# Patient Record
Sex: Female | Born: 1965 | Hispanic: No | Marital: Married | Smoking: Former smoker
Health system: Southern US, Community
[De-identification: ages and names within clinical notes are randomized; demographics above are authoritative.]

## PROBLEM LIST (undated history)

## (undated) DIAGNOSIS — Z21 Asymptomatic human immunodeficiency virus [HIV] infection status: Secondary | ICD-10-CM

## (undated) DIAGNOSIS — R519 Headache, unspecified: Secondary | ICD-10-CM

## (undated) DIAGNOSIS — M2550 Pain in unspecified joint: Secondary | ICD-10-CM

## (undated) DIAGNOSIS — M549 Dorsalgia, unspecified: Secondary | ICD-10-CM

## (undated) DIAGNOSIS — F32A Depression, unspecified: Secondary | ICD-10-CM

## (undated) DIAGNOSIS — T7840XA Allergy, unspecified, initial encounter: Secondary | ICD-10-CM

## (undated) DIAGNOSIS — M1990 Unspecified osteoarthritis, unspecified site: Secondary | ICD-10-CM

## (undated) DIAGNOSIS — R634 Abnormal weight loss: Secondary | ICD-10-CM

## (undated) DIAGNOSIS — B20 Human immunodeficiency virus [HIV] disease: Secondary | ICD-10-CM

## (undated) DIAGNOSIS — I2699 Other pulmonary embolism without acute cor pulmonale: Secondary | ICD-10-CM

## (undated) DIAGNOSIS — R51 Headache: Secondary | ICD-10-CM

## (undated) DIAGNOSIS — R7303 Prediabetes: Secondary | ICD-10-CM

## (undated) DIAGNOSIS — J302 Other seasonal allergic rhinitis: Secondary | ICD-10-CM

## (undated) DIAGNOSIS — F419 Anxiety disorder, unspecified: Secondary | ICD-10-CM

## (undated) DIAGNOSIS — F259 Schizoaffective disorder, unspecified: Secondary | ICD-10-CM

## (undated) DIAGNOSIS — N189 Chronic kidney disease, unspecified: Secondary | ICD-10-CM

## (undated) DIAGNOSIS — R2243 Localized swelling, mass and lump, lower limb, bilateral: Secondary | ICD-10-CM

## (undated) DIAGNOSIS — F329 Major depressive disorder, single episode, unspecified: Secondary | ICD-10-CM

## (undated) DIAGNOSIS — G8929 Other chronic pain: Secondary | ICD-10-CM

## (undated) DIAGNOSIS — I639 Cerebral infarction, unspecified: Secondary | ICD-10-CM

## (undated) HISTORY — DX: Asymptomatic human immunodeficiency virus (hiv) infection status: Z21

## (undated) HISTORY — PX: BREAST SURGERY: SHX581

## (undated) HISTORY — DX: Cerebral infarction, unspecified: I63.9

## (undated) HISTORY — DX: Depression, unspecified: F32.A

## (undated) HISTORY — DX: Localized swelling, mass and lump, lower limb, bilateral: R22.43

## (undated) HISTORY — DX: Pain in unspecified joint: M25.50

## (undated) HISTORY — PX: ABDOMINAL HYSTERECTOMY: SHX81

## (undated) HISTORY — PX: BREAST EXCISIONAL BIOPSY: SUR124

## (undated) HISTORY — DX: Human immunodeficiency virus (HIV) disease: B20

## (undated) HISTORY — DX: Allergy, unspecified, initial encounter: T78.40XA

## (undated) HISTORY — DX: Abnormal weight loss: R63.4

## (undated) HISTORY — DX: Major depressive disorder, single episode, unspecified: F32.9

## (undated) HISTORY — DX: Chronic kidney disease, unspecified: N18.9

## (undated) HISTORY — DX: Anxiety disorder, unspecified: F41.9

---

## 2015-09-19 ENCOUNTER — Other Ambulatory Visit (HOSPITAL_COMMUNITY)
Admission: RE | Admit: 2015-09-19 | Discharge: 2015-09-19 | Disposition: A | Discharge status: Home / Self Care | Payer: Medicaid Other | Source: Ambulatory Visit | Attending: Internal Medicine | Admitting: Internal Medicine

## 2015-09-19 ENCOUNTER — Other Ambulatory Visit: Payer: Medicaid Other

## 2015-09-19 DIAGNOSIS — Z113 Encounter for screening for infections with a predominantly sexual mode of transmission: Secondary | ICD-10-CM | POA: Diagnosis not present

## 2015-09-19 DIAGNOSIS — Z79899 Other long term (current) drug therapy: Secondary | ICD-10-CM

## 2015-09-19 DIAGNOSIS — B20 Human immunodeficiency virus [HIV] disease: Secondary | ICD-10-CM

## 2015-09-19 LAB — CBC WITH DIFFERENTIAL/PLATELET
BASOS ABS: 0 10*3/uL (ref 0.0–0.1)
Basophils Relative: 0 % (ref 0–1)
EOS ABS: 0 10*3/uL (ref 0.0–0.7)
EOS PCT: 1 % (ref 0–5)
HEMATOCRIT: 40.6 % (ref 36.0–46.0)
Hemoglobin: 13.7 g/dL (ref 12.0–15.0)
LYMPHS ABS: 1.7 10*3/uL (ref 0.7–4.0)
LYMPHS PCT: 36 % (ref 12–46)
MCH: 31.3 pg (ref 26.0–34.0)
MCHC: 33.7 g/dL (ref 30.0–36.0)
MCV: 92.7 fL (ref 78.0–100.0)
MONO ABS: 0.4 10*3/uL (ref 0.1–1.0)
MONOS PCT: 8 % (ref 3–12)
MPV: 9.7 fL (ref 8.6–12.4)
Neutro Abs: 2.6 10*3/uL (ref 1.7–7.7)
Neutrophils Relative %: 55 % (ref 43–77)
PLATELETS: 250 10*3/uL (ref 150–400)
RBC: 4.38 MIL/uL (ref 3.87–5.11)
RDW: 13.4 % (ref 11.5–15.5)
WBC: 4.8 10*3/uL (ref 4.0–10.5)

## 2015-09-19 LAB — COMPLETE METABOLIC PANEL WITH GFR
ALT: 12 U/L (ref 6–29)
AST: 13 U/L (ref 10–35)
Albumin: 4.3 g/dL (ref 3.6–5.1)
Alkaline Phosphatase: 49 U/L (ref 33–115)
BUN: 10 mg/dL (ref 7–25)
CALCIUM: 9.4 mg/dL (ref 8.6–10.2)
CHLORIDE: 104 mmol/L (ref 98–110)
CO2: 24 mmol/L (ref 20–31)
CREATININE: 0.99 mg/dL (ref 0.50–1.10)
GFR, EST AFRICAN AMERICAN: 78 mL/min (ref >=60)
GFR, EST NON AFRICAN AMERICAN: 68 mL/min (ref >=60)
Glucose, Bld: 78 mg/dL (ref 65–99)
Potassium: 3.9 mmol/L (ref 3.5–5.3)
Sodium: 141 mmol/L (ref 135–146)
Total Bilirubin: 0.6 mg/dL (ref 0.2–1.2)
Total Protein: 6.9 g/dL (ref 6.1–8.1)

## 2015-09-19 LAB — LIPID PANEL
CHOLESTEROL: 190 mg/dL (ref 125–200)
HDL: 85 mg/dL (ref >=46)
LDL Cholesterol: 88 mg/dL (ref <130)
TRIGLYCERIDES: 84 mg/dL (ref <150)
Total CHOL/HDL Ratio: 2.2 Ratio (ref <=5.0)
VLDL: 17 mg/dL (ref <30)

## 2015-09-20 LAB — URINE CYTOLOGY ANCILLARY ONLY
Chlamydia: NEGATIVE
Neisseria Gonorrhea: NEGATIVE

## 2015-09-20 LAB — T-HELPER CELL (CD4) - (RCID CLINIC ONLY)
CD4 % Helper T Cell: 54 % (ref 33–55)
CD4 T Cell Abs: 980 /uL (ref 400–2700)

## 2015-09-20 LAB — RPR

## 2015-09-21 LAB — QUANTIFERON TB GOLD ASSAY (BLOOD)
Interferon Gamma Release Assay: NEGATIVE
Mitogen-Nil: >10 IU/mL
Quantiferon Nil Value: 0.05 IU/mL
Quantiferon Tb Ag Minus Nil Value: 0 IU/mL

## 2015-09-22 LAB — HIV-1 RNA ULTRAQUANT REFLEX TO GENTYP+
HIV 1 RNA Quant: <20 copies/mL (ref <20)
HIV-1 RNA Quant, Log: <1.3 Log copies/mL (ref <1.30)

## 2015-09-26 LAB — HLA B*5701: HLA-B*5701 w/rflx HLA-B High: NEGATIVE

## 2015-10-03 ENCOUNTER — Encounter: Payer: Self-pay | Admitting: Internal Medicine

## 2015-10-03 ENCOUNTER — Ambulatory Visit (INDEPENDENT_AMBULATORY_CARE_PROVIDER_SITE_OTHER): Payer: Medicaid Other | Admitting: Internal Medicine

## 2015-10-03 VITALS — BP 110/78 | HR 81 | Temp 98.0°F | Ht 67.5 in | Wt 193.0 lb

## 2015-10-03 DIAGNOSIS — B20 Human immunodeficiency virus [HIV] disease: Secondary | ICD-10-CM | POA: Diagnosis not present

## 2015-10-03 DIAGNOSIS — F251 Schizoaffective disorder, depressive type: Secondary | ICD-10-CM | POA: Diagnosis not present

## 2015-10-03 DIAGNOSIS — G894 Chronic pain syndrome: Secondary | ICD-10-CM | POA: Insufficient documentation

## 2015-10-03 DIAGNOSIS — F259 Schizoaffective disorder, unspecified: Secondary | ICD-10-CM | POA: Insufficient documentation

## 2015-10-03 DIAGNOSIS — Z720 Tobacco use: Secondary | ICD-10-CM

## 2015-10-03 DIAGNOSIS — F17210 Nicotine dependence, cigarettes, uncomplicated: Secondary | ICD-10-CM | POA: Insufficient documentation

## 2015-10-03 DIAGNOSIS — F17200 Nicotine dependence, unspecified, uncomplicated: Secondary | ICD-10-CM | POA: Insufficient documentation

## 2015-10-03 MED ORDER — TOPIRAMATE 100 MG PO TABS: 100.0000 mg | ORAL_TABLET | Freq: Every day | ORAL | Status: DC

## 2015-10-03 MED ORDER — DARUNAVIR-COBICISTAT 800-150 MG PO TABS: 1.0000 | ORAL_TABLET | Freq: Every day | ORAL | Status: DC

## 2015-10-03 MED ORDER — EMTRICITABINE-TENOFOVIR AF 200-25 MG PO TABS: 1.0000 | ORAL_TABLET | Freq: Every day | ORAL | Status: DC

## 2015-10-03 MED ORDER — BUPROPION HCL ER (XL) 300 MG PO TB24: 300.0000 mg | ORAL_TABLET | Freq: Every day | ORAL | Status: DC

## 2015-10-03 MED ORDER — QUETIAPINE FUMARATE ER 400 MG PO TB24: 400.0000 mg | ORAL_TABLET | Freq: Every day | ORAL | Status: DC

## 2015-10-03 MED FILL — DESCOVY 200-25 MG TABS: 200-25 | 30 days supply | Qty: 30 | Fill #0

## 2015-10-03 NOTE — Progress Notes (Signed)
Patient ID: Tracy [name], female   DOB: December 16, 1965, 50 y.o.   MRN: [id number]  HPI: Tracy [name] is a 50 y.o. female who is here for her HIV visit after [facility]  Allergies: Allergies  Allergen Reactions  . Tetracyclines & Related Itching  . Tramadol Itching  . Flexeril [Cyclobenzaprine] Rash  . Moxifloxacin Rash    Vitals: Temp: 98 F (36.7 C) (03/23 1345) Temp Source: Oral (03/23 1345) BP: 110/78 mmHg (03/23 1345) Pulse Rate: 81 (03/23 1345)  Past Medical History: Past Medical History  Diagnosis Date  . HIV infection (HCC)   . Depression     Social History: Social History   Social History  . Marital Status: Married    Spouse Name: N/A  . Number of Children: N/A  . Years of Education: N/A   Social History Main Topics  . Smoking status: Current Every Day Smoker -- 0.30 packs/day    Types: Cigarettes  . Smokeless tobacco: Never Used  . Alcohol Use: No     Comment: quit 6.2007  . Drug Use: No     Comment: Previous drug use  . Sexual Activity: Not Asked     Comment: declined condoms    Other Topics Concern  . None   Social History Narrative  . None    Previous Regimen: EPZ, KLT, Trizivir, TDF  Current Regimen: TRV/Prez  Labs: HIV 1 RNA QUANT (copies/mL)  Date Value  09/19/2015 <20   CD4 T CELL ABS (/uL)  Date Value  09/19/2015 980    CrCl: Estimated Creatinine Clearance: 79.8 mL/min (by C-G formula based on Cr of 0.99).  Lipids:    Component Value Date/Time   CHOL 190 09/19/2015 1207   TRIG 84 09/19/2015 1207   HDL 85 09/19/2015 1207   CHOLHDL 2.2 09/19/2015 1207   VLDL 17 09/19/2015 1207   LDLCALC 88 09/19/2015 1207    Assessment: Tracy [name] is here for her initial visit after transferring [facility] care [facility]. She did admit to a hx of non-compliance. 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 She is doing pretty well on the current regimen. After  sitting down with her, we noticed that she is on Seroquel 800mg . This can be boosted severely with the cobicistat. We are going to reduce the dose to 400mg  and keep it there. Secondly, she also has a hx of migraines but she thought that the Topamax was only PRN. Straighten her out about taking it everyday.   Recommendations:  Continue Prezcobix/Descovy Meds profile is clean up Reduce the Seroquel to 400mg  PO qhs Medication calendar  Tracy [name], Tracy [name], PharmD Clinical Infectious Disease Pharmacist [facility] 10/03/2015, 3:11 PM

## 2015-10-04 NOTE — Assessment & Plan Note (Signed)
She is feeling overwhelmed and depressed. She was very interested when I told her that mental health counseling is available in our clinic. She will be sent out to meet with one of our behavioral health counselors.

## 2015-10-04 NOTE — Assessment & Plan Note (Signed)
She expresses that she is currently not ready to quit smoking cigarettes.
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
## 2015-10-04 NOTE — Assessment & Plan Note (Addendum)
Her HIV infection remains under excellent control but she is having difficulty with adherence related to feeling overwhelmed and depressed. She also is having some degree of pill fatigue. I will change Truvada to Descovy and continue Prezcobix. We will change her pharmacy to the [place] outpatient pharmacy mail order service. This will make it easier for her to obtain her medications and also allow us to track her adherence. I had her meet with our ID pharmacist, Ulyses SouthwardMinh Pham for a complete review of her medications with emphasis on any drug drug interactions and unnecessary medications that could be stopped. She will followup with me in 6 weeks. At that time her next blood work she will need to have hepatitis C antibody checked. She is nonimmune to hepatitis B and will need to start a repeat vaccination series. She her husband have not been using condoms. I talked to her about the concern that she could be superinfected with his HIV infection and that could potentially complicate her therapy. She did also become infected with hepatitis C.

## 2015-10-28 MED FILL — DESCOVY 200-25 MG TABS: 200-25 | 30 days supply | Qty: 30 | Fill #1

## 2015-10-28 MED FILL — PREZCOBIX 800 MG-150 MG TAB: 800-150 | 30 days supply | Qty: 30 | Fill #0 | Status: TO

## 2015-11-21 ENCOUNTER — Ambulatory Visit: Payer: Medicaid Other | Admitting: Internal Medicine

## 2015-11-26 MED FILL — DESCOVY 200-25 MG TABS: 200-25 | 30 days supply | Qty: 30 | Fill #2 | Status: TO

## 2015-12-26 ENCOUNTER — Ambulatory Visit: Payer: Medicaid Other | Admitting: *Deleted

## 2015-12-26 ENCOUNTER — Encounter: Payer: Self-pay | Admitting: Internal Medicine

## 2015-12-26 ENCOUNTER — Ambulatory Visit (INDEPENDENT_AMBULATORY_CARE_PROVIDER_SITE_OTHER): Payer: Medicaid Other | Admitting: Internal Medicine

## 2015-12-26 VITALS — BP 118/85 | HR 91 | Temp 99.3°F | Ht 67.5 in | Wt 194.0 lb

## 2015-12-26 DIAGNOSIS — B20 Human immunodeficiency virus [HIV] disease: Secondary | ICD-10-CM | POA: Diagnosis not present

## 2015-12-26 DIAGNOSIS — F251 Schizoaffective disorder, depressive type: Secondary | ICD-10-CM

## 2015-12-26 DIAGNOSIS — L299 Pruritus, unspecified: Secondary | ICD-10-CM

## 2015-12-26 DIAGNOSIS — L298 Other pruritus: Secondary | ICD-10-CM | POA: Insufficient documentation

## 2015-12-26 DIAGNOSIS — T50905A Adverse effect of unspecified drugs, medicaments and biological substances, initial encounter: Principal | ICD-10-CM

## 2015-12-26 MED ORDER — EMTRICITAB-RILPIVIR-TENOFOV DF 200-25-300 MG PO TABS: 1.0000 | ORAL_TABLET | Freq: Every day | ORAL | Status: DC

## 2015-12-26 MED ORDER — DOLUTEGRAVIR SODIUM 50 MG PO TABS: 50.0000 mg | ORAL_TABLET | Freq: Every day | ORAL | Status: DC

## 2015-12-26 MED FILL — TIVICAY 50 MG TABLET: 50 | 30 days supply | Qty: 30 | Fill #0

## 2015-12-26 MED FILL — COMPLERA TABLET: 200-25-300 | 30 days supply | Qty: 30 | Fill #0

## 2015-12-26 NOTE — BH Specialist Note (Signed)
Zariya requested speaking to counselor today.  Counselor met with patient in the exam room for a warm handoff.  Patient was oriented times four with good affect and dress. Patient shared that she is doing a little better but really needed to be speaking to a counselor on a regular basis.  Patient stated that she goes through bout's of depression and she doesn't want to leave her home. Patient indicated that she feels that taking the medications and being HIV positive has affected her emotions/attitude in a negative way. Counselor provided support and encouragement for patient.  Counselor recommended that patient make an appointment when she checks out today.    /[facility] 

## 2015-12-27 LAB — T-HELPER CELL (CD4) - (RCID CLINIC ONLY)
CD4 T CELL HELPER: 54 % (ref 33–55)
CD4 T Cell Abs: 810 /uL (ref 400–2700)

## 2015-12-27 LAB — HIV-1 RNA QUANT-NO REFLEX-BLD
HIV 1 RNA QUANT: 37 {copies}/mL — AB (ref <20)
HIV-1 RNA QUANT, LOG: 1.57 {Log_copies}/mL — AB (ref <1.30)

## 2015-12-27 NOTE — Assessment & Plan Note (Signed)
Her adherence has improved. I will repeat lab work today. It appears that she has developed an allergic reaction with itching after switching to Descovy. This suggests that she is allergic to the TAF component of Descovy. I reviewed the situation with our infectious disease pharmacist. We decided to switch her to Complera and Tivicay. She will follow-up in 6 weeks.

## 2015-12-27 NOTE — Progress Notes (Signed)
Patient Active Problem List   Diagnosis Date Noted  . HIV disease (HCC) 10/03/2015    Priority: High  . Itching due to drug 12/26/2015  . Schizoaffective disorder (HCC) 10/03/2015  . Chronic pain syndrome 10/03/2015  . Cigarette smoker 10/03/2015    Patient's Medications  New Prescriptions   DOLUTEGRAVIR (TIVICAY) 50 MG TABLET    Take 1 tablet (50 mg total) by mouth daily.   DOLUTEGRAVIR (TIVICAY) 50 MG TABLET    Take 1 tablet (50 mg total) by mouth daily.   EMTRICITABINE-RILPIVIR-TENOFOVIR DF (COMPLERA) 200-25-300 MG TABLET    Take 1 tablet by mouth daily.   EMTRICITABINE-RILPIVIR-TENOFOVIR DF (COMPLERA) 200-25-300 MG TABLET    Take 1 tablet by mouth daily.  Previous Medications   BUPROPION (WELLBUTRIN XL) 300 MG 24 HR TABLET    Take 1 tablet (300 mg total) by mouth daily.   LORATADINE (CLARITIN) 10 MG TABLET    Take 10 mg by mouth daily.   MULTIPLE VITAMIN (MULTIVITAMIN) TABLET    Take 1 tablet by mouth daily.   OXYCODONE HCL 10 MG TABS    Take 10 mg by mouth 3 (three) times daily as needed.   QUETIAPINE (SEROQUEL XR) 400 MG 24 HR TABLET    Take 1 tablet (400 mg total) by mouth at bedtime.   SUMATRIPTAN (IMITREX) 100 MG TABLET    Take 100 mg by mouth every 2 (two) hours as needed for migraine. May repeat in 2 hours if headache persists or recurs.   TOPIRAMATE (TOPAMAX) 100 MG TABLET    Take 1 tablet (100 mg total) by mouth daily.  Modified Medications   No medications on file  Discontinued Medications   DARUNAVIR-COBICISTAT (PREZCOBIX) 800-150 MG TABLET    Take 1 tablet by mouth daily. Swallow whole. Do NOT crush, break or chew tablets. Take with food.   EMTRICITABINE-TENOFOVIR AF (DESCOVY) 200-25 MG TABLET    Take 1 tablet by mouth daily.   IPRATROPIUM (ATROVENT HFA) 17 MCG/ACT INHALER    Inhale 2 puffs into the lungs as needed for wheezing.   METHOCARBAMOL (ROBAXIN) 750 MG TABLET    Take 750 mg by mouth daily.    Subjective: Tracy [name] is in with her husband,  Windy Fast, for her routine HIV follow-up visit. She states that she has done much better taking her HIV medications since her last visit 3 months ago. However, after she switched from Truvada 2 Descovy she began to develop diffuse itching. It is most noticeable at night. She is continuing to take her Prezcobix. She states that she is no longer feeling as depressed. She is down to only 2 cigarettes daily but has not thought about quitting completely.  Review of Systems: Review of Systems  Constitutional: Negative for fever, chills, weight loss, malaise/fatigue and diaphoresis.  HENT: Negative for sore throat.   Respiratory: Negative for cough, sputum production and shortness of breath.   Cardiovascular: Negative for chest pain.  Gastrointestinal: Negative for nausea, vomiting, abdominal pain and diarrhea.  Genitourinary: Negative for dysuria.  Musculoskeletal: Negative for myalgias and joint pain.  Skin: Positive for itching. Negative for rash.  Neurological: Negative for dizziness and headaches.  Psychiatric/Behavioral: Negative for depression and substance abuse. The patient is not nervous/anxious.     Past Medical History  Diagnosis Date  . HIV infection (HCC)   . Depression     Social History  Substance Use Topics  . Smoking status: Current Every Day Smoker -- 0.30  packs/day    Types: Cigarettes  . Smokeless tobacco: Never Used     Comment: cutting back  . Alcohol Use: No     Comment: quit 6.2007    Family History  Problem Relation Age of Onset  . Diabetes Mother   . Hypertension Mother     Allergies  Allergen Reactions  . Tetracyclines & Related Itching  . Tramadol Itching  . Flexeril [Cyclobenzaprine] Rash  . Moxifloxacin Rash    Objective:  Filed Vitals:   12/26/15 1101  BP: 118/85  Pulse: 91  Temp: 99.3 F (37.4 C)  TempSrc: Oral  Height: 5' 7.5" (1.715 m)  Weight: 194 lb (87.998 kg)   Body mass index is 29.92 kg/(m^2).  Physical Exam  Constitutional:  She is oriented to person, place, and time.  She is alert and in only mild distress due to itching.  HENT:  Mouth/Throat: No oropharyngeal exudate.  Eyes: Conjunctivae are normal.  Cardiovascular: Normal rate and regular rhythm.   No murmur heard. Pulmonary/Chest: Breath sounds normal.  Abdominal: Soft. There is no tenderness.  Musculoskeletal: Normal range of motion.  Neurological: She is alert and oriented to person, place, and time.  Skin: No rash noted.  Few, minor excoriations.  Psychiatric: Mood and affect normal.    Lab Results Lab Results  Component Value Date   WBC 4.8 09/19/2015   HGB 13.7 09/19/2015   HCT 40.6 09/19/2015   MCV 92.7 09/19/2015   PLT 250 09/19/2015    Lab Results  Component Value Date   CREATININE 0.99 09/19/2015   BUN 10 09/19/2015   NA 141 09/19/2015   K 3.9 09/19/2015   CL 104 09/19/2015   CO2 24 09/19/2015    Lab Results  Component Value Date   ALT 12 09/19/2015   AST 13 09/19/2015   ALKPHOS 49 09/19/2015   BILITOT 0.6 09/19/2015    Lab Results  Component Value Date   CHOL 190 09/19/2015   HDL 85 09/19/2015   LDLCALC 88 09/19/2015   TRIG 84 09/19/2015   CHOLHDL 2.2 09/19/2015    Lab Results HIV 1 RNA QUANT (copies/mL)  Date Value  09/19/2015 <20   CD4 T CELL ABS (/uL)  Date Value  09/19/2015 980      Problem List Items Addressed This Visit      High   HIV disease (HCC)    Her adherence has improved. I will repeat lab work today. It appears that she has developed an allergic reaction with itching after switching to Descovy. This suggests that she is allergic to the TAF component of Descovy. I reviewed the situation with our infectious disease pharmacist. We decided to switch her to Complera and Tivicay. She will follow-up in 6 weeks.      Relevant Medications   dolutegravir (TIVICAY) 50 MG tablet   emtricitabine-rilpivir-tenofovir DF (COMPLERA) 200-25-300 MG tablet   emtricitabine-rilpivir-tenofovir DF (COMPLERA)  200-25-300 MG tablet (Start on 01/20/2016)   dolutegravir (TIVICAY) 50 MG tablet (Start on 01/20/2016)   Other Relevant Orders   T-helper cell (CD4)- ([facility] clinic only)   HIV 1 RNA quant-no reflex-bld     Unprioritized   Itching due to drug - Primary    See above.      Schizoaffective disorder (HCC)    Her depression is improving and this has led to improved adherence with her medications.           Cliffton AstersJohn Yancarlos Berthold, MD [facility]  [phone] pager   [phone] cell 12/27/2015, 10:40 AM

## 2015-12-27 NOTE — Assessment & Plan Note (Signed)
Her depression is improving and this has led to improved adherence with her medications.

## 2015-12-27 NOTE — Assessment & Plan Note (Signed)
See above

## 2016-02-10 ENCOUNTER — Ambulatory Visit (INDEPENDENT_AMBULATORY_CARE_PROVIDER_SITE_OTHER): Payer: Medicaid Other | Admitting: Internal Medicine

## 2016-02-10 DIAGNOSIS — B20 Human immunodeficiency virus [HIV] disease: Secondary | ICD-10-CM | POA: Diagnosis present

## 2016-02-10 DIAGNOSIS — F251 Schizoaffective disorder, depressive type: Secondary | ICD-10-CM

## 2016-02-10 DIAGNOSIS — L853 Xerosis cutis: Secondary | ICD-10-CM | POA: Insufficient documentation

## 2016-02-10 DIAGNOSIS — L850 Acquired ichthyosis: Secondary | ICD-10-CM | POA: Diagnosis not present

## 2016-02-10 NOTE — Assessment & Plan Note (Signed)
She remains depressed but does not want to meet with our behavioral health counselor today. I gave her Lennox Laity Herring's card and encouraged her to consider calling to set up a follow-up appointment.

## 2016-02-10 NOTE — Assessment & Plan Note (Signed)
I asked her to try over-the-counter, low potency steroid cream.

## 2016-02-10 NOTE — Assessment & Plan Note (Signed)
Her infection is under good control with her current regimen. She will follow-up after lab work in 3 months.

## 2016-02-10 NOTE — Progress Notes (Signed)
Patient Active Problem List   Diagnosis Date Noted  . HIV disease (HCC) 10/03/2015    Priority: High  . Dry skin dermatitis 02/10/2016  . Itching due to drug 12/26/2015  . Schizoaffective disorder (HCC) 10/03/2015  . Chronic pain syndrome 10/03/2015  . Cigarette smoker 10/03/2015    Patient's Medications  New Prescriptions   No medications on file  Previous Medications   BUPROPION (WELLBUTRIN XL) 300 MG 24 HR TABLET    Take 1 tablet (300 mg total) by mouth daily.   DOLUTEGRAVIR (TIVICAY) 50 MG TABLET    Take 1 tablet (50 mg total) by mouth daily.   EMTRICITABINE-RILPIVIR-TENOFOVIR DF (COMPLERA) 200-25-300 MG TABLET    Take 1 tablet by mouth daily.   LORATADINE (CLARITIN) 10 MG TABLET    Take 10 mg by mouth daily.   MULTIPLE VITAMIN (MULTIVITAMIN) TABLET    Take 1 tablet by mouth daily.   OXYCODONE HCL 10 MG TABS    Take 10 mg by mouth 3 (three) times daily as needed.   QUETIAPINE (SEROQUEL XR) 400 MG 24 HR TABLET    Take 1 tablet (400 mg total) by mouth at bedtime.   SUMATRIPTAN (IMITREX) 100 MG TABLET    Take 100 mg by mouth every 2 (two) hours as needed for migraine. May repeat in 2 hours if headache persists or recurs.   TOPIRAMATE (TOPAMAX) 100 MG TABLET    Take 1 tablet (100 mg total) by mouth daily.  Modified Medications   No medications on file  Discontinued Medications   DOLUTEGRAVIR (TIVICAY) 50 MG TABLET    Take 1 tablet (50 mg total) by mouth daily.   EMTRICITABINE-RILPIVIR-TENOFOVIR DF (COMPLERA) 200-25-300 MG TABLET    Take 1 tablet by mouth daily.    Subjective: Tracy [name] is in for her routine HIV follow-up visit. Her itching stop promptly after she changed it to her new regimen of Complera and Tivicay. I suspect that her itching and rash were due to TAF. She has not been missing doses of her medications. She has a little bit of dryness, cracking and itching between the webs of her finger and on her hips that is different. She is tired of driving back and  forth to [facility] to see her PCP and would like to get a PCP here in [place]. She tells me that if she is not busy cleaning her house she gets depressed.  Review of Systems: Review of Systems  Constitutional: Negative for chills, diaphoresis, fever, malaise/fatigue and weight loss.  HENT: Negative for sore throat.   Respiratory: Negative for cough, sputum production and shortness of breath.   Cardiovascular: Negative for chest pain.  Gastrointestinal: Negative for diarrhea, nausea and vomiting.  Genitourinary: Negative for dysuria and frequency.  Musculoskeletal: Negative for joint pain and myalgias.  Skin: Positive for itching and rash.  Neurological: Negative for dizziness and headaches.  Psychiatric/Behavioral: Positive for depression. Negative for substance abuse. The patient is not nervous/anxious.     Past Medical History:  Diagnosis Date  . Depression   . HIV infection [facility])     Social History  Substance Use Topics  . Smoking status: Current Every Day Smoker    Packs/day: 0.30    Types: Cigarettes  . Smokeless tobacco: Never Used     Comment: cutting back  . Alcohol use No     Comment: quit 6.2007    Family History  Problem Relation Age of Onset  . Diabetes  Mother   . Hypertension Mother     Allergies  Allergen Reactions  . Tetracyclines & Related Itching  . Tramadol Itching  . Flexeril [Cyclobenzaprine] Rash  . Moxifloxacin Rash    Objective:  Vitals:   02/10/16 1450  BP: 111/79  Pulse: 85  Temp: 98.3 F (36.8 C)  TempSrc: Oral  Weight: 196 lb (88.9 kg)  Height: 5' 7.5" (1.715 m)   Body mass index is 30.24 kg/m.  Physical Exam  Constitutional: She is oriented to person, place, and time.  She is in good spirits.  HENT:  Mouth/Throat: No oropharyngeal exudate.  Eyes: Conjunctivae are normal.  Cardiovascular: Normal rate and regular rhythm.   No murmur heard. Pulmonary/Chest: Breath sounds normal.  Abdominal: Soft. She exhibits no mass.  There is no tenderness.  Musculoskeletal: Normal range of motion.  Neurological: She is alert and oriented to person, place, and time.  Skin: Rash noted.  She has some dry patches in the webspace between several fingers on both hands.  Psychiatric: Mood and affect normal.    Lab Results Lab Results  Component Value Date   WBC 4.8 09/19/2015   HGB 13.7 09/19/2015   HCT 40.6 09/19/2015   MCV 92.7 09/19/2015   PLT 250 09/19/2015    Lab Results  Component Value Date   CREATININE 0.99 09/19/2015   BUN 10 09/19/2015   NA 141 09/19/2015   K 3.9 09/19/2015   CL 104 09/19/2015   CO2 24 09/19/2015    Lab Results  Component Value Date   ALT 12 09/19/2015   AST 13 09/19/2015   ALKPHOS 49 09/19/2015   BILITOT 0.6 09/19/2015    Lab Results  Component Value Date   CHOL 190 09/19/2015   HDL 85 09/19/2015   LDLCALC 88 09/19/2015   TRIG 84 09/19/2015   CHOLHDL 2.2 09/19/2015   HIV 1 RNA Quant (copies/mL)  Date Value  12/26/2015 37 (H)  09/19/2015 <20   CD4 T Cell Abs (/uL)  Date Value  12/26/2015 810  09/19/2015 980     Problem List Items Addressed This Visit      High   HIV disease (HCC)    Her infection is under good control with her current regimen. She will follow-up after lab work in 3 months.      Relevant Orders   T-helper cell (CD4)- ([facility] clinic only)   HIV 1 RNA quant-no reflex-bld     Unprioritized   Dry skin dermatitis    I asked her to try over-the-counter, low potency steroid cream.      Schizoaffective disorder (HCC)    She remains depressed but does not want to meet with our behavioral health counselor today. I gave her Lennox Laity Herring's card and encouraged her to consider calling to set up a follow-up appointment.       Other Visit Diagnoses   None.       Cliffton Asters, MD [facility] [phone] pager   [phone] cell 02/10/2016, 5:23 PM

## 2016-05-03 ENCOUNTER — Emergency Department (HOSPITAL_COMMUNITY)
Admission: EM | Admit: 2016-05-03 | Discharge: 2016-05-03 | Disposition: A | Discharge status: Home / Self Care | Payer: Medicaid Other | Attending: Emergency Medicine | Admitting: Emergency Medicine

## 2016-05-03 ENCOUNTER — Encounter (HOSPITAL_COMMUNITY): Payer: Self-pay

## 2016-05-03 DIAGNOSIS — F17210 Nicotine dependence, cigarettes, uncomplicated: Secondary | ICD-10-CM | POA: Diagnosis not present

## 2016-05-03 DIAGNOSIS — L02211 Cutaneous abscess of abdominal wall: Secondary | ICD-10-CM | POA: Insufficient documentation

## 2016-05-03 MED ORDER — SULFAMETHOXAZOLE-TRIMETHOPRIM 800-160 MG PO TABS
1.0000 | ORAL_TABLET | Freq: Once | ORAL | Status: AC
  Administered 2016-05-03: 1 via ORAL
  Filled 2016-05-03: qty 1

## 2016-05-03 MED ORDER — ACETAMINOPHEN 500 MG PO TABS
1000.0000 mg | ORAL_TABLET | Freq: Once | ORAL | Status: AC
  Administered 2016-05-03: 1000 mg via ORAL
  Filled 2016-05-03: qty 2

## 2016-05-03 MED ORDER — LIDOCAINE-EPINEPHRINE 1 %-1:100000 IJ SOLN
10.0000 mL | Freq: Once | INTRAMUSCULAR | Status: AC
  Administered 2016-05-03: 10 mL via INTRADERMAL
  Filled 2016-05-03: qty 1

## 2016-05-03 MED ORDER — SULFAMETHOXAZOLE-TRIMETHOPRIM 800-160 MG PO TABS: 1.0000 | ORAL_TABLET | Freq: Two times a day (BID) | ORAL | 0 refills | Status: AC

## 2016-05-03 NOTE — ED Provider Notes (Signed)
[facility]-EMERGENCY DEPT Provider Note   CSN: [id number] Arrival date & time: 05/03/16  1218     History   Chief Complaint Chief Complaint  Patient presents with  . abscess to abdomen    HPI Tracy [name] is a 50 y.o. female.  The history is provided by the patient.  Abscess  Location:  Torso Torso abscess location:  Abd LLQ Size:  4 cm Abscess quality: fluctuance, induration, painful, redness and warmth   Abscess quality: not draining   Red streaking: yes   Duration:  1 week Progression:  Worsening Pain details:    Quality:  Throbbing   Severity:  Severe   Timing:  Constant   Progression:  Worsening Chronicity:  Recurrent Context: immunosuppression   Relieved by:  Nothing Exacerbated by: touch. Associated symptoms: no fever, no nausea and no vomiting   Risk factors: hx of MRSA and prior abscess    H/o HIV on HAART; last CD4 810 in June. H/o MRSA.  Past Medical History:  Diagnosis Date  . Depression   . HIV infection [facility])     Patient Active Problem List   Diagnosis Date Noted  . Dry skin dermatitis 02/10/2016  . Itching due to drug 12/26/2015  . HIV disease (HCC) 10/03/2015  . Schizoaffective disorder (HCC) 10/03/2015  . Chronic pain syndrome 10/03/2015  . Cigarette smoker 10/03/2015    Past Surgical History:  Procedure Laterality Date  . ABDOMINAL HYSTERECTOMY    . BREAST SURGERY      OB History    No data available       Home Medications    Prior to Admission medications   Medication Sig Start Date End Date Taking? Authorizing Provider  buPROPion (WELLBUTRIN XL) 300 MG 24 hr tablet Take 1 tablet (300 mg total) by mouth daily. 10/03/15  Yes Cliffton Asters, MD  dolutegravir (TIVICAY) 50 MG tablet Take 1 tablet (50 mg total) by mouth daily. 01/20/16  Yes Cliffton Asters, MD  emtricitabine-rilpivir-tenofovir DF (COMPLERA) 200-25-300 MG tablet Take 1 tablet by mouth daily. 12/26/15  Yes Cliffton Asters, MD  loratadine (CLARITIN) 10 MG tablet Take 10 mg  by mouth daily.   Yes Historical Provider, MD  Multiple Vitamin (MULTIVITAMIN) tablet Take 1 tablet by mouth daily.   Yes Historical Provider, MD  Oxycodone HCl 10 MG TABS Take 10 mg by mouth 3 (three) times daily as needed.   Yes Historical Provider, MD  QUEtiapine (SEROQUEL XR) 400 MG 24 hr tablet Take 1 tablet (400 mg total) by mouth at bedtime. 10/03/15  Yes Cliffton Asters, MD  topiramate (TOPAMAX) 100 MG tablet Take 1 tablet (100 mg total) by mouth daily. 10/03/15  Yes Cliffton Asters, MD  sulfamethoxazole-trimethoprim (BACTRIM DS,SEPTRA DS) 800-160 MG tablet Take 1 tablet by mouth 2 (two) times daily. 05/03/16 05/08/16  Nira Conn, MD  SUMAtriptan (IMITREX) 100 MG tablet Take 100 mg by mouth every 2 (two) hours as needed for migraine. May repeat in 2 hours if headache persists or recurs.    Historical Provider, MD    Family History Family History  Problem Relation Age of Onset  . Diabetes Mother   . Hypertension Mother     Social History Social History  Substance Use Topics  . Smoking status: Current Every Day Smoker    Packs/day: 0.30    Types: Cigarettes  . Smokeless tobacco: Never Used     Comment: cutting back  . Alcohol use No     Comment: quit 6.2007  Allergies   Tetracyclines & related; Tramadol; Flexeril [cyclobenzaprine]; and Moxifloxacin   Review of Systems Review of Systems  Constitutional: Negative for chills and fever.  HENT: Negative for ear pain and sore throat.   Eyes: Negative for pain and visual disturbance.  Respiratory: Negative for cough and shortness of breath.   Cardiovascular: Negative for chest pain and palpitations.  Gastrointestinal: Negative for abdominal pain, nausea and vomiting.  Genitourinary: Negative for dysuria and hematuria.  Musculoskeletal: Negative for arthralgias and back pain.  Skin: Negative for color change and rash.  Neurological: Negative for seizures and syncope.  All other systems reviewed and are  negative.    Physical Exam Updated Vital Signs BP 119/75   Pulse 87   Temp 98.3 F (36.8 C) (Oral)   Resp 22   SpO2 98%   Physical Exam  Constitutional: She is oriented to person, place, and time. She appears well-developed and well-nourished. No distress.  HENT:  Head: Normocephalic and atraumatic.  Right Ear: External ear normal.  Left Ear: External ear normal.  Nose: Nose normal.  Eyes: Conjunctivae and EOM are normal. No scleral icterus.  Neck: Normal range of motion and phonation normal.  Cardiovascular: Normal rate and regular rhythm.   Pulmonary/Chest: Effort normal. No stridor. No respiratory distress.  Abdominal: She exhibits no distension.    Musculoskeletal: Normal range of motion. She exhibits no edema.  Neurological: She is alert and oriented to person, place, and time.  Skin: She is not diaphoretic.  Psychiatric: She has a normal mood and affect. Her behavior is normal.  Vitals reviewed.    ED Treatments / Results  Labs (all labs ordered are listed, but only abnormal results are displayed) Labs Reviewed - No data to display  EKG  EKG Interpretation None       Radiology No results found.  Procedures .Marland KitchenIncision and Drainage Date/Time: 05/03/2016 3:26 PM Performed by: Nira Conn Authorized by: Nira Conn   Consent:    Consent obtained:  Verbal   Risks discussed:  Bleeding, incomplete drainage, pain and infection   Alternatives discussed:  No treatment Location:    Type:  Abscess   Size:  2 cm   Location:  Trunk   Trunk location:  Abdomen Pre-procedure details:    Skin preparation:  Betadine Anesthesia (see MAR for exact dosages):    Anesthesia method:  Local infiltration   Local anesthetic:  Lidocaine 1% WITH epi Procedure type:    Complexity:  Complex Procedure details:    Needle aspiration: no     Incision type: cruciate.   Incision depth:  Subcutaneous   Scalpel blade:  11   Wound management:  Probed  and deloculated   Drainage:  Purulent   Drainage amount:  Moderate   Wound treatment:  Wound left open   Packing materials:  1/4 in gauze and 1/4 in iodoform gauze Post-procedure details:    Patient tolerance of procedure:  Tolerated well, no immediate complications   (including critical care time) Emergency Focused Ultrasound Exam Limited Ultrasound of Soft Tissue   Performed and interpreted by Dr. Eudelia Bunch Indication: evaluation for infection or foreign body Transverse and Sagittal views of left Abd wall are obtained in real time for the purposes of evaluation of skin and underlying soft tissues.  Findings: + heterogeneous fluid collection, + hyperemia/edema of surrounding tissue Interpretation: abscess with overlying cellulitis  Images archived electronically.  CPT Codes:  Abdominal wall [address]-60   Medications Ordered in ED Medications  lidocaine-EPINEPHrine (XYLOCAINE  W/EPI) 1 %-1:100000 (with pres) injection 10 mL (not administered)  acetaminophen (TYLENOL) tablet 1,000 mg (1,000 mg Oral Given 05/03/16 1257)  sulfamethoxazole-trimethoprim (BACTRIM DS,SEPTRA DS) 800-160 MG per tablet 1 tablet (1 tablet Oral Given 05/03/16 1332)     Initial Impression / Assessment and Plan / ED Course  I have reviewed the triage vital signs and the nursing notes.  Pertinent labs & imaging results that were available during my care of the patient were reviewed by me and considered in my medical decision making (see chart for details).  Clinical Course    Abd wall abscess with overlying cellulitis with h/o MRSA and well controlled HIV. Incision and drainage as above. Packing placed. Patient given Bactrim with a history of MRSA. Safe for discharge with strict return precautions. We'll prescribe Bactrim.  Final Clinical Impressions(s) / ED Diagnoses   Final diagnoses:  Abdominal wall abscess   Disposition: Discharge  Condition: Good  I have discussed the results, Dx and Tx plan with  the patient who expressed understanding and agree(s) with the plan. Discharge instructions discussed at great length. The patient was given strict return precautions who verbalized understanding of the instructions. No further questions at time of discharge.    New Prescriptions   SULFAMETHOXAZOLE-TRIMETHOPRIM (BACTRIM DS,SEPTRA DS) 800-160 MG TABLET    Take 1 tablet by mouth 2 (two) times daily.    [facility] [address] [place] [id number] [phone] Call  For help establishing care with a care provider  [facility] [address] [id number] [place] [phone] Go in 2 days For wound re-check      Nira ConnPedro Eduardo Savannaha Stonerock, MD 05/03/16 1530

## 2016-05-03 NOTE — ED Triage Notes (Signed)
Patient here with to LLQ of abdomen x 1 week. Pain and redness without drainage, hx of same. Denies fever.

## 2016-05-25 ENCOUNTER — Encounter: Payer: Self-pay | Admitting: Internal Medicine

## 2016-05-25 ENCOUNTER — Ambulatory Visit (INDEPENDENT_AMBULATORY_CARE_PROVIDER_SITE_OTHER): Payer: Medicaid Other | Admitting: Internal Medicine

## 2016-05-25 VITALS — BP 116/78 | HR 88 | Temp 98.4°F | Ht 67.5 in | Wt 194.5 lb

## 2016-05-25 DIAGNOSIS — N644 Mastodynia: Secondary | ICD-10-CM | POA: Diagnosis not present

## 2016-05-25 DIAGNOSIS — Z23 Encounter for immunization: Secondary | ICD-10-CM

## 2016-05-25 DIAGNOSIS — B20 Human immunodeficiency virus [HIV] disease: Secondary | ICD-10-CM

## 2016-05-25 LAB — CBC
HCT: 37.5 % (ref 35.0–45.0)
Hemoglobin: 12.6 g/dL (ref 11.7–15.5)
MCH: 31.7 pg (ref 27.0–33.0)
MCHC: 33.6 g/dL (ref 32.0–36.0)
MCV: 94.2 fL (ref 80.0–100.0)
MPV: 9.6 fL (ref 7.5–12.5)
PLATELETS: 263 10*3/uL (ref 140–400)
RBC: 3.98 MIL/uL (ref 3.80–5.10)
RDW: 14.3 % (ref 11.0–15.0)
WBC: 5.4 10*3/uL (ref 3.8–10.8)

## 2016-05-25 LAB — COMPREHENSIVE METABOLIC PANEL
ALT: 12 U/L (ref 6–29)
AST: 14 U/L (ref 10–35)
Albumin: 4.3 g/dL (ref 3.6–5.1)
Alkaline Phosphatase: 58 U/L (ref 33–115)
BUN: 12 mg/dL (ref 7–25)
CALCIUM: 9.3 mg/dL (ref 8.6–10.2)
CHLORIDE: 115 mmol/L — AB (ref 98–110)
CO2: 22 mmol/L (ref 20–31)
Creat: 1.15 mg/dL — ABNORMAL HIGH (ref 0.50–1.10)
GLUCOSE: 81 mg/dL (ref 65–99)
POTASSIUM: 4.1 mmol/L (ref 3.5–5.3)
Sodium: 142 mmol/L (ref 135–146)
Total Bilirubin: 0.6 mg/dL (ref 0.2–1.2)
Total Protein: 6.8 g/dL (ref 6.1–8.1)

## 2016-05-25 NOTE — Assessment & Plan Note (Signed)
Her depression is currently in remission. 

## 2016-05-25 NOTE — Progress Notes (Signed)
Patient Active Problem List   Diagnosis Date Noted  . HIV disease (HCC) 10/03/2015    Priority: High  . Breast pain, left 05/25/2016  . Dry skin dermatitis 02/10/2016  . Itching due to drug 12/26/2015  . Schizoaffective disorder (HCC) 10/03/2015  . Chronic pain syndrome 10/03/2015  . Cigarette smoker 10/03/2015    Patient's Medications  New Prescriptions   No medications on file  Previous Medications   BUPROPION (WELLBUTRIN XL) 300 MG 24 HR TABLET    Take 1 tablet (300 mg total) by mouth daily.   DOLUTEGRAVIR (TIVICAY) 50 MG TABLET    Take 1 tablet (50 mg total) by mouth daily.   EMTRICITABINE-RILPIVIR-TENOFOVIR DF (COMPLERA) 200-25-300 MG TABLET    Take 1 tablet by mouth daily.   FERROUS SULFATE 325 (65 FE) MG TABLET    Take 325 mg by mouth daily with breakfast.   LORATADINE (CLARITIN) 10 MG TABLET    Take 10 mg by mouth daily.   MULTIPLE VITAMIN (MULTIVITAMIN) TABLET    Take 1 tablet by mouth daily.   OXYCODONE HCL 10 MG TABS    Take 10 mg by mouth 3 (three) times daily as needed.   QUETIAPINE (SEROQUEL XR) 400 MG 24 HR TABLET    Take 1 tablet (400 mg total) by mouth at bedtime.   SUMATRIPTAN (IMITREX) 100 MG TABLET    Take 100 mg by mouth every 2 (two) hours as needed for migraine. May repeat in 2 hours if headache persists or recurs.   TOPIRAMATE (TOPAMAX) 100 MG TABLET    Take 1 tablet (100 mg total) by mouth daily.  Modified Medications   No medications on file  Discontinued Medications   No medications on file    Subjective: Tracy [name] is in for her routine HIV follow-up visit. She has had no problems obtaining, taking or tolerating her Complera and Tivicay. Her rash resolved after she started this regimen. She stopped eating meat several months ago and has more energy and is feeling better. She has had a little bit of tenderness in her left breast recently. She has not felt any lumps. She has had previous surgery on her right breast with 2 biopsies revealing  benign lesions. She has not had a mammogram in about 6 years. She recently had a boil lanced and was treated with trimethoprim sulfamethoxazole for several days. That has resolved. She is not feeling depressed currently.   Review of Systems: Review of Systems  Constitutional: Negative for chills, diaphoresis, fever, malaise/fatigue and weight loss.  HENT: Negative for sore throat.   Respiratory: Negative for cough, sputum production and shortness of breath.   Cardiovascular: Negative for chest pain.  Gastrointestinal: Negative for diarrhea, nausea and vomiting.  Genitourinary: Negative for dysuria and frequency.  Musculoskeletal: Negative for joint pain and myalgias.  Skin: Negative for rash.  Neurological: Negative for dizziness and headaches.  Psychiatric/Behavioral: Negative for depression and substance abuse. The patient is not nervous/anxious.     Past Medical History:  Diagnosis Date  . Depression   . HIV infection [facility](HCC)     Social History  Substance Use Topics  . Smoking status: Former Smoker    Packs/day: 0.30    Types: Cigarettes    Quit date: 04/26/2016  . Smokeless tobacco: Never Used     Comment: cutting back  . Alcohol use No     Comment: quit 6.2007    Family History  Problem Relation Age of  Onset  . Diabetes Mother   . Hypertension Mother     Allergies  Allergen Reactions  . Tetracyclines & Related Itching  . Tramadol Itching  . Flexeril [Cyclobenzaprine] Rash  . Moxifloxacin Rash    Objective:  Vitals:   05/25/16 1521  BP: 116/78  Pulse: 88  Temp: 98.4 F (36.9 C)  TempSrc: Oral  Weight: 194 lb 8 oz (88.2 kg)  Height: 5' 7.5" (1.715 m)   Body mass index is 30.01 kg/m.  Physical Exam  Constitutional: She is oriented to person, place, and time.  She is in good spirits.  HENT:  Mouth/Throat: No oropharyngeal exudate.  Eyes: Conjunctivae are normal.  Cardiovascular: Normal rate and regular rhythm.   No murmur heard. Pulmonary/Chest:  Effort normal and breath sounds normal.    Abdominal: Soft. She exhibits no mass. There is no tenderness.  Musculoskeletal: Normal range of motion.  Neurological: She is alert and oriented to person, place, and time.  Skin: No rash noted.  Psychiatric: Mood and affect normal.    Lab Results Lab Results  Component Value Date   WBC 4.8 09/19/2015   HGB 13.7 09/19/2015   HCT 40.6 09/19/2015   MCV 92.7 09/19/2015   PLT 250 09/19/2015    Lab Results  Component Value Date   CREATININE 0.99 09/19/2015   BUN 10 09/19/2015   NA 141 09/19/2015   K 3.9 09/19/2015   CL 104 09/19/2015   CO2 24 09/19/2015    Lab Results  Component Value Date   ALT 12 09/19/2015   AST 13 09/19/2015   ALKPHOS 49 09/19/2015   BILITOT 0.6 09/19/2015    Lab Results  Component Value Date   CHOL 190 09/19/2015   HDL 85 09/19/2015   LDLCALC 88 09/19/2015   TRIG 84 09/19/2015   CHOLHDL 2.2 09/19/2015   HIV 1 RNA Quant (copies/mL)  Date Value  12/26/2015 37 (H)  09/19/2015 <20   CD4 T Cell Abs (/uL)  Date Value  12/26/2015 810  09/19/2015 980     Problem List Items Addressed This Visit      High   HIV disease (HCC)    Her adherence is excellent and her infection has been under good control. She is tolerating her current regimen and I will continue it. She will get repeat lab work today and pneumococcal vaccine.      Relevant Orders   T-helper cell (CD4)- ([facility] clinic only)   HIV 1 RNA quant-no reflex-bld   CBC   Comprehensive metabolic panel     Unprioritized   Breast pain, left    I do not see any cause for her recent breast discomfort. We will schedule her for a mammogram.       Other Visit Diagnoses    Need for prophylactic vaccination against Streptococcus pneumoniae (pneumococcus)    -  Primary   Relevant Orders   Pneumococcal polysaccharide vaccine 23-valent greater than or equal to 2yo subcutaneous/IM   Human immunodeficiency virus I infection (HCC)       Relevant Orders     Pneumococcal polysaccharide vaccine 23-valent greater than or equal to 2yo subcutaneous/IM        Cliffton AstersJohn Donell Sliwinski, MD [facility] [phone] pager   [phone] cell 05/25/2016, 3:51 PM

## 2016-05-25 NOTE — Assessment & Plan Note (Signed)
I do not see any cause for her recent breast discomfort. We will schedule her for a mammogram.

## 2016-05-25 NOTE — Assessment & Plan Note (Signed)
Her adherence is excellent and her infection has been under good control. She is tolerating her current regimen and I will continue it. She will get repeat lab work today and pneumococcal vaccine.

## 2016-05-25 NOTE — Addendum Note (Signed)
Addended by: Jennet MaduroESTRIDGE, DENISE D on: 05/25/2016 05:42 PM   Modules accepted: Orders

## 2016-05-26 NOTE — Addendum Note (Signed)
Addended by: Jennet MaduroESTRIDGE, Issiah Huffaker D on: 05/26/2016 10:26 AM   Modules accepted: Orders

## 2016-05-27 LAB — T-HELPER CELL (CD4) - (RCID CLINIC ONLY)
CD4 T CELL ABS: 1160 /uL (ref 400–2700)
CD4 T CELL HELPER: 51 % (ref 33–55)

## 2016-05-28 LAB — HIV-1 RNA QUANT-NO REFLEX-BLD: HIV-1 RNA Quant, Log: <1.3 Log copies/mL (ref <1.30)

## 2016-05-31 ENCOUNTER — Emergency Department (HOSPITAL_COMMUNITY)
Admission: EM | Admit: 2016-05-31 | Discharge: 2016-05-31 | Disposition: A | Discharge status: Home / Self Care | Payer: Medicaid Other | Attending: Physician Assistant | Admitting: Physician Assistant

## 2016-05-31 ENCOUNTER — Encounter (HOSPITAL_COMMUNITY): Payer: Self-pay

## 2016-05-31 DIAGNOSIS — Z79899 Other long term (current) drug therapy: Secondary | ICD-10-CM | POA: Diagnosis not present

## 2016-05-31 DIAGNOSIS — F17210 Nicotine dependence, cigarettes, uncomplicated: Secondary | ICD-10-CM | POA: Diagnosis not present

## 2016-05-31 DIAGNOSIS — L02211 Cutaneous abscess of abdominal wall: Secondary | ICD-10-CM | POA: Diagnosis not present

## 2016-05-31 DIAGNOSIS — L0889 Other specified local infections of the skin and subcutaneous tissue: Secondary | ICD-10-CM | POA: Diagnosis present

## 2016-05-31 MED ORDER — OXYCODONE HCL 5 MG PO TABS
10.0000 mg | ORAL_TABLET | Freq: Once | ORAL | Status: AC
  Administered 2016-05-31: 10 mg via ORAL
  Filled 2016-05-31: qty 2

## 2016-05-31 MED ORDER — SULFAMETHOXAZOLE-TRIMETHOPRIM 800-160 MG PO TABS: 1.0000 | ORAL_TABLET | Freq: Two times a day (BID) | ORAL | 0 refills | Status: AC

## 2016-05-31 MED ORDER — CEPHALEXIN 500 MG PO CAPS: 500.0000 mg | ORAL_CAPSULE | Freq: Four times a day (QID) | ORAL | 0 refills | Status: DC

## 2016-05-31 MED ORDER — LIDOCAINE-EPINEPHRINE (PF) 2 %-1:200000 IJ SOLN
10.0000 mL | Freq: Once | INTRAMUSCULAR | Status: AC
  Administered 2016-05-31: 10 mL
  Filled 2016-05-31: qty 20

## 2016-05-31 NOTE — ED Provider Notes (Signed)
[facility]-EMERGENCY DEPT Provider Note   CSN: [id number] Arrival date & time: 05/31/16  1818     History   Chief Complaint Chief Complaint  Patient presents with  . Wound Infection    HPI Tracy [name] is a 50 y.o. female with a hx of HIV on HAART, chronic pain syndrome, schizoaffective disorder presents to the Emergency Department complaining of gradual, persistent, progressively worsening LLQ abd pain with drainage from the site onset 2 days ago. Patient reports associated pain and swelling at the site along with erythema. Purulent drainage since this morning. Patient reports this is the same site that required an incision and drainage one month ago. She reports complete healing in between with close follow-up with her infectious disease doctor.  Patient denies fever, chills, nausea, vomiting, abdominal pain.  She does endorse for bouts of watery diarrhea today. No abdominal pain or fevers. No hematochezia or melena. No history of C. difficile.   The history is provided by the patient and medical records. No language interpreter was used.    Past Medical History:  Diagnosis Date  . Depression   . HIV infection [facility](HCC)     Patient Active Problem List   Diagnosis Date Noted  . Breast pain, left 05/25/2016  . Dry skin dermatitis 02/10/2016  . Itching due to drug 12/26/2015  . HIV disease (HCC) 10/03/2015  . Schizoaffective disorder (HCC) 10/03/2015  . Chronic pain syndrome 10/03/2015  . Cigarette smoker 10/03/2015    Past Surgical History:  Procedure Laterality Date  . ABDOMINAL HYSTERECTOMY    . BREAST SURGERY      OB History    No data available       Home Medications    Prior to Admission medications   Medication Sig Start Date End Date Taking? Authorizing Provider  buPROPion (WELLBUTRIN XL) 300 MG 24 hr tablet Take 1 tablet (300 mg total) by mouth daily. 10/03/15   Cliffton AstersJohn Campbell, MD  cephALEXin (KEFLEX) 500 MG capsule Take 1 capsule (500 mg total) by mouth 4  (four) times daily. 05/31/16   Nanami Whitelaw, PA-C  dolutegravir (TIVICAY) 50 MG tablet Take 1 tablet (50 mg total) by mouth daily. 01/20/16   Cliffton AstersJohn Campbell, MD  emtricitabine-rilpivir-tenofovir DF (COMPLERA) 200-25-300 MG tablet Take 1 tablet by mouth daily. 12/26/15   Cliffton AstersJohn Campbell, MD  ferrous sulfate 325 (65 FE) MG tablet Take 325 mg by mouth daily with breakfast.    Historical Provider, MD  loratadine (CLARITIN) 10 MG tablet Take 10 mg by mouth daily.    Historical Provider, MD  Multiple Vitamin (MULTIVITAMIN) tablet Take 1 tablet by mouth daily.    Historical Provider, MD  Oxycodone HCl 10 MG TABS Take 10 mg by mouth 3 (three) times daily as needed.    Historical Provider, MD  QUEtiapine (SEROQUEL XR) 400 MG 24 hr tablet Take 1 tablet (400 mg total) by mouth at bedtime. 10/03/15   Cliffton AstersJohn Campbell, MD  sulfamethoxazole-trimethoprim (BACTRIM DS,SEPTRA DS) 800-160 MG tablet Take 1 tablet by mouth 2 (two) times daily. 05/31/16 06/07/16  Jaymen Fetch, PA-C  SUMAtriptan (IMITREX) 100 MG tablet Take 100 mg by mouth every 2 (two) hours as needed for migraine. May repeat in 2 hours if headache persists or recurs.    Historical Provider, MD  topiramate (TOPAMAX) 100 MG tablet Take 1 tablet (100 mg total) by mouth daily. 10/03/15   Cliffton AstersJohn Campbell, MD    Family History Family History  Problem Relation Age of Onset  . Diabetes Mother   .  Hypertension Mother     Social History Social History  Substance Use Topics  . Smoking status: Current Some Day Smoker    Packs/day: 0.30    Types: Cigarettes    Last attempt to quit: 04/26/2016  . Smokeless tobacco: Never Used     Comment: cutting back  . Alcohol use No     Comment: quit 6.2007     Allergies   Tetracyclines & related; Tramadol; Flexeril [cyclobenzaprine]; and Moxifloxacin   Review of Systems Review of Systems  Gastrointestinal: Positive for diarrhea.  Skin: Positive for wound.  All other systems reviewed and are  negative.    Physical Exam Updated Vital Signs BP 134/89   Pulse 88   Temp 98.2 F (36.8 C) (Oral)   Resp 16   Ht 5' 7.5" (1.715 m)   Wt 89.5 kg   SpO2 99%   BMI 30.46 kg/m   Physical Exam  Constitutional: She is oriented to person, place, and time. She appears well-developed and well-nourished. No distress.  HENT:  Head: Normocephalic and atraumatic.  Eyes: Conjunctivae are normal. No scleral icterus.  Neck: Normal range of motion.  Cardiovascular: Normal rate, regular rhythm and intact distal pulses.   Pulmonary/Chest: Effort normal and breath sounds normal.  Abdominal: Soft. She exhibits no distension. There is no tenderness.  5x5cm area of erythema and induration to the LLQ of the abd with purulent drainage.    Lymphadenopathy:    She has no cervical adenopathy.  Neurological: She is alert and oriented to person, place, and time.  Skin: Skin is warm and dry. She is not diaphoretic. There is erythema.  Psychiatric: She has a normal mood and affect.  Nursing note and vitals reviewed.    ED Treatments / Results  Labs (all labs ordered are listed, but only abnormal results are displayed) Labs Reviewed - No data to display  EKG  EKG Interpretation None       Radiology No results found.  Procedures .Marland Kitchen.Incision and Drainage Date/Time: 05/31/2016 11:19 PM Performed by: Dierdre ForthMUTHERSBAUGH, Olsen Mccutchan Authorized by: Dierdre ForthMUTHERSBAUGH, Aarya Robinson   Consent:    Consent obtained:  Verbal   Consent given by:  Patient   Risks discussed:  Bleeding, incomplete drainage and pain   Alternatives discussed:  No treatment (or abx alone) Location:    Type:  Abscess   Location:  Trunk   Trunk location:  Abdomen Pre-procedure details:    Skin preparation:  Betadine Anesthesia (see MAR for exact dosages):    Anesthesia method:  Local infiltration   Local anesthetic:  Lidocaine 2% WITH epi (5mL) Procedure type:    Complexity:  Simple Procedure details:    Needle aspiration: no      Incision types:  Single straight   Incision depth:  Dermal   Scalpel blade:  11   Wound management:  Probed and deloculated and irrigated with saline   Drainage:  Bloody and purulent   Drainage amount:  Moderate   Wound treatment:  Wound left open   Packing materials:  None Post-procedure details:    Patient tolerance of procedure:  Tolerated well, no immediate complications   (including critical care time)  EMERGENCY DEPARTMENT US SOFT TISSUE INTERPRETATION "Study: Limited Ultrasound of the noted body part in comments below"  INDICATIONS: Pain and Soft tissue infection Multiple views of the body part are obtained with a multi-frequency linear probe  PERFORMED BY:  Myself  IMAGES ARCHIVED?: Yes  SIDE:Left  BODY PART:Abdominal wall  FINDINGS: Abcess present and Cellulitis  present  LIMITATIONS:  Body Habitus  INTERPRETATION:  Abcess present and Cellulitis present  COMMENT:  Small abscess noted with cobblestoning of surrounding tissue.    Medications Ordered in ED Medications  lidocaine-EPINEPHrine (XYLOCAINE W/EPI) 2 %-1:200000 (PF) injection 10 mL (10 mLs Infiltration Given 05/31/16 2219)  oxyCODONE (Oxy IR/ROXICODONE) immediate release tablet 10 mg (10 mg Oral Given 05/31/16 2218)     Initial Impression / Assessment and Plan / ED Course  I have reviewed the triage vital signs and the nursing notes.  Pertinent labs & imaging results that were available during my care of the patient were reviewed by me and considered in my medical decision making (see chart for details).  Clinical Course as of Jun 01 2331  Wynelle Link May 31, 2016  2110 H/o HIV on HAART; last CD4 1,160 in November.  HIV Quant < 20 at that time. H/o MRSA.  [HM]    Clinical Course User Index [HM] Dierdre Forth, PA-C   Patient with skin abscess amenable to incision and drainage.  Abscess was not large enough to warrant packing or drain,  wound recheck in 2 days. Encouraged home warm soaks and  flushing.  Mild signs of cellulitis is surrounding skin.  Will d/c to home.  Patient with complete resolution on Bactrim. Will discharge with Bactrim and Keflex. She is to have 48 hour follow-up with PCP for wound check. Discussed reasons to return to the emergency department including fevers chills or abdominal pain.  Discussed with patient the importance of close follow-up for her diarrhea as well as. She is alert and oriented. Well hydrated. Doubt C. difficile at this time. Asked patient to discuss with her primary care/infectious disease physician if symptoms persist for the next several days. Her abdomen is soft and nontender aside from the site where the abscess is located.  Final Clinical Impressions(s) / ED Diagnoses   Final diagnoses:  Abdominal wall abscess    New Prescriptions Discharge Medication List as of 05/31/2016 11:02 PM    START taking these medications   Details  cephALEXin (KEFLEX) 500 MG capsule Take 1 capsule (500 mg total) by mouth 4 (four) times daily., Starting Sun 05/31/2016, Print    sulfamethoxazole-trimethoprim (BACTRIM DS,SEPTRA DS) 800-160 MG tablet Take 1 tablet by mouth 2 (two) times daily., Starting Sun 05/31/2016, Until Sun 06/07/2016, Print         Clarice Bonaventure, PA-C 05/31/16 2332    Courteney Randall An, MD 05/31/16 6045

## 2016-05-31 NOTE — ED Notes (Signed)
EDP at bedside  

## 2016-05-31 NOTE — ED Notes (Signed)
Pt verbalized understanding discharge instructions and denies any further needs or questions at this time. VS stable, ambulatory and steady gait.   

## 2016-05-31 NOTE — ED Notes (Addendum)
Pt had an abrasion on the LLQ of her abdomen. Pt states she had it drained last month here in this ED.  Pt states the MD gave her antibiotics which she finished and was told to keep the wound covered and let it heal. Pt states she has been fine up until two days ago. Pt c/o 10/10 pain is requesting her "percocet". She gets the narcotic for chronic abdominal pain.

## 2016-05-31 NOTE — Discharge Instructions (Signed)
1. Medications: bactrim, keflex, usual home medications 2. Treatment: rest, drink plenty of fluids, use warm compresses, flush abscess with warm water several times per day 3. Follow Up: Please followup with your primary doctor in 2 days for discussion of your diagnoses and further evaluation after today's visit; if you do not have a primary care doctor use the resource guide provided to find one; Please return to the ER for fevers, chills, nausea, vomiting or other signs of infection

## 2016-05-31 NOTE — ED Triage Notes (Addendum)
Pt had abdominal wall abscess in Oct 2017.  Onset 2 days ago wound has reddened with bloody drainage.  No fever.  Diarrhea x 2 days, watery, x 4 today.   Sore throat x 2 days.

## 2016-06-15 ENCOUNTER — Other Ambulatory Visit: Payer: Self-pay | Admitting: Internal Medicine

## 2016-06-16 ENCOUNTER — Telehealth: Payer: Self-pay | Admitting: *Deleted

## 2016-06-16 NOTE — Telephone Encounter (Signed)
I have not been prescribing these medications. [facility] Please ask her who has been prescribing these. [facility] see if [facility] can be [facility]. I would be willing to give her a 3 month supply of those medicines if there is a gap in her care.

## 2016-06-16 NOTE — Telephone Encounter (Signed)
Refill requests of seroquel 400mg  and welbutrin 300mg   Please advise if you are managing these medications. Andree CossHowell, Apollonia Amini M, RN

## 2016-06-17 NOTE — Telephone Encounter (Signed)
Called PCP's office to forward the refill request.

## 2016-06-22 ENCOUNTER — Other Ambulatory Visit: Payer: Medicaid Other

## 2016-07-09 ENCOUNTER — Telehealth: Payer: Self-pay | Admitting: Lab

## 2016-07-09 NOTE — Telephone Encounter (Signed)
Patient was scheduled to have both done on 06/22/16-Both appointments were cancelled

## 2016-07-14 NOTE — Telephone Encounter (Signed)
Do you know who canceled the procedures? Could just see if they can be rescheduled? Thanks.

## 2016-08-20 NOTE — Telephone Encounter (Signed)
Yes and She was a NO SHOW.

## 2016-10-01 NOTE — Addendum Note (Signed)
Addended by: Jennet MaduroESTRIDGE, DENISE D on: 10/01/2016 04:58 PM   Modules accepted: Orders

## 2016-10-02 ENCOUNTER — Other Ambulatory Visit: Payer: Medicaid Other

## 2016-10-05 ENCOUNTER — Ambulatory Visit
Admission: RE | Admit: 2016-10-05 | Discharge: 2016-10-05 | Disposition: A | Discharge status: Home / Self Care | Payer: Medicaid Other | Source: Ambulatory Visit | Attending: Internal Medicine | Admitting: Internal Medicine

## 2016-10-05 DIAGNOSIS — N644 Mastodynia: Secondary | ICD-10-CM

## 2016-12-11 ENCOUNTER — Other Ambulatory Visit: Payer: Self-pay | Admitting: Internal Medicine

## 2016-12-17 ENCOUNTER — Ambulatory Visit: Payer: Medicaid Other | Admitting: Internal Medicine

## 2016-12-29 ENCOUNTER — Emergency Department (HOSPITAL_COMMUNITY): Payer: Medicaid Other

## 2016-12-29 ENCOUNTER — Inpatient Hospital Stay (HOSPITAL_COMMUNITY)
Admission: EM | Admit: 2016-12-29 | Discharge: 2016-12-31 | DRG: 880 | Disposition: A | Discharge status: Home / Self Care | Payer: Medicaid Other | Attending: Internal Medicine | Admitting: Internal Medicine

## 2016-12-29 ENCOUNTER — Encounter (HOSPITAL_COMMUNITY): Payer: Self-pay

## 2016-12-29 ENCOUNTER — Inpatient Hospital Stay (HOSPITAL_COMMUNITY): Payer: Medicaid Other

## 2016-12-29 DIAGNOSIS — F17210 Nicotine dependence, cigarettes, uncomplicated: Secondary | ICD-10-CM | POA: Diagnosis present

## 2016-12-29 DIAGNOSIS — R51 Headache: Secondary | ICD-10-CM

## 2016-12-29 DIAGNOSIS — E669 Obesity, unspecified: Secondary | ICD-10-CM | POA: Diagnosis present

## 2016-12-29 DIAGNOSIS — I663 Occlusion and stenosis of cerebellar arteries: Secondary | ICD-10-CM | POA: Diagnosis present

## 2016-12-29 DIAGNOSIS — M5412 Radiculopathy, cervical region: Secondary | ICD-10-CM

## 2016-12-29 DIAGNOSIS — Z792 Long term (current) use of antibiotics: Secondary | ICD-10-CM

## 2016-12-29 DIAGNOSIS — F449 Dissociative and conversion disorder, unspecified: Secondary | ICD-10-CM

## 2016-12-29 DIAGNOSIS — G894 Chronic pain syndrome: Secondary | ICD-10-CM | POA: Diagnosis present

## 2016-12-29 DIAGNOSIS — F259 Schizoaffective disorder, unspecified: Secondary | ICD-10-CM | POA: Diagnosis present

## 2016-12-29 DIAGNOSIS — I639 Cerebral infarction, unspecified: Secondary | ICD-10-CM | POA: Diagnosis present

## 2016-12-29 DIAGNOSIS — Z79899 Other long term (current) drug therapy: Secondary | ICD-10-CM

## 2016-12-29 DIAGNOSIS — B20 Human immunodeficiency virus [HIV] disease: Secondary | ICD-10-CM | POA: Diagnosis present

## 2016-12-29 DIAGNOSIS — R519 Headache, unspecified: Secondary | ICD-10-CM

## 2016-12-29 DIAGNOSIS — Z6830 Body mass index (BMI) 30.0-30.9, adult: Secondary | ICD-10-CM | POA: Diagnosis not present

## 2016-12-29 DIAGNOSIS — R531 Weakness: Secondary | ICD-10-CM | POA: Diagnosis present

## 2016-12-29 DIAGNOSIS — R4701 Aphasia: Secondary | ICD-10-CM | POA: Diagnosis present

## 2016-12-29 DIAGNOSIS — F17200 Nicotine dependence, unspecified, uncomplicated: Secondary | ICD-10-CM | POA: Diagnosis present

## 2016-12-29 DIAGNOSIS — I360 Nonrheumatic tricuspid (valve) stenosis: Secondary | ICD-10-CM | POA: Diagnosis not present

## 2016-12-29 DIAGNOSIS — G44021 Chronic cluster headache, intractable: Secondary | ICD-10-CM | POA: Diagnosis not present

## 2016-12-29 HISTORY — DX: Dorsalgia, unspecified: M54.9

## 2016-12-29 HISTORY — DX: Headache: R51

## 2016-12-29 HISTORY — DX: Headache, unspecified: R51.9

## 2016-12-29 HISTORY — DX: Other chronic pain: G89.29

## 2016-12-29 HISTORY — DX: Schizoaffective disorder, unspecified: F25.9

## 2016-12-29 LAB — COMPREHENSIVE METABOLIC PANEL
ALT: 17 U/L (ref 14–54)
ANION GAP: 9 (ref 5–15)
AST: 22 U/L (ref 15–41)
Albumin: 3.8 g/dL (ref 3.5–5.0)
Alkaline Phosphatase: 63 U/L (ref 38–126)
BUN: 7 mg/dL (ref 6–20)
CHLORIDE: 108 mmol/L (ref 101–111)
CO2: 22 mmol/L (ref 22–32)
Calcium: 9.1 mg/dL (ref 8.9–10.3)
Creatinine, Ser: 1.08 mg/dL — ABNORMAL HIGH (ref 0.44–1.00)
GFR, EST NON AFRICAN AMERICAN: 59 mL/min — AB (ref >60)
Glucose, Bld: 109 mg/dL — ABNORMAL HIGH (ref 65–99)
POTASSIUM: 3.8 mmol/L (ref 3.5–5.1)
SODIUM: 139 mmol/L (ref 135–145)
Total Bilirubin: 0.6 mg/dL (ref 0.3–1.2)
Total Protein: 6.3 g/dL — ABNORMAL LOW (ref 6.5–8.1)

## 2016-12-29 LAB — DIFFERENTIAL
BASOS PCT: 0 %
Basophils Absolute: 0 10*3/uL (ref 0.0–0.1)
EOS ABS: 0.1 10*3/uL (ref 0.0–0.7)
Eosinophils Relative: 1 %
Lymphocytes Relative: 31 %
Lymphs Abs: 1.9 10*3/uL (ref 0.7–4.0)
MONO ABS: 0.5 10*3/uL (ref 0.1–1.0)
MONOS PCT: 9 %
NEUTROS ABS: 3.6 10*3/uL (ref 1.7–7.7)
Neutrophils Relative %: 59 %

## 2016-12-29 LAB — I-STAT CHEM 8, ED
BUN: 7 mg/dL (ref 6–20)
CALCIUM ION: 1.09 mmol/L — AB (ref 1.15–1.40)
CHLORIDE: 106 mmol/L (ref 101–111)
Creatinine, Ser: 1 mg/dL (ref 0.44–1.00)
Glucose, Bld: 105 mg/dL — ABNORMAL HIGH (ref 65–99)
HCT: 39 % (ref 36.0–46.0)
HEMOGLOBIN: 13.3 g/dL (ref 12.0–15.0)
POTASSIUM: 3.8 mmol/L (ref 3.5–5.1)
SODIUM: 141 mmol/L (ref 135–145)
TCO2: 22 mmol/L (ref 0–100)

## 2016-12-29 LAB — URINALYSIS, ROUTINE W REFLEX MICROSCOPIC
BILIRUBIN URINE: NEGATIVE
Glucose, UA: NEGATIVE mg/dL
HGB URINE DIPSTICK: NEGATIVE
Ketones, ur: NEGATIVE mg/dL
Leukocytes, UA: NEGATIVE
Nitrite: NEGATIVE
PH: 7 (ref 5.0–8.0)
Protein, ur: NEGATIVE mg/dL
SPECIFIC GRAVITY, URINE: 1.024 (ref 1.005–1.030)

## 2016-12-29 LAB — CBG MONITORING, ED: Glucose-Capillary: 98 mg/dL (ref 65–99)

## 2016-12-29 LAB — PROTIME-INR
INR: 0.96
Prothrombin Time: 12.8 seconds (ref 11.4–15.2)

## 2016-12-29 LAB — CBC
HEMATOCRIT: 40.2 % (ref 36.0–46.0)
Hemoglobin: 13.3 g/dL (ref 12.0–15.0)
MCH: 31.4 pg (ref 26.0–34.0)
MCHC: 33.1 g/dL (ref 30.0–36.0)
MCV: 95 fL (ref 78.0–100.0)
PLATELETS: 228 10*3/uL (ref 150–400)
RBC: 4.23 MIL/uL (ref 3.87–5.11)
RDW: 13.1 % (ref 11.5–15.5)
WBC: 6.1 10*3/uL (ref 4.0–10.5)

## 2016-12-29 LAB — RAPID URINE DRUG SCREEN, HOSP PERFORMED
AMPHETAMINES: NOT DETECTED
BENZODIAZEPINES: NOT DETECTED
Barbiturates: NOT DETECTED
COCAINE: NOT DETECTED
OPIATES: NOT DETECTED
Tetrahydrocannabinol: NOT DETECTED

## 2016-12-29 LAB — I-STAT TROPONIN, ED: TROPONIN I, POC: 0 ng/mL (ref 0.00–0.08)

## 2016-12-29 LAB — ETHANOL

## 2016-12-29 LAB — APTT: APTT: 30 s (ref 24–36)

## 2016-12-29 MED ORDER — OXYCODONE HCL 10 MG PO TABS
10.0000 mg | ORAL_TABLET | Freq: Three times a day (TID) | ORAL | Status: DC | PRN
Start: 2016-12-29 — End: 2016-12-29

## 2016-12-29 MED ORDER — METOCLOPRAMIDE HCL 5 MG/ML IJ SOLN
10.0000 mg | Freq: Once | INTRAMUSCULAR | Status: AC
  Administered 2016-12-29: 10 mg via INTRAVENOUS
  Filled 2016-12-29: qty 2

## 2016-12-29 MED ORDER — DOLUTEGRAVIR SODIUM 50 MG PO TABS
50.0000 mg | ORAL_TABLET | Freq: Every day | ORAL | Status: DC
  Administered 2016-12-29 – 2016-12-31 (×3): 50 mg via ORAL
  Filled 2016-12-29 (×4): qty 1

## 2016-12-29 MED ORDER — FERROUS SULFATE 325 (65 FE) MG PO TABS
325.0000 mg | ORAL_TABLET | Freq: Every day | ORAL | Status: DC
  Administered 2016-12-30 – 2016-12-31 (×2): 325 mg via ORAL
  Filled 2016-12-29 (×2): qty 1

## 2016-12-29 MED ORDER — HEPARIN SODIUM (PORCINE) 5000 UNIT/ML IJ SOLN
5000.0000 [IU] | Freq: Three times a day (TID) | INTRAMUSCULAR | Status: DC
  Administered 2016-12-29 – 2016-12-31 (×5): 5000 [IU] via SUBCUTANEOUS
  Filled 2016-12-29 (×5): qty 1

## 2016-12-29 MED ORDER — BUPROPION HCL ER (XL) 150 MG PO TB24
300.0000 mg | ORAL_TABLET | Freq: Every day | ORAL | Status: DC
  Administered 2016-12-30 – 2016-12-31 (×2): 300 mg via ORAL
  Filled 2016-12-29 (×3): qty 2

## 2016-12-29 MED ORDER — LORATADINE 10 MG PO TABS
10.0000 mg | ORAL_TABLET | Freq: Every day | ORAL | Status: DC
  Administered 2016-12-29 – 2016-12-31 (×3): 10 mg via ORAL
  Filled 2016-12-29 (×3): qty 1

## 2016-12-29 MED ORDER — ASPIRIN 325 MG PO TABS
325.0000 mg | ORAL_TABLET | Freq: Every day | ORAL | Status: DC
  Administered 2016-12-29 – 2016-12-30 (×2): 325 mg via ORAL
  Filled 2016-12-29 (×2): qty 1

## 2016-12-29 MED ORDER — NICOTINE 7 MG/24HR TD PT24
7.0000 mg | MEDICATED_PATCH | Freq: Every day | TRANSDERMAL | Status: DC | PRN
  Administered 2016-12-29 – 2016-12-31 (×3): 7 mg via TRANSDERMAL
  Filled 2016-12-29 (×3): qty 1

## 2016-12-29 MED ORDER — ACETAMINOPHEN 650 MG RE SUPP
650.0000 mg | Freq: Four times a day (QID) | RECTAL | Status: DC | PRN
Start: 2016-12-29 — End: 2016-12-31

## 2016-12-29 MED ORDER — IOPAMIDOL (ISOVUE-370) INJECTION 76%
INTRAVENOUS | Status: AC
  Administered 2016-12-29: 90 mL via INTRAVENOUS
  Filled 2016-12-29: qty 100

## 2016-12-29 MED ORDER — LORAZEPAM 2 MG/ML IJ SOLN: 0.5000 mg | Freq: Four times a day (QID) | INTRAMUSCULAR | Status: DC | PRN

## 2016-12-29 MED ORDER — ASPIRIN 300 MG RE SUPP: 300.0000 mg | Freq: Every day | RECTAL | Status: DC

## 2016-12-29 MED ORDER — STROKE: EARLY STAGES OF RECOVERY BOOK
Freq: Once | Status: AC
  Administered 2016-12-29: 22:00:00
  Filled 2016-12-29 (×2): qty 1

## 2016-12-29 MED ORDER — ACETAMINOPHEN 500 MG PO TABS
1000.0000 mg | ORAL_TABLET | Freq: Four times a day (QID) | ORAL | Status: DC | PRN
  Administered 2016-12-30: 1000 mg via ORAL
  Filled 2016-12-29: qty 2

## 2016-12-29 MED ORDER — EMTRICITAB-RILPIVIR-TENOFOV AF 200-25-25 MG PO TABS
1.0000 | ORAL_TABLET | Freq: Every day | ORAL | Status: DC
  Administered 2016-12-29 – 2016-12-31 (×3): 1 via ORAL
  Filled 2016-12-29 (×3): qty 1

## 2016-12-29 MED ORDER — SODIUM CHLORIDE 0.45 % IV SOLN
INTRAVENOUS | Status: DC
  Administered 2016-12-29: 17:00:00 via INTRAVENOUS

## 2016-12-29 MED ORDER — QUETIAPINE FUMARATE ER 400 MG PO TB24
400.0000 mg | ORAL_TABLET | Freq: Every day | ORAL | Status: DC
  Administered 2016-12-29 – 2016-12-30 (×2): 400 mg via ORAL
  Filled 2016-12-29 (×2): qty 1

## 2016-12-29 MED ORDER — TOPIRAMATE 25 MG PO TABS
100.0000 mg | ORAL_TABLET | Freq: Every day | ORAL | Status: DC
  Administered 2016-12-29 – 2016-12-31 (×3): 100 mg via ORAL
  Filled 2016-12-29 (×3): qty 4

## 2016-12-29 MED ORDER — OXYCODONE HCL 5 MG PO TABS
10.0000 mg | ORAL_TABLET | ORAL | Status: DC | PRN
  Administered 2016-12-29 – 2016-12-31 (×7): 10 mg via ORAL
  Filled 2016-12-29 (×7): qty 2

## 2016-12-29 NOTE — ED Triage Notes (Signed)
Pt brought in for left sided weakness, facial droop, and aphagia. LSN 5 p.m. Yesterday. Symptoms were discovered at 7 a.m. This morning. Pt a&ox4.

## 2016-12-29 NOTE — H&P (Signed)
History and Physical    Tracy KaufmannYavohnda [name] ZOX:[id number] DOB: [birth year] DOA: 12/29/2016  PCP: System, Pcp Not In Patient coming from: home  Chief Complaint: L sided weakness  HPI: Tracy KaufmannYavohnda [name] is a 51 y.o. female with medical history significant of Depression, HIV. Patient is followed at a [facility] primary care office but gets her HIV treatment by Dr. Orvan Falconerampbell. Patient states that she developed sudden onset left upper extremity left lower extremity weakness and numbness. Occurred at approximately 08:30. EMS was called to evaluate the patient and [facility]. While in route patient developed left facial weakness, droop and slurring of speech. Currently patient feels that her symptoms are unchanged from onset and are constant. Patient states an associated headache which is the same as her typical headache which she has chronically. Denies any chest pain, palpitations, nausea, vomiting, abdominal pain, dysuria, frequency, fevers, neck stiffness. Patient endorses continuation of smoking approximately 2-3 cigarettes per day. Denies alcohol or illicit drug use. Patient has never had symptoms like this before. Patient endorses compliance with her HIV medications. Of note pt does describe intermittent LUE pain over the past week. Worse w/ certain movements.   ED Course: nothing given. Objective findings below  Review of Systems: As per HPI otherwise all other systems reviewed and are negative  Ambulatory Status:no restrictions prior to onset of sx.   Past Medical History:  Diagnosis Date  . Chronic back pain   . Chronic headaches   . Depression   . HIV infection (HCC)   . Schizoaffective disorder [facility](HCC)     Past Surgical History:  Procedure Laterality Date  . ABDOMINAL HYSTERECTOMY    . BREAST EXCISIONAL BIOPSY Right 1990's  . BREAST SURGERY      Social History   Social History  . Marital status: Married    Spouse name: N/A  . Number of children: N/A  . Years of  education: N/A   Occupational History  . Not on file.   Social History Main Topics  . Smoking status: Current Some Day Smoker    Packs/day: 0.30    Types: Cigarettes    Last attempt to quit: 04/26/2016  . Smokeless tobacco: Never Used     Comment: cutting back  . Alcohol use No     Comment: quit 6.2007  . Drug use: No     Comment: Previous drug use  . Sexual activity: Yes    Partners: Male     Comment: declined condoms    Other Topics Concern  . Not on file   Social History Narrative  . No narrative on file    Allergies  Allergen Reactions  . Tetracyclines & Related Itching  . Tramadol Itching  . Flexeril [Cyclobenzaprine] Rash  . Moxifloxacin Rash    Family History  Problem Relation Age of Onset  . Diabetes Mother   . Hypertension Mother   . Breast cancer Maternal Aunt       Prior to Admission medications   Medication Sig Start Date End Date Taking? Authorizing Provider  buPROPion (WELLBUTRIN XL) 300 MG 24 hr tablet Take 1 tablet (300 mg total) by mouth daily. 10/03/15  Yes Cliffton Astersampbell, John, MD  dolutegravir (TIVICAY) 50 MG tablet Take 1 tablet (50 mg total) by mouth daily. 01/20/16  Yes Cliffton Astersampbell, John, MD  emtricitabine-rilpivir-tenofovir DF (COMPLERA) 200-25-300 MG tablet Take 1 tablet by mouth daily. 12/26/15  Yes Cliffton Astersampbell, John, MD  ferrous sulfate 325 (65 FE) MG tablet Take 325 mg by mouth daily  with breakfast.   Yes [provider]  loratadine (CLARITIN) 10 MG tablet Take 10 mg by mouth daily.   Yes [provider]  Multiple Vitamin (MULTIVITAMIN) tablet Take 1 tablet by mouth daily.   Yes [provider]  Oxycodone HCl 10 MG TABS Take 10 mg by mouth 3 (three) times daily as needed.   Yes [provider]  QUEtiapine (SEROQUEL XR) 400 MG 24 hr tablet Take 1 tablet (400 mg total) by mouth at bedtime. 10/03/15  Yes Cliffton Asters, MD  SUMAtriptan (IMITREX) 100 MG tablet Take 100 mg by mouth every 2 (two) hours as needed for  migraine. May repeat in 2 hours if headache persists or recurs.   Yes [provider]  topiramate (TOPAMAX) 100 MG tablet Take 1 tablet (100 mg total) by mouth daily. 10/03/15  Yes Cliffton Asters, MD  cephALEXin (KEFLEX) 500 MG capsule Take 1 capsule (500 mg total) by mouth 4 (four) times daily. Patient not taking: Reported on 12/29/2016 05/31/16   Muthersbaugh, Dahlia Client, PA-C    Physical Exam: Vitals:   12/29/16 1233 12/29/16 1245 12/29/16 1300 12/29/16 1315  BP: (!) 138/98 139/90  (!) 142/91  Pulse: 80 71 72 79  Resp: 12 14 16 19   Temp:      TempSrc:      SpO2: 93%  93%   Weight:      Height:         General:  Appears calm and comfortable Eyes:  PERRL, EOMI, normal lids, iris ENT:  grossly normal hearing, lips & tongue, mmm Neck:  no LAD, masses or thyromegaly Cardiovascular:  RRR, no m/r/g. No LE edema. No carotid bruits Respiratory:  CTA bilaterally, no w/r/r. Normal respiratory effort. Abdomen:  soft, ntnd, NABS Skin:  no rash or induration seen on limited exam Musculoskeletal: LUE and LLE weakness w/ limited ROM. RUE and RLE FROM. No bony abnormality Psychiatric:  grossly normal mood and affect, speech fluent and appropriate, AOx3 Neurologic: No appreciable facial droop at time of my examination. Left-sided numbness in a V2 V3 distribution. Left upper extremity with 3 out of 5 grip strength but 4 out of 5 upper extremity flexion. Right upper extremity 5 out of 5 grip strength and 5 out of 5 flexion. Left lower extremity 2/5 hip flexion. Right lower extremity 5 out of 5 hip flexion  Labs on Admission: I have personally reviewed following labs and imaging studies  CBC:  Recent Labs Lab 12/29/16 0954 12/29/16 1003  WBC 6.1  --   NEUTROABS 3.6  --   HGB 13.3 13.3  HCT 40.2 39.0  MCV 95.0  --   PLT 228  --    Basic Metabolic Panel:  Recent Labs Lab 12/29/16 0954 12/29/16 1003  NA 139 141  K 3.8 3.8  CL 108 106  CO2 22  --   GLUCOSE 109* 105*  BUN 7 7    CREATININE 1.08* 1.00  CALCIUM 9.1  --    GFR: Estimated Creatinine Clearance: 77.1 mL/min (by C-G formula based on SCr of 1 mg/dL). Liver Function Tests:  Recent Labs Lab 12/29/16 0954  AST 22  ALT 17  ALKPHOS 63  BILITOT 0.6  PROT 6.3*  ALBUMIN 3.8   No results for input(s): LIPASE, AMYLASE in the last 168 hours. No results for input(s): AMMONIA in the last 168 hours. Coagulation Profile:  Recent Labs Lab 12/29/16 0954  INR 0.96   Cardiac Enzymes: No results for input(s): CKTOTAL, CKMB, CKMBINDEX, TROPONINI  in the last 168 hours. BNP (last 3 results) No results for input(s): PROBNP in the last 8760 hours. HbA1C: No results for input(s): HGBA1C in the last 72 hours. CBG:  Recent Labs Lab 12/29/16 1040  GLUCAP 98   Lipid Profile: No results for input(s): CHOL, HDL, LDLCALC, TRIG, CHOLHDL, LDLDIRECT in the last 72 hours. Thyroid Function Tests: No results for input(s): TSH, T4TOTAL, FREET4, T3FREE, THYROIDAB in the last 72 hours. Anemia Panel: No results for input(s): VITAMINB12, FOLATE, FERRITIN, TIBC, IRON, RETICCTPCT in the last 72 hours. Urine analysis:    Component Value Date/Time   COLORURINE STRAW (A) 12/29/2016 1133   APPEARANCEUR CLEAR 12/29/2016 1133   LABSPEC 1.024 12/29/2016 1133   PHURINE 7.0 12/29/2016 1133   GLUCOSEU NEGATIVE 12/29/2016 1133   HGBUR NEGATIVE 12/29/2016 1133   BILIRUBINUR NEGATIVE 12/29/2016 1133   KETONESUR NEGATIVE 12/29/2016 1133   PROTEINUR NEGATIVE 12/29/2016 1133   NITRITE NEGATIVE 12/29/2016 1133   LEUKOCYTESUR NEGATIVE 12/29/2016 1133    Creatinine Clearance: Estimated Creatinine Clearance: 77.1 mL/min (by C-G formula based on SCr of 1 mg/dL).  Sepsis Labs: @LABRCNTIP (procalcitonin:4,lacticidven:4) )No results found for this or any previous visit (from the past 240 hour(s)).   Radiological Exams on Admission: Ct Angio Head W Or Wo Contrast  Result Date: 12/29/2016 CLINICAL DATA:  51 year old female code  stroke presentation. Left side neurologic deficits. Last seen normal approximately 1700 hours yesterday. EXAM: CT ANGIOGRAPHY HEAD AND NECK CT PERFUSION BRAIN TECHNIQUE: Multidetector CT imaging of the head and neck was performed using the standard protocol during bolus administration of intravenous contrast. Multiplanar CT image reconstructions and MIPs were obtained to evaluate the vascular anatomy. Carotid stenosis measurements (when applicable) are obtained utilizing NASCET criteria, using the distal internal carotid diameter as the denominator. Multiphase CT imaging of the brain was performed following IV bolus contrast injection. Subsequent parametric perfusion maps were calculated using RAPID software. CONTRAST:  90 mL Isovue 370 COMPARISON:  Noncontrast head CT 1016 hours today. FINDINGS: CT Brain Perfusion Findings: Mild motion artifact which was subtracted automatically during the venous phase of the exam. CBF (<30%) Volume:  0 mL Perfusion (Tmax>6.0s) volume: 16 mL, located along the anterior right MCA division including inferior frontal lobe and some of the right temporal lobe. Mismatch Volume:  Not applicable Infarction Location: No core infarct suggested by CT perfusion. CTA NECK Skeleton: No acute osseous abnormality identified. Upper chest: Dependent opacity in the visible lungs compatible with atelectasis. No superior mediastinal lymphadenopathy. Other neck: Within normal limits, no cervical lymphadenopathy. Aortic arch: 3 vessel arch configuration. No arch or great vessel origin atherosclerosis. Right carotid system: Negative right CCA aside from mild tortuosity. Normal right carotid bifurcation. Negative cervical right ICA. Left carotid system: Mild tortuosity of the proximal left CCA. Normal left carotid bifurcation. Negative cervical left ICA. Vertebral arteries: No proximal subclavian artery plaque or stenosis. Mildly tortuous proximal left subclavian. Dominant left vertebral artery. Normal  vertebral artery origins. Tortuous left V1 segment. No vertebral artery atherosclerosis or stenosis identified in the neck. CTA HEAD Posterior circulation: Dominant distal left vertebral artery functionally supplies the basilar. Normal PICA origins. No basilar stenosis. Normal SCA and PCA origins. Diminutive posterior communicating arteries. Normal PCA branches. Anterior circulation: Both ICA siphons are normal. Patent carotid termini. Normal ophthalmic and posterior communicating artery origins. Normal MCA and ACA origins. Anterior communicating artery and bilateral ACA branches are within normal limits. Left MCA M1 segment, bifurcation, and left MCA branches appear within normal limits. The right MCA M1  segment is patent. A relatively early right MCA bifurcation occurs. Along the anterior superior aspect of this bifurcation there appears to be a truncated right MCA artery best seen on series 12, image 73 and series 10, image 123. It is unclear whether this is an M2 or proximal M3 branch. Otherwise, the right MCA branches appear normal. Venous sinuses: Patent. Anatomic variants: Dominant left vertebral artery. Delayed phase: Stable gray-white matter differentiation throughout the brain. No cortically based acute infarct identified. No abnormal enhancement identified. Review of the MIP images confirms the above findings IMPRESSION: 1. Suspected acute arterial occlusion along the anterior division of the right MCA, but it is unclear whether this is a smaller M2 or a proximal M3 branch. 2. CT perfusion indicates a small volume of anterior division right MCA territory penumbra (16 mL) concordant with #1, but is negative for evidence of core infarct at this time. 3. Otherwise negative CTA head and neck arterial findings; no atherosclerosis or other arterial abnormality identified. 4. Stable CT appearance of the brain since 1016 hours today. 5. This study was reviewed in person with Dr. Ritta Slot between 1035  and 1045 hours. Electronically Signed   By: Odessa Fleming M.D.   On: 12/29/2016 11:07   Ct Angio Neck W And/or Wo Contrast  Result Date: 12/29/2016 CLINICAL DATA:  51 year old female code stroke presentation. Left side neurologic deficits. Last seen normal approximately 1700 hours yesterday. EXAM: CT ANGIOGRAPHY HEAD AND NECK CT PERFUSION BRAIN TECHNIQUE: Multidetector CT imaging of the head and neck was performed using the standard protocol during bolus administration of intravenous contrast. Multiplanar CT image reconstructions and MIPs were obtained to evaluate the vascular anatomy. Carotid stenosis measurements (when applicable) are obtained utilizing NASCET criteria, using the distal internal carotid diameter as the denominator. Multiphase CT imaging of the brain was performed following IV bolus contrast injection. Subsequent parametric perfusion maps were calculated using RAPID software. CONTRAST:  90 mL Isovue 370 COMPARISON:  Noncontrast head CT 1016 hours today. FINDINGS: CT Brain Perfusion Findings: Mild motion artifact which was subtracted automatically during the venous phase of the exam. CBF (<30%) Volume:  0 mL Perfusion (Tmax>6.0s) volume: 16 mL, located along the anterior right MCA division including inferior frontal lobe and some of the right temporal lobe. Mismatch Volume:  Not applicable Infarction Location: No core infarct suggested by CT perfusion. CTA NECK Skeleton: No acute osseous abnormality identified. Upper chest: Dependent opacity in the visible lungs compatible with atelectasis. No superior mediastinal lymphadenopathy. Other neck: Within normal limits, no cervical lymphadenopathy. Aortic arch: 3 vessel arch configuration. No arch or great vessel origin atherosclerosis. Right carotid system: Negative right CCA aside from mild tortuosity. Normal right carotid bifurcation. Negative cervical right ICA. Left carotid system: Mild tortuosity of the proximal left CCA. Normal left carotid  bifurcation. Negative cervical left ICA. Vertebral arteries: No proximal subclavian artery plaque or stenosis. Mildly tortuous proximal left subclavian. Dominant left vertebral artery. Normal vertebral artery origins. Tortuous left V1 segment. No vertebral artery atherosclerosis or stenosis identified in the neck. CTA HEAD Posterior circulation: Dominant distal left vertebral artery functionally supplies the basilar. Normal PICA origins. No basilar stenosis. Normal SCA and PCA origins. Diminutive posterior communicating arteries. Normal PCA branches. Anterior circulation: Both ICA siphons are normal. Patent carotid termini. Normal ophthalmic and posterior communicating artery origins. Normal MCA and ACA origins. Anterior communicating artery and bilateral ACA branches are within normal limits. Left MCA M1 segment, bifurcation, and left MCA branches appear within normal limits. The right  MCA M1 segment is patent. A relatively early right MCA bifurcation occurs. Along the anterior superior aspect of this bifurcation there appears to be a truncated right MCA artery best seen on series 12, image 73 and series 10, image 123. It is unclear whether this is an M2 or proximal M3 branch. Otherwise, the right MCA branches appear normal. Venous sinuses: Patent. Anatomic variants: Dominant left vertebral artery. Delayed phase: Stable gray-white matter differentiation throughout the brain. No cortically based acute infarct identified. No abnormal enhancement identified. Review of the MIP images confirms the above findings IMPRESSION: 1. Suspected acute arterial occlusion along the anterior division of the right MCA, but it is unclear whether this is a smaller M2 or a proximal M3 branch. 2. CT perfusion indicates a small volume of anterior division right MCA territory penumbra (16 mL) concordant with #1, but is negative for evidence of core infarct at this time. 3. Otherwise negative CTA head and neck arterial findings; no  atherosclerosis or other arterial abnormality identified. 4. Stable CT appearance of the brain since 1016 hours today. 5. This study was reviewed in person with Dr. Ritta Slot between 1035 and 1045 hours. Electronically Signed   By: Odessa Fleming M.D.   On: 12/29/2016 11:07   Ct Cerebral Perfusion W Contrast  Result Date: 12/29/2016 CLINICAL DATA:  51 year old female code stroke presentation. Left side neurologic deficits. Last seen normal approximately 1700 hours yesterday. EXAM: CT ANGIOGRAPHY HEAD AND NECK CT PERFUSION BRAIN TECHNIQUE: Multidetector CT imaging of the head and neck was performed using the standard protocol during bolus administration of intravenous contrast. Multiplanar CT image reconstructions and MIPs were obtained to evaluate the vascular anatomy. Carotid stenosis measurements (when applicable) are obtained utilizing NASCET criteria, using the distal internal carotid diameter as the denominator. Multiphase CT imaging of the brain was performed following IV bolus contrast injection. Subsequent parametric perfusion maps were calculated using RAPID software. CONTRAST:  90 mL Isovue 370 COMPARISON:  Noncontrast head CT 1016 hours today. FINDINGS: CT Brain Perfusion Findings: Mild motion artifact which was subtracted automatically during the venous phase of the exam. CBF (<30%) Volume:  0 mL Perfusion (Tmax>6.0s) volume: 16 mL, located along the anterior right MCA division including inferior frontal lobe and some of the right temporal lobe. Mismatch Volume:  Not applicable Infarction Location: No core infarct suggested by CT perfusion. CTA NECK Skeleton: No acute osseous abnormality identified. Upper chest: Dependent opacity in the visible lungs compatible with atelectasis. No superior mediastinal lymphadenopathy. Other neck: Within normal limits, no cervical lymphadenopathy. Aortic arch: 3 vessel arch configuration. No arch or great vessel origin atherosclerosis. Right carotid system:  Negative right CCA aside from mild tortuosity. Normal right carotid bifurcation. Negative cervical right ICA. Left carotid system: Mild tortuosity of the proximal left CCA. Normal left carotid bifurcation. Negative cervical left ICA. Vertebral arteries: No proximal subclavian artery plaque or stenosis. Mildly tortuous proximal left subclavian. Dominant left vertebral artery. Normal vertebral artery origins. Tortuous left V1 segment. No vertebral artery atherosclerosis or stenosis identified in the neck. CTA HEAD Posterior circulation: Dominant distal left vertebral artery functionally supplies the basilar. Normal PICA origins. No basilar stenosis. Normal SCA and PCA origins. Diminutive posterior communicating arteries. Normal PCA branches. Anterior circulation: Both ICA siphons are normal. Patent carotid termini. Normal ophthalmic and posterior communicating artery origins. Normal MCA and ACA origins. Anterior communicating artery and bilateral ACA branches are within normal limits. Left MCA M1 segment, bifurcation, and left MCA branches appear within normal limits. The right  MCA M1 segment is patent. A relatively early right MCA bifurcation occurs. Along the anterior superior aspect of this bifurcation there appears to be a truncated right MCA artery best seen on series 12, image 73 and series 10, image 123. It is unclear whether this is an M2 or proximal M3 branch. Otherwise, the right MCA branches appear normal. Venous sinuses: Patent. Anatomic variants: Dominant left vertebral artery. Delayed phase: Stable gray-white matter differentiation throughout the brain. No cortically based acute infarct identified. No abnormal enhancement identified. Review of the MIP images confirms the above findings IMPRESSION: 1. Suspected acute arterial occlusion along the anterior division of the right MCA, but it is unclear whether this is a smaller M2 or a proximal M3 branch. 2. CT perfusion indicates a small volume of anterior  division right MCA territory penumbra (16 mL) concordant with #1, but is negative for evidence of core infarct at this time. 3. Otherwise negative CTA head and neck arterial findings; no atherosclerosis or other arterial abnormality identified. 4. Stable CT appearance of the brain since 1016 hours today. 5. This study was reviewed in person with Dr. Ritta Slot between 1035 and 1045 hours. Electronically Signed   By: Odessa Fleming M.D.   On: 12/29/2016 11:07   Ct Head Code Stroke W/o Cm  Result Date: 12/29/2016 CLINICAL DATA:  Code stroke. LEFT-sided weakness and slurred speech. History of HIV infection. EXAM: CT HEAD WITHOUT CONTRAST TECHNIQUE: Contiguous axial images were obtained from the base of the skull through the vertex without intravenous contrast. COMPARISON:  None.  CTA head neck and CT perfusion are pending. FINDINGS: Brain: No evidence of acute infarction, hemorrhage, hydrocephalus, extra-axial collection or mass lesion/mass effect. Normal for age cerebral volume. No significant white matter disease. Vascular: No hyperdense vessel or unexpected calcification. Skull: Normal. Negative for fracture or focal lesion. Sinuses/Orbits: No acute finding. Other: None. ASPECTS [facility] Stroke Program Early CT Score) - Ganglionic level infarction (caudate, lentiform nuclei, internal capsule, insula, M1-M3 cortex): 7 - Supraganglionic infarction (M4-M6 cortex): 3 Total score (0-10 with 10 being normal): 10 IMPRESSION: 1. Negative exam. 2. ASPECTS is 10. These results were texted via AMION at the time of interpretation on 12/29/2016 at 10:22 am to Dr. Amada Jupiter. Electronically Signed   By: Elsie Stain M.D.   On: 12/29/2016 10:23    EKG: Independently reviewed. Sinus. No ACS  Assessment/Plan Active Problems:   HIV disease (HCC)   Schizoaffective disorder (HCC)   Chronic pain syndrome   Cigarette smoker   Stroke (cerebrum) (HCC)   Ischemic stroke (HCC)   Chronic headaches   Stroke: ischemic.  Possible CTA showing acute occlusion of R anterior MCA but negative study for infarct. Pt stating deficits remain unchanged from onset. Neuro following, and pt outside window for acute therapy, additionally may not be of benefit due to possible artifact on the scan and distal location of occlusion.   - MRI - Echo - PT/OT/SLP - permissive HTN - A1c, Lipids  HIV: last CD4 in 05/2016 showing 1,160 and HIV quant of <20.  - continue tivicay, Complera  Depression/schizoaffective disorder: - continue wellbutrin, seroquel  HA: currently w/ HA which pt states is her typical HA.  - continue topamax - Hold imitrex until acute stage of sx resolves as can cause vasoconstriction og intracranial vessels.  - Tylenol  Chronic pain: - continue oxycodone  Tobacco: trying to quit. Down to 2-3 cigarettes per day.  - nictone patch PRN     DVT prophylaxis: hep  Code  Status: full  Family Communication: husband  Disposition Plan: pending workup for stroke and possible CIR transfer  Consults called: none  Admission status: inpt    Deidra Spease J MD [facility]  If 7PM-7AM, please contact night-coverage [email or web address] Password [email or web address]  12/29/2016, 1:49 PM

## 2016-12-29 NOTE — Consult Note (Signed)
Neurology Consultation Reason for Consult: Code Stroke Referring Physician: Madilyn Hook, E  CC: Left sided pain  History is obtained from:patient  HPI: Tracy [name] is a 51 y.o. female with a week of left arm pain, who noticed worsening weakness starting yesterday around 5pm. This morning, she was still able to walk and talk, but began having slurred speech and then appears to develop aphasia since arriving in the emergency department.  Given the presence of weakness and aphasia, a code stroke was activated per the extended time window guidelines.   LKW: 5pm yesterday.  tpa given?: no, outside of window   ROS: A 14 point ROS was performed and is negative except as noted in the HPI.   Past Medical History:  Diagnosis Date  . Depression   . HIV infection (HCC)      Family History  Problem Relation Age of Onset  . Diabetes Mother   . Hypertension Mother   . Breast cancer Maternal Aunt      Social History:  reports that she has been smoking Cigarettes.  She has been smoking about 0.30 packs per day. She has never used smokeless tobacco. She reports that she does not drink alcohol or use drugs.   Exam: Current vital signs: There were no vitals taken for this visit. Vital signs in last 24 hours:     Physical Exam  Constitutional: Appears well-developed and well-nourished.  Psych: Affect appropriate to situation Eyes: No scleral injection HENT: No OP obstrucion Head: Normocephalic.  Cardiovascular: Normal rate and regular rhythm.  Respiratory: Effort normal and breath sounds normal to anterior ascultation GI: Soft.  No distension. There is no tenderness.  Skin: WDI  Neuro: Mental Status: Patient is awake, alert, She only answers with one or 2 words, but does answer without dysarthria. Cranial Nerves: II: Visual Fields are full. Pupils are equal, round, and reactive to light.   III,IV, VI: EOMI without ptosis or diploplia.  V: Facial sensation is diminished on the  left, including splitting midline to vibration.  VII: Facial movement is inconsistent with suggestion of weakness on the left, VIII: hearing is intact to voice X: Uvula elevates symmetrically XI: Shoulder shrug is symmetric. XII: tongue is midline without atrophy or fasciculations.  Motor: Tone is normal. Bulk is normal. 5/5 strength was present on the right side, the left side is limited severely by pain at the hip as well as pain in the shoulder, and she does not give good effort on the left side at all. She has at least 4/5 strength in the left arm and leg.  Sensory: Sensation is diminished throughout the left side.  Cerebellar: FNF intact on the right, consistent with weakness on the left.   I have reviewed labs in epic and the results pertinent to this consultation are: Normal creatinine  I have reviewed the images obtained: CT head-no acute hemorrhage  Impression: 51 year old female with a history of HIV who presents with left-sided weakness which acutely worsened this morning. There are some inconsistent findings on her exam, but given her history, however, she was taken for a stat CT angiogram.   Recommendations: 1. HgbA1c, fasting lipid panel 2. MRI, MRA  of the brain without contrast 3. Frequent neuro checks 4. Echocardiogram 5. Carotid dopplers 6. Prophylactic therapy-Antiplatelet med: Aspirin - dose 325mg  PO or 300mg  PR 7. Risk factor modification 8. Telemetry monitoring 9. PT consult, OT consult, Speech consult 10. please page stroke NP  Or  PA  Or MD  from 8am -  4 pm as this patient will be followed by the stroke team at this point.   You can look them up on [email or web address]   11. Reglan for headache.    Ritta SlotMcNeill Kirkpatrick, MD [facility] [phone]  If 7pm- 7am, please page neurology on call as listed in AMION.

## 2016-12-29 NOTE — Code Documentation (Signed)
51[facility] [phone].  Patient from home where she went to bed at 1700 yesterday and woke up at 0700 this morning with left sided weakness.  On arrival to the ED she developed left facial droop and slurred speech.  LKW 12/28/2016 1700.  Code stroke activated.  Stroke team to the bedside.  Patient to CT 3 with team.  CT completed.  NIHSS 3, see documentation for details and code stroke times.  Patient with dysarthria and left arm and leg drift on exam.  Of note, patient with pain limiting movement of LUE.  Dr. Amada JupiterKirkpatrick to the bedside.  CTA and CTP ordered.  Patient transferred to CT 2.  CTA and CTP completed.  Patient back to E40 with team.  No acute stroke treatment at this time.  Bedside handoff with ED RN Whitney.

## 2016-12-29 NOTE — ED Provider Notes (Signed)
[facility]-EMER[facility]ENCY DEPT Provider Note   CSN: [id number] Arrival date & time: 12/29/16  [id number]     History   Chief Complaint No chief complaint on file.   HPI Tracy [name] is a 51 y.o. female.  The history is provided by the patient. No language interpreter was used.   Tracy [name] is a 51 y.o. female who presents to the Emergency Department complaining of left sided weakness.  She presents for evaluation of left-sided weakness that started this morning. She has experienced 1 week of left shoulder pain that is worse with movement. This morning when she woke up she had weakness in her left face, arm, leg as well as speech difficulties. She was last seen normal at 5 PM last night. No prior similar symptoms. Past Medical History:  Diagnosis Date  . Depression   . HIV infection [facility](HCC)     Patient Active Problem List   Diagnosis Date Noted  . Breast pain, left 05/25/2016  . Dry skin dermatitis 02/10/2016  . Itching due to drug 12/26/2015  . HIV disease (HCC) 10/03/2015  . Schizoaffective disorder (HCC) 10/03/2015  . Chronic pain syndrome 10/03/2015  . Cigarette smoker 10/03/2015    Past Surgical History:  Procedure Laterality Date  . ABDOMINAL HYSTERECTOMY    . BREAST EXCISIONAL BIOPSY Right 1990's  . BREAST SURGERY      OB History    No data available       Home Medications    Prior to Admission medications   Medication Sig Start Date End Date Taking? Authorizing Provider  buPROPion (WELLBUTRIN XL) 300 MG 24 hr tablet Take 1 tablet (300 mg total) by mouth daily. 10/03/15   Tracy Astersampbell, John, MD  cephALEXin (KEFLEX) 500 MG capsule Take 1 capsule (500 mg total) by mouth 4 (four) times daily. 05/31/16   Muthersbaugh, Dahlia ClientHannah, PA-C  dolutegravir (TIVICAY) 50 MG tablet Take 1 tablet (50 mg total) by mouth daily. 01/20/16   Tracy Astersampbell, John, MD  emtricitabine-rilpivir-tenofovir DF (COMPLERA) 200-25-300 MG tablet Take 1 tablet by mouth daily. 12/26/15   Tracy Astersampbell, John, MD  ferrous  sulfate 325 (65 FE) MG tablet Take 325 mg by mouth daily with breakfast.    [provider]  loratadine (CLARITIN) 10 MG tablet Take 10 mg by mouth daily.    [provider]  Multiple Vitamin (MULTIVITAMIN) tablet Take 1 tablet by mouth daily.    [provider]  Oxycodone HCl 10 MG TABS Take 10 mg by mouth 3 (three) times daily as needed.    [provider]  QUEtiapine (SEROQUEL XR) 400 MG 24 hr tablet Take 1 tablet (400 mg total) by mouth at bedtime. 10/03/15   Tracy Astersampbell, John, MD  SUMAtriptan (IMITREX) 100 MG tablet Take 100 mg by mouth every 2 (two) hours as needed for migraine. May repeat in 2 hours if headache persists or recurs.    [provider]  topiramate (TOPAMAX) 100 MG tablet Take 1 tablet (100 mg total) by mouth daily. 10/03/15   Tracy Astersampbell, John, MD    Family History Family History  Problem Relation Age of Onset  . Diabetes Mother   . Hypertension Mother   . Breast cancer Maternal Aunt     Social History Social History  Substance Use Topics  . Smoking status: Current Some Day Smoker    Packs/day: 0.30    Types: Cigarettes    Last attempt to quit: 04/26/2016  . Smokeless tobacco: Never Used     Comment: cutting back  .  Alcohol use No     Comment: quit 6.2007     Allergies   Tetracyclines & related; Tramadol; Flexeril [cyclobenzaprine]; and Moxifloxacin   Review of Systems Review of Systems  All other systems reviewed and are negative.    Physical Exam Updated Vital Signs There were no vitals taken for this visit.  Physical Exam  Constitutional: She is oriented to person, place, and time. She appears well-developed and well-nourished.  HENT:  Head: Normocephalic and atraumatic.  Cardiovascular: Normal rate and regular rhythm.   No murmur heard. Pulmonary/Chest: Effort normal and breath sounds normal. No respiratory distress.  Abdominal: Soft. There is no tenderness. There is no rebound and no guarding.    Musculoskeletal: She exhibits no edema or tenderness.  Neurological: She is alert and oriented to person, place, and time.  Slightly dysarthric speech with weakness of the left face, left arm, left leg. Sensation to light touch intact in all 4 extremities.  Skin: Skin is warm and dry.  Psychiatric: She has a normal mood and affect. Her behavior is normal.  Nursing note and vitals reviewed.    ED Treatments / Results  Labs (all labs ordered are listed, but only abnormal results are displayed) Labs Reviewed  I-STAT CHEM 8, ED - Abnormal; Notable for the following:       Result Value   Glucose, Bld 105 (*)    Calcium, Ion 1.09 (*)    All other components within normal limits  CBC  DIFFERENTIAL  ETHANOL  PROTIME-INR  APTT  COMPREHENSIVE METABOLIC PANEL  RAPID URINE DRUG SCREEN, HOSP PERFORMED  URINALYSIS, ROUTINE W REFLEX MICROSCOPIC  I-STAT TROPOININ, ED    EKG  EKG Interpretation  Date/Time:  Tuesday December 29 2016 10:03:39 EDT Ventricular Rate:  96 PR Interval:    QRS Duration: 83 QT Interval:  339 QTC Calculation: 429 R Axis:   25 Text Interpretation:  Sinus rhythm Low voltage, precordial leads Confirmed by Tracy [name] [phone]) on 12/29/2016 10:07:53 AM       Radiology No results found.  Procedures Procedures (including critical care time)  Medications Ordered in ED Medications  iopamidol (ISOVUE-370) 76 % injection (not administered)     Initial Impression / Assessment and Plan / ED Course  I have reviewed the triage vital signs and the nursing notes.  Pertinent labs & imaging results that were available during my care of the patient were reviewed by me and considered in my medical decision making (see chart for details).    Pt with hx/o HIV here for evaluation of left sided weakness starting upon waking  Concern for possible large vessel CVA on ED arrival and code stroke activated.  Pt not a candidate for systemic TPA given duration of symptoms.     Imaging does demonstrate small distal occlusion not treatable by endovascular means.  Plan to admit for further work up/treatment for acute CVA.    Discussed case with Neuro Hospitalist and Hospitalist.    Final Clinical Impressions(s) / ED Diagnoses   Final diagnoses:  Left-sided weakness    New Prescriptions New Prescriptions   No medications on file     Tilden Fossa, MD 12/30/16 1028

## 2016-12-29 NOTE — Progress Notes (Signed)
PT Cancellation Note  Patient Details Name: Tracy KaufmannYavohnda [name] MRN: [id number] DOB: 08/31/1965   Cancelled Treatment:    Reason Eval/Treat Not Completed: Patient not medically ready, just arrived to unit, spoke with nsg will hold evaluation at this time   Fabio AsaDevon J Nakima Fluegge 12/29/2016, 2:04 PM

## 2016-12-30 ENCOUNTER — Inpatient Hospital Stay (HOSPITAL_COMMUNITY): Payer: Medicaid Other

## 2016-12-30 DIAGNOSIS — F449 Dissociative and conversion disorder, unspecified: Secondary | ICD-10-CM

## 2016-12-30 DIAGNOSIS — G44021 Chronic cluster headache, intractable: Secondary | ICD-10-CM

## 2016-12-30 DIAGNOSIS — I360 Nonrheumatic tricuspid (valve) stenosis: Secondary | ICD-10-CM

## 2016-12-30 DIAGNOSIS — M5412 Radiculopathy, cervical region: Secondary | ICD-10-CM

## 2016-12-30 LAB — LIPID PANEL
Cholesterol: 160 mg/dL (ref 0–200)
HDL: 50 mg/dL (ref >40)
LDL CALC: 74 mg/dL (ref 0–99)
Total CHOL/HDL Ratio: 3.2 RATIO
Triglycerides: 182 mg/dL — ABNORMAL HIGH (ref <150)
VLDL: 36 mg/dL (ref 0–40)

## 2016-12-30 LAB — ECHOCARDIOGRAM COMPLETE
HEIGHTINCHES: 67 in
WEIGHTICAEL: 3072 [oz_av]

## 2016-12-30 MED ORDER — GABAPENTIN 300 MG PO CAPS
300.0000 mg | ORAL_CAPSULE | Freq: Three times a day (TID) | ORAL | Status: DC
  Administered 2016-12-30 – 2016-12-31 (×3): 300 mg via ORAL
  Filled 2016-12-30 (×3): qty 1

## 2016-12-30 NOTE — Progress Notes (Signed)
Physical Therapy Treatment Patient Details Name: Tracy [name] MRN: [id number] DOB: 28-Nov-1965 Today's Date: 12/30/2016    History of Present Illness Pt is a 51 yo female admitted to the ED for sudden onset of left upper and lower extremity weakness and numbness. Past medical history signicant for depression and HIV. 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     PT Comments    Patient seen in conjunction with neuro MD due to poor activity and ability to ambulate. Assisted patient with ambulation, max cues, min assist for limited distance. Patient with inconsistent gait conditions but responded well to cues. Will continue to assess further and progress as tolerated.   Follow Up Recommendations  CIR;Supervision for mobility/OOB (vs HHPT pending progression tomorrow session)     Equipment Recommendations  Other (comment) (Determine at next level of care)    Recommendations for Other Services Rehab consult     Precautions / Restrictions Precautions Precautions: Fall Restrictions Weight Bearing Restrictions: No    Mobility  Bed Mobility               General bed mobility comments: received up in standing position with neuro MD  Transfers Overall transfer level: Needs assistance Equipment used: Rolling walker (2 wheeled) Transfers: Sit to/from [employer] Sit to Stand: Min assist         General transfer comment: vc for hand placement using RW. Min A to steady to power up. During stand pivot trnsfer, pt lifting LLE off floor.  Ambulation/Gait Ambulation/Gait assistance: Min assist Ambulation Distance (Feet): 18 Feet Assistive device: Rolling walker (2 wheeled) Gait Pattern/deviations: Step-through pattern;Decreased stride length;Trunk flexed;Narrow base of support     General Gait Details: patient cued to flex knee during mobility as patient clearly locking into extension despite no evidence of tone on assessment  LLE. Patient cued for relaxation, responded well with sequencing and cues. Min assist for stability and maximal encouragement   Stairs            Wheelchair Mobility    Modified Rankin (Stroke Patients Only)       Balance Overall balance assessment: Needs assistance Sitting-balance support: Bilateral upper extremity supported;Feet supported Sitting balance-Leahy Scale: Good Sitting balance - Comments: sat EOB without BUE support   Standing balance support: Bilateral upper extremity supported;During functional activity Standing balance-Leahy Scale: Poor Standing balance comment: reliant on RW                            Cognition Arousal/Alertness: Awake/alert Behavior During Therapy: WFL for tasks assessed/performed Overall Cognitive Status: Within Functional Limits for tasks assessed                                 General Comments: per pt speech changes      Exercises      General Comments        Pertinent Vitals/Pain Pain Assessment: Faces Faces Pain Scale: Hurts little more Pain Location: left shoulder and LLE Pain Descriptors / Indicators: Sore Pain Intervention(s): Heat applied;Repositioned;Limited activity within patient's tolerance;Monitored during session    Home Living                      Prior Function            PT Goals (current goals can now be found in the care plan section)  Acute Rehab PT Goals Patient Stated Goal: to get better PT Goal Formulation: With patient Time For Goal Achievement: 01/13/17 Potential to Achieve Goals: Good Progress towards PT goals: Progressing toward goals    Frequency    Min 3X/week      PT Plan      Co-evaluation              AM-PAC PT "6 Clicks" Daily Activity  Outcome Measure  Difficulty turning over in bed (including adjusting bedclothes, sheets and blankets)?: Total Difficulty moving from lying on back to sitting on the side of the bed? :  Total Difficulty sitting down on and standing up from a chair with arms (e.g., wheelchair, bedside commode, etc,.)?: Total Help needed moving to and from a bed to chair (including a wheelchair)?: A Lot Help needed walking in hospital room?: A Lot Help needed climbing 3-5 steps with a railing? : Total 6 Click Score: 8    End of Session Equipment Utilized During Treatment: Gait belt Activity Tolerance: Patient tolerated treatment well Patient left: in chair;with call bell/phone within reach;with family/visitor present Nurse Communication: Mobility status PT Visit Diagnosis: Other abnormalities of gait and mobility (R26.89);Muscle weakness (generalized) (M62.81);Hemiplegia and hemiparesis Hemiplegia - Right/Left: Left Hemiplegia - dominant/non-dominant: Non-dominant Hemiplegia - caused by: Cerebral infarction     Time: [id number] PT Time Calculation (min) (ACUTE ONLY): 21 min  Charges:  $Gait Training: 8-22 mins                    G Codes:       Tracy [name], PT DPT  [phone]    Tracy [name] 12/30/2016, 5:00 PM

## 2016-12-30 NOTE — Progress Notes (Signed)
[facility] PROGRESS NOTE  Tracy [name] ZOX:[id number] DOB: 1966-07-12 DOA: 12/29/2016   PCP: System, Pcp Not In     Assessment/Plan: Active Problems:   HIV disease (HCC)   Schizoaffective disorder (HCC)   Chronic pain syndrome   Cigarette smoker   Stroke (cerebrum) (HCC)   Ischemic stroke (HCC)   Chronic headaches    51 y.o. female with medical history significant of Depression, HIV. Patient is followed at a [facility] primary care office but gets her HIV treatment by Dr. Orvan Falconer. Patient states that she developed sudden onset left upper extremity left lower extremity weakness and numbness. Occurred at approximately 08:30. EMS was called to evaluate the patient and brought the patient to [facility]. While in route patient developed left facial weakness, droop and slurring of speech. Patient was evaluated by neurology Dr. Petra Kuba, after code stroke was activated. He recommended admission for evaluation of CVA  Assessment and plan Stroke: ischemic. Possible CTA showing acute occlusion of R anterior MCA but negative study for infarct. Pt stating deficits remain unchanged from onset. Neuro following, and pt outside window for acute therapy, additionally may not be of benefit due to possible artifact on the scan and distal location of occlusion.   - MRI does not show acute process  - Echo pending  - PT/OT/-CIR , SLP pending - permissive HTN - A1c, Lipids-LDL 74, triglycerides 182 Continue ASA 325 mg /day   HIV: last CD4 in 05/2016 showing 1,160 and HIV quant of <20.  - continue tivicay, Complera  Depression/schizoaffective disorder: - continue wellbutrin, seroquel  HA: currently w/ HA which pt states is her typical HA.  - continue topamax - Hold imitrex until acute stage of sx resolves as can cause vasoconstriction og intracranial vessels.  - Tylenol  Chronic pain: - continue oxycodone  Tobacco: trying to quit. Down to 2-3 cigarettes per day.  - nictone  patch PRN    DVT prophylaxsis heparin  Code Status:  Full code    Family Communication: Discussed in detail with the patient, all imaging results, lab results explained to the patient   Disposition Plan:  1-2 days      Consultants:  Neurology  Procedures:  None  Antibiotics: Anti-infectives    Start     Dose/Rate Route Frequency Ordered Stop   12/30/16 0800  emtricitabine-rilpivir-tenofovir AF (ODEFSEY) 200-25-25 MG per tablet 1 tablet     1 tablet Oral Daily with breakfast 12/29/16 1347     12/29/16 1430  dolutegravir (TIVICAY) tablet 50 mg     50 mg Oral Daily 12/29/16 1347           HPI/Subjective: Complaining of left sided weakness and slurred speech  Objective: Vitals:   12/29/16 2200 12/30/16 0000 12/30/16 0200 12/30/16 0528  BP: 116/74 109/67 114/77 (!) 104/57  Pulse: 70 87 76 78  Resp: 18 18 18 18   Temp:    97.8 F (36.6 C)  TempSrc:    Oral  SpO2: 100% 100% 100% 97%  Weight:      Height:        Intake/Output Summary (Last 24 hours) at 12/30/16 0925 Last data filed at 12/30/16 0326  Gross per 24 hour  Intake           978.34 ml  Output              150 ml  Net           828.34 ml  Exam:  Examination:  General exam: Appears calm and comfortable  Respiratory system: Clear to auscultation. Respiratory effort normal. Cardiovascular system: S1 & S2 heard, RRR. No JVD, murmurs, rubs, gallops or clicks. No pedal edema. Gastrointestinal system: Abdomen is nondistended, soft and nontender. No organomegaly or masses felt. Normal bowel sounds heard. Central nervous system: Alert and oriented. No focal neurological deficits. Extremities:decreased motor strength left upper and lower extremity Skin: No rashes, lesions or ulcers Psychiatry: Judgement and insight appear normal. Mood & affect appropriate.     Data Reviewed: I have personally reviewed following labs and imaging studies  Micro Results No results found for this or any previous  visit (from the past 240 hour(s)).  Radiology Reports Ct Angio Head W Or Wo Contrast  Result Date: 12/29/2016 CLINICAL DATA:  51 year old female code stroke presentation. Left side neurologic deficits. Last seen normal approximately 1700 hours yesterday. EXAM: CT ANGIOGRAPHY HEAD AND NECK CT PERFUSION BRAIN TECHNIQUE: Multidetector CT imaging of the head and neck was performed using the standard protocol during bolus administration of intravenous contrast. Multiplanar CT image reconstructions and MIPs were obtained to evaluate the vascular anatomy. Carotid stenosis measurements (when applicable) are obtained utilizing NASCET criteria, using the distal internal carotid diameter as the denominator. Multiphase CT imaging of the brain was performed following IV bolus contrast injection. Subsequent parametric perfusion maps were calculated using RAPID software. CONTRAST:  90 mL Isovue 370 COMPARISON:  Noncontrast head CT 1016 hours today. FINDINGS: CT Brain Perfusion Findings: Mild motion artifact which was subtracted automatically during the venous phase of the exam. CBF (<30%) Volume:  0 mL Perfusion (Tmax>6.0s) volume: 16 mL, located along the anterior right MCA division including inferior frontal lobe and some of the right temporal lobe. Mismatch Volume:  Not applicable Infarction Location: No core infarct suggested by CT perfusion. CTA NECK Skeleton: No acute osseous abnormality identified. Upper chest: Dependent opacity in the visible lungs compatible with atelectasis. No superior mediastinal lymphadenopathy. Other neck: Within normal limits, no cervical lymphadenopathy. Aortic arch: 3 vessel arch configuration. No arch or great vessel origin atherosclerosis. Right carotid system: Negative right CCA aside from mild tortuosity. Normal right carotid bifurcation. Negative cervical right ICA. Left carotid system: Mild tortuosity of the proximal left CCA. Normal left carotid bifurcation. Negative cervical left ICA.  Vertebral arteries: No proximal subclavian artery plaque or stenosis. Mildly tortuous proximal left subclavian. Dominant left vertebral artery. Normal vertebral artery origins. Tortuous left V1 segment. No vertebral artery atherosclerosis or stenosis identified in the neck. CTA HEAD Posterior circulation: Dominant distal left vertebral artery functionally supplies the basilar. Normal PICA origins. No basilar stenosis. Normal SCA and PCA origins. Diminutive posterior communicating arteries. Normal PCA branches. Anterior circulation: Both ICA siphons are normal. Patent carotid termini. Normal ophthalmic and posterior communicating artery origins. Normal MCA and ACA origins. Anterior communicating artery and bilateral ACA branches are within normal limits. Left MCA M1 segment, bifurcation, and left MCA branches appear within normal limits. The right MCA M1 segment is patent. A relatively early right MCA bifurcation occurs. Along the anterior superior aspect of this bifurcation there appears to be a truncated right MCA artery best seen on series 12, image 73 and series 10, image 123. It is unclear whether this is an M2 or proximal M3 branch. Otherwise, the right MCA branches appear normal. Venous sinuses: Patent. Anatomic variants: Dominant left vertebral artery. Delayed phase: Stable gray-white matter differentiation throughout the brain. No cortically based acute infarct identified. No abnormal enhancement identified. Review of the  MIP images confirms the above findings IMPRESSION: 1. Suspected acute arterial occlusion along the anterior division of the right MCA, but it is unclear whether this is a smaller M2 or a proximal M3 branch. 2. CT perfusion indicates a small volume of anterior division right MCA territory penumbra (16 mL) concordant with #1, but is negative for evidence of core infarct at this time. 3. Otherwise negative CTA head and neck arterial findings; no atherosclerosis or other arterial abnormality  identified. 4. Stable CT appearance of the brain since 1016 hours today. 5. This study was reviewed in person with Dr. Ritta Slot between 1035 and 1045 hours. Electronically Signed   By: Odessa Fleming M.D.   On: 12/29/2016 11:07   Ct Angio Neck W And/or Wo Contrast  Result Date: 12/29/2016 CLINICAL DATA:  51 year old female code stroke presentation. Left side neurologic deficits. Last seen normal approximately 1700 hours yesterday. EXAM: CT ANGIOGRAPHY HEAD AND NECK CT PERFUSION BRAIN TECHNIQUE: Multidetector CT imaging of the head and neck was performed using the standard protocol during bolus administration of intravenous contrast. Multiplanar CT image reconstructions and MIPs were obtained to evaluate the vascular anatomy. Carotid stenosis measurements (when applicable) are obtained utilizing NASCET criteria, using the distal internal carotid diameter as the denominator. Multiphase CT imaging of the brain was performed following IV bolus contrast injection. Subsequent parametric perfusion maps were calculated using RAPID software. CONTRAST:  90 mL Isovue 370 COMPARISON:  Noncontrast head CT 1016 hours today. FINDINGS: CT Brain Perfusion Findings: Mild motion artifact which was subtracted automatically during the venous phase of the exam. CBF (<30%) Volume:  0 mL Perfusion (Tmax>6.0s) volume: 16 mL, located along the anterior right MCA division including inferior frontal lobe and some of the right temporal lobe. Mismatch Volume:  Not applicable Infarction Location: No core infarct suggested by CT perfusion. CTA NECK Skeleton: No acute osseous abnormality identified. Upper chest: Dependent opacity in the visible lungs compatible with atelectasis. No superior mediastinal lymphadenopathy. Other neck: Within normal limits, no cervical lymphadenopathy. Aortic arch: 3 vessel arch configuration. No arch or great vessel origin atherosclerosis. Right carotid system: Negative right CCA aside from mild tortuosity.  Normal right carotid bifurcation. Negative cervical right ICA. Left carotid system: Mild tortuosity of the proximal left CCA. Normal left carotid bifurcation. Negative cervical left ICA. Vertebral arteries: No proximal subclavian artery plaque or stenosis. Mildly tortuous proximal left subclavian. Dominant left vertebral artery. Normal vertebral artery origins. Tortuous left V1 segment. No vertebral artery atherosclerosis or stenosis identified in the neck. CTA HEAD Posterior circulation: Dominant distal left vertebral artery functionally supplies the basilar. Normal PICA origins. No basilar stenosis. Normal SCA and PCA origins. Diminutive posterior communicating arteries. Normal PCA branches. Anterior circulation: Both ICA siphons are normal. Patent carotid termini. Normal ophthalmic and posterior communicating artery origins. Normal MCA and ACA origins. Anterior communicating artery and bilateral ACA branches are within normal limits. Left MCA M1 segment, bifurcation, and left MCA branches appear within normal limits. The right MCA M1 segment is patent. A relatively early right MCA bifurcation occurs. Along the anterior superior aspect of this bifurcation there appears to be a truncated right MCA artery best seen on series 12, image 73 and series 10, image 123. It is unclear whether this is an M2 or proximal M3 branch. Otherwise, the right MCA branches appear normal. Venous sinuses: Patent. Anatomic variants: Dominant left vertebral artery. Delayed phase: Stable gray-white matter differentiation throughout the brain. No cortically based acute infarct identified. No abnormal enhancement identified. Review  of the MIP images confirms the above findings IMPRESSION: 1. Suspected acute arterial occlusion along the anterior division of the right MCA, but it is unclear whether this is a smaller M2 or a proximal M3 branch. 2. CT perfusion indicates a small volume of anterior division right MCA territory penumbra (16 mL)  concordant with #1, but is negative for evidence of core infarct at this time. 3. Otherwise negative CTA head and neck arterial findings; no atherosclerosis or other arterial abnormality identified. 4. Stable CT appearance of the brain since 1016 hours today. 5. This study was reviewed in person with Dr. Ritta Slot between 1035 and 1045 hours. Electronically Signed   By: Odessa Fleming M.D.   On: 12/29/2016 11:07   Mr Brain Wo Contrast  Result Date: 12/29/2016 CLINICAL DATA:  Acute onset LEFT extremity weakness and numbness beginning at 0830 hours. Slurred speech. Assess RIGHT MCA stroke. History of HIV and chronic headaches. EXAM: MRI HEAD WITHOUT CONTRAST TECHNIQUE: Multiplanar, multiecho pulse sequences of the brain and surrounding structures were obtained without intravenous contrast. Axial T1, axial MPGR from at axial T1 and coronal T2 sequences not obtained. Patient was unable to complete examination due to back pain. COMPARISON:  CT perfusion December 29, 2016 at 1027 hours FINDINGS: BRAIN: No reduced diffusion to suggest acute ischemia. No susceptibility artifact to suggest hemorrhage. The ventricles and sulci are normal for patient's age. A few scattered subcentimeter supratentorial nonspecific white matter T2 hyperintensities, normal for age. No suspicious parenchymal signal, masses or mass effect. No abnormal extra-axial fluid collections. VASCULAR: Normal major intracranial vascular flow voids present at skull base. SKULL AND UPPER CERVICAL SPINE: No abnormal sellar expansion. No suspicious calvarial bone marrow signal. Craniocervical junction maintained. SINUSES/ORBITS: The mastoid air-cells and included paranasal sinuses are well-aerated. The included ocular globes and orbital contents are non-suspicious. Old LEFT medial orbital blowout fracture. OTHER: None. IMPRESSION: No acute intracranial process ; negative limited noncontrast MRI of the head (truncated examination due to patient pain).  Electronically Signed   By: Awilda Metro M.D.   On: 12/29/2016 21:26   Ct Cerebral Perfusion W Contrast  Result Date: 12/29/2016 CLINICAL DATA:  51 year old female code stroke presentation. Left side neurologic deficits. Last seen normal approximately 1700 hours yesterday. EXAM: CT ANGIOGRAPHY HEAD AND NECK CT PERFUSION BRAIN TECHNIQUE: Multidetector CT imaging of the head and neck was performed using the standard protocol during bolus administration of intravenous contrast. Multiplanar CT image reconstructions and MIPs were obtained to evaluate the vascular anatomy. Carotid stenosis measurements (when applicable) are obtained utilizing NASCET criteria, using the distal internal carotid diameter as the denominator. Multiphase CT imaging of the brain was performed following IV bolus contrast injection. Subsequent parametric perfusion maps were calculated using RAPID software. CONTRAST:  90 mL Isovue 370 COMPARISON:  Noncontrast head CT 1016 hours today. FINDINGS: CT Brain Perfusion Findings: Mild motion artifact which was subtracted automatically during the venous phase of the exam. CBF (<30%) Volume:  0 mL Perfusion (Tmax>6.0s) volume: 16 mL, located along the anterior right MCA division including inferior frontal lobe and some of the right temporal lobe. Mismatch Volume:  Not applicable Infarction Location: No core infarct suggested by CT perfusion. CTA NECK Skeleton: No acute osseous abnormality identified. Upper chest: Dependent opacity in the visible lungs compatible with atelectasis. No superior mediastinal lymphadenopathy. Other neck: Within normal limits, no cervical lymphadenopathy. Aortic arch: 3 vessel arch configuration. No arch or great vessel origin atherosclerosis. Right carotid system: Negative right CCA aside from mild tortuosity.  Normal right carotid bifurcation. Negative cervical right ICA. Left carotid system: Mild tortuosity of the proximal left CCA. Normal left carotid bifurcation.  Negative cervical left ICA. Vertebral arteries: No proximal subclavian artery plaque or stenosis. Mildly tortuous proximal left subclavian. Dominant left vertebral artery. Normal vertebral artery origins. Tortuous left V1 segment. No vertebral artery atherosclerosis or stenosis identified in the neck. CTA HEAD Posterior circulation: Dominant distal left vertebral artery functionally supplies the basilar. Normal PICA origins. No basilar stenosis. Normal SCA and PCA origins. Diminutive posterior communicating arteries. Normal PCA branches. Anterior circulation: Both ICA siphons are normal. Patent carotid termini. Normal ophthalmic and posterior communicating artery origins. Normal MCA and ACA origins. Anterior communicating artery and bilateral ACA branches are within normal limits. Left MCA M1 segment, bifurcation, and left MCA branches appear within normal limits. The right MCA M1 segment is patent. A relatively early right MCA bifurcation occurs. Along the anterior superior aspect of this bifurcation there appears to be a truncated right MCA artery best seen on series 12, image 73 and series 10, image 123. It is unclear whether this is an M2 or proximal M3 branch. Otherwise, the right MCA branches appear normal. Venous sinuses: Patent. Anatomic variants: Dominant left vertebral artery. Delayed phase: Stable gray-white matter differentiation throughout the brain. No cortically based acute infarct identified. No abnormal enhancement identified. Review of the MIP images confirms the above findings IMPRESSION: 1. Suspected acute arterial occlusion along the anterior division of the right MCA, but it is unclear whether this is a smaller M2 or a proximal M3 branch. 2. CT perfusion indicates a small volume of anterior division right MCA territory penumbra (16 mL) concordant with #1, but is negative for evidence of core infarct at this time. 3. Otherwise negative CTA head and neck arterial findings; no atherosclerosis or  other arterial abnormality identified. 4. Stable CT appearance of the brain since 1016 hours today. 5. This study was reviewed in person with Dr. Ritta SlotMcNeill Kirkpatrick between 1035 and 1045 hours. Electronically Signed   By: Odessa FlemingH  Hall M.D.   On: 12/29/2016 11:07   Ct Head Code Stroke W/o Cm  Result Date: 12/29/2016 CLINICAL DATA:  Code stroke. LEFT-sided weakness and slurred speech. History of HIV infection. EXAM: CT HEAD WITHOUT CONTRAST TECHNIQUE: Contiguous axial images were obtained from the base of the skull through the vertex without intravenous contrast. COMPARISON:  None.  CTA head neck and CT perfusion are pending. FINDINGS: Brain: No evidence of acute infarction, hemorrhage, hydrocephalus, extra-axial collection or mass lesion/mass effect. Normal for age cerebral volume. No significant white matter disease. Vascular: No hyperdense vessel or unexpected calcification. Skull: Normal. Negative for fracture or focal lesion. Sinuses/Orbits: No acute finding. Other: None. ASPECTS [facility]([place] Stroke Program Early CT Score) - Ganglionic level infarction (caudate, lentiform nuclei, internal capsule, insula, M1-M3 cortex): 7 - Supraganglionic infarction (M4-M6 cortex): 3 Total score (0-10 with 10 being normal): 10 IMPRESSION: 1. Negative exam. 2. ASPECTS is 10. These results were texted via AMION at the time of interpretation on 12/29/2016 at 10:22 am to Dr. Amada JupiterKirkpatrick. Electronically Signed   By: Elsie StainJohn T Curnes M.D.   On: 12/29/2016 10:23     CBC  Recent Labs Lab 12/29/16 0954 12/29/16 1003  WBC 6.1  --   HGB 13.3 13.3  HCT 40.2 39.0  PLT 228  --   MCV 95.0  --   MCH 31.4  --   MCHC 33.1  --   RDW 13.1  --   LYMPHSABS 1.9  --  MONOABS 0.5  --   EOSABS 0.1  --   BASOSABS 0.0  --     Chemistries   Recent Labs Lab 12/29/16 0954 12/29/16 1003  NA 139 141  K 3.8 3.8  CL 108 106  CO2 22  --   GLUCOSE 109* 105*  BUN 7 7  CREATININE 1.08* 1.00  CALCIUM 9.1  --   AST 22  --   ALT 17  --    ALKPHOS 63  --   BILITOT 0.6  --    ------------------------------------------------------------------------------------------------------------------ estimated creatinine clearance is 77.1 mL/min (by C-G formula based on SCr of 1 mg/dL). ------------------------------------------------------------------------------------------------------------------ No results for input(s): HGBA1C in the last 72 hours. ------------------------------------------------------------------------------------------------------------------  Recent Labs  12/30/16 0506  CHOL 160  HDL 50  LDLCALC 74  TRIG 182*  CHOLHDL 3.2   ------------------------------------------------------------------------------------------------------------------ No results for input(s): TSH, T4TOTAL, T3FREE, THYROIDAB in the last 72 hours.  Invalid input(s): FREET3 ------------------------------------------------------------------------------------------------------------------ No results for input(s): VITAMINB12, FOLATE, FERRITIN, TIBC, IRON, RETICCTPCT in the last 72 hours.  Coagulation profile  Recent Labs Lab 12/29/16 0954  INR 0.96    No results for input(s): DDIMER in the last 72 hours.  Cardiac Enzymes No results for input(s): CKMB, TROPONINI, MYOGLOBIN in the last 168 hours.  Invalid input(s): CK ------------------------------------------------------------------------------------------------------------------ Invalid input(s): POCBNP   CBG:  Recent Labs Lab 12/29/16 1040  GLUCAP 98       Studies: Ct Angio Head W Or Wo Contrast  Result Date: 12/29/2016 CLINICAL DATA:  51 year old female code stroke presentation. Left side neurologic deficits. Last seen normal approximately 1700 hours yesterday. EXAM: CT ANGIOGRAPHY HEAD AND NECK CT PERFUSION BRAIN TECHNIQUE: Multidetector CT imaging of the head and neck was performed using the standard protocol during bolus administration of intravenous contrast.  Multiplanar CT image reconstructions and MIPs were obtained to evaluate the vascular anatomy. Carotid stenosis measurements (when applicable) are obtained utilizing NASCET criteria, using the distal internal carotid diameter as the denominator. Multiphase CT imaging of the brain was performed following IV bolus contrast injection. Subsequent parametric perfusion maps were calculated using RAPID software. CONTRAST:  90 mL Isovue 370 COMPARISON:  Noncontrast head CT 1016 hours today. FINDINGS: CT Brain Perfusion Findings: Mild motion artifact which was subtracted automatically during the venous phase of the exam. CBF (<30%) Volume:  0 mL Perfusion (Tmax>6.0s) volume: 16 mL, located along the anterior right MCA division including inferior frontal lobe and some of the right temporal lobe. Mismatch Volume:  Not applicable Infarction Location: No core infarct suggested by CT perfusion. CTA NECK Skeleton: No acute osseous abnormality identified. Upper chest: Dependent opacity in the visible lungs compatible with atelectasis. No superior mediastinal lymphadenopathy. Other neck: Within normal limits, no cervical lymphadenopathy. Aortic arch: 3 vessel arch configuration. No arch or great vessel origin atherosclerosis. Right carotid system: Negative right CCA aside from mild tortuosity. Normal right carotid bifurcation. Negative cervical right ICA. Left carotid system: Mild tortuosity of the proximal left CCA. Normal left carotid bifurcation. Negative cervical left ICA. Vertebral arteries: No proximal subclavian artery plaque or stenosis. Mildly tortuous proximal left subclavian. Dominant left vertebral artery. Normal vertebral artery origins. Tortuous left V1 segment. No vertebral artery atherosclerosis or stenosis identified in the neck. CTA HEAD Posterior circulation: Dominant distal left vertebral artery functionally supplies the basilar. Normal PICA origins. No basilar stenosis. Normal SCA and PCA origins. Diminutive  posterior communicating arteries. Normal PCA branches. Anterior circulation: Both ICA siphons are normal. Patent carotid termini. Normal ophthalmic and posterior communicating artery  origins. Normal MCA and ACA origins. Anterior communicating artery and bilateral ACA branches are within normal limits. Left MCA M1 segment, bifurcation, and left MCA branches appear within normal limits. The right MCA M1 segment is patent. A relatively early right MCA bifurcation occurs. Along the anterior superior aspect of this bifurcation there appears to be a truncated right MCA artery best seen on series 12, image 73 and series 10, image 123. It is unclear whether this is an M2 or proximal M3 branch. Otherwise, the right MCA branches appear normal. Venous sinuses: Patent. Anatomic variants: Dominant left vertebral artery. Delayed phase: Stable gray-white matter differentiation throughout the brain. No cortically based acute infarct identified. No abnormal enhancement identified. Review of the MIP images confirms the above findings IMPRESSION: 1. Suspected acute arterial occlusion along the anterior division of the right MCA, but it is unclear whether this is a smaller M2 or a proximal M3 branch. 2. CT perfusion indicates a small volume of anterior division right MCA territory penumbra (16 mL) concordant with #1, but is negative for evidence of core infarct at this time. 3. Otherwise negative CTA head and neck arterial findings; no atherosclerosis or other arterial abnormality identified. 4. Stable CT appearance of the brain since 1016 hours today. 5. This study was reviewed in person with Dr. Ritta Slot between 1035 and 1045 hours. Electronically Signed   By: Odessa Fleming M.D.   On: 12/29/2016 11:07   Ct Angio Neck W And/or Wo Contrast  Result Date: 12/29/2016 CLINICAL DATA:  51 year old female code stroke presentation. Left side neurologic deficits. Last seen normal approximately 1700 hours yesterday. EXAM: CT ANGIOGRAPHY  HEAD AND NECK CT PERFUSION BRAIN TECHNIQUE: Multidetector CT imaging of the head and neck was performed using the standard protocol during bolus administration of intravenous contrast. Multiplanar CT image reconstructions and MIPs were obtained to evaluate the vascular anatomy. Carotid stenosis measurements (when applicable) are obtained utilizing NASCET criteria, using the distal internal carotid diameter as the denominator. Multiphase CT imaging of the brain was performed following IV bolus contrast injection. Subsequent parametric perfusion maps were calculated using RAPID software. CONTRAST:  90 mL Isovue 370 COMPARISON:  Noncontrast head CT 1016 hours today. FINDINGS: CT Brain Perfusion Findings: Mild motion artifact which was subtracted automatically during the venous phase of the exam. CBF (<30%) Volume:  0 mL Perfusion (Tmax>6.0s) volume: 16 mL, located along the anterior right MCA division including inferior frontal lobe and some of the right temporal lobe. Mismatch Volume:  Not applicable Infarction Location: No core infarct suggested by CT perfusion. CTA NECK Skeleton: No acute osseous abnormality identified. Upper chest: Dependent opacity in the visible lungs compatible with atelectasis. No superior mediastinal lymphadenopathy. Other neck: Within normal limits, no cervical lymphadenopathy. Aortic arch: 3 vessel arch configuration. No arch or great vessel origin atherosclerosis. Right carotid system: Negative right CCA aside from mild tortuosity. Normal right carotid bifurcation. Negative cervical right ICA. Left carotid system: Mild tortuosity of the proximal left CCA. Normal left carotid bifurcation. Negative cervical left ICA. Vertebral arteries: No proximal subclavian artery plaque or stenosis. Mildly tortuous proximal left subclavian. Dominant left vertebral artery. Normal vertebral artery origins. Tortuous left V1 segment. No vertebral artery atherosclerosis or stenosis identified in the neck. CTA  HEAD Posterior circulation: Dominant distal left vertebral artery functionally supplies the basilar. Normal PICA origins. No basilar stenosis. Normal SCA and PCA origins. Diminutive posterior communicating arteries. Normal PCA branches. Anterior circulation: Both ICA siphons are normal. Patent carotid termini. Normal ophthalmic and posterior  communicating artery origins. Normal MCA and ACA origins. Anterior communicating artery and bilateral ACA branches are within normal limits. Left MCA M1 segment, bifurcation, and left MCA branches appear within normal limits. The right MCA M1 segment is patent. A relatively early right MCA bifurcation occurs. Along the anterior superior aspect of this bifurcation there appears to be a truncated right MCA artery best seen on series 12, image 73 and series 10, image 123. It is unclear whether this is an M2 or proximal M3 branch. Otherwise, the right MCA branches appear normal. Venous sinuses: Patent. Anatomic variants: Dominant left vertebral artery. Delayed phase: Stable gray-white matter differentiation throughout the brain. No cortically based acute infarct identified. No abnormal enhancement identified. Review of the MIP images confirms the above findings IMPRESSION: 1. Suspected acute arterial occlusion along the anterior division of the right MCA, but it is unclear whether this is a smaller M2 or a proximal M3 branch. 2. CT perfusion indicates a small volume of anterior division right MCA territory penumbra (16 mL) concordant with #1, but is negative for evidence of core infarct at this time. 3. Otherwise negative CTA head and neck arterial findings; no atherosclerosis or other arterial abnormality identified. 4. Stable CT appearance of the brain since 1016 hours today. 5. This study was reviewed in person with Dr. Ritta Slot between 1035 and 1045 hours. Electronically Signed   By: Odessa Fleming M.D.   On: 12/29/2016 11:07   Mr Brain Wo Contrast  Result Date:  12/29/2016 CLINICAL DATA:  Acute onset LEFT extremity weakness and numbness beginning at 0830 hours. Slurred speech. Assess RIGHT MCA stroke. History of HIV and chronic headaches. EXAM: MRI HEAD WITHOUT CONTRAST TECHNIQUE: Multiplanar, multiecho pulse sequences of the brain and surrounding structures were obtained without intravenous contrast. Axial T1, axial MPGR from at axial T1 and coronal T2 sequences not obtained. Patient was unable to complete examination due to back pain. COMPARISON:  CT perfusion December 29, 2016 at 1027 hours FINDINGS: BRAIN: No reduced diffusion to suggest acute ischemia. No susceptibility artifact to suggest hemorrhage. The ventricles and sulci are normal for patient's age. A few scattered subcentimeter supratentorial nonspecific white matter T2 hyperintensities, normal for age. No suspicious parenchymal signal, masses or mass effect. No abnormal extra-axial fluid collections. VASCULAR: Normal major intracranial vascular flow voids present at skull base. SKULL AND UPPER CERVICAL SPINE: No abnormal sellar expansion. No suspicious calvarial bone marrow signal. Craniocervical junction maintained. SINUSES/ORBITS: The mastoid air-cells and included paranasal sinuses are well-aerated. The included ocular globes and orbital contents are non-suspicious. Old LEFT medial orbital blowout fracture. OTHER: None. IMPRESSION: No acute intracranial process ; negative limited noncontrast MRI of the head (truncated examination due to patient pain). Electronically Signed   By: Awilda Metro M.D.   On: 12/29/2016 21:26   Ct Cerebral Perfusion W Contrast  Result Date: 12/29/2016 CLINICAL DATA:  51 year old female code stroke presentation. Left side neurologic deficits. Last seen normal approximately 1700 hours yesterday. EXAM: CT ANGIOGRAPHY HEAD AND NECK CT PERFUSION BRAIN TECHNIQUE: Multidetector CT imaging of the head and neck was performed using the standard protocol during bolus administration of  intravenous contrast. Multiplanar CT image reconstructions and MIPs were obtained to evaluate the vascular anatomy. Carotid stenosis measurements (when applicable) are obtained utilizing NASCET criteria, using the distal internal carotid diameter as the denominator. Multiphase CT imaging of the brain was performed following IV bolus contrast injection. Subsequent parametric perfusion maps were calculated using RAPID software. CONTRAST:  90 mL Isovue 370 COMPARISON:  Noncontrast head CT 1016 hours today. FINDINGS: CT Brain Perfusion Findings: Mild motion artifact which was subtracted automatically during the venous phase of the exam. CBF (<30%) Volume:  0 mL Perfusion (Tmax>6.0s) volume: 16 mL, located along the anterior right MCA division including inferior frontal lobe and some of the right temporal lobe. Mismatch Volume:  Not applicable Infarction Location: No core infarct suggested by CT perfusion. CTA NECK Skeleton: No acute osseous abnormality identified. Upper chest: Dependent opacity in the visible lungs compatible with atelectasis. No superior mediastinal lymphadenopathy. Other neck: Within normal limits, no cervical lymphadenopathy. Aortic arch: 3 vessel arch configuration. No arch or great vessel origin atherosclerosis. Right carotid system: Negative right CCA aside from mild tortuosity. Normal right carotid bifurcation. Negative cervical right ICA. Left carotid system: Mild tortuosity of the proximal left CCA. Normal left carotid bifurcation. Negative cervical left ICA. Vertebral arteries: No proximal subclavian artery plaque or stenosis. Mildly tortuous proximal left subclavian. Dominant left vertebral artery. Normal vertebral artery origins. Tortuous left V1 segment. No vertebral artery atherosclerosis or stenosis identified in the neck. CTA HEAD Posterior circulation: Dominant distal left vertebral artery functionally supplies the basilar. Normal PICA origins. No basilar stenosis. Normal SCA and PCA  origins. Diminutive posterior communicating arteries. Normal PCA branches. Anterior circulation: Both ICA siphons are normal. Patent carotid termini. Normal ophthalmic and posterior communicating artery origins. Normal MCA and ACA origins. Anterior communicating artery and bilateral ACA branches are within normal limits. Left MCA M1 segment, bifurcation, and left MCA branches appear within normal limits. The right MCA M1 segment is patent. A relatively early right MCA bifurcation occurs. Along the anterior superior aspect of this bifurcation there appears to be a truncated right MCA artery best seen on series 12, image 73 and series 10, image 123. It is unclear whether this is an M2 or proximal M3 branch. Otherwise, the right MCA branches appear normal. Venous sinuses: Patent. Anatomic variants: Dominant left vertebral artery. Delayed phase: Stable gray-white matter differentiation throughout the brain. No cortically based acute infarct identified. No abnormal enhancement identified. Review of the MIP images confirms the above findings IMPRESSION: 1. Suspected acute arterial occlusion along the anterior division of the right MCA, but it is unclear whether this is a smaller M2 or a proximal M3 branch. 2. CT perfusion indicates a small volume of anterior division right MCA territory penumbra (16 mL) concordant with #1, but is negative for evidence of core infarct at this time. 3. Otherwise negative CTA head and neck arterial findings; no atherosclerosis or other arterial abnormality identified. 4. Stable CT appearance of the brain since 1016 hours today. 5. This study was reviewed in person with Dr. Ritta Slot between 1035 and 1045 hours. Electronically Signed   By: Odessa Fleming M.D.   On: 12/29/2016 11:07   Ct Head Code Stroke W/o Cm  Result Date: 12/29/2016 CLINICAL DATA:  Code stroke. LEFT-sided weakness and slurred speech. History of HIV infection. EXAM: CT HEAD WITHOUT CONTRAST TECHNIQUE: Contiguous axial  images were obtained from the base of the skull through the vertex without intravenous contrast. COMPARISON:  None.  CTA head neck and CT perfusion are pending. FINDINGS: Brain: No evidence of acute infarction, hemorrhage, hydrocephalus, extra-axial collection or mass lesion/mass effect. Normal for age cerebral volume. No significant white matter disease. Vascular: No hyperdense vessel or unexpected calcification. Skull: Normal. Negative for fracture or focal lesion. Sinuses/Orbits: No acute finding. Other: None. ASPECTS [facility] Stroke Program Early CT Score) - Ganglionic level infarction (caudate, lentiform nuclei, internal  capsule, insula, M1-M3 cortex): 7 - Supraganglionic infarction (M4-M6 cortex): 3 Total score (0-10 with 10 being normal): 10 IMPRESSION: 1. Negative exam. 2. ASPECTS is 10. These results were texted via AMION at the time of interpretation on 12/29/2016 at 10:22 am to Dr. Amada Jupiter. Electronically Signed   By: Elsie Stain M.D.   On: 12/29/2016 10:23      No results found for: HGBA1C Lab Results  Component Value Date   LDLCALC 74 12/30/2016   CREATININE 1.00 12/29/2016       Scheduled Meds: . aspirin  300 mg Rectal Daily   Or  . aspirin  325 mg Oral Daily  . buPROPion  300 mg Oral Daily  . dolutegravir  50 mg Oral Daily  . emtricitabine-rilpivir-tenofovir AF  1 tablet Oral Q breakfast  . ferrous sulfate  325 mg Oral Q breakfast  . heparin  5,000 Units Subcutaneous Q8H  . loratadine  10 mg Oral Daily  . QUEtiapine  400 mg Oral QHS  . topiramate  100 mg Oral Daily   Continuous Infusions: . sodium chloride 50 mL/hr at 12/29/16 1728     LOS: 1 day    Time spent: >30 MINS    Richarda Overlie  [facility] Pager [phone]. If 7PM-7AM, please contact night-coverage at [email or web address], password [facility] 12/30/2016, 9:25 AM  LOS: 1 day

## 2016-12-30 NOTE — Evaluation (Signed)
Physical Therapy Evaluation Patient Details Name: Tracy KaufmannYavohnda [name] MRN: [id number] DOB: 1965-10-02 Today's Date: 12/30/2016   History of Present Illness  Pt is a 51 yo female admitted to the ED for sudden onset of left upper and lower extremity weakness and numbness. Past medical history signicant for depression and HIV. 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   Clinical Impression  Pt presents with decreased functional mobility and L sided weakness secondary to CVA. Prior to admission, pt was very independent and taking care of her 50 year old granddaughter during the day. Pt presents with decreased LLE ROM with 2-/5 strength grossly for knee flexion and ankle DF. Pt required mod A for bed mobility and max A for sit to stand transitions. Pt able to maintain static standing balance. Pt attempted 2 steps with RW and required max A due to L foot drag and decreased WB toward the L. Pt is very motivated to return to prior level of function. PT recommending CIR due to pt demonstrating good activity tolerance and good potential to improve functional mobility in order to return to independence upon d/c home. PT will continue to follow acutely.     Follow Up Recommendations CIR;Supervision for mobility/OOB    Equipment Recommendations  Other (comment) (Determine at next level of care)    Recommendations for Other Services Rehab consult     Precautions / Restrictions Precautions Precautions: Fall Restrictions Weight Bearing Restrictions: No      Mobility  Bed Mobility Overal bed mobility: Needs Assistance Bed Mobility: Supine to Sit     Supine to sit: Mod assist     General bed mobility comments: Pt requires mod A for supine to sit with pt provided assistance using bed rails. Pt with difficulty scooting due to decreased LLE ROM  Transfers Overall transfer level: Needs assistance Equipment used: Rolling walker (2 wheeled) Transfers: Sit to/from  Stand Sit to Stand: Max assist         General transfer comment: Pt requires VC for hand placement on RW. Max A for power up and to maintain intial standing. PT provided VCs for upright posture and maintain WB through LLE.  Ambulation/Gait Ambulation/Gait assistance: Max assist;+2 physical assistance           General Gait Details: Pt able to take 2 steps with RW and max A. Pt with L foot drag and decreased WB through LLE. Pt able to maintain standing with BUE   Stairs            Wheelchair Mobility    Modified Rankin (Stroke Patients Only) Modified Rankin (Stroke Patients Only) Pre-Morbid Rankin Score: No symptoms Modified Rankin: Severe disability     Balance Overall balance assessment: Needs assistance Sitting-balance support: Bilateral upper extremity supported;Feet supported Sitting balance-Leahy Scale: Poor Sitting balance - Comments: Pt required BUE support to maintain sitting balance   Standing balance support: Bilateral upper extremity supported;During functional activity Standing balance-Leahy Scale: Poor Standing balance comment: Pt able to static stand with BUE, but requires max A during mobility                             Pertinent Vitals/Pain Pain Assessment: No/denies pain    Home Living Family/patient expects to be discharged to:: Private residence Living Arrangements: Spouse/significant other Available Help at Discharge: Family Type of Home: Apartment Home Access: Stairs to enter Entrance Stairs-Rails: None Entrance Stairs-Number of Steps: 7 Home Layout:  Two level Home Equipment: Shower seat - built in      Prior Function Level of Independence: Independent         Comments: Pt very independent prior to admission and 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 Pt with 2/5 strength grossly on the LLE. LLE Sensation: decreased light touch LLE Coordination: decreased gross motor       Communication   Communication: Expressive difficulties  Cognition Arousal/Alertness: Awake/alert Behavior During Therapy: WFL for tasks assessed/performed Overall Cognitive Status: Within Functional Limits for tasks assessed                                 General Comments: Difficulty with speech      General Comments General comments (skin integrity, edema, etc.): Pt's family present during session and able to provide accurate home set up and PLOF information    Exercises     Assessment/Plan    PT Assessment Patient needs continued PT services  PT Problem List Decreased strength;Decreased range of motion;Decreased activity tolerance;Decreased balance;Decreased mobility;Decreased coordination;Decreased knowledge of use of DME;Decreased safety awareness;Decreased knowledge of precautions;Impaired sensation       PT Treatment Interventions DME instruction;Gait training;Stair training;Functional mobility training;Therapeutic activities;Therapeutic exercise;Balance training;Neuromuscular re-education;Patient/family education    PT Goals (Current goals can be found in the Care Plan section)  Acute Rehab PT Goals Patient Stated Goal: to get better PT Goal Formulation: With patient Time For Goal Achievement: 01/13/17 Potential to Achieve Goals: Good    Frequency Min 3X/week   Barriers to discharge        Co-evaluation               AM-PAC PT "6 Clicks" Daily Activity  Outcome Measure Difficulty turning over in bed (including adjusting bedclothes, sheets and blankets)?: Total Difficulty moving from lying on back to sitting  on the side of the bed? : Total Difficulty sitting down on and standing up from a chair with arms (e.g., wheelchair, bedside commode, etc,.)?: Total Help needed moving to and from a bed to chair (including a wheelchair)?: Total Help needed walking in hospital room?: Total Help needed climbing 3-5 steps with a railing? : Total 6 Click Score: 6    End of Session Equipment Utilized During Treatment: Gait belt Activity Tolerance: Treatment limited secondary to medical complications (Comment) (Pt very motivated, but limited by L sided weakness) Patient left: in bed;with call bell/phone within reach;with bed alarm set;with family/visitor present Nurse Communication: Mobility status PT Visit Diagnosis: Other abnormalities of gait and mobility (R26.89);Muscle weakness (generalized) (M62.81);Hemiplegia and hemiparesis Hemiplegia - Right/Left: Left Hemiplegia - dominant/non-dominant: Non-dominant Hemiplegia - caused by: Cerebral infarction    Time: 1012-1030 PT Time Calculation (min) (ACUTE ONLY): 18 min   Charges:   PT Evaluation $PT Eval Moderate Complexity: 1 Procedure     PT G CodesCorlis [name], SPT  [id number]  Tracy [name] 12/30/2016, 10:41 AM

## 2016-12-30 NOTE — Progress Notes (Signed)
Rehab Admissions Coordinator Note:  Patient was screened by Clois DupesBoyette, Yzabelle Calles Godwin for appropriateness for an Inpatient Acute Rehab Consult per PT and OT recommendations. .  At this time, we are recommending Inpatient Rehab consult. Please place order for consult.  Clois DupesBoyette, Roopa Graver Godwin 12/30/2016, 12:40 PM  I can be reached at [phone].

## 2016-12-30 NOTE — Progress Notes (Addendum)
STROKE TEAM PROGRESS NOTE   HISTORY OF PRESENT ILLNESS (per record) Tracy [name] is a 51 y.o. female endorsing one week of left arm pain, who noticed worsening weakness starting yesterday around 5pm. On the morning of 12/29/2016 she was still able to walk and talk, but began having slurred speech and then appears to have developed aphasia since arriving in the emergency department.  Given the presence of weakness and aphasia, a code stroke was activated per the extended time window guidelines. LKW: 5pm yesterday.   Patient was not administered IV t-PA secondary to arriving outside the treatment window. She was admitted to General Neurology for further evaluation and treatment.   SUBJECTIVE (INTERVAL HISTORY) Her family is at the bedside.  Patient is a poor historian.  She clarified that much of the limited range of motion in her left and left leg and left shoulder is due to pain and tension.  She was able to raise her left arm to the level of her left shoulder, and was able to walk from her chair to the door using a walker.  The patient's gait is somewhat limited by reduced flexion of the left knee.  On exam today, the patient is speaking in monotone with fluent speech and intact comprehension.  Patient denies any stress and anxiety at home but she did have recent concern of breast cancer but biopsy turned out to be negative. She was at home babysitting her granddaughter. Her left arm pain may be the trigger for her anxiety or stress, which may need outpatient EMG/NCS.   OBJECTIVE Temp:  [97.8 F (36.6 C)-98.2 F (36.8 C)] 98 F (36.7 C) (06/20 1336) Pulse Rate:  [65-87] 80 (06/20 1336) Cardiac Rhythm: Normal sinus rhythm (06/20 0703) Resp:  [18-20] 20 (06/20 1336) BP: (104-120)/(57-77) 111/71 (06/20 1336) SpO2:  [96 %-100 %] 97 % (06/20 1336)  CBC:  Recent Labs Lab 12/29/16 0954 12/29/16 1003  WBC 6.1  --   NEUTROABS 3.6  --   HGB 13.3 13.3  HCT 40.2 39.0  MCV 95.0  --   PLT  228  --     Basic Metabolic Panel:  Recent Labs Lab 12/29/16 0954 12/29/16 1003  NA 139 141  K 3.8 3.8  CL 108 106  CO2 22  --   GLUCOSE 109* 105*  BUN 7 7  CREATININE 1.08* 1.00  CALCIUM 9.1  --     Lipid Panel:    Component Value Date/Time   CHOL 160 12/30/2016 0506   TRIG 182 (H) 12/30/2016 0506   HDL 50 12/30/2016 0506   CHOLHDL 3.2 12/30/2016 0506   VLDL 36 12/30/2016 0506   LDLCALC 74 12/30/2016 0506   HgbA1c: No results found for: HGBA1C Urine Drug Screen:    Component Value Date/Time   LABOPIA NONE DETECTED 12/29/2016 1133   COCAINSCRNUR NONE DETECTED 12/29/2016 1133   LABBENZ NONE DETECTED 12/29/2016 1133   AMPHETMU NONE DETECTED 12/29/2016 1133   THCU NONE DETECTED 12/29/2016 1133   LABBARB NONE DETECTED 12/29/2016 1133    Alcohol Level     Component Value Date/Time   ETH <5 12/29/2016 0954    IMAGING I have personally reviewed the radiological images below and agree with the radiology interpretations. Read text is my interpretation  Ct Angio Head W AND Wo Contrast Ct Cerebral Perfusion W Contrast 12/29/2016 IMPRESSION: 1. Suspected acute arterial occlusion along the anterior division of the right MCA, but it is unclear whether this is a smaller M2 or a proximal  M3 branch. 2. CT perfusion indicates a small volume of anterior division right MCA territory penumbra (16 mL) concordant with #1, but is negative for evidence of core infarct at this time. 3. Otherwise negative CTA head and neck arterial findings; no atherosclerosis or other arterial abnormality identified. 4. Stable CT appearance of the brain since 1016 hours today. However, on my review of the images, no significant right MCA occlusion noted.  Mr Brain Wo Contrast 12/29/2016 IMPRESSION: No acute intracranial process ; negative limited noncontrast MRI of the head (truncated examination due to patient pain).  Ct Head Code Stroke W/o Cm  12/29/2016  IMPRESSION: 1. Negative exam. 2. ASPECTS is 10.    2D Echo 12/30/2016 Study Conclusions: - Left ventricle: The cavity size was normal. Wall thickness was increased in a pattern of mild LVH. Systolic function was normal.  The estimated ejection fraction was in the range of 60% to 65%.  Wall motion was normal; there were no regional wall motion abnormalities. Left ventricular diastolic function parameters were normal.   PHYSICAL EXAM  Temp:  [97.8 F (36.6 C)-98.2 F (36.8 C)] 98 F (36.7 C) (06/20 1336) Pulse Rate:  [65-87] 80 (06/20 1336) Resp:  [18-20] 20 (06/20 1336) BP: (104-120)/(57-77) 111/71 (06/20 1336) SpO2:  [96 %-100 %] 97 % (06/20 1336)  General - Well nourished, well developed, in mild distress due to left arm pain.  Ophthalmologic - Fundi not visualized due to noncooperation.  Cardiovascular - Regular rate and rhythm.  Mental Status -  Level of arousal and orientation to time, place, and person were intact. Language exam showed patient speaks in monotone, no aphasia, fluent speech, follows commands, and no dysarthria. Intermittent stuttering.  Cranial Nerves II - XII - II - Visual field intact OU. III, IV, VI - Extraocular movements intact. V - Facial sensation intact bilaterally. VII - Facial movement intact bilaterally. VIII - Hearing & vestibular intact bilaterally. X - Palate elevates symmetrically. XI - Chin turning & shoulder shrug intact bilaterally. XII - Tongue protrusion intact.  Motor Strength - The patient's strength was normal in RUE and RLE, however, lack of effort in L UE and LLE with giveaway weakness. Left shoulder pain also limited LUE movement. With prompting, patient is able to raise up LUE to the shoulder level, and able to hold walker to walk without assistance. LLE able to bear body weight and walk with walker without bending left knee. Bulk was normal and fasciculations were absent.   Motor Tone - Muscle tone was assessed at the neck and appendages and was normal.  Reflexes - The  patient's reflexes were 1+ in all extremities and she had no pathological reflexes.  Sensory - Light touch, temperature/pinprick were assessed and were symmetrical. However, mild tenderness on touch at left shoulder.  Coordination - The patient had normal movements in the right hand with no ataxia or dysmetria.  Tremor was absent.  Gait and Station - able to stand up without assistance, able to hold walker to walk without assistance, lack of left knee bending during walk, slow and small stride.   ASSESSMENT/PLAN [name] is a 51 y.o. female with history of HIV and schizoaffective disorder presenting with left arm pain and weakness, slurred speech, and aphasia. She did not receive IV t-PA due to arriving outside of the treatment window.   Conversion disorder   Resultant  lack of effort with L UE and L LE muscle strengths, giveaway weakness  CT head: no stroke  MRI head:  no stroke  CTA head and neck - suspicious for left M2/M3 occlusion, however, I'm not convinced.  CT perfusion - small area of increased TTP at right frontal, likely artifact or slow blood flow in the region without clinical significance  2D Echo: EF 60-65%. No source of embolus, mild LVH.  LDL 74  HgbA1c pending  Heparin for VTE prophylaxis  Diet regular Room service appropriate? Yes; Fluid consistency: Thin  No antithrombotic prior to admission, now on aspirin 325 mg daily. However, Aspirin is not necessary from neurology standpoint.  Patient counseled to be compliant with her antithrombotic medications  Ongoing aggressive stroke risk factor management  Therapy recommendations:  IP Rehab Facility   Disposition:  Pending  Left arm pain  Likely due to radiculopathy  Patient has history of the back pain and neck pain  Will need outpatient EMG/NCS  Continue PT/OT  Hypertension  Stable Long-term BP goal normotensive  Tobacco abuse  Current smoker  Smoking cessation counseling  provided  Nicotine patch provided  Pt is willing to quit  Other Stroke Risk Factors   Obesity, Body mass index is 30.07 kg/m., recommend weight loss, diet and exercise as appropriate   Other Active Problems  HIV - on HARRT treatment  Hospital day # 1  Neurology will sign off. Please call with questions. Pt will follow up with carolyn martin NP at [facility] in about 6 weeks. Thanks for the consult.  Marvel Plan, MD PhD Stroke Neurology 12/30/2016 5:56 PM   To contact Stroke Continuity provider, please refer to [email or web address]. After hours, contact General Neurology

## 2016-12-30 NOTE — Progress Notes (Signed)
Occupational Therapy Evaluation Patient Details Name: Mauro KaufmannYavohnda Holck MRN: [id number] DOB: 05-Dec-1965 Today's Date: 12/30/2016    History of Present Illness Pt is a 51 yo female admitted to the ED for sudden onset of left upper and lower extremity weakness and numbness. Past medical history signicant for depression and HIV. 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    Clinical Impression   PTA, pt independent with mobility and ADL and babysat her 243 yo grandchild. Pt demonstrates inconsistent performance throughout assessment. Pt able to perform tasks automatically, but had greater difficulty performing tasks on command. Pt with giveaway response with MMT, but able to use LUE to donn sock and assist with bed mobility and transferring to recliner. Pt demonstrated great difficulty moving LLE on MMT, but able to lift leg off bed and hold it off floor during mobility. Pt required Max A with bed mobility with PT and was S during OT assessment. [facility]  At this time, due to functional deficits listed below, recommend CIR for rehab. Will follow acutely. Family present during assessment.     Follow Up Recommendations  Supervision/Assistance - 24 hour;CIR    Equipment Recommendations  3 in 1 bedside commode    Recommendations for Other Services Other (comment) (psych consult)     Precautions / Restrictions Precautions Precautions: Fall Restrictions Weight Bearing Restrictions: No      Mobility Bed Mobility Overal bed mobility: Needs Assistance Bed Mobility: Supine to Sit     Supine to sit: Supervision     General bed mobility comments: Pt using bedrails  Transfers Overall transfer level: Needs assistance Equipment used: Rolling walker (2 wheeled) Transfers: Sit to/from [employer]GI CorporationStand;Stand Pivot Transfers Sit to Stand: Min assist Stand pivot transfers: Mod assist (to steady pt) - good strength during LUE to support body weight during  transfer       General transfer comment: vc for hand placement using RW. Min A to steady to power up. During stand pivot trnsfer, pt lifting LLE off floor.    Balance Overall balance assessment: Needs assistance Sitting-balance support: Bilateral upper extremity supported;Feet supported Sitting balance-Leahy Scale: Good Sitting balance - Comments: sat EOB without BUE support   Standing balance support: Bilateral upper extremity supported;During functional activity Standing balance-Leahy Scale: Poor Standing balance comment: reliant on RW                           ADL either performed or assessed with clinical judgement   ADL Overall ADL's : Needs assistance/impaired Eating/Feeding: Modified independent   Grooming: Minimal assistance   Upper Body Bathing: Minimal assistance;Sitting   Lower Body Bathing: Moderate assistance   Upper Body Dressing : Minimal assistance   Lower Body Dressing: Moderate assistance Lower Body Dressing Details (indicate cue type and reason): able to donn/doff R sock. Unable to donn L sock Toilet Transfer: Moderate assistance;Stand-pivot;RW           Functional mobility during ADLs: Moderate assistance;Rolling walker;Cueing for safety General ADL Comments: Pt holding LLE off floor during trnasfer. Not dragginf foot at this time     Vision         Perception     Praxis      Pertinent Vitals/Pain Pain Assessment: No/denies pain     Hand Dominance Right   Extremity/Trunk Assessment Upper Extremity Assessment Upper Extremity Assessment: LUE deficits/detail LUE Deficits / Details: inconsistent movement with LUE. Giveaway. Pt would briefly hold up LUE,  then let it drop. Using L hand functionally without difficulty. Pt able to use L elbow at times. Inconsistent response LUE Coordination: decreased gross motor   Lower Extremity Assessment Lower Extremity Assessment: Generalized weakness;RLE deficits/detail;LLE  deficits/detail RLE Deficits / Details: RLE WFL  LLE Deficits / Details: inconsistnet movement LLE. Pt ableto lift LLE and move around in bed, demonstrating 4/5 hip flexors, but unable to move hip on command LLE Sensation: decreased light touch LLE Coordination: decreased gross motor   Cervical / Trunk Assessment Cervical / Trunk Assessment: Other exceptions (R lateral lean)   Communication Communication Communication: Expressive difficulties   Cognition Arousal/Alertness: Awake/alert Behavior During Therapy: WFL for tasks assessed/performed Overall Cognitive Status: Within Functional Limits for tasks assessed                                 General Comments: per pt speech changes   General Comments  Pt's family present during session and able to provide accurate home set up and PLOF information    Exercises     Shoulder Instructions      Home Living Family/patient expects to be discharged to:: Private residence Living Arrangements: Spouse/significant other Available Help at Discharge: Family Type of Home: Apartment Home Access: Stairs to enter Secretary/administrator of Steps: 7 Entrance Stairs-Rails: None Home Layout: Two level Alternate Level Stairs-Number of Steps: 10 Alternate Level Stairs-Rails: Right Bathroom Shower/Tub: Producer, television/film/video: Standard     Home Equipment: Shower seat - built in          Prior Functioning/Environment Level of Independence: Independent        Comments: Pt very independent prior to admission and 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        OT Problem List: Decreased strength;Decreased activity tolerance;Impaired balance (sitting and/or standing);Decreased safety awareness;Decreased knowledge of use of DME or AE;Obesity      OT Treatment/Interventions: Self-care/ADL training;Neuromuscular education;DME and/or AE instruction;Therapeutic activities;Cognitive  remediation/compensation;Patient/family education;Balance training    OT Goals(Current goals can be found in the care plan section) Acute Rehab OT Goals Patient Stated Goal: to get better OT Goal Formulation: With patient Time For Goal Achievement: 01/13/17 Potential to Achieve Goals: Good  OT Frequency: Min 2X/week   Barriers to D/C:            Co-evaluation              AM-PAC PT "6 Clicks" Daily Activity     Outcome Measure Help from another person eating meals?: None Help from another person taking care of personal grooming?: A Little Help from another person toileting, which includes using toliet, bedpan, or urinal?: A Lot Help from another person bathing (including washing, rinsing, drying)?: A Lot Help from another person to put on and taking off regular upper body clothing?: A Little Help from another person to put on and taking off regular lower body clothing?: A Lot 6 Click Score: 16   End of Session Equipment Utilized During Treatment: Gait belt;Rolling walker Nurse Communication: Mobility status  Activity Tolerance: Patient tolerated treatment well Patient left: in chair;with call bell/phone within reach;with family/visitor present  OT Visit Diagnosis: Other abnormalities of gait and mobility (R26.89);Muscle weakness (generalized) (M62.81)                Time: 1610-9604 OT Time Calculation (min): 21 min Charges:  OT General Charges $OT Visit: 1 Procedure OT Evaluation $OT Eval Moderate  Complexity: 1 Procedure G-Codes:     Karon Heckendorn, OT/L  [phone] 12/30/2016  Claudene Gatliff,HILLARY 12/30/2016, 11:53 AM

## 2016-12-30 NOTE — Care Management Note (Signed)
Case Management Note  Patient Details  Name: Mauro KaufmannYavohnda Gendreau MRN: [id number] Date of Birth: [birth year]  Subjective/Objective:    Pt in to r/o CVA. She is from home with her spouse.                Action/Plan: PT/OT recommending CIR. CM following for d/c disposition.   Expected Discharge Date:                  Expected Discharge Plan:  IP Rehab Facility  In-House Referral:     Discharge planning Services     Post Acute Care Choice:    Choice offered to:     DME Arranged:    DME Agency:     HH Arranged:    HH Agency:     Status of Service:  In process, will continue to follow  If discussed at Long Length of Stay Meetings, dates discussed:    Additional Comments:  Kermit BaloKelli F Ambert Virrueta, RN 12/30/2016, 4:11 PM

## 2016-12-30 NOTE — Progress Notes (Signed)
  2D Echocardiogram has been performed.  Amandalee Lacap T Deray Dawes 12/30/2016, 2:24 PM

## 2016-12-31 DIAGNOSIS — M5412 Radiculopathy, cervical region: Secondary | ICD-10-CM

## 2016-12-31 DIAGNOSIS — F449 Dissociative and conversion disorder, unspecified: Principal | ICD-10-CM

## 2016-12-31 LAB — COMPREHENSIVE METABOLIC PANEL
ALT: 26 U/L (ref 14–54)
AST: 24 U/L (ref 15–41)
Albumin: 3.8 g/dL (ref 3.5–5.0)
Alkaline Phosphatase: 58 U/L (ref 38–126)
Anion gap: 10 (ref 5–15)
BUN: 13 mg/dL (ref 6–20)
CHLORIDE: 107 mmol/L (ref 101–111)
CO2: 19 mmol/L — ABNORMAL LOW (ref 22–32)
CREATININE: 1.22 mg/dL — AB (ref 0.44–1.00)
Calcium: 8.8 mg/dL — ABNORMAL LOW (ref 8.9–10.3)
GFR, EST AFRICAN AMERICAN: 59 mL/min — AB (ref >60)
GFR, EST NON AFRICAN AMERICAN: 51 mL/min — AB (ref >60)
Glucose, Bld: 104 mg/dL — ABNORMAL HIGH (ref 65–99)
POTASSIUM: 3.6 mmol/L (ref 3.5–5.1)
Sodium: 136 mmol/L (ref 135–145)
Total Bilirubin: 0.5 mg/dL (ref 0.3–1.2)
Total Protein: 6.6 g/dL (ref 6.5–8.1)

## 2016-12-31 LAB — HEMOGLOBIN A1C
HEMOGLOBIN A1C: 5.4 % (ref 4.8–5.6)
MEAN PLASMA GLUCOSE: 108 mg/dL

## 2016-12-31 MED ORDER — NICOTINE 7 MG/24HR TD PT24: 7.0000 mg | MEDICATED_PATCH | Freq: Every day | TRANSDERMAL | 0 refills | Status: DC | PRN

## 2016-12-31 NOTE — Consult Note (Signed)
Physical Medicine and Rehabilitation Consult Reason for Consult: Left-sided weakness Referring Physician: [facility]   HPI: Tracy [name] is a 51 y.o. right handed female with history of HIV followed at [facility] primary care, chronic back pain, schizoaffective disorder. Per chart review patient lives with spouse independent prior to admission. Presented 12/29/2016 with left-sided weakness as well as slurred speech. CT/MRI of the brain negative acute changes. UDS negative. Patient did not receive TPA. CT Angio head and neck showed suspected acute arterial occlusion along the anterior division of the right MCA. CT cerebral perfusion scan showed small volume of anterior division right MCA territory penumbra but negative for evidence of core infarct. Echocardiogram with ejection fraction 65% no wall motion abnormalities. Patient did not receive TPA. Neurology follow-up question ischemic infarct versus conversion disorder with workup ongoing. Physical therapy evaluation completed with recommendations of rehabilitation consult.  Patient states she liked to go home and do outpatient or home health therapy  Review of Systems  Constitutional: Negative for chills and fever.  HENT: Negative for hearing loss.   Eyes: Negative for blurred vision and double vision.  Respiratory: Negative for cough and shortness of breath.   Cardiovascular: Negative for chest pain, palpitations and leg swelling.  Gastrointestinal: Positive for constipation. Negative for nausea and vomiting.  Genitourinary: Negative for dysuria, flank pain and hematuria.  Musculoskeletal: Positive for back pain.  Skin: Negative for rash.  Neurological: Positive for sensory change, weakness and headaches. Negative for seizures.  Psychiatric/Behavioral: Positive for depression.  All other systems reviewed and are negative.  Past Medical History:  Diagnosis Date  . Chronic back pain   . Chronic headaches   . Depression   . HIV infection  (HCC)   . Schizoaffective disorder [facility])    Past Surgical History:  Procedure Laterality Date  . ABDOMINAL HYSTERECTOMY    . BREAST EXCISIONAL BIOPSY Right 1990's  . BREAST SURGERY     Family History  Problem Relation Age of Onset  . Diabetes Mother   . Hypertension Mother   . Breast cancer Maternal Aunt    Social History:  reports that she has been smoking Cigarettes.  She has been smoking about 0.30 packs per day. She has never used smokeless tobacco. She reports that she does not drink alcohol or use drugs. Allergies:  Allergies  Allergen Reactions  . Tetracyclines & Related Itching  . Tramadol Itching  . Flexeril [Cyclobenzaprine] Rash  . Moxifloxacin Rash   Medications Prior to Admission  Medication Sig Dispense Refill  . buPROPion (WELLBUTRIN XL) 300 MG 24 hr tablet Take 1 tablet (300 mg total) by mouth daily. 30 tablet 6  . dolutegravir (TIVICAY) 50 MG tablet Take 1 tablet (50 mg total) by mouth daily. 30 tablet 11  . emtricitabine-rilpivir-tenofovir DF (COMPLERA) 200-25-300 MG tablet Take 1 tablet by mouth daily. 30 tablet 0  . ferrous sulfate 325 (65 FE) MG tablet Take 325 mg by mouth daily with breakfast.    . gabapentin (NEURONTIN) 300 MG capsule Take 300 mg by mouth 3 (three) times daily.    Marland Kitchen loratadine (CLARITIN) 10 MG tablet Take 10 mg by mouth daily.    . Multiple Vitamin (MULTIVITAMIN) tablet Take 1 tablet by mouth daily.    . Oxycodone HCl 10 MG TABS Take 10 mg by mouth 3 (three) times daily as needed.    Marland Kitchen QUEtiapine (SEROQUEL XR) 400 MG 24 hr tablet Take 1 tablet (400 mg total) by mouth at bedtime. 30 tablet 6  .  SUMAtriptan (IMITREX) 100 MG tablet Take 100 mg by mouth every 2 (two) hours as needed for migraine. May repeat in 2 hours if headache persists or recurs.    . topiramate (TOPAMAX) 100 MG tablet Take 1 tablet (100 mg total) by mouth daily. 30 tablet 6  . cephALEXin (KEFLEX) 500 MG capsule Take 1 capsule (500 mg total) by mouth 4 (four) times daily.  (Patient not taking: Reported on 12/29/2016) 40 capsule 0    Home: Home Living Family/patient expects to be discharged to:: Private residence Living Arrangements: Spouse/significant other Available Help at Discharge: Family Type of Home: Apartment Home Access: Stairs to enter Secretary/administrator of Steps: 7 Entrance Stairs-Rails: None Home Layout: Two level Alternate Level Stairs-Number of Steps: 10 Alternate Level Stairs-Rails: Right Bathroom Shower/Tub: Health visitor: Standard Home Equipment: Shower seat - built in  Functional History: Prior Function Level of Independence: Independent Comments: Pt very independent prior to admission and 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 Min A to steady to power up. During stand pivot trnsfer, pt lifting LLE off floor. Ambulation/Gait Ambulation/Gait assistance: Min assist Ambulation Distance (Feet): 18 Feet Assistive device: Rolling walker (2 wheeled) Gait Pattern/deviations: Step-through pattern, Decreased stride length, Trunk flexed, Narrow base of support General Gait Details: patient cued to flex knee during mobility as patient clearly locking into extension despite no evidence of tone on assessment LLE. Patient cued for relaxation, responded well with sequencing and cues. Min assist for stability and maximal encouragement    ADL: ADL Overall ADL's : Needs assistance/impaired Eating/Feeding: Modified  independent Grooming: Minimal assistance Upper Body Bathing: Minimal assistance, Sitting Lower Body Bathing: Moderate assistance Upper Body Dressing : Minimal assistance Lower Body Dressing: Moderate assistance Lower Body Dressing Details (indicate cue type and reason): able to donn/doff R sock. Unable to donn L sock Toilet Transfer: Moderate assistance, Stand-pivot, RW Functional mobility during ADLs: Moderate assistance, Rolling walker, Cueing for safety General ADL Comments: Pt holding LLE off floor during trnasfer. Not dragginf foot at this time  Cognition: Cognition Overall Cognitive Status: Within Functional Limits for tasks assessed Orientation Level: Oriented X4 Cognition Arousal/Alertness: Awake/alert Behavior During Therapy: WFL for tasks assessed/performed Overall Cognitive Status: Within Functional Limits for tasks assessed General Comments: per pt speech changes  Blood pressure 106/67, pulse 75, temperature 97.5 F (36.4 C), temperature source Oral, resp. rate 20, height 5\' 7"  (1.702 m), weight 87.1 kg (192 lb), SpO2 97 %. Physical Exam  Vitals reviewed. HENT:  Head: Normocephalic.  Eyes: EOM are normal.  Neck: Normal range of motion. Neck supple. No thyromegaly present.  Cardiovascular: Regular rhythm.   Respiratory: Effort normal and breath sounds normal. No respiratory distress.  GI: Soft. Bowel sounds are normal. She exhibits no distension.  Neurological: She is alert.  Oriented to person and place. Followed simple commands. Limited medical historian.  Skin: Skin is warm and dry.  Motor strength is 5/5 bilateral deltoid, biceps, triceps, grip 5/5 right hip flexor, knee extensor, ankle dorsiflexor 5/5 left hip flexor, knee extensor and 4/5, left ankle dorsiflexor. Sensation intact to light  touch bilateral upper and lower limbs  No results found for this or any previous visit (from the past 24 hour(s)). Ct Angio Head W Or Wo Contrast  Result Date:  12/29/2016 CLINICAL DATA:  51 year old female code stroke presentation. Left side neurologic deficits. Last seen normal approximately 1700 hours yesterday. EXAM: CT ANGIOGRAPHY HEAD AND NECK CT PERFUSION BRAIN TECHNIQUE: Multidetector CT imaging of the head and neck was performed using the standard protocol during bolus administration of intravenous contrast. Multiplanar CT image reconstructions and MIPs were obtained to evaluate the vascular anatomy. Carotid stenosis measurements (when applicable) are obtained utilizing NASCET criteria, using the distal internal carotid diameter as the denominator. Multiphase CT imaging of the brain was performed following IV bolus contrast injection. Subsequent parametric perfusion maps were calculated using RAPID software. CONTRAST:  90 mL Isovue 370 COMPARISON:  Noncontrast head CT 1016 hours today. FINDINGS: CT Brain Perfusion Findings: Mild motion artifact which was subtracted automatically during the venous phase of the exam. CBF (<30%) Volume:  0 mL Perfusion (Tmax>6.0s) volume: 16 mL, located along the anterior right MCA division including inferior frontal lobe and some of the right temporal lobe. Mismatch Volume:  Not applicable Infarction Location: No core infarct suggested by CT perfusion. CTA NECK Skeleton: No acute osseous abnormality identified. Upper chest: Dependent opacity in the visible lungs compatible with atelectasis. No superior mediastinal lymphadenopathy. Other neck: Within normal limits, no cervical lymphadenopathy. Aortic arch: 3 vessel arch configuration. No arch or great vessel origin atherosclerosis. Right carotid system: Negative right CCA aside from mild tortuosity. Normal right carotid bifurcation. Negative cervical right ICA. Left carotid system: Mild tortuosity of the proximal left CCA. Normal left carotid bifurcation. Negative cervical left ICA. Vertebral arteries: No proximal subclavian artery plaque or stenosis. Mildly tortuous proximal left  subclavian. Dominant left vertebral artery. Normal vertebral artery origins. Tortuous left V1 segment. No vertebral artery atherosclerosis or stenosis identified in the neck. CTA HEAD Posterior circulation: Dominant distal left vertebral artery functionally supplies the basilar. Normal PICA origins. No basilar stenosis. Normal SCA and PCA origins. Diminutive posterior communicating arteries. Normal PCA branches. Anterior circulation: Both ICA siphons are normal. Patent carotid termini. Normal ophthalmic and posterior communicating artery origins. Normal MCA and ACA origins. Anterior communicating artery and bilateral ACA branches are within normal limits. Left MCA M1 segment, bifurcation, and left MCA branches appear within normal limits. The right MCA M1 segment is patent. A relatively early right MCA bifurcation occurs. Along the anterior superior aspect of this bifurcation there appears to be a truncated right MCA artery best seen on series 12, image 73 and series 10, image 123. It is unclear whether this is an M2 or proximal M3 branch. Otherwise, the right MCA branches appear normal. Venous sinuses: Patent. Anatomic variants: Dominant left vertebral artery. Delayed phase: Stable gray-white matter differentiation throughout the brain. No cortically based acute infarct identified. No abnormal enhancement identified. Review of the MIP images confirms the above findings IMPRESSION: 1. Suspected acute arterial occlusion along the anterior division of the right MCA, but it is unclear whether this is a smaller M2 or a proximal M3 branch. 2. CT perfusion indicates a small volume of anterior division right MCA territory penumbra (16 mL) concordant with #1, but is negative for evidence of core infarct at this time. 3. Otherwise negative CTA head and neck arterial findings; no atherosclerosis or other arterial abnormality identified. 4. Stable CT appearance of the brain since 1016 hours today. 5. This study was reviewed in  person with  Dr. Ritta SlotMcNeill Kirkpatrick between 1035 and 1045 hours. Electronically Signed   By: Odessa FlemingH  Hall M.D.   On: 12/29/2016 11:07   Ct Angio Neck W And/or Wo Contrast  Result Date: 12/29/2016 CLINICAL DATA:  51 year old female code stroke presentation. Left side neurologic deficits. Last seen normal approximately 1700 hours yesterday. EXAM: CT ANGIOGRAPHY HEAD AND NECK CT PERFUSION BRAIN TECHNIQUE: Multidetector CT imaging of the head and neck was performed using the standard protocol during bolus administration of intravenous contrast. Multiplanar CT image reconstructions and MIPs were obtained to evaluate the vascular anatomy. Carotid stenosis measurements (when applicable) are obtained utilizing NASCET criteria, using the distal internal carotid diameter as the denominator. Multiphase CT imaging of the brain was performed following IV bolus contrast injection. Subsequent parametric perfusion maps were calculated using RAPID software. CONTRAST:  90 mL Isovue 370 COMPARISON:  Noncontrast head CT 1016 hours today. FINDINGS: CT Brain Perfusion Findings: Mild motion artifact which was subtracted automatically during the venous phase of the exam. CBF (<30%) Volume:  0 mL Perfusion (Tmax>6.0s) volume: 16 mL, located along the anterior right MCA division including inferior frontal lobe and some of the right temporal lobe. Mismatch Volume:  Not applicable Infarction Location: No core infarct suggested by CT perfusion. CTA NECK Skeleton: No acute osseous abnormality identified. Upper chest: Dependent opacity in the visible lungs compatible with atelectasis. No superior mediastinal lymphadenopathy. Other neck: Within normal limits, no cervical lymphadenopathy. Aortic arch: 3 vessel arch configuration. No arch or great vessel origin atherosclerosis. Right carotid system: Negative right CCA aside from mild tortuosity. Normal right carotid bifurcation. Negative cervical right ICA. Left carotid system: Mild tortuosity of the  proximal left CCA. Normal left carotid bifurcation. Negative cervical left ICA. Vertebral arteries: No proximal subclavian artery plaque or stenosis. Mildly tortuous proximal left subclavian. Dominant left vertebral artery. Normal vertebral artery origins. Tortuous left V1 segment. No vertebral artery atherosclerosis or stenosis identified in the neck. CTA HEAD Posterior circulation: Dominant distal left vertebral artery functionally supplies the basilar. Normal PICA origins. No basilar stenosis. Normal SCA and PCA origins. Diminutive posterior communicating arteries. Normal PCA branches. Anterior circulation: Both ICA siphons are normal. Patent carotid termini. Normal ophthalmic and posterior communicating artery origins. Normal MCA and ACA origins. Anterior communicating artery and bilateral ACA branches are within normal limits. Left MCA M1 segment, bifurcation, and left MCA branches appear within normal limits. The right MCA M1 segment is patent. A relatively early right MCA bifurcation occurs. Along the anterior superior aspect of this bifurcation there appears to be a truncated right MCA artery best seen on series 12, image 73 and series 10, image 123. It is unclear whether this is an M2 or proximal M3 branch. Otherwise, the right MCA branches appear normal. Venous sinuses: Patent. Anatomic variants: Dominant left vertebral artery. Delayed phase: Stable gray-white matter differentiation throughout the brain. No cortically based acute infarct identified. No abnormal enhancement identified. Review of the MIP images confirms the above findings IMPRESSION: 1. Suspected acute arterial occlusion along the anterior division of the right MCA, but it is unclear whether this is a smaller M2 or a proximal M3 branch. 2. CT perfusion indicates a small volume of anterior division right MCA territory penumbra (16 mL) concordant with #1, but is negative for evidence of core infarct at this time. 3. Otherwise negative CTA head  and neck arterial findings; no atherosclerosis or other arterial abnormality identified. 4. Stable CT appearance of the brain since 1016 hours today. 5. This study was reviewed in  person with Dr. Ritta Slot between 1035 and 1045 hours. Electronically Signed   By: Odessa Fleming M.D.   On: 12/29/2016 11:07   Mr Brain Wo Contrast  Result Date: 12/29/2016 CLINICAL DATA:  Acute onset LEFT extremity weakness and numbness beginning at 0830 hours. Slurred speech. Assess RIGHT MCA stroke. History of HIV and chronic headaches. EXAM: MRI HEAD WITHOUT CONTRAST TECHNIQUE: Multiplanar, multiecho pulse sequences of the brain and surrounding structures were obtained without intravenous contrast. Axial T1, axial MPGR from at axial T1 and coronal T2 sequences not obtained. Patient was unable to complete examination due to back pain. COMPARISON:  CT perfusion December 29, 2016 at 1027 hours FINDINGS: BRAIN: No reduced diffusion to suggest acute ischemia. No susceptibility artifact to suggest hemorrhage. The ventricles and sulci are normal for patient's age. A few scattered subcentimeter supratentorial nonspecific white matter T2 hyperintensities, normal for age. No suspicious parenchymal signal, masses or mass effect. No abnormal extra-axial fluid collections. VASCULAR: Normal major intracranial vascular flow voids present at skull base. SKULL AND UPPER CERVICAL SPINE: No abnormal sellar expansion. No suspicious calvarial bone marrow signal. Craniocervical junction maintained. SINUSES/ORBITS: The mastoid air-cells and included paranasal sinuses are well-aerated. The included ocular globes and orbital contents are non-suspicious. Old LEFT medial orbital blowout fracture. OTHER: None. IMPRESSION: No acute intracranial process ; negative limited noncontrast MRI of the head (truncated examination due to patient pain). Electronically Signed   By: Awilda Metro M.D.   On: 12/29/2016 21:26   Ct Cerebral Perfusion W  Contrast  Result Date: 12/29/2016 CLINICAL DATA:  51 year old female code stroke presentation. Left side neurologic deficits. Last seen normal approximately 1700 hours yesterday. EXAM: CT ANGIOGRAPHY HEAD AND NECK CT PERFUSION BRAIN TECHNIQUE: Multidetector CT imaging of the head and neck was performed using the standard protocol during bolus administration of intravenous contrast. Multiplanar CT image reconstructions and MIPs were obtained to evaluate the vascular anatomy. Carotid stenosis measurements (when applicable) are obtained utilizing NASCET criteria, using the distal internal carotid diameter as the denominator. Multiphase CT imaging of the brain was performed following IV bolus contrast injection. Subsequent parametric perfusion maps were calculated using RAPID software. CONTRAST:  90 mL Isovue 370 COMPARISON:  Noncontrast head CT 1016 hours today. FINDINGS: CT Brain Perfusion Findings: Mild motion artifact which was subtracted automatically during the venous phase of the exam. CBF (<30%) Volume:  0 mL Perfusion (Tmax>6.0s) volume: 16 mL, located along the anterior right MCA division including inferior frontal lobe and some of the right temporal lobe. Mismatch Volume:  Not applicable Infarction Location: No core infarct suggested by CT perfusion. CTA NECK Skeleton: No acute osseous abnormality identified. Upper chest: Dependent opacity in the visible lungs compatible with atelectasis. No superior mediastinal lymphadenopathy. Other neck: Within normal limits, no cervical lymphadenopathy. Aortic arch: 3 vessel arch configuration. No arch or great vessel origin atherosclerosis. Right carotid system: Negative right CCA aside from mild tortuosity. Normal right carotid bifurcation. Negative cervical right ICA. Left carotid system: Mild tortuosity of the proximal left CCA. Normal left carotid bifurcation. Negative cervical left ICA. Vertebral arteries: No proximal subclavian artery plaque or stenosis. Mildly  tortuous proximal left subclavian. Dominant left vertebral artery. Normal vertebral artery origins. Tortuous left V1 segment. No vertebral artery atherosclerosis or stenosis identified in the neck. CTA HEAD Posterior circulation: Dominant distal left vertebral artery functionally supplies the basilar. Normal PICA origins. No basilar stenosis. Normal SCA and PCA origins. Diminutive posterior communicating arteries. Normal PCA branches. Anterior circulation: Both ICA siphons are normal.  Patent carotid termini. Normal ophthalmic and posterior communicating artery origins. Normal MCA and ACA origins. Anterior communicating artery and bilateral ACA branches are within normal limits. Left MCA M1 segment, bifurcation, and left MCA branches appear within normal limits. The right MCA M1 segment is patent. A relatively early right MCA bifurcation occurs. Along the anterior superior aspect of this bifurcation there appears to be a truncated right MCA artery best seen on series 12, image 73 and series 10, image 123. It is unclear whether this is an M2 or proximal M3 branch. Otherwise, the right MCA branches appear normal. Venous sinuses: Patent. Anatomic variants: Dominant left vertebral artery. Delayed phase: Stable gray-white matter differentiation throughout the brain. No cortically based acute infarct identified. No abnormal enhancement identified. Review of the MIP images confirms the above findings IMPRESSION: 1. Suspected acute arterial occlusion along the anterior division of the right MCA, but it is unclear whether this is a smaller M2 or a proximal M3 branch. 2. CT perfusion indicates a small volume of anterior division right MCA territory penumbra (16 mL) concordant with #1, but is negative for evidence of core infarct at this time. 3. Otherwise negative CTA head and neck arterial findings; no atherosclerosis or other arterial abnormality identified. 4. Stable CT appearance of the brain since 1016 hours today. 5.  This study was reviewed in person with Dr. Ritta Slot between 1035 and 1045 hours. Electronically Signed   By: Odessa Fleming M.D.   On: 12/29/2016 11:07   Ct Head Code Stroke W/o Cm  Result Date: 12/29/2016 CLINICAL DATA:  Code stroke. LEFT-sided weakness and slurred speech. History of HIV infection. EXAM: CT HEAD WITHOUT CONTRAST TECHNIQUE: Contiguous axial images were obtained from the base of the skull through the vertex without intravenous contrast. COMPARISON:  None.  CTA head neck and CT perfusion are pending. FINDINGS: Brain: No evidence of acute infarction, hemorrhage, hydrocephalus, extra-axial collection or mass lesion/mass effect. Normal for age cerebral volume. No significant white matter disease. Vascular: No hyperdense vessel or unexpected calcification. Skull: Normal. Negative for fracture or focal lesion. Sinuses/Orbits: No acute finding. Other: None. ASPECTS [facility] Stroke Program Early CT Score) - Ganglionic level infarction (caudate, lentiform nuclei, internal capsule, insula, M1-M3 cortex): 7 - Supraganglionic infarction (M4-M6 cortex): 3 Total score (0-10 with 10 being normal): 10 IMPRESSION: 1. Negative exam. 2. ASPECTS is 10. These results were texted via AMION at the time of interpretation on 12/29/2016 at 10:22 am to Dr. Amada Jupiter. Electronically Signed   By: Elsie Stain M.D.   On: 12/29/2016 10:23    Assessment/Plan: Diagnosis: TIA versus conversion reaction.   1. Does the need for close, 24 hr/day medical supervision in concert with the patient's rehab needs make it unreasonable for this patient to be served in a less intensive setting? No 2. Co-Morbidities requiring supervision/potential complications: Probable cervical radiculopathy, history HIV 3. Due to safety, does the patient require 24 hr/day rehab nursing? No 4. Does the patient require coordinated care of a physician, rehab nurse, PT, OT to address physical and functional deficits in the context of the above  medical diagnosis(es)? No Addressing deficits in the following areas: balance, endurance, locomotion, bathing, dressing and toileting 5. Can the patient actively participate in an intensive therapy program of at least 3 hrs of therapy per day at least 5 days per week? Potentially 6. The potential for patient to make measurable gains while on inpatient rehab is Not applicable 7. Anticipated functional outcomes upon discharge from inpatient rehab are n/a  with PT, n/a with OT, n/a with SLP. 8. Estimated rehab length of stay to reach the above functional goals is: Not applicable 9. Anticipated D/C setting: Home 10. Anticipated post D/C treatments: HH therapy 11. Overall Rehab/Functional Prognosis: excellent  RECOMMENDATIONS: This patient's condition is appropriate for continued rehabilitative care in the following setting: [facility] Therapy Patient has agreed to participate in recommended program. Yes Note that insurance prior authorization may be required for reimbursement for recommended care.  Comment: Agree with neurology follow-up as outpatient. May need EMG/NCV, no inpatient rehabilitation needs identified  Erick Colace M.D. [facility] FAAPM&R (Sports Med, Neuromuscular Med) Diplomate Am Board of Electrodiagnostic Med  Charlton Amor., PA-C 12/31/2016

## 2016-12-31 NOTE — Discharge Summary (Addendum)
Physician Discharge Summary  Emmeline Winebarger MRN: [id number] DOB/AGE: 50-Aug-1967 50 y.o.  PCP: System, Pcp Not In   Admit date: 12/29/2016 Discharge date: 12/31/2016  Discharge Diagnoses:    Active Problems:   HIV disease ([place])   Schizoaffective disorder (HCC)   Chronic pain syndrome   Cigarette smoker   Stroke (cerebrum) (HCC)   Ischemic stroke (HCC)   Chronic headaches   Conversion disorder   Cervical radiculopathy    Follow-up recommendations Follow-up with PCP in 3-5 days , including all  additional recommended appointments as below Follow-up CBC, CMP in 3-5 days Patient being discharged to inpatient rehabilitation Pt will follow up with carolyn martin NP at [facility] in about 6 weeks     Current Discharge Medication List    START taking these medications   Details  nicotine (NICODERM CQ - DOSED IN MG/24 HR) 7 mg/24hr patch Place 1 patch (7 mg total) onto the skin daily as needed (nicotine craving). Qty: 28 patch, Refills: 0      CONTINUE these medications which have NOT CHANGED   Details  buPROPion (WELLBUTRIN XL) 300 MG 24 hr tablet Take 1 tablet (300 mg total) by mouth daily. Qty: 30 tablet, Refills: 6    dolutegravir (TIVICAY) 50 MG tablet Take 1 tablet (50 mg total) by mouth daily. Qty: 30 tablet, Refills: 11    emtricitabine-rilpivir-tenofovir DF (COMPLERA) 200-25-300 MG tablet Take 1 tablet by mouth daily. Qty: 30 tablet, Refills: 0   Associated Diagnoses: HIV disease (HCC)    ferrous sulfate 325 (65 FE) MG tablet Take 325 mg by mouth daily with breakfast.    gabapentin (NEURONTIN) 300 MG capsule Take 300 mg by mouth 3 (three) times daily.    loratadine (CLARITIN) 10 MG tablet Take 10 mg by mouth daily.    Multiple Vitamin (MULTIVITAMIN) tablet Take 1 tablet by mouth daily.    Oxycodone HCl 10 MG TABS Take 10 mg by mouth 3 (three) times daily as needed.    QUEtiapine (SEROQUEL XR) 400 MG 24 hr tablet Take 1 tablet (400 mg total) by mouth at  bedtime. Qty: 30 tablet, Refills: 6    SUMAtriptan (IMITREX) 100 MG tablet Take 100 mg by mouth every 2 (two) hours as needed for migraine. May repeat in 2 hours if headache persists or recurs.    topiramate (TOPAMAX) 100 MG tablet Take 1 tablet (100 mg total) by mouth daily. Qty: 30 tablet, Refills: 6      STOP taking these medications     cephALEXin (KEFLEX) 500 MG capsule          Discharge Condition: Stable   Discharge Instructions Get Medicines reviewed and adjusted: Please take all your medications with you for your next visit with your Primary MD  Please request your Primary MD to go over all hospital tests and procedure/radiological results at the follow up, please ask your Primary MD to get all Hospital records sent to his/her office.  If you experience worsening of your admission symptoms, develop shortness of breath, life threatening emergency, suicidal or homicidal thoughts you must seek medical attention immediately by calling 911 or calling your MD immediately if symptoms less severe.  You must read complete instructions/literature along with all the possible adverse reactions/side effects for all the Medicines you take and that have been prescribed to you. Take any new Medicines after you have completely understood and accpet all the possible adverse reactions/side effects.   Do not drive when taking Pain medications.   Do not  take more than prescribed Pain, Sleep and Anxiety Medications  Special Instructions: If you have smoked or chewed Tobacco in the last 2 yrs please stop smoking, stop any regular Alcohol and or any Recreational drug use.  Wear Seat belts while driving.  Please note  You were cared for by a hospitalist during your hospital stay. Once you are discharged, your primary care physician will handle any further medical issues. Please note that NO REFILLS for any discharge medications will be authorized once you are discharged, as it is  imperative that you return to your primary care physician (or establish a relationship with a primary care physician if you do not have one) for your aftercare needs so that they can reassess your need for medications and monitor your lab values.  Discharge Instructions    Ambulatory referral to Neurology    Complete by:  As directed    Follow up with stroke clinic Cecille Rubin preferred, if not available, then consider [facility] Chestnut, [facility] or Jaynee Eagles whoever is available) at [facility] in about 6-8 weeks. Thanks.       Allergies  Allergen Reactions  . Tetracyclines & Related Itching  . Tramadol Itching  . Flexeril [Cyclobenzaprine] Rash  . Moxifloxacin Rash      Disposition: CIR   Consults:  Neurology    Significant Diagnostic Studies:  Ct Angio Head W Or Wo Contrast  Result Date: 12/29/2016 CLINICAL DATA:  51 year old female code stroke presentation. Left side neurologic deficits. Last seen normal approximately 1700 hours yesterday. EXAM: CT ANGIOGRAPHY HEAD AND NECK CT PERFUSION BRAIN TECHNIQUE: Multidetector CT imaging of the head and neck was performed using the standard protocol during bolus administration of intravenous contrast. Multiplanar CT image reconstructions and MIPs were obtained to evaluate the vascular anatomy. Carotid stenosis measurements (when applicable) are obtained utilizing NASCET criteria, using the distal internal carotid diameter as the denominator. Multiphase CT imaging of the brain was performed following IV bolus contrast injection. Subsequent parametric perfusion maps were calculated using RAPID software. CONTRAST:  90 mL Isovue 370 COMPARISON:  Noncontrast head CT 1016 hours today. FINDINGS: CT Brain Perfusion Findings: Mild motion artifact which was subtracted automatically during the venous phase of the exam. CBF (<30%) Volume:  0 mL Perfusion (Tmax>6.0s) volume: 16 mL, located along the anterior right MCA division including inferior frontal lobe and some  of the right temporal lobe. Mismatch Volume:  Not applicable Infarction Location: No core infarct suggested by CT perfusion. CTA NECK Skeleton: No acute osseous abnormality identified. Upper chest: Dependent opacity in the visible lungs compatible with atelectasis. No superior mediastinal lymphadenopathy. Other neck: Within normal limits, no cervical lymphadenopathy. Aortic arch: 3 vessel arch configuration. No arch or great vessel origin atherosclerosis. Right carotid system: Negative right CCA aside from mild tortuosity. Normal right carotid bifurcation. Negative cervical right ICA. Left carotid system: Mild tortuosity of the proximal left CCA. Normal left carotid bifurcation. Negative cervical left ICA. Vertebral arteries: No proximal subclavian artery plaque or stenosis. Mildly tortuous proximal left subclavian. Dominant left vertebral artery. Normal vertebral artery origins. Tortuous left V1 segment. No vertebral artery atherosclerosis or stenosis identified in the neck. CTA HEAD Posterior circulation: Dominant distal left vertebral artery functionally supplies the basilar. Normal PICA origins. No basilar stenosis. Normal SCA and PCA origins. Diminutive posterior communicating arteries. Normal PCA branches. Anterior circulation: Both ICA siphons are normal. Patent carotid termini. Normal ophthalmic and posterior communicating artery origins. Normal MCA and ACA origins. Anterior communicating artery and bilateral ACA branches are within  normal limits. Left MCA M1 segment, bifurcation, and left MCA branches appear within normal limits. The right MCA M1 segment is patent. A relatively early right MCA bifurcation occurs. Along the anterior superior aspect of this bifurcation there appears to be a truncated right MCA artery best seen on series 12, image 73 and series 10, image 123. It is unclear whether this is an M2 or proximal M3 branch. Otherwise, the right MCA branches appear normal. Venous sinuses: Patent.  Anatomic variants: Dominant left vertebral artery. Delayed phase: Stable gray-white matter differentiation throughout the brain. No cortically based acute infarct identified. No abnormal enhancement identified. Review of the MIP images confirms the above findings IMPRESSION: 1. Suspected acute arterial occlusion along the anterior division of the right MCA, but it is unclear whether this is a smaller M2 or a proximal M3 branch. 2. CT perfusion indicates a small volume of anterior division right MCA territory penumbra (16 mL) concordant with #1, but is negative for evidence of core infarct at this time. 3. Otherwise negative CTA head and neck arterial findings; no atherosclerosis or other arterial abnormality identified. 4. Stable CT appearance of the brain since 1016 hours today. 5. This study was reviewed in person with Dr. Roland Rack between 1035 and 1045 hours. Electronically Signed   By: Genevie Ann M.D.   On: 12/29/2016 11:07   Ct Angio Neck W And/or Wo Contrast  Result Date: 12/29/2016 CLINICAL DATA:  51 year old female code stroke presentation. Left side neurologic deficits. Last seen normal approximately 1700 hours yesterday. EXAM: CT ANGIOGRAPHY HEAD AND NECK CT PERFUSION BRAIN TECHNIQUE: Multidetector CT imaging of the head and neck was performed using the standard protocol during bolus administration of intravenous contrast. Multiplanar CT image reconstructions and MIPs were obtained to evaluate the vascular anatomy. Carotid stenosis measurements (when applicable) are obtained utilizing NASCET criteria, using the distal internal carotid diameter as the denominator. Multiphase CT imaging of the brain was performed following IV bolus contrast injection. Subsequent parametric perfusion maps were calculated using RAPID software. CONTRAST:  90 mL Isovue 370 COMPARISON:  Noncontrast head CT 1016 hours today. FINDINGS: CT Brain Perfusion Findings: Mild motion artifact which was subtracted automatically  during the venous phase of the exam. CBF (<30%) Volume:  0 mL Perfusion (Tmax>6.0s) volume: 16 mL, located along the anterior right MCA division including inferior frontal lobe and some of the right temporal lobe. Mismatch Volume:  Not applicable Infarction Location: No core infarct suggested by CT perfusion. CTA NECK Skeleton: No acute osseous abnormality identified. Upper chest: Dependent opacity in the visible lungs compatible with atelectasis. No superior mediastinal lymphadenopathy. Other neck: Within normal limits, no cervical lymphadenopathy. Aortic arch: 3 vessel arch configuration. No arch or great vessel origin atherosclerosis. Right carotid system: Negative right CCA aside from mild tortuosity. Normal right carotid bifurcation. Negative cervical right ICA. Left carotid system: Mild tortuosity of the proximal left CCA. Normal left carotid bifurcation. Negative cervical left ICA. Vertebral arteries: No proximal subclavian artery plaque or stenosis. Mildly tortuous proximal left subclavian. Dominant left vertebral artery. Normal vertebral artery origins. Tortuous left V1 segment. No vertebral artery atherosclerosis or stenosis identified in the neck. CTA HEAD Posterior circulation: Dominant distal left vertebral artery functionally supplies the basilar. Normal PICA origins. No basilar stenosis. Normal SCA and PCA origins. Diminutive posterior communicating arteries. Normal PCA branches. Anterior circulation: Both ICA siphons are normal. Patent carotid termini. Normal ophthalmic and posterior communicating artery origins. Normal MCA and ACA origins. Anterior communicating artery and bilateral ACA branches  are within normal limits. Left MCA M1 segment, bifurcation, and left MCA branches appear within normal limits. The right MCA M1 segment is patent. A relatively early right MCA bifurcation occurs. Along the anterior superior aspect of this bifurcation there appears to be a truncated right MCA artery best seen  on series 12, image 73 and series 10, image 123. It is unclear whether this is an M2 or proximal M3 branch. Otherwise, the right MCA branches appear normal. Venous sinuses: Patent. Anatomic variants: Dominant left vertebral artery. Delayed phase: Stable gray-white matter differentiation throughout the brain. No cortically based acute infarct identified. No abnormal enhancement identified. Review of the MIP images confirms the above findings IMPRESSION: 1. Suspected acute arterial occlusion along the anterior division of the right MCA, but it is unclear whether this is a smaller M2 or a proximal M3 branch. 2. CT perfusion indicates a small volume of anterior division right MCA territory penumbra (16 mL) concordant with #1, but is negative for evidence of core infarct at this time. 3. Otherwise negative CTA head and neck arterial findings; no atherosclerosis or other arterial abnormality identified. 4. Stable CT appearance of the brain since 1016 hours today. 5. This study was reviewed in person with Dr. Roland Rack between 1035 and 1045 hours. Electronically Signed   By: Genevie Ann M.D.   On: 12/29/2016 11:07   Mr Brain Wo Contrast  Result Date: 12/29/2016 CLINICAL DATA:  Acute onset LEFT extremity weakness and numbness beginning at 0830 hours. Slurred speech. Assess RIGHT MCA stroke. History of HIV and chronic headaches. EXAM: MRI HEAD WITHOUT CONTRAST TECHNIQUE: Multiplanar, multiecho pulse sequences of the brain and surrounding structures were obtained without intravenous contrast. Axial T1, axial MPGR from at axial T1 and coronal T2 sequences not obtained. Patient was unable to complete examination due to back pain. COMPARISON:  CT perfusion December 29, 2016 at 1027 hours FINDINGS: BRAIN: No reduced diffusion to suggest acute ischemia. No susceptibility artifact to suggest hemorrhage. The ventricles and sulci are normal for patient's age. A few scattered subcentimeter supratentorial nonspecific white matter  T2 hyperintensities, normal for age. No suspicious parenchymal signal, masses or mass effect. No abnormal extra-axial fluid collections. VASCULAR: Normal major intracranial vascular flow voids present at skull base. SKULL AND UPPER CERVICAL SPINE: No abnormal sellar expansion. No suspicious calvarial bone marrow signal. Craniocervical junction maintained. SINUSES/ORBITS: The mastoid air-cells and included paranasal sinuses are well-aerated. The included ocular globes and orbital contents are non-suspicious. Old LEFT medial orbital blowout fracture. OTHER: None. IMPRESSION: No acute intracranial process ; negative limited noncontrast MRI of the head (truncated examination due to patient pain). Electronically Signed   By: Elon Alas M.D.   On: 12/29/2016 21:26   Ct Cerebral Perfusion W Contrast  Result Date: 12/29/2016 CLINICAL DATA:  51 year old female code stroke presentation. Left side neurologic deficits. Last seen normal approximately 1700 hours yesterday. EXAM: CT ANGIOGRAPHY HEAD AND NECK CT PERFUSION BRAIN TECHNIQUE: Multidetector CT imaging of the head and neck was performed using the standard protocol during bolus administration of intravenous contrast. Multiplanar CT image reconstructions and MIPs were obtained to evaluate the vascular anatomy. Carotid stenosis measurements (when applicable) are obtained utilizing NASCET criteria, using the distal internal carotid diameter as the denominator. Multiphase CT imaging of the brain was performed following IV bolus contrast injection. Subsequent parametric perfusion maps were calculated using RAPID software. CONTRAST:  90 mL Isovue 370 COMPARISON:  Noncontrast head CT 1016 hours today. FINDINGS: CT Brain Perfusion Findings: Mild motion artifact  which was subtracted automatically during the venous phase of the exam. CBF (<30%) Volume:  0 mL Perfusion (Tmax>6.0s) volume: 16 mL, located along the anterior right MCA division including inferior frontal lobe  and some of the right temporal lobe. Mismatch Volume:  Not applicable Infarction Location: No core infarct suggested by CT perfusion. CTA NECK Skeleton: No acute osseous abnormality identified. Upper chest: Dependent opacity in the visible lungs compatible with atelectasis. No superior mediastinal lymphadenopathy. Other neck: Within normal limits, no cervical lymphadenopathy. Aortic arch: 3 vessel arch configuration. No arch or great vessel origin atherosclerosis. Right carotid system: Negative right CCA aside from mild tortuosity. Normal right carotid bifurcation. Negative cervical right ICA. Left carotid system: Mild tortuosity of the proximal left CCA. Normal left carotid bifurcation. Negative cervical left ICA. Vertebral arteries: No proximal subclavian artery plaque or stenosis. Mildly tortuous proximal left subclavian. Dominant left vertebral artery. Normal vertebral artery origins. Tortuous left V1 segment. No vertebral artery atherosclerosis or stenosis identified in the neck. CTA HEAD Posterior circulation: Dominant distal left vertebral artery functionally supplies the basilar. Normal PICA origins. No basilar stenosis. Normal SCA and PCA origins. Diminutive posterior communicating arteries. Normal PCA branches. Anterior circulation: Both ICA siphons are normal. Patent carotid termini. Normal ophthalmic and posterior communicating artery origins. Normal MCA and ACA origins. Anterior communicating artery and bilateral ACA branches are within normal limits. Left MCA M1 segment, bifurcation, and left MCA branches appear within normal limits. The right MCA M1 segment is patent. A relatively early right MCA bifurcation occurs. Along the anterior superior aspect of this bifurcation there appears to be a truncated right MCA artery best seen on series 12, image 73 and series 10, image 123. It is unclear whether this is an M2 or proximal M3 branch. Otherwise, the right MCA branches appear normal. Venous sinuses:  Patent. Anatomic variants: Dominant left vertebral artery. Delayed phase: Stable gray-white matter differentiation throughout the brain. No cortically based acute infarct identified. No abnormal enhancement identified. Review of the MIP images confirms the above findings IMPRESSION: 1. Suspected acute arterial occlusion along the anterior division of the right MCA, but it is unclear whether this is a smaller M2 or a proximal M3 branch. 2. CT perfusion indicates a small volume of anterior division right MCA territory penumbra (16 mL) concordant with #1, but is negative for evidence of core infarct at this time. 3. Otherwise negative CTA head and neck arterial findings; no atherosclerosis or other arterial abnormality identified. 4. Stable CT appearance of the brain since 1016 hours today. 5. This study was reviewed in person with Dr. Roland Rack between 1035 and 1045 hours. Electronically Signed   By: Genevie Ann M.D.   On: 12/29/2016 11:07   Ct Head Code Stroke W/o Cm  Result Date: 12/29/2016 CLINICAL DATA:  Code stroke. LEFT-sided weakness and slurred speech. History of HIV infection. EXAM: CT HEAD WITHOUT CONTRAST TECHNIQUE: Contiguous axial images were obtained from the base of the skull through the vertex without intravenous contrast. COMPARISON:  None.  CTA head neck and CT perfusion are pending. FINDINGS: Brain: No evidence of acute infarction, hemorrhage, hydrocephalus, extra-axial collection or mass lesion/mass effect. Normal for age cerebral volume. No significant white matter disease. Vascular: No hyperdense vessel or unexpected calcification. Skull: Normal. Negative for fracture or focal lesion. Sinuses/Orbits: No acute finding. Other: None. ASPECTS [facility] Stroke Program Early CT Score) - Ganglionic level infarction (caudate, lentiform nuclei, internal capsule, insula, M1-M3 cortex): 7 - Supraganglionic infarction (M4-M6 cortex): 3 Total score (0-10  with 10 being normal): 10 IMPRESSION: 1.  Negative exam. 2. ASPECTS is 10. These results were texted via AMION at the time of interpretation on 12/29/2016 at 10:22 am to Dr. Leonel Ramsay. Electronically Signed   By: Staci Righter M.D.   On: 12/29/2016 10:23    echocardiogram  LV EF: 60% -   65%  ------------------------------------------------------------------- History:   PMH:  Stroke  Stroke.  ------------------------------------------------------------------- Study Conclusions  - Left ventricle: The cavity size was normal. Wall thickness was   increased in a pattern of mild LVH. Systolic function was normal.   The estimated ejection fraction was in the range of 60% to 65%.   Wall motion was normal; there were no regional wall motion   abnormalities. Left ventricular diastolic function parameters   were normal.      Filed Weights   12/29/16 1040  Weight: 87.1 kg (192 lb)     Microbiology: No results found for this or any previous visit (from the past 240 hour(s)).     Blood Culture No results found for: SDES, SPECREQUEST, CULT, REPTSTATUS    Labs: Results for orders placed or performed during the hospital encounter of 12/29/16 (from the past 48 hour(s))  Ethanol     Status: None   Collection Time: 12/29/16  9:54 AM  Result Value Ref Range   Alcohol, Ethyl (B) <5 <5 mg/dL    Comment:        LOWEST DETECTABLE LIMIT FOR SERUM ALCOHOL IS 5 mg/dL FOR MEDICAL PURPOSES ONLY   Protime-INR     Status: None   Collection Time: 12/29/16  9:54 AM  Result Value Ref Range   Prothrombin Time 12.8 11.4 - 15.2 seconds   INR 0.96   APTT     Status: None   Collection Time: 12/29/16  9:54 AM  Result Value Ref Range   aPTT 30 24 - 36 seconds  CBC     Status: None   Collection Time: 12/29/16  9:54 AM  Result Value Ref Range   WBC 6.1 4.0 - 10.5 K/uL   RBC 4.23 3.87 - 5.11 MIL/uL   Hemoglobin 13.3 12.0 - 15.0 g/dL   HCT 40.2 36.0 - 46.0 %   MCV 95.0 78.0 - 100.0 fL   MCH 31.4 26.0 - 34.0 pg   MCHC 33.1 30.0 -  36.0 g/dL   RDW 13.1 11.5 - 15.5 %   Platelets 228 150 - 400 K/uL  Differential     Status: None   Collection Time: 12/29/16  9:54 AM  Result Value Ref Range   Neutrophils Relative % 59 %   Neutro Abs 3.6 1.7 - 7.7 K/uL   Lymphocytes Relative 31 %   Lymphs Abs 1.9 0.7 - 4.0 K/uL   Monocytes Relative 9 %   Monocytes Absolute 0.5 0.1 - 1.0 K/uL   Eosinophils Relative 1 %   Eosinophils Absolute 0.1 0.0 - 0.7 K/uL   Basophils Relative 0 %   Basophils Absolute 0.0 0.0 - 0.1 K/uL  Comprehensive metabolic panel     Status: Abnormal   Collection Time: 12/29/16  9:54 AM  Result Value Ref Range   Sodium 139 135 - 145 mmol/L   Potassium 3.8 3.5 - 5.1 mmol/L   Chloride 108 101 - 111 mmol/L   CO2 22 22 - 32 mmol/L   Glucose, Bld 109 (H) 65 - 99 mg/dL   BUN 7 6 - 20 mg/dL   Creatinine, Ser 1.08 (H) 0.44 - 1.00 mg/dL  Calcium 9.1 8.9 - 10.3 mg/dL   Total Protein 6.3 (L) 6.5 - 8.1 g/dL   Albumin 3.8 3.5 - 5.0 g/dL   AST 22 15 - 41 U/L   ALT 17 14 - 54 U/L   Alkaline Phosphatase 63 38 - 126 U/L   Total Bilirubin 0.6 0.3 - 1.2 mg/dL   GFR calc non Af Amer 59 (L) >60 mL/min   GFR calc Af Amer >60 >60 mL/min    Comment: (NOTE) The eGFR has been calculated using the CKD EPI equation. This calculation has not been validated in all clinical situations. eGFR's persistently <60 mL/min signify possible Chronic Kidney Disease.    Anion gap 9 5 - 15  I-stat troponin, ED (not at [facility], [facility])     Status: None   Collection Time: 12/29/16 10:01 AM  Result Value Ref Range   Troponin i, poc 0.00 0.00 - 0.08 ng/mL   Comment 3            Comment: Due to the release kinetics of cTnI, a negative result within the first hours of the onset of symptoms does not rule out myocardial infarction with certainty. If myocardial infarction is still suspected, repeat the test at appropriate intervals.   I-Stat Chem 8, ED  (not at [facility], [facility])     Status: Abnormal   Collection Time: 12/29/16 10:03 AM  Result Value  Ref Range   Sodium 141 135 - 145 mmol/L   Potassium 3.8 3.5 - 5.1 mmol/L   Chloride 106 101 - 111 mmol/L   BUN 7 6 - 20 mg/dL   Creatinine, Ser 1.00 0.44 - 1.00 mg/dL   Glucose, Bld 105 (H) 65 - 99 mg/dL   Calcium, Ion 1.09 (L) 1.15 - 1.40 mmol/L   TCO2 22 0 - 100 mmol/L   Hemoglobin 13.3 12.0 - 15.0 g/dL   HCT 39.0 36.0 - 46.0 %  CBG monitoring, ED     Status: None   Collection Time: 12/29/16 10:40 AM  Result Value Ref Range   Glucose-Capillary 98 65 - 99 mg/dL  Urine rapid drug screen (hosp performed)not at [facility]     Status: None   Collection Time: 12/29/16 11:33 AM  Result Value Ref Range   Opiates NONE DETECTED NONE DETECTED   Cocaine NONE DETECTED NONE DETECTED   Benzodiazepines NONE DETECTED NONE DETECTED   Amphetamines NONE DETECTED NONE DETECTED   Tetrahydrocannabinol NONE DETECTED NONE DETECTED   Barbiturates NONE DETECTED NONE DETECTED    Comment:        DRUG SCREEN FOR MEDICAL PURPOSES ONLY.  IF CONFIRMATION IS NEEDED FOR ANY PURPOSE, NOTIFY LAB WITHIN 5 DAYS.        LOWEST DETECTABLE LIMITS FOR URINE DRUG SCREEN Drug Class       Cutoff (ng/mL) Amphetamine      1000 Barbiturate      200 Benzodiazepine   470 Tricyclics       962 Opiates          300 Cocaine          300 THC              50   Urinalysis, Routine w reflex microscopic     Status: Abnormal   Collection Time: 12/29/16 11:33 AM  Result Value Ref Range   Color, Urine STRAW (A) YELLOW   APPearance CLEAR CLEAR   Specific Gravity, Urine 1.024 1.005 - 1.030   pH 7.0 5.0 - 8.0   Glucose, UA  NEGATIVE NEGATIVE mg/dL   Hgb urine dipstick NEGATIVE NEGATIVE   Bilirubin Urine NEGATIVE NEGATIVE   Ketones, ur NEGATIVE NEGATIVE mg/dL   Protein, ur NEGATIVE NEGATIVE mg/dL   Nitrite NEGATIVE NEGATIVE   Leukocytes, UA NEGATIVE NEGATIVE  Hemoglobin A1c     Status: None   Collection Time: 12/30/16  5:06 AM  Result Value Ref Range   Hgb A1c MFr Bld 5.4 4.8 - 5.6 %    Comment: (NOTE)         Pre-diabetes: 5.7  - 6.4         Diabetes: >6.4         Glycemic control for adults with diabetes: <7.0    Mean Plasma Glucose 108 mg/dL    Comment: (NOTE) Performed At: [facility] [place], [place] [id number] Lindon Romp MD [id number]   Lipid panel     Status: Abnormal   Collection Time: 12/30/16  5:06 AM  Result Value Ref Range   Cholesterol 160 0 - 200 mg/dL   Triglycerides 182 (H) <150 mg/dL   HDL 50 >40 mg/dL   Total CHOL/HDL Ratio 3.2 RATIO   VLDL 36 0 - 40 mg/dL   LDL Cholesterol 74 0 - 99 mg/dL    Comment:        Total Cholesterol/HDL:CHD Risk Coronary Heart Disease Risk Table                     Men   Women  1/2 Average Risk   3.4   3.3  Average Risk       5.0   4.4  2 X Average Risk   9.6   7.1  3 X Average Risk  23.4   11.0        Use the calculated Patient Ratio above and the CHD Risk Table to determine the patient's CHD Risk.        ATP III CLASSIFICATION (LDL):  <100     mg/dL   Optimal  100-129  mg/dL   Near or Above                    Optimal  130-159  mg/dL   Borderline  160-189  mg/dL   High  >190     mg/dL   Very High   Comprehensive metabolic panel     Status: Abnormal   Collection Time: 12/31/16  5:43 AM  Result Value Ref Range   Sodium 136 135 - 145 mmol/L   Potassium 3.6 3.5 - 5.1 mmol/L   Chloride 107 101 - 111 mmol/L   CO2 19 (L) 22 - 32 mmol/L   Glucose, Bld 104 (H) 65 - 99 mg/dL   BUN 13 6 - 20 mg/dL   Creatinine, Ser 1.22 (H) 0.44 - 1.00 mg/dL   Calcium 8.8 (L) 8.9 - 10.3 mg/dL   Total Protein 6.6 6.5 - 8.1 g/dL   Albumin 3.8 3.5 - 5.0 g/dL   AST 24 15 - 41 U/L   ALT 26 14 - 54 U/L   Alkaline Phosphatase 58 38 - 126 U/L   Total Bilirubin 0.5 0.3 - 1.2 mg/dL   GFR calc non Af Amer 51 (L) >60 mL/min   GFR calc Af Amer 59 (L) >60 mL/min    Comment: (NOTE) The eGFR has been calculated using the CKD EPI equation. This calculation has not been validated in all clinical situations. eGFR's persistently <60 mL/min signify  possible Chronic Kidney  Disease.    Anion gap 10 5 - 15     Lipid Panel     Component Value Date/Time   CHOL 160 12/30/2016 0506   TRIG 182 (H) 12/30/2016 0506   HDL 50 12/30/2016 0506   CHOLHDL 3.2 12/30/2016 0506   VLDL 36 12/30/2016 0506   LDLCALC 74 12/30/2016 0506     Lab Results  Component Value Date   HGBA1C 5.4 12/30/2016       HPI :*   51 y.o.femalewith medical history significant of Depression, HIV. Patient is followed at a [facility] primary care office but gets her HIV treatment by Dr. Megan Salon. Patient states that she developed sudden onset left upper extremity left lower extremity weakness and numbness. Occurred at approximately 08:30. EMS was called to evaluate the patient and brought the patient to [facility]. While in route patient developed left facial weakness, droop and slurring of speech. Patient was evaluated by neurology Dr. Katherine Roan, after code stroke was activated. He recommended admission for evaluation of CVA.  Hospital course Conversion disorder. Possible CTA showing acute occlusion of R anterior MCA but negative study for infarct. Pt  stated that she had limited range of motion of her left arm, left leg and left shoulder due to pain and tension  - MRI does not show acute process  - Echo  within normal limits - PT/OT/-CIR , SLP okay - - A1c 5.4 , Lipids-LDL 74, triglycerides 182  Patient denies any stress or anxiety at home, Patient found to have giveaway weakness, appeared to be able to perform tasks automatically, but with great difficulty on command , as per PT/OT notes No indication for aspirin Patient being transferred to [facility] Neurology has signed off Recommend outpatient psychiatric evaluation  Left arm pain Outpatient EMG/NCS recommended  HIV: last CD4 in 05/2016 showing 1,160 and HIV quant of <20.  - continue tivicay, Complera  Depression/schizoaffective disorder: - continue wellbutrin, seroquel  HA: currently w/ HA which pt  states is her typical HA.  - continue topamax, Imitrex    Chronic pain: - continue oxycodone  Tobacco: trying to quit. Down to 2-3 cigarettes per day.  - nictone patch PRN      Discharge Exam:  Blood pressure 106/67, pulse 75, temperature 97.5 F (36.4 C), temperature source Oral, resp. rate 20, height '5\' 7"'  (1.702 m), weight 87.1 kg (192 lb), SpO2 97 %. General exam: Appears calm and comfortable  Respiratory system: Clear to auscultation. Respiratory effort normal. Cardiovascular system: S1 & S2 heard, RRR. No JVD, murmurs, rubs, gallops or clicks. No pedal edema. Gastrointestinal system: Abdomen is nondistended, soft and nontender. No organomegaly or masses felt. Normal bowel sounds heard. Central nervous system: Alert and oriented. No focal neurological deficits. Motor Strength - The patient's strength was normal in RUE and RLE, however, lack of effort in L UE and LLE with giveaway weakness   Follow-up Information    Dennie Bible, NP. Schedule an appointment as soon as possible for a visit in 6 week(s).   Specialty:  Family Medicine Contact information: [address] [place] [phone]           Signed: Reyne Dumas 12/31/2016, 9:50 AM        Time spent >1 hour

## 2016-12-31 NOTE — Progress Notes (Signed)
Discharge instructions reviewed with patient/family. All questions answered at this time. Equipments at bedside to be sent with pt. No other concerns voice. Transport home by family.   Sim BoastHavy, RN

## 2016-12-31 NOTE — Care Management Note (Addendum)
Case Management Note  Patient Details  Name: Tracy [name] MRN: [id number] Date of Birth: [birth year]  Subjective/Objective:                    Action/Plan: Patient discharging home with [facility] services. Pt is unable to receive the PT/OT d/t it not being a qualified Medicaid diagnosis. Pt is aware and MD updated. PT is going to provide the patient with exercise to work on at home with her family. CM met with the patient and her daughter and provided a list of [facility] agencies. She selected [facility]. [name] with [place] notified and accepted the referral.  CM consulted for PCP appointment. Pt wants to set up the own appointment that works with her daughters schedule. Pt with orders for walker and 3 in 1. Santiago Glad with [facility] DME notified and will deliver the equipment to the room.  Patient has transportation home.   Expected Discharge Date:  12/31/16               Expected Discharge Plan:  [place]  In-House Referral:     Discharge planning Services  CM Consult  Post Acute Care Choice:  Durable Medical Equipment, Home Health Choice offered to:  Patient, Adult Children  DME Arranged:  3-N-1, Walker rolling DME Agency:  [place] Arranged:  RN, Nurse's Aide, PT, OT (cant receive PT/OT under her Medicaid with current diagnosis) [place] Agency:  [place]  Status of Service:  Completed, signed off  If discussed at [place] of Stay Meetings, dates discussed:    Additional Comments:  Pollie Friar, RN 12/31/2016, 2:04 PM

## 2016-12-31 NOTE — Evaluation (Signed)
Speech Language Pathology Evaluation Patient Details Name: Tracy KaufmannYavohnda [name] MRN: [id number] DOB: October 16, 1965 Today's Date: 12/31/2016 Time: [id number] SLP Time Calculation (min) (ACUTE ONLY): 15 min  Problem List:  Patient Active Problem List   Diagnosis Date Noted  . Conversion disorder   . Cervical radiculopathy   . Stroke (cerebrum) (HCC) 12/29/2016  . Ischemic stroke (HCC) 12/29/2016  . Chronic headaches 12/29/2016  . Breast pain, left 05/25/2016  . Dry skin dermatitis 02/10/2016  . Itching due to drug 12/26/2015  . HIV disease (HCC) 10/03/2015  . Schizoaffective disorder (HCC) 10/03/2015  . Chronic pain syndrome 10/03/2015  . Cigarette smoker 10/03/2015   Past Medical History:  Past Medical History:  Diagnosis Date  . Chronic back pain   . Chronic headaches   . Depression   . HIV infection (HCC)   . Schizoaffective disorder [facility](HCC)    Past Surgical History:  Past Surgical History:  Procedure Laterality Date  . ABDOMINAL HYSTERECTOMY    . BREAST EXCISIONAL BIOPSY Right 1990's  . BREAST SURGERY     HPI:  Pt is a 51 yo female admitted to the ED for sudden onset of left upper and lower extremity weakness and numbness. Past medical history signicant for depression and HIV. 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    Assessment / Plan / Recommendation Clinical Impression   Pt presents with grossly intact cognitive-linguistic function. Pt's speech was fluent and free from dysarthria or word finding impairment.  Cognition appears [facility] for tasks assessed.  Pt's family was present for the duration of today's therapy session and reported pt to back to baseline.  As a result, no further ST needs are indicated at this time.  Pt and family are in agreement with recommended plan of care.      SLP Assessment  SLP Recommendation/Assessment: Patient does not need any further Speech Lanaguage Pathology Services    Follow Up Recommendations        Frequency and Duration           SLP Evaluation Cognition  Overall Cognitive Status: Within Functional Limits for tasks assessed       Comprehension  Auditory Comprehension Overall Auditory Comprehension: Impaired    Expression Verbal Expression Overall Verbal Expression: Appears within functional limits for tasks assessed   Oral / Motor  Oral Motor/Sensory Function Overall Oral Motor/Sensory Function: Within functional limits Motor Speech Overall Motor Speech: Appears within functional limits for tasks assessed   GO                    Breion Novacek, Melanee SpryNicole L 12/31/2016, 1:53 PM

## 2017-01-11 ENCOUNTER — Other Ambulatory Visit: Payer: Self-pay | Admitting: Internal Medicine

## 2017-02-05 ENCOUNTER — Encounter (HOSPITAL_COMMUNITY): Payer: Self-pay | Admitting: Emergency Medicine

## 2017-02-05 ENCOUNTER — Emergency Department (HOSPITAL_COMMUNITY)
Admission: EM | Admit: 2017-02-05 | Discharge: 2017-02-05 | Disposition: A | Discharge status: Home / Self Care | Payer: Medicaid Other | Attending: Emergency Medicine | Admitting: Emergency Medicine

## 2017-02-05 ENCOUNTER — Emergency Department (HOSPITAL_COMMUNITY): Payer: Medicaid Other

## 2017-02-05 DIAGNOSIS — F17210 Nicotine dependence, cigarettes, uncomplicated: Secondary | ICD-10-CM | POA: Insufficient documentation

## 2017-02-05 DIAGNOSIS — Z79899 Other long term (current) drug therapy: Secondary | ICD-10-CM | POA: Insufficient documentation

## 2017-02-05 DIAGNOSIS — M545 Low back pain, unspecified: Secondary | ICD-10-CM

## 2017-02-05 DIAGNOSIS — G8929 Other chronic pain: Secondary | ICD-10-CM

## 2017-02-05 MED ORDER — CARISOPRODOL 350 MG PO TABS: 350.0000 mg | ORAL_TABLET | Freq: Three times a day (TID) | ORAL | 0 refills | Status: DC

## 2017-02-05 MED ORDER — LIDO-CAPSAICIN-MEN-METHYL SAL 4-0.0375-5-20 % EX PTCH: 1.0000 | MEDICATED_PATCH | CUTANEOUS | 0 refills | Status: DC

## 2017-02-05 MED ORDER — OXYCODONE-ACETAMINOPHEN 5-325 MG PO TABS
2.0000 | ORAL_TABLET | Freq: Once | ORAL | Status: AC
  Administered 2017-02-05: 2 via ORAL
  Filled 2017-02-05: qty 2

## 2017-02-05 NOTE — ED Provider Notes (Signed)
[facility]-EMERGENCY DEPT Provider Note   CSN: [id number] Arrival date & time: 02/05/17  1036  By signing my name below, I, Deland PrettySherilynn Knight, attest that this documentation has been prepared under the direction and in the presence of Kallen Delatorre, PA-C. Electronically Signed: Deland PrettySherilynn Knight, ED Scribe. 02/05/17. 11:25 AM.  History   Chief Complaint Chief Complaint  Patient presents with  . Back Pain   The history is provided by the patient. No language interpreter was used.   HPI Comments: Tracy [name] is a 51 y.o. female, with a h/x of HIV and DDD, who presents to the Emergency Department complaining of gradually worsening "sharp" "throbbing" constant 10/10 bilateral lower back pain that began a week ago. She denies injury and recent fall.The pt states turning over from supine position to prone position and rising from supine position exacerbates her pain, while sitting up betters her pain. The pt states that she has a h/x chronic back pain, but that her current symptoms are far more severe and worsen with positional changes. The pt denies a h/x of IV drug use and CA. The pt states that her last CV4 count was "undetectable."  The pt has taken oxycodone 3x a day for 2 years with inadequate relief. The pt has a h/x of hysterectomy in April of 2017 and CVA (ischemia). She denies abdominal pain different from baseline, chills, fever, SOB, chest pain, diarrhea, nausea, vomiting, rash, dysuria, frequency, bowel incontinence, bladder incontinence, hematuria, numbness and weakness.   Past Medical History:  Diagnosis Date  . Chronic back pain   . Chronic headaches   . Depression   . HIV infection (HCC)   . Schizoaffective disorder [facility](HCC)     Patient Active Problem List   Diagnosis Date Noted  . Conversion disorder   . Cervical radiculopathy   . Stroke (cerebrum) (HCC) 12/29/2016  . Ischemic stroke (HCC) 12/29/2016  . Chronic headaches 12/29/2016  . Breast pain, left 05/25/2016  . Dry skin  dermatitis 02/10/2016  . Itching due to drug 12/26/2015  . HIV disease (HCC) 10/03/2015  . Schizoaffective disorder (HCC) 10/03/2015  . Chronic pain syndrome 10/03/2015  . Cigarette smoker 10/03/2015    Past Surgical History:  Procedure Laterality Date  . ABDOMINAL HYSTERECTOMY    . BREAST EXCISIONAL BIOPSY Right 1990's  . BREAST SURGERY      OB History    No data available       Home Medications    Prior to Admission medications   Medication Sig Start Date End Date Taking? Authorizing Provider  buPROPion (WELLBUTRIN XL) 300 MG 24 hr tablet Take 1 tablet (300 mg total) by mouth daily. 10/03/15  Yes Cliffton Astersampbell, John, MD  dolutegravir (TIVICAY) 50 MG tablet Take 1 tablet (50 mg total) by mouth daily. 01/20/16  Yes Cliffton Astersampbell, John, MD  emtricitabine-rilpivir-tenofovir DF (COMPLERA) 200-25-300 MG tablet Take 1 tablet by mouth daily. 12/26/15  Yes Cliffton Astersampbell, John, MD  ferrous sulfate 325 (65 FE) MG tablet Take 325 mg by mouth daily with breakfast.   Yes [provider]  gabapentin (NEURONTIN) 300 MG capsule Take 300 mg by mouth 3 (three) times daily.   Yes [provider]  loratadine (CLARITIN) 10 MG tablet Take 10 mg by mouth daily.   Yes [provider]  Multiple Vitamin (MULTIVITAMIN) tablet Take 1 tablet by mouth daily.   Yes [provider]  Oxycodone HCl 10 MG TABS Take 10 mg by mouth 3 (three) times daily as needed (pain).    Yes  [provider]  QUEtiapine (SEROQUEL XR) 400 MG 24 hr tablet Take 1 tablet (400 mg total) by mouth at bedtime. 10/03/15  Yes Cliffton Asters, MD  SUMAtriptan (IMITREX) 100 MG tablet Take 100 mg by mouth every 2 (two) hours as needed for migraine. May repeat in 2 hours if headache persists or recurs.   Yes [provider]  topiramate (TOPAMAX) 100 MG tablet Take 1 tablet (100 mg total) by mouth daily. 10/03/15  Yes Cliffton Asters, MD  carisoprodol (SOMA) 350 MG tablet Take 1 tablet (350 mg total) by mouth 3  (three) times daily. 02/05/17   Bandon Sherwin A, PA-C  Lido-Capsaicin-Men-Methyl Sal (1ST MEDX-PATCH/ LIDOCAINE) 4-0.373-11-29 % PTCH Apply 1 patch topically daily. 02/05/17   Sammye Staff A, PA-C    Family History Family History  Problem Relation Age of Onset  . Diabetes Mother   . Hypertension Mother   . Breast cancer Maternal Aunt     Social History Social History  Substance Use Topics  . Smoking status: Current Some Day Smoker    Packs/day: 0.30    Types: Cigarettes    Last attempt to quit: 04/26/2016  . Smokeless tobacco: Never Used     Comment: cutting back  . Alcohol use No     Comment: quit 6.2007     Allergies   Tetracyclines & related; Tramadol; Flexeril [cyclobenzaprine]; and Moxifloxacin   Review of Systems Review of Systems  Constitutional: Negative for activity change, chills and fever.  Respiratory: Negative for shortness of breath.   Cardiovascular: Negative for chest pain.  Gastrointestinal: Negative for abdominal pain, diarrhea, nausea and vomiting.       No bowel incontinence.  Genitourinary: Negative for dysuria, frequency and hematuria.  Musculoskeletal: Positive for back pain.  Skin: Negative for rash.  Neurological: Negative for weakness and numbness.   Physical Exam Updated Vital Signs BP 132/76 (BP Location: Right Arm)   Pulse 82   Temp 98.3 F (36.8 C)   Resp 18   SpO2 99%   Physical Exam  Constitutional: No distress.  HENT:  Head: Normocephalic.  Eyes: Conjunctivae are normal.  Neck: Neck supple.  Cardiovascular: Normal rate, regular rhythm, normal heart sounds and intact distal pulses.  Exam reveals no gallop and no friction rub.   No murmur heard. Pulmonary/Chest: Effort normal and breath sounds normal. No respiratory distress. She has no wheezes. She has no rales.  Abdominal: Soft. Bowel sounds are normal. She exhibits no distension. There is no tenderness. There is no rebound and no guarding.  Musculoskeletal: She exhibits no  edema or deformity.  TTP over the spinous process of L-spine and surrounding bilateral paraspinal muscles. No thoracic or cervical spinal processes tenderness or surrounding paraspinal muscles tenderness.  Patient is able to ambulate without difficulty. Bilateral hips and knees are non-tender with FROM. 2+ DP and PT pulses. 5/5 strength of the bilateral lower extremities. No sensory deficits. No crepitus or step-offs.   Neurological: She is alert.  Skin: Skin is warm. No rash noted.  Psychiatric: Her behavior is normal.  Nursing note and vitals reviewed.  ED Treatments / Results   DIAGNOSTIC STUDIES: Oxygen Saturation is 93% on RA, low by my interpretation.   COORDINATION OF CARE: 11:23 AM-Discussed next steps with pt. Pt verbalized understanding and is agreeable with the plan.   Labs (all labs ordered are listed, but only abnormal results are displayed) Labs Reviewed - No data to display  EKG  EKG Interpretation None  Radiology Dg Lumbar Spine Complete  Result Date: 02/05/2017 CLINICAL DATA:  Low back pain extending into both hips. Symptoms for 1 week. No acute injury. EXAM: LUMBAR SPINE - COMPLETE 4+ VIEW COMPARISON:  None. FINDINGS: Five lumbar type vertebral bodies. There is moderate disc space narrowing at L5-S1. The disc spaces are otherwise maintained. There is a grade 1 anterolisthesis at L4-5 and L5-S1 secondary to facet disease. No evidence acute fracture or pars defect. IMPRESSION: No acute findings. Degenerative disc disease at L5-S1 with degenerative anterolisthesis at the lower 2 levels. There is potential exiting L5 nerve root encroachment at L5-S1. Electronically Signed   By: Carey BullocksWilliam  Veazey M.D.   On: 02/05/2017 12:19    Procedures Procedures (including critical care time)  Medications Ordered in ED Medications  oxyCODONE-acetaminophen (PERCOCET/ROXICET) 5-325 MG per tablet 2 tablet (2 tablets Oral Given 02/05/17 1252)     Initial Impression /  Assessment and Plan / ED Course  I have reviewed the triage vital signs and the nursing notes.  Pertinent labs & imaging results that were available during my care of the patient were reviewed by me and considered in my medical decision making (see chart for details).     Patient with back pain, likely mechanical in nature.  No neurological deficits and normal neuro exam.  Patient is ambulatory.  No loss of bowel or bladder control.  No concern for cauda equina. No red flags. No fever, night sweats, weight loss, h/o cancer, IVDA, no recent procedure to back. No urinary symptoms suggestive of UTI.  Supportive care and return precaution discussed. Appears safe for discharge at this time. Follow up as indicated in discharge paperwork.   Final Clinical Impressions(s) / ED Diagnoses   Final diagnoses:  Acute exacerbation of chronic low back pain    New Prescriptions Discharge Medication List as of 02/05/2017 12:47 PM    START taking these medications   Details  carisoprodol (SOMA) 350 MG tablet Take 1 tablet (350 mg total) by mouth 3 (three) times daily., Starting Fri 02/05/2017, Print    Lido-Capsaicin-Men-Methyl Sal (1ST MEDX-PATCH/ LIDOCAINE) 4-0.373-11-29 % PTCH Apply 1 patch topically daily., Starting Fri 02/05/2017, Print       I personally performed the services described in this documentation, which was scribed in my presence. The recorded information has been reviewed and is accurate.     Barkley BoardsMcDonald, Ariely Riddell A, PA-C 02/05/17 2034    Nira Connardama, Pedro Eduardo, MD 02/06/17 [phone]

## 2017-02-05 NOTE — Discharge Instructions (Signed)
Please return to the emergency department if symptoms worsen or if he develops new symptoms, including fever, chills, weakness or numbness in the legs. If symptoms do not improve in the next week, please follow-up with your primary care provider.   You can take 1 tablet of Soma every 8 hours. You can also try applying heat for 15-20 minutes 3-4 times a day to help with pain or apply 1 lidocaine patch 2 areas that are sore every 24 hours. Please do not drive or go to work after taking Levi StraussSoma. You can take 1000 mg of Tylenol every 8 hours. Muscle strains can take some time to heal. If symptoms persist for more than one week, you can call the number to get established with a primary care provider for follow-up. Here are some stretches that may help the muscles in your lower back.   Stretches for Low Back Pain      Back Flexion Stretch. Lying on the back, pull both knees to the chest while simultaneously flexing the head forward until a comfortable stretch is felt across the mid and low back.      Knee to Chest Stretch. Lie on the back with the knees bent and both heels on the floor, then place both hands behind one knee and pull it toward the chest, stretching the gluteus and piriformis muscles in the buttock.  Kneeling Lunge Stretch. Starting on both knees, move one leg forward so the foot is flat on the ground, keeping weight evenly distributed through both hips (rather than on one side or the other). Place both hands on the top of the thigh, and gently lean the body forward to feel a stretch in the front of the other leg. This stretch affects the hip flexor muscles, which attach to the pelvis and can impact posture if too tight.    Piriformis Muscle Stretch. Lie on the back with knees bent and both heels on the floor. Cross one leg over the other, resting the ankle on the bent knee, then gently pull the bottom knee toward the chest until a stretch is felt in the buttock. Or, lying on the floor, cross one  leg over the other and pull it forward over the body at the knee, keeping the other leg flat.

## 2017-02-05 NOTE — ED Triage Notes (Signed)
Per patient, states she has a degenerative disc disease-states increased pain in her in lower back

## 2017-02-17 ENCOUNTER — Ambulatory Visit (INDEPENDENT_AMBULATORY_CARE_PROVIDER_SITE_OTHER): Payer: Medicaid Other | Admitting: Neurology

## 2017-02-17 VITALS — BP 129/87 | HR 87 | Ht 67.5 in | Wt 195.8 lb

## 2017-02-17 DIAGNOSIS — I6930 Unspecified sequelae of cerebral infarction: Secondary | ICD-10-CM

## 2017-02-17 DIAGNOSIS — R4701 Aphasia: Secondary | ICD-10-CM

## 2017-02-17 DIAGNOSIS — R29898 Other symptoms and signs involving the musculoskeletal system: Secondary | ICD-10-CM | POA: Diagnosis not present

## 2017-02-17 NOTE — Progress Notes (Signed)
[facility]    Provider:  Dr Lucia Gaskins Referring Provider: Marvel Plan, MD Primary Care Physician:  Shawn Stall, MD  CC:  Stroke follow up  HPI:  Tracy [name] is a 51 y.o. female here as a referral from Dr. Roda Shutters for stroke follow up. Past medical history of HIV, schizoaffective disorder who presented with left arm pain and weakness, slurred speech and aphasia [facility] on 12/29/2016. She was outside the window and left arm pain had been ongoing for 1 week. She was admitted and MRI of the brain was negative however it was a truncated exam patient pain. CT of the head showed no stroke, CT of the head possible left M2 and M3 occlusion however unclear, 2-D echo was unremarkable, LDL was 74. The stroke team felt that this may be functional because of lack of effort and giveaway weakness on the left.  Patient is here with her husband who also provides information. She stayed 2 days in the hospital for a code stroke. She had acute weakness was very sick in the previous week. She was not feeling well and they called the ambulance. It was slowly progressive left arm weakness throughout until she couldn't move it, speech was slurred. She had facial droop. She stayed for a couple and workup was completely negative. She continues to have paresthesias in her left arm. Her left knee started swelling but that is not related. Some days her left arm is completely normal. Some days it is tingly and heavy and she has to shake her hand out. Doesn't wake her up in the night. Nothing makes it better or worse.  Husband is here and provides information. Still having speech problems. But improved. The day she felt bad enough to call 911, they took a walk and it was very hot out.  Patient is still experiencing focal weakness and difficulty with speech. The MRI was normal however it was a truncated examination due to patient pain.   Reviewed notes, labs and imaging from outside physicians, which  showed:  Personally reviewed images and agree with the following: No acute intracranial process ; negative limited noncontrast MRI of the head (truncated examination due to patient pain).  Hemoglobin A1c 5.4  Reviewed echocardiogram report:  Study Conclusions  - Left ventricle: The cavity size was normal. Wall thickness was   increased in a pattern of mild LVH. Systolic function was normal.   The estimated ejection fraction was in the range of 60% to 65%.   Wall motion was normal; there were no regional wall motion   abnormalities. Left ventricular diastolic function parameters   were normal.  Reviewed report CT angiogram the neck and head:   1. Suspected acute arterial occlusion along the anterior division of the right MCA, but it is unclear whether this is a smaller M2 or a proximal M3 branch. 2. CT perfusion indicates a small volume of anterior division right MCA territory penumbra (16 mL) concordant with #1, but is negative for evidence of core infarct at this time. 3. Otherwise negative CTA head and neck arterial findings; no atherosclerosis or other arterial abnormality identified. 4. Stable CT appearance of the brain since 1016 hours today. 5. This study was reviewed in person with Dr. Ritta Slot between 1035 and 1045 hours.  Review of Systems: Patient complains of symptoms per HPI as well as the following symptoms: no CP, no SOB. Pertinent negatives and positives per HPI. All others negative.   Social History   Social  History  . Marital status: Married    Spouse name: N/A  . Number of children: N/A  . Years of education: N/A   Occupational History  . Not on file.   Social History Main Topics  . Smoking status: Current Some Day Smoker    Packs/day: 0.30    Types: Cigarettes    Last attempt to quit: 04/26/2016  . Smokeless tobacco: Never Used     Comment: cutting back  . Alcohol use No     Comment: quit 6.2007  . Drug use: No     Comment:  Previous drug use  . Sexual activity: Yes    Partners: Male     Comment: declined condoms    Other Topics Concern  . Not on file   Social History Narrative  . No narrative on file    Family History  Problem Relation Age of Onset  . Diabetes Mother   . Hypertension Mother   . Breast cancer Maternal Aunt     Past Medical History:  Diagnosis Date  . Chronic back pain   . Chronic headaches   . Depression   . HIV infection (HCC)   . Schizoaffective disorder [facility])     Past Surgical History:  Procedure Laterality Date  . ABDOMINAL HYSTERECTOMY    . BREAST EXCISIONAL BIOPSY Right 1990's  . BREAST SURGERY      Current Outpatient Prescriptions  Medication Sig Dispense Refill  . buPROPion (WELLBUTRIN XL) 300 MG 24 hr tablet Take 1 tablet (300 mg total) by mouth daily. 30 tablet 6  . carisoprodol (SOMA) 350 MG tablet Take 1 tablet (350 mg total) by mouth 3 (three) times daily. 15 tablet 0  . dolutegravir (TIVICAY) 50 MG tablet Take 1 tablet (50 mg total) by mouth daily. 30 tablet 11  . emtricitabine-rilpivir-tenofovir DF (COMPLERA) 200-25-300 MG tablet Take 1 tablet by mouth daily. 30 tablet 0  . ferrous sulfate 325 (65 FE) MG tablet Take 325 mg by mouth daily with breakfast.    . gabapentin (NEURONTIN) 300 MG capsule Take 300 mg by mouth 3 (three) times daily.    . Lido-Capsaicin-Men-Methyl Sal (1ST MEDX-PATCH/ LIDOCAINE) 4-0.373-11-29 % PTCH Apply 1 patch topically daily. 15 patch 0  . loratadine (CLARITIN) 10 MG tablet Take 10 mg by mouth daily.    . Multiple Vitamin (MULTIVITAMIN) tablet Take 1 tablet by mouth daily.    . Oxycodone HCl 10 MG TABS Take 10 mg by mouth 3 (three) times daily as needed (pain).     . QUEtiapine (SEROQUEL XR) 400 MG 24 hr tablet Take 1 tablet (400 mg total) by mouth at bedtime. 30 tablet 6  . SUMAtriptan (IMITREX) 100 MG tablet Take 100 mg by mouth every 2 (two) hours as needed for migraine. May repeat in 2 hours if headache persists or recurs.    .  topiramate (TOPAMAX) 100 MG tablet Take 1 tablet (100 mg total) by mouth daily. 30 tablet 6   No current facility-administered medications for this visit.     Allergies as of 02/17/2017 - Review Complete 02/05/2017  Allergen Reaction Noted  . Tetracyclines & related Itching 10/03/2015  . Tramadol Itching 10/03/2015  . Flexeril [cyclobenzaprine] Rash 10/03/2015  . Moxifloxacin Rash 10/03/2015    Vitals: There were no vitals taken for this visit. Last Weight:  Wt Readings from Last 1 Encounters:  12/29/16 192 lb (87.1 kg)   Last Height:   Ht Readings from Last 1 Encounters:  12/29/16 5\' 7"  (1.702  m)    Physical exam: Exam: Gen: NAD, conversant, poorly groomed CV: RRR, no MRG. No Carotid Bruits. No peripheral edema, warm, nontender Eyes: Conjunctivae clear without exudates or hemorrhage  Neuro: Detailed Neurologic Exam  Speech:    Speech is stuttering with normal comprehension.  Cognition:    The patient is oriented to person, place, and time;     recent and remote memory intact;     language stuttering, not aphasic;     Impaired attention, concentration, fund of knowledge  Cranial Nerves:    The pupils are equal, round, and reactive to light. Attempted funduscopic exam could not visualize. Visual fields are full to finger confrontation. Extraocular movements are intact. Trigeminal sensation is intact and the muscles of mastication are normal. The face is symmetric. The palate elevates in the midline. Hearing intact. Voice is normal. Shoulder shrug is normal. The tongue has normal motion without fasciculations.   Coordination:    Normal finger to nose and heel to shin. Normal rapid alternating movements.   Gait:    Heel-toe and tandem gait are normal.   Motor Observation:    No asymmetry, no atrophy, and no involuntary movements noted. Tone:    Normal muscle tone.    Posture:    Posture is normal. normal erect    Strength:    Strength is V/V in the upper and  lower limbs.      Sensation: intact to LT     Reflex Exam:  DTR's:    Deep tendon reflexes in the upper and lower extremities are normal bilaterally.   Toes:    The toes are downgoing bilaterally.   Clonus:    Clonus is absent.      Assessment/Plan:  This is a 51 year old female presented to the  in June for 1 week of left arm weakness, paresthesias and speech difficulties. CT of the head was negative. MRI of the brain was also negative however it was a truncated exam due to patient pain. CTA of the head and neck showed a suspected acute arterial occlusion along the anterior division of the right MCA. The stroke team felt this may be functional as she showed very poor effort and give way weakness on the left. Today she returns with a husband with normal strength however her speech is still stuttering and she is having difficulty with words.  MRI of the brain was truncated and given the CTA findings of suspected acute arterial occlusion, persistent difficulty with speech, feel a repeat MRI of the brain is warranted. We will order an EMG nerve conduction study of the left arm for any other reasons for her left arm paresthesias and weakness. Patient was advised to present to the emergency room immediately for focal and acute onset neurologic symptoms If a stroke is suspected, patient needs to start aspirin 325 mg and stroke needs to be evaluated for causes such as atrial fibrillation and then further workup if needed.  Tracy DeanAntonia Yarely Bebee, MD  [facility] [address] [place], [place] [id number]  Phone [phone] Fax [phone]

## 2017-02-18 ENCOUNTER — Telehealth: Payer: Self-pay | Admitting: Neurology

## 2017-02-18 ENCOUNTER — Encounter: Payer: Self-pay | Admitting: Neurology

## 2017-02-18 NOTE — Telephone Encounter (Signed)
Tracy DikeJennifer, Would you mind please calling patient and letting her know that since our system came back up I was able to review the MRI of her brain which turns out was a limited study; they could not completed due to her pain. I do think given her persistent speech difficulties a repeat MRI of the brain is warranted. I'm going to order it and they will call her to schedule.

## 2017-02-22 NOTE — Telephone Encounter (Signed)
Pt scheduled to repeat MRI 03/03/17.

## 2017-02-24 ENCOUNTER — Encounter: Payer: Self-pay | Admitting: Neurology

## 2017-02-24 NOTE — Addendum Note (Signed)
Addended by: Donnelly AngelicaHOGAN, Marylan Glore L on: 02/24/2017 02:38 PM   Modules accepted: Orders

## 2017-03-02 ENCOUNTER — Telehealth: Payer: Self-pay | Admitting: Neurology

## 2017-03-02 NOTE — Telephone Encounter (Signed)
Will need to post pone MRI by 1 week, until Dr Lucia Gaskins is back.

## 2017-03-02 NOTE — Telephone Encounter (Signed)
This is a Dr. Lucia Gaskins patient. Victorino Dike with [facility] informed me that medicaid didn't approve the MRI. The phone number for a peer to peer is [phone] and the case number is [id number]. She is scheduled to have the MRI Wednesday 03/03/17 at 6:00 pm.

## 2017-03-03 ENCOUNTER — Inpatient Hospital Stay: Admission: RE | Admit: 2017-03-03 | Payer: Self-pay | Source: Ambulatory Visit

## 2017-03-03 NOTE — Telephone Encounter (Signed)
I called [facility] on Tuesday 03/02/17 and spoke to [employer]. And informed her that this would have to be post pone until after Dr. Lucia Gaskins gets back. Victorino Dike stated she will contact the patient and inform her that it will have to be reschedule.Victorino Dike tried to leave her a voicemail but was unable to because her voicemail was full. She sent her an e-mail as well to inform her about this.

## 2017-03-09 NOTE — Telephone Encounter (Signed)
Tracy [name], can we ask for a reconsideration?

## 2017-03-09 NOTE — Telephone Encounter (Signed)
I spoke to the [facility] nurse and she stated it is not at the reconsideration process yet because the case is still open. I did give the nurse more clinical information and I am also going to fax the clinical notes. I will let you know if it still requires a peer to peer once all of this has been reviewed from the information that I gave her.

## 2017-03-23 ENCOUNTER — Ambulatory Visit
Admission: RE | Admit: 2017-03-23 | Discharge: 2017-03-23 | Disposition: A | Discharge status: Home / Self Care | Payer: Medicaid Other | Source: Ambulatory Visit | Attending: Neurology | Admitting: Neurology

## 2017-03-23 DIAGNOSIS — R4701 Aphasia: Secondary | ICD-10-CM | POA: Diagnosis not present

## 2017-03-23 DIAGNOSIS — R29898 Other symptoms and signs involving the musculoskeletal system: Secondary | ICD-10-CM

## 2017-03-23 DIAGNOSIS — I6930 Unspecified sequelae of cerebral infarction: Secondary | ICD-10-CM

## 2017-03-25 ENCOUNTER — Telehealth: Payer: Self-pay | Admitting: *Deleted

## 2017-03-25 NOTE — Telephone Encounter (Signed)
LMVM for pt (DPR) ok.  MRI brain results normal study.  Call back if questions.

## 2017-03-25 NOTE — Telephone Encounter (Signed)
-----   Message from Anson FretAntonia B Ahern, MD sent at 03/25/2017 11:26 AM EDT ----- MRI brain normal thanks

## 2017-04-07 ENCOUNTER — Other Ambulatory Visit: Payer: Medicaid Other

## 2017-04-07 DIAGNOSIS — Z113 Encounter for screening for infections with a predominantly sexual mode of transmission: Secondary | ICD-10-CM

## 2017-04-07 DIAGNOSIS — B20 Human immunodeficiency virus [HIV] disease: Secondary | ICD-10-CM

## 2017-04-07 DIAGNOSIS — Z79899 Other long term (current) drug therapy: Secondary | ICD-10-CM

## 2017-04-08 LAB — COMPLETE METABOLIC PANEL WITH GFR
AG Ratio: 1.7 (calc) (ref 1.0–2.5)
ALBUMIN MSPROF: 4.1 g/dL (ref 3.6–5.1)
ALKALINE PHOSPHATASE (APISO): 61 U/L (ref 33–115)
ALT: 37 U/L — ABNORMAL HIGH (ref 6–29)
AST: 31 U/L (ref 10–35)
BUN / CREAT RATIO: 11 (calc) (ref 6–22)
BUN: 12 mg/dL (ref 7–25)
CALCIUM: 9 mg/dL (ref 8.6–10.2)
CO2: 23 mmol/L (ref 20–32)
CREATININE: 1.12 mg/dL — AB (ref 0.50–1.10)
Chloride: 107 mmol/L (ref 98–110)
GFR, EST AFRICAN AMERICAN: 67 mL/min/{1.73_m2} (ref >=60)
GFR, EST NON AFRICAN AMERICAN: 58 mL/min/{1.73_m2} — AB (ref >=60)
GLOBULIN: 2.4 g/dL (ref 1.9–3.7)
Glucose, Bld: 82 mg/dL (ref 65–99)
Potassium: 4.2 mmol/L (ref 3.5–5.3)
SODIUM: 140 mmol/L (ref 135–146)
TOTAL PROTEIN: 6.5 g/dL (ref 6.1–8.1)
Total Bilirubin: 0.6 mg/dL (ref 0.2–1.2)

## 2017-04-08 LAB — LIPID PANEL
CHOLESTEROL: 184 mg/dL (ref <200)
HDL: 82 mg/dL (ref >50)
LDL CHOLESTEROL (CALC): 87 mg/dL
Non-HDL Cholesterol (Calc): 102 mg/dL (calc) (ref <130)
Total CHOL/HDL Ratio: 2.2 (calc) (ref <5.0)
Triglycerides: 64 mg/dL (ref <150)

## 2017-04-08 LAB — CBC WITH DIFFERENTIAL/PLATELET
BASOS ABS: 20 {cells}/uL (ref 0–200)
Basophils Relative: 0.4 %
EOS ABS: 39 {cells}/uL (ref 15–500)
Eosinophils Relative: 0.8 %
HEMATOCRIT: 39.2 % (ref 35.0–45.0)
Hemoglobin: 13.4 g/dL (ref 11.7–15.5)
LYMPHS ABS: 1965 {cells}/uL (ref 850–3900)
MCH: 30.7 pg (ref 27.0–33.0)
MCHC: 34.2 g/dL (ref 32.0–36.0)
MCV: 89.7 fL (ref 80.0–100.0)
MPV: 10.1 fL (ref 7.5–12.5)
Monocytes Relative: 13.8 %
NEUTROS PCT: 44.9 %
Neutro Abs: 2200 cells/uL (ref 1500–7800)
PLATELETS: 256 10*3/uL (ref 140–400)
RBC: 4.37 10*6/uL (ref 3.80–5.10)
RDW: 12.3 % (ref 11.0–15.0)
TOTAL LYMPHOCYTE: 40.1 %
WBC: 4.9 10*3/uL (ref 3.8–10.8)
WBCMIX: 676 {cells}/uL (ref 200–950)

## 2017-04-08 LAB — RPR: RPR: NONREACTIVE

## 2017-04-08 LAB — T-HELPER CELL (CD4) - (RCID CLINIC ONLY)
CD4 T CELL ABS: 1010 /uL (ref 400–2700)
CD4 T CELL HELPER: 45 % (ref 33–55)

## 2017-04-09 LAB — HIV-1 RNA QUANT-NO REFLEX-BLD
HIV 1 RNA QUANT: 1910 {copies}/mL — AB
HIV-1 RNA QUANT, LOG: 3.28 {Log_copies}/mL — AB

## 2017-04-21 ENCOUNTER — Ambulatory Visit: Payer: Self-pay | Admitting: Internal Medicine

## 2017-04-27 ENCOUNTER — Ambulatory Visit (INDEPENDENT_AMBULATORY_CARE_PROVIDER_SITE_OTHER): Payer: Medicaid Other | Admitting: Internal Medicine

## 2017-04-27 ENCOUNTER — Encounter: Payer: Self-pay | Admitting: Internal Medicine

## 2017-04-27 ENCOUNTER — Ambulatory Visit (INDEPENDENT_AMBULATORY_CARE_PROVIDER_SITE_OTHER): Payer: Self-pay | Admitting: Licensed Clinical Social Worker

## 2017-04-27 VITALS — BP 121/84 | HR 112 | Temp 98.4°F | Wt 205.0 lb

## 2017-04-27 DIAGNOSIS — F449 Dissociative and conversion disorder, unspecified: Secondary | ICD-10-CM | POA: Diagnosis not present

## 2017-04-27 DIAGNOSIS — G894 Chronic pain syndrome: Secondary | ICD-10-CM | POA: Diagnosis not present

## 2017-04-27 DIAGNOSIS — F3341 Major depressive disorder, recurrent, in partial remission: Secondary | ICD-10-CM

## 2017-04-27 DIAGNOSIS — B20 Human immunodeficiency virus [HIV] disease: Secondary | ICD-10-CM

## 2017-04-27 DIAGNOSIS — Z23 Encounter for immunization: Secondary | ICD-10-CM

## 2017-04-27 MED ORDER — MENINGOCOCCAL A C Y&W-135 OLIG IM SOLR
0.5000 mL | Freq: Once | INTRAMUSCULAR | Status: AC
  Administered 2017-04-27: 0.5 mL via INTRAMUSCULAR

## 2017-04-27 NOTE — Assessment & Plan Note (Signed)
I am not sure if her left arm pain and subjective weakness were due to a conversion disorder but I am certainly concerned that her depression has relapsed. I had her meet with our counselor, Vergia Alberts, today. I encouraged her to restart her Seroquel and Wellbutrin. I will see her back in 2 weeks.

## 2017-04-27 NOTE — Assessment & Plan Note (Signed)
She has chronic pain so it is difficult to know what is causing her left arm pain. She tells me that she is scheduled to follow-up with her primary care provider soon.

## 2017-04-27 NOTE — Progress Notes (Signed)
Patient Active Problem List   Diagnosis Date Noted  . Conversion disorder     Priority: High  . HIV disease ([place]) 10/03/2015    Priority: High  . Cervical radiculopathy   . Chronic headaches 12/29/2016  . Breast pain, left 05/25/2016  . Dry skin dermatitis 02/10/2016  . Itching due to drug 12/26/2015  . Schizoaffective disorder ([place]) 10/03/2015  . Chronic pain syndrome 10/03/2015  . Cigarette smoker 10/03/2015    Patient's Medications  New Prescriptions   No medications on file  Previous Medications   BUPROPION (WELLBUTRIN XL) 300 MG 24 HR TABLET    Take 1 tablet (300 mg total) by mouth daily.   CARISOPRODOL (SOMA) 350 MG TABLET    Take 1 tablet (350 mg total) by mouth 3 (three) times daily.   DOLUTEGRAVIR (TIVICAY) 50 MG TABLET    Take 1 tablet (50 mg total) by mouth daily.   EMTRICITABINE-RILPIVIR-TENOFOVIR DF (COMPLERA) 200-25-300 MG TABLET    Take 1 tablet by mouth daily.   FERROUS SULFATE 325 (65 FE) MG TABLET    Take 325 mg by mouth daily with breakfast.   GABAPENTIN (NEURONTIN) 300 MG CAPSULE    Take 300 mg by mouth 3 (three) times daily.   LIDO-CAPSAICIN-MEN-METHYL SAL (1ST MEDX-PATCH/ LIDOCAINE) 4-0.373-11-29 % PTCH    Apply 1 patch topically daily.   LORATADINE (CLARITIN) 10 MG TABLET    Take 10 mg by mouth daily.   OXYCODONE HCL 10 MG TABS    Take 10 mg by mouth 3 (three) times daily as needed (pain).    QUETIAPINE (SEROQUEL XR) 400 MG 24 HR TABLET    Take 1 tablet (400 mg total) by mouth at bedtime.   SUMATRIPTAN (IMITREX) 100 MG TABLET    Take 100 mg by mouth every 2 (two) hours as needed for migraine. May repeat in 2 hours if headache persists or recurs.   TOPIRAMATE (TOPAMAX) 100 MG TABLET    Take 1 tablet (100 mg total) by mouth daily.  Modified Medications   No medications on file  Discontinued Medications   No medications on file    Subjective: Tracy [name] is in for her routine HIV follow-up visit. She is accompanied by her husband, Tracy [name].  Initially she told me that she was taking her HIV medication. She did not know the names of her medications but she was finally able to recognize them on the chart. She then told me that she had been off of her medication for the last 3 weeks. She says that in general she has only taken them about 1-2 times each week over the past 6 months. She says that she is frustrated and tired. It seems like the only medication she is currently taking is her gabapentin. She is not on her Seroquel or Wellbutrin. She tells me that her primary care provider, Dr. Briscoe Deutscher, referred her to a psychiatrist but she never went to that appointment.  She was hospitalized in June with left arm pain and weakness. There was initial concern for a right middle cerebral artery CVA. However she was evaluated by neurology and was noted to not putting effort into her neurologic exam. They concluded that she was having a conversion disorder and not a stroke. She had a follow-up brain scan as an outpatient on 03/25/2017 which was normal. She says she is still bothered by left upper arm pain and wants to know what is causing it.  Review of  Systems: Review of Systems  Constitutional: Positive for malaise/fatigue. Negative for chills, diaphoresis, fever and weight loss.  HENT: Negative for sore throat.   Respiratory: Negative for cough, sputum production and shortness of breath.   Cardiovascular: Negative for chest pain.  Gastrointestinal: Negative for abdominal pain, diarrhea, heartburn, nausea and vomiting.  Genitourinary: Negative for dysuria and frequency.  Musculoskeletal: Positive for joint pain. Negative for myalgias.  Skin: Negative for rash.  Neurological: Negative for dizziness and headaches.  Psychiatric/Behavioral: Positive for depression. Negative for substance abuse. The patient is not nervous/anxious.     Past Medical History:  Diagnosis Date  . Chronic back pain   . Chronic headaches   . Depression   . HIV  infection ([place])   . Schizoaffective disorder ([place])   . Stroke (cerebrum) [facility])     Social History  Substance Use Topics  . Smoking status: Current Some Day Smoker    Packs/day: 0.30    Types: Cigarettes    Last attempt to quit: 04/26/2016  . Smokeless tobacco: Never Used     Comment: cutting back 1-2 per wk  . Alcohol use No     Comment: quit 12/2005    Family History  Problem Relation Age of Onset  . Diabetes Mother   . Hypertension Mother   . Tremor Mother   . Breast cancer Maternal Aunt   . High blood pressure Sister   . High blood pressure Maternal Uncle   . Diabetes Maternal Uncle     Allergies  Allergen Reactions  . Tetracyclines & Related Itching  . Tramadol Itching  . Flexeril [Cyclobenzaprine] Rash  . Moxifloxacin Rash    Health Maintenance  Topic Date Due  . TETANUS/TDAP  07/07/1986  . PAP SMEAR  07/07/1988  . INFLUENZA VACCINE  02/10/2017  . HIV Screening  Completed    Objective:  Vitals:   04/27/17 0951  BP: 121/84  Pulse: (!) 112  Temp: 98.4 F (36.9 C)  TempSrc: Oral  Weight: 205 lb (93 kg)   Body mass index is 31.63 kg/m.  Physical Exam  Constitutional: She is oriented to person, place, and time.  Her weight is up 13 pounds since June.  HENT:  Mouth/Throat: No oropharyngeal exudate.  Eyes: Conjunctivae are normal.  Cardiovascular: Normal rate and regular rhythm.   No murmur heard. Pulmonary/Chest: Effort normal and breath sounds normal.  Abdominal: Soft. She exhibits no mass. There is no tenderness.  Musculoskeletal: Normal range of motion.  Neurological: She is alert and oriented to person, place, and time.  Mild pain with palpation and range of motion of her left shoulder and upper arm. There is no redness, swelling or warmth.  Skin: No rash noted.  Psychiatric:  Flat affect.    Lab Results Lab Results  Component Value Date   WBC 4.9 04/07/2017   HGB 13.4 04/07/2017   HCT 39.2 04/07/2017   MCV 89.7 04/07/2017   PLT 256  04/07/2017    Lab Results  Component Value Date   CREATININE 1.12 (H) 04/07/2017   BUN 12 04/07/2017   NA 140 04/07/2017   K 4.2 04/07/2017   CL 107 04/07/2017   CO2 23 04/07/2017    Lab Results  Component Value Date   ALT 37 (H) 04/07/2017   AST 31 04/07/2017   ALKPHOS 58 12/31/2016   BILITOT 0.6 04/07/2017    Lab Results  Component Value Date   CHOL 184 04/07/2017   HDL 82 04/07/2017   LDLCALC 74 12/30/2016  TRIG 64 04/07/2017   CHOLHDL 2.2 04/07/2017   Lab Results  Component Value Date   LABRPR NON-REACTIVE 04/07/2017   HIV 1 RNA Quant (copies/mL)  Date Value  04/07/2017 1,910 (H)  05/25/2016 <20  12/26/2015 37 (H)   CD4 T Cell Abs (/uL)  Date Value  04/07/2017 1,010  05/25/2016 1,160  12/26/2015 810     Problem List Items Addressed This Visit      High   Conversion disorder    I am not sure if her left arm pain and subjective weakness were due to a conversion disorder but I am certainly concerned that her depression has relapsed. I had her meet with our counselor, Sande Rives, today. I encouraged her to restart her Seroquel and Wellbutrin. I will see her back in 2 weeks.      HIV disease ([place])    Her infection has gone out of control because of poor adherence. I told her that taking her medication 1 or 2 times each week was extremely dangerous and very likely to promote the development of resistance. I told her to stop her Complera and Tivicay completely for now. I will check resistance assays today and see her back in 2 weeks.      Relevant Medications   meningococcal oligosaccharide (MENVEO) injection 0.5 mL (Completed) (Start on 04/27/2017 11:30 AM)   Other Relevant Orders   HIV-1 genotypr plus   HIV-1 Integrase Genotype   HLA [id number]     Unprioritized   Chronic pain syndrome    She has chronic pain so it is difficult to know what is causing her left arm pain. She tells me that she is scheduled to follow-up with her primary care provider  soon.       Other Visit Diagnoses    Need for meningococcal vaccination    -  Primary   Relevant Medications   meningococcal oligosaccharide (MENVEO) injection 0.5 mL (Completed) (Start on 04/27/2017 11:30 AM)   Need for immunization against influenza       Relevant Orders   Flu Vaccine QUAD 36+ mos IM (Completed)        Michel Bickers, MD [facility] for [place] [phone] pager   [phone] cell 04/27/2017, 11:27 AM

## 2017-04-27 NOTE — Assessment & Plan Note (Signed)
Her infection has gone out of control because of poor adherence. I told her that taking her medication 1 or 2 times each week was extremely dangerous and very likely to promote the development of resistance. I told her to stop her Complera and Tivicay completely for now. I will check resistance assays today and see her back in 2 weeks.

## 2017-05-03 LAB — HLA B*5701: HLA-B*5701 w/rflx HLA-B High: NEGATIVE

## 2017-05-03 NOTE — BH Specialist Note (Signed)
Integrated Behavioral Health Initial Visit  MRN: [id number] Name: Tracy [name]  Number of Integrated Behavioral Health Clinician visits:: 1/6 Session Start time: 10:44 am  Session End time: 11:11 am Total time: 25 mins  Type of Service: Integrated Behavioral Health- Individual/Family Interpretor:No. Interpretor Name and Language: N/A   Warm Hand Off Completed.       SUBJECTIVE: Tracy [name] is a 51 y.o. female accompanied by Spouse Patient was referred by Dr. Orvan Falconerampbell for medication noncompliance.  Patient reports the following symptoms/concerns: Patient reported that she has been feeling "tired of taking" HIV medications and that she wanted a break from taking medications.  Patient reported that she wanted to see what would happen if she stopped the medication, and currently is not sure she is ready to start them again.  Patient was able to process the result of stopping her medication as increased viral load and was insightful of the consequence.  Patient denied wanting her viral load to increase further or get secondary infections and was motivated by this to avoid further medical complications.  Additionally patient was insightful that by not sharing the noncompliant information with her husband that she was placing roadblocks to trust and communication in her marriage.  Patient was able to communicate her medication noncompliance to her husband in the session and develop and verbalize a compliance plan to her husband.  [facility] and inform her husband if noncompliant thoughts return.  [facility] also encouraged the patient and spouse to take their medications together at the same time daily to ensure compliance and both were receptive.  Patient also reported noncompliance with psychotropic medications and reported that she stops taking them when she begins to "feel better".  Patient reported the following about her mental health:  current mood is "happy" and feels fulfilled with life; she is consistently engaging in fun activities with family like walking with her husband, playing games, and teaching her granddaughter; denied hopelessness; reported increase in appetite; sleep is good at 7-9 hours a night; denied fatigue; and denied suicidal ideations, plan and intent.  Patient stated that she does not feel depressed because she has been depressed before and knows what it feels like.  Patient was able to list problematic symptoms that are tell-tale signs of increasing depression as constantly crying, which she denied has happened yet.  Patient reported that she knows when and where to seek help for her depression, due to past voluntary psy inpatient admissions, but was receptive to not waiting until symptoms worsen to seek treatment.  [facility]  Severity of problem: mild  OBJECTIVE: Mood: Euthymic and Affect: Appropriate Risk of harm to self or others: No plan to harm self or others  Thought process: coherent Thought content: logical  LIFE CONTEXT: Family and Social: Patient is living with her husband and has two daughters.  One of her daughters is pregnant and she helps care for her granddaughter. Self-Care: Patient is able to tend to her ADL's, however has been HIV medication noncompliant. Life Changes: Patient has stopped taking her HIV medications and has kept this information from her spouse.  ASSESSMENT: Patient is currently experiencing medication noncompliance and may benefit from medical education and behavioral health services.  GOALS ADDRESSED: Patient will: 1. Reduce symptoms of: noncompliance 2. Increase knowledge and/or ability of: coping skills, healthy habits and self-management skills  3. Demonstrate ability to: Increase healthy adjustment to current life circumstances,  Increase motivation to adhere to plan of care and Improve medication  compliance  INTERVENTIONS: Interventions utilized: Solution-Focused Strategies and Supportive Counseling   PLAN: 1. Follow up with behavioral health clinician on : Patient was not willing to schedule appointment with [facility]. 2. Behavioral recommendations: Utilize compliance plan mentioned above.  Communicate medication difficulties and side effects.   Vergia Alberts, [facility]

## 2017-05-12 ENCOUNTER — Ambulatory Visit (INDEPENDENT_AMBULATORY_CARE_PROVIDER_SITE_OTHER): Payer: Medicaid Other | Admitting: Internal Medicine

## 2017-05-12 ENCOUNTER — Other Ambulatory Visit: Payer: Self-pay | Admitting: Pharmacist

## 2017-05-12 ENCOUNTER — Encounter: Payer: Self-pay | Admitting: Internal Medicine

## 2017-05-12 VITALS — BP 135/86 | HR 99 | Temp 98.5°F | Wt 211.0 lb

## 2017-05-12 DIAGNOSIS — F17210 Nicotine dependence, cigarettes, uncomplicated: Secondary | ICD-10-CM

## 2017-05-12 DIAGNOSIS — F250 Schizoaffective disorder, bipolar type: Secondary | ICD-10-CM

## 2017-05-12 DIAGNOSIS — B20 Human immunodeficiency virus [HIV] disease: Secondary | ICD-10-CM

## 2017-05-12 MED ORDER — EMTRICITAB-RILPIVIR-TENOFOV DF 200-25-300 MG PO TABS: 1.0000 | ORAL_TABLET | Freq: Every day | ORAL | 0 refills | Status: DC

## 2017-05-12 MED ORDER — DOLUTEGRAVIR SODIUM 50 MG PO TABS: 50.0000 mg | ORAL_TABLET | Freq: Every day | ORAL | 0 refills | Status: DC

## 2017-05-12 NOTE — Assessment & Plan Note (Signed)
She continues to smoke cigarettes and does not feel that she is able to quit at this time.

## 2017-05-12 NOTE — Assessment & Plan Note (Signed)
I reviewed her recent lab work with her and showed her how her viral load reactivated shortly after stopping Complera and Tivicay. I showed her how her lab work was much better last year when she was consistently taking her medication. She is not having any problems tolerating it. She has no evidence of intregrase resistance mutations. She is using her pillbox for all of her medications. She is eager to restart therapy. She will follow-up here after repeat lab work in 6 weeks.

## 2017-05-12 NOTE — Assessment & Plan Note (Signed)
She may have had mild recent depression but it seems to be in remission now. When she quit taking her antiretroviral make dictation she did continue taking Wellbutrin, Seroquel and Neurontin. [place] [facility] [facility]. She knows that she can call at any time to set up an appointment.

## 2017-05-12 NOTE — Progress Notes (Signed)
Patient Active Problem List   Diagnosis Date Noted  . Conversion disorder     Priority: High  . HIV disease (HCC) 10/03/2015    Priority: High  . Cervical radiculopathy   . Chronic headaches 12/29/2016  . Breast pain, left 05/25/2016  . Dry skin dermatitis 02/10/2016  . Itching due to drug 12/26/2015  . Schizoaffective disorder (HCC) 10/03/2015  . Chronic pain syndrome 10/03/2015  . Cigarette smoker 10/03/2015    Patient's Medications  New Prescriptions   No medications on file  Previous Medications   BUPROPION (WELLBUTRIN XL) 300 MG 24 HR TABLET    Take 1 tablet (300 mg total) by mouth daily.   CARISOPRODOL (SOMA) 350 MG TABLET    Take 1 tablet (350 mg total) by mouth 3 (three) times daily.   FERROUS SULFATE 325 (65 FE) MG TABLET    Take 325 mg by mouth daily with breakfast.   GABAPENTIN (NEURONTIN) 300 MG CAPSULE    Take 300 mg by mouth 3 (three) times daily.   LIDO-CAPSAICIN-MEN-METHYL SAL (1ST MEDX-PATCH/ LIDOCAINE) 4-0.373-11-29 % PTCH    Apply 1 patch topically daily.   LORATADINE (CLARITIN) 10 MG TABLET    Take 10 mg by mouth daily.   OXYCODONE HCL 10 MG TABS    Take 10 mg by mouth 3 (three) times daily as needed (pain).    QUETIAPINE (SEROQUEL XR) 400 MG 24 HR TABLET    Take 1 tablet (400 mg total) by mouth at bedtime.   SUMATRIPTAN (IMITREX) 100 MG TABLET    Take 100 mg by mouth every 2 (two) hours as needed for migraine. May repeat in 2 hours if headache persists or recurs.   TOPIRAMATE (TOPAMAX) 100 MG TABLET    Take 1 tablet (100 mg total) by mouth daily.  Modified Medications   Modified Medication Previous Medication   DOLUTEGRAVIR (TIVICAY) 50 MG TABLET dolutegravir (TIVICAY) 50 MG tablet      Take 1 tablet (50 mg total) by mouth daily.    Take 1 tablet (50 mg total) by mouth daily.   EMTRICITABINE-RILPIVIR-TENOFOVIR DF (COMPLERA) 200-25-300 MG TABLET emtricitabine-rilpivir-tenofovir DF (COMPLERA) 200-25-300 MG tablet      Take 1 tablet by mouth daily.     Take 1 tablet by mouth daily.  Discontinued Medications   No medications on file     Subjective: Tracy [name] is in for her routine HIV follow-up visit. She now tells me that it's been about 2 months and she has taken her Complera and Tivicay. She says that after 10 years of taking medication every day she got tired and wanted to see what would happen if she stopped. She says that she was not having any problems obtaining her medications. Her pharmacy mails it to her on time each month. She was not having any problems tolerating her medications. She now says that since stopping the medication she had noted feeling much weaker. She has also been struggling to get over a cold. She is eager to start back on her medications. She denies feeling depressed.  Review of Systems: Review of Systems  Constitutional: Positive for malaise/fatigue. Negative for chills, diaphoresis, fever and weight loss.  HENT: Positive for congestion. Negative for sore throat.   Respiratory: Negative for cough, sputum production and shortness of breath.   Cardiovascular: Negative for chest pain.  Gastrointestinal: Negative for abdominal pain, diarrhea, heartburn, nausea and vomiting.  Genitourinary: Negative for dysuria and frequency.  Musculoskeletal: Negative  for joint pain and myalgias.  Skin: Negative for rash.  Neurological: Positive for weakness. Negative for dizziness and headaches.  Psychiatric/Behavioral: Negative for depression and substance abuse. The patient is not nervous/anxious.     Past Medical History:  Diagnosis Date  . Chronic back pain   . Chronic headaches   . Depression   . HIV infection (HCC)   . Schizoaffective disorder (HCC)   . Stroke (cerebrum) [facility])     Social History  Substance Use Topics  . Smoking status: Current Some Day Smoker    Packs/day: 0.30    Types: Cigarettes    Last attempt to quit: 04/26/2016  . Smokeless tobacco: Never Used     Comment: cutting back 1-2 per wk  .  Alcohol use No     Comment: quit 12/2005    Family History  Problem Relation Age of Onset  . Diabetes Mother   . Hypertension Mother   . Tremor Mother   . Breast cancer Maternal Aunt   . High blood pressure Sister   . High blood pressure Maternal Uncle   . Diabetes Maternal Uncle     Allergies  Allergen Reactions  . Tetracyclines & Related Itching  . Tramadol Itching  . Flexeril [Cyclobenzaprine] Rash  . Moxifloxacin Rash    Health Maintenance  Topic Date Due  . PAP SMEAR  07/07/1988  . TETANUS/TDAP  11/08/2023  . INFLUENZA VACCINE  Completed  . HIV Screening  Completed    Objective:  Vitals:   05/12/17 1444  BP: 135/86  Pulse: 99  Temp: 98.5 F (36.9 C)  TempSrc: Oral  Weight: 211 lb (95.7 kg)   Body mass index is 32.56 kg/m.  Physical Exam  Constitutional: She is oriented to person, place, and time.  She is in better spirits and smiling today. 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  HENT:  Mouth/Throat: No oropharyngeal exudate.  Eyes: Conjunctivae are normal.  Cardiovascular: Normal rate and regular rhythm.   No murmur heard. Pulmonary/Chest: Effort normal and breath sounds normal.  Abdominal: Soft. She exhibits no mass. There is no tenderness.  Musculoskeletal: Normal range of motion.  Neurological: She is alert and oriented to person, place, and time.  Skin: No rash noted.  Psychiatric: Mood and affect normal.    Lab Results Lab Results  Component Value Date   WBC 4.9 04/07/2017   HGB 13.4 04/07/2017   HCT 39.2 04/07/2017   MCV 89.7 04/07/2017   PLT 256 04/07/2017    Lab Results  Component Value Date   CREATININE 1.12 (H) 04/07/2017   BUN 12 04/07/2017   NA 140 04/07/2017   K 4.2 04/07/2017   CL 107 04/07/2017   CO2 23 04/07/2017    Lab Results  Component Value Date   ALT 37 (H) 04/07/2017   AST 31 04/07/2017   ALKPHOS 58 12/31/2016   BILITOT 0.6 04/07/2017    Lab Results    Component Value Date   CHOL 184 04/07/2017   HDL 82 04/07/2017   LDLCALC 74 12/30/2016   TRIG 64 04/07/2017   CHOLHDL 2.2 04/07/2017   Lab Results  Component Value Date   LABRPR NON-REACTIVE 04/07/2017   HIV 1 RNA Quant (copies/mL)  Date Value  04/07/2017 1,910 (H)  05/25/2016 <20  12/26/2015 37 (H)   CD4 T Cell Abs (/uL)  Date Value  04/07/2017 1,010  05/25/2016 1,160  12/26/2015 810     Problem  List Items Addressed This Visit      High   HIV disease (HCC)    I reviewed her recent lab work with her and showed her how her viral load reactivated shortly after stopping Complera and Tivicay. I showed her how her lab work was much better last year when she was consistently taking her medication. She is not having any problems tolerating it. She has no evidence of intregrase resistance mutations. She is using her pillbox for all of her medications. She is eager to restart therapy. She will follow-up here after repeat lab work in 6 weeks.      Relevant Medications   emtricitabine-rilpivir-tenofovir DF (COMPLERA) 200-25-300 MG tablet   dolutegravir (TIVICAY) 50 MG tablet   Other Relevant Orders   T-helper cell (CD4)- ([facility] clinic only)   HIV 1 RNA quant-no reflex-bld     Unprioritized   Cigarette smoker    She continues to smoke cigarettes and does not feel that she is able to quit at this time.      Schizoaffective disorder (HCC)    She may have had mild recent depression but it seems to be in remission now. When she quit taking her antiretroviral make dictation she did continue taking Wellbutrin, Seroquel and Neurontin. She has not followed up with her [facility]. She knows that she can call at any time to set up an appointment.       Other Visit Diagnoses    Human immunodeficiency virus (HIV) disease (HCC)    -  Primary   Relevant Medications   emtricitabine-rilpivir-tenofovir DF (COMPLERA) 200-25-300 MG tablet   dolutegravir (TIVICAY) 50 MG tablet         Cliffton Asters, MD [facility] for Infectious Disease [facility] [phone] pager   [phone] cell 05/12/2017, 3:12 PM

## 2017-05-19 LAB — TEST AUTHORIZATION

## 2017-05-19 LAB — HIV-1 INTEGRASE GENOTYPE

## 2017-05-19 LAB — HIV-1 GENOTYPE: HIV-1 Genotype: DETECTED — AB

## 2017-06-18 ENCOUNTER — Other Ambulatory Visit: Payer: Self-pay | Admitting: Internal Medicine

## 2017-07-14 ENCOUNTER — Other Ambulatory Visit: Payer: Self-pay

## 2017-07-19 ENCOUNTER — Ambulatory Visit (INDEPENDENT_AMBULATORY_CARE_PROVIDER_SITE_OTHER): Payer: Medicaid Other | Admitting: Orthopaedic Surgery

## 2017-07-28 ENCOUNTER — Ambulatory Visit (INDEPENDENT_AMBULATORY_CARE_PROVIDER_SITE_OTHER): Payer: Medicaid Other | Admitting: Internal Medicine

## 2017-07-28 ENCOUNTER — Other Ambulatory Visit: Payer: Self-pay

## 2017-07-28 ENCOUNTER — Encounter: Payer: Self-pay | Admitting: Internal Medicine

## 2017-07-28 DIAGNOSIS — B20 Human immunodeficiency virus [HIV] disease: Secondary | ICD-10-CM | POA: Diagnosis not present

## 2017-07-28 DIAGNOSIS — F259 Schizoaffective disorder, unspecified: Secondary | ICD-10-CM | POA: Diagnosis not present

## 2017-07-28 DIAGNOSIS — F17210 Nicotine dependence, cigarettes, uncomplicated: Secondary | ICD-10-CM | POA: Diagnosis not present

## 2017-07-28 NOTE — Assessment & Plan Note (Signed)
She says that she is going to go ahead with her plan to try to quit completely.

## 2017-07-28 NOTE — Progress Notes (Signed)
Patient Active Problem List   Diagnosis Date Noted  . Conversion disorder     Priority: High  . HIV disease (HCC) 10/03/2015    Priority: High  . Cervical radiculopathy   . Chronic headaches 12/29/2016  . Breast pain, left 05/25/2016  . Dry skin dermatitis 02/10/2016  . Itching due to drug 12/26/2015  . Schizoaffective disorder (HCC) 10/03/2015  . Chronic pain syndrome 10/03/2015  . Cigarette smoker 10/03/2015    Patient's Medications  New Prescriptions   No medications on file  Previous Medications   BUPROPION (WELLBUTRIN XL) 300 MG 24 HR TABLET    Take 1 tablet (300 mg total) by mouth daily.   DOLUTEGRAVIR (TIVICAY) 50 MG TABLET    Take 1 tablet (50 mg total) by mouth daily.   EMTRICITABINE-RILPIVIR-TENOFOVIR DF (COMPLERA) 200-25-300 MG TABLET    Take 1 tablet by mouth daily.   GABAPENTIN (NEURONTIN) 300 MG CAPSULE    Take 300 mg by mouth 3 (three) times daily.   LIDO-CAPSAICIN-MEN-METHYL SAL (1ST MEDX-PATCH/ LIDOCAINE) 4-0.373-11-29 % PT[facility]    Apply 1 patch topically daily.   LORATADINE (CLARITIN) 10 MG TABLET    Take 10 mg by mouth daily.   OXYCODONE HCL 10 MG TABS    Take 10 mg by mouth 3 (three) times daily as needed (pain).    PROAIR HFA 108 (90 BASE) MCG/ACT INHALER    Inhale 2 inhalations into the lungs every 6 (six) hours as needed for Wheezing or Shortness of Breath   QUETIAPINE (SEROQUEL XR) 400 MG 24 HR TABLET    Take 1 tablet (400 mg total) by mouth at bedtime.   SUMATRIPTAN (IMITREX) 100 MG TABLET    Take 100 mg by mouth every 2 (two) hours as needed for migraine. May repeat in 2 hours if headache persists or recurs.   TOPIRAMATE (TOPAMAX) 100 MG TABLET    Take 1 tablet (100 mg total) by mouth daily.  Modified Medications   No medications on file  Discontinued Medications   CARISOPRODOL (SOMA) 350 MG TABLET    Take 1 tablet (350 mg total) by mouth 3 (three) times daily.   COMPLERA 200-25-300 MG TABLET    TAKE ONE TABLET BY MOUTH ONCE DAILY   FERROUS  SULFATE 325 (65 FE) MG TABLET    Take 325 mg by mouth daily with breakfast.   TIVICAY 50 MG TABLET    TAKE ONE TABLET BY MOUTH DAILY    Subjective: Tracy [name] is in for her routine HIV follow-up visit.  She did restart Complera and Tivicay after her last visit.  She denies missing any doses.  He started to feel better but then developed a bad cold and has had a persistent cough productive of yellow sputum.  She has not had any fever or shortness of breath.  She is feeling more depressed.  She says that she does not understand why she is more depressed.  She is gotten a part-time job that she enjoys with [employer].  She is down to 1 cigarette daily in hopes that she can quit.  Review of Systems: Review of Systems  Constitutional: Positive for malaise/fatigue. Negative for chills, diaphoresis, fever and weight loss.  HENT: Negative for sore throat.   Respiratory: Positive for cough and sputum production. Negative for shortness of breath.   Cardiovascular: Negative for chest pain.  Gastrointestinal: Negative for abdominal pain, diarrhea, heartburn, nausea and vomiting.  Genitourinary: Negative for dysuria and frequency.  Musculoskeletal: Negative for joint pain and myalgias.  Skin: Negative for rash.  Neurological: Negative for dizziness and headaches.  Psychiatric/Behavioral: Positive for depression. Negative for substance abuse. The patient is not nervous/anxious.     Past Medical History:  Diagnosis Date  . Chronic back pain   . Chronic headaches   . Depression   . HIV infection (HCC)   . Schizoaffective disorder (HCC)   . Stroke (cerebrum) [facility](HCC)     Social History   Tobacco Use  . Smoking status: Current Some Day Smoker    Packs/day: 0.30    Types: Cigarettes    Last attempt to quit: 04/26/2016    Years since quitting: 1.2  . Smokeless tobacco: Never Used  . Tobacco comment: cutting back 1-2 per wk  Substance Use Topics  . Alcohol use: No    Alcohol/week: 0.0 oz    Comment:  quit 12/2005  . Drug use: No    Comment: Previous drug use, quit 2004-05    Family History  Problem Relation Age of Onset  . Diabetes Mother   . Hypertension Mother   . Tremor Mother   . Breast cancer Maternal Aunt   . High blood pressure Sister   . High blood pressure Maternal Uncle   . Diabetes Maternal Uncle     Allergies  Allergen Reactions  . Tetracyclines & Related Itching  . Tramadol Itching  . Flexeril [Cyclobenzaprine] Rash  . Moxifloxacin Rash    Health Maintenance  Topic Date Due  . PAP SMEAR  07/07/1988  . COLONOSCOPY  07/07/2017  . MAMMOGRAM  10/06/2018  . TETANUS/TDAP  11/08/2023  . INFLUENZA VACCINE  Completed  . HIV Screening  Completed    Objective:  Vitals:   07/28/17 1341  Weight: 198 lb (89.8 kg)  Height: 5' 7.5" (1.715 m)   Body mass index is 30.55 kg/m.  Physical Exam  Constitutional: She is oriented to person, place, and time.  She sounds congested.  HENT:  Mouth/Throat: No oropharyngeal exudate.  Eyes: Conjunctivae are normal.  Cardiovascular: Normal rate and regular rhythm.  No murmur heard. Pulmonary/Chest: Effort normal and breath sounds normal. She has no wheezes. She has no rales.  Abdominal: Soft. She exhibits no mass. There is no tenderness.  Musculoskeletal: Normal range of motion.  Neurological: She is alert and oriented to person, place, and time.  Skin: No rash noted.  Psychiatric:  Flat affect.  She is somewhat tearful during the exam.    Lab Results Lab Results  Component Value Date   WBC 4.9 04/07/2017   HGB 13.4 04/07/2017   HCT 39.2 04/07/2017   MCV 89.7 04/07/2017   PLT 256 04/07/2017    Lab Results  Component Value Date   CREATININE 1.12 (H) 04/07/2017   BUN 12 04/07/2017   NA 140 04/07/2017   K 4.2 04/07/2017   CL 107 04/07/2017   CO2 23 04/07/2017    Lab Results  Component Value Date   ALT 37 (H) 04/07/2017   AST 31 04/07/2017   ALKPHOS 58 12/31/2016   BILITOT 0.6 04/07/2017    Lab Results   Component Value Date   CHOL 184 04/07/2017   HDL 82 04/07/2017   LDLCALC 74 12/30/2016   TRIG 64 04/07/2017   CHOLHDL 2.2 04/07/2017   Lab Results  Component Value Date   LABRPR NON-REACTIVE 04/07/2017   HIV 1 RNA Quant (copies/mL)  Date Value  04/07/2017 1,910 (H)  05/25/2016 <20  12/26/2015 37 (H)  CD4 T Cell Abs (/uL)  Date Value  04/07/2017 1,010  05/25/2016 1,160  12/26/2015 810     Problem List Items Addressed This Visit      High   HIV disease (HCC)    She is back on therapy and it sounds like her adherence has been perfect.  I will recheck lab work today and see her back in 1 month.      Relevant Orders   T-helper cell (CD4)- ([facility] clinic only)   HIV 1 RNA quant-no reflex-bld   CBC   Comprehensive metabolic panel   RPR   Lipid panel     Unprioritized   Cigarette smoker    She says that she is going to go ahead with her plan to try to quit completely.      Schizoaffective disorder (HCC)    I talked her about how she can use different members of our team to help develop a plan to address her depression.  Initially she was reluctant to accept counseling but now she agrees to see our counselor later this week.           Cliffton Asters, MD [facility] for Infectious Disease [facility] [phone] pager   [phone] cell 07/28/2017, 2:08 PM

## 2017-07-28 NOTE — Assessment & Plan Note (Signed)
She is back on therapy and it sounds like her adherence has been perfect.  I will recheck lab work today and see her back in 1 month.

## 2017-07-28 NOTE — Assessment & Plan Note (Signed)
I talked her about how she can use different members of our team to help develop a plan to address her depression.  Initially she was reluctant to accept counseling but now she agrees to see our counselor later this week.

## 2017-07-29 LAB — COMPREHENSIVE METABOLIC PANEL
AG Ratio: 1.7 (calc) (ref 1.0–2.5)
ALKALINE PHOSPHATASE (APISO): 54 U/L (ref 33–130)
ALT: 12 U/L (ref 6–29)
AST: 17 U/L (ref 10–35)
Albumin: 4.3 g/dL (ref 3.6–5.1)
BUN: 8 mg/dL (ref 7–25)
CO2: 24 mmol/L (ref 20–32)
CREATININE: 1.02 mg/dL (ref 0.50–1.05)
Calcium: 9.4 mg/dL (ref 8.6–10.4)
Chloride: 107 mmol/L (ref 98–110)
GLOBULIN: 2.5 g/dL (ref 1.9–3.7)
GLUCOSE: 85 mg/dL (ref 65–99)
Potassium: 4.3 mmol/L (ref 3.5–5.3)
Sodium: 139 mmol/L (ref 135–146)
Total Bilirubin: 0.5 mg/dL (ref 0.2–1.2)
Total Protein: 6.8 g/dL (ref 6.1–8.1)

## 2017-07-29 LAB — CBC
HCT: 40.7 % (ref 35.0–45.0)
Hemoglobin: 14 g/dL (ref 11.7–15.5)
MCH: 31.3 pg (ref 27.0–33.0)
MCHC: 34.4 g/dL (ref 32.0–36.0)
MCV: 90.8 fL (ref 80.0–100.0)
MPV: 10.2 fL (ref 7.5–12.5)
PLATELETS: 247 10*3/uL (ref 140–400)
RBC: 4.48 10*6/uL (ref 3.80–5.10)
RDW: 13.5 % (ref 11.0–15.0)
WBC: 5.5 10*3/uL (ref 3.8–10.8)

## 2017-07-29 LAB — LIPID PANEL
CHOL/HDL RATIO: 3.1 (calc) (ref <5.0)
CHOLESTEROL: 175 mg/dL (ref <200)
HDL: 57 mg/dL (ref >50)
LDL CHOLESTEROL (CALC): 97 mg/dL
NON-HDL CHOLESTEROL (CALC): 118 mg/dL (ref <130)
Triglycerides: 112 mg/dL (ref <150)

## 2017-07-29 LAB — T-HELPER CELL (CD4) - (RCID CLINIC ONLY)
CD4 T CELL HELPER: 41 % (ref 33–55)
CD4 T Cell Abs: 1100 /uL (ref 400–2700)

## 2017-07-29 LAB — RPR: RPR Ser Ql: NONREACTIVE

## 2017-07-30 LAB — HIV-1 RNA QUANT-NO REFLEX-BLD
HIV 1 RNA Quant: <20 copies/mL
HIV-1 RNA QUANT, LOG: NOT DETECTED {Log_copies}/mL

## 2017-08-05 ENCOUNTER — Ambulatory Visit: Payer: Self-pay

## 2017-08-26 ENCOUNTER — Ambulatory Visit: Payer: Self-pay | Admitting: Internal Medicine

## 2017-08-31 ENCOUNTER — Encounter: Payer: Self-pay | Admitting: Internal Medicine

## 2017-08-31 ENCOUNTER — Ambulatory Visit (INDEPENDENT_AMBULATORY_CARE_PROVIDER_SITE_OTHER): Payer: Medicaid Other | Admitting: Internal Medicine

## 2017-08-31 DIAGNOSIS — B20 Human immunodeficiency virus [HIV] disease: Secondary | ICD-10-CM | POA: Diagnosis present

## 2017-08-31 NOTE — Assessment & Plan Note (Signed)
Her infection has come back under excellent control since restarting therapy several months ago.  She will follow-up after lab work in 3 months.

## 2017-08-31 NOTE — Progress Notes (Signed)
Patient Active Problem List   Diagnosis Date Noted  . Conversion disorder     Priority: High  . HIV disease (HCC) 10/03/2015    Priority: High  . Cervical radiculopathy   . Chronic headaches 12/29/2016  . Breast pain, left 05/25/2016  . Dry skin dermatitis 02/10/2016  . Itching due to drug 12/26/2015  . Schizoaffective disorder (HCC) 10/03/2015  . Chronic pain syndrome 10/03/2015  . Cigarette smoker 10/03/2015    Patient's Medications  New Prescriptions   No medications on file  Previous Medications   BUPROPION (WELLBUTRIN XL) 300 MG 24 HR TABLET    Take 1 tablet (300 mg total) by mouth daily.   DOLUTEGRAVIR (TIVICAY) 50 MG TABLET    Take 1 tablet (50 mg total) by mouth daily.   EMTRICITABINE-RILPIVIR-TENOFOVIR DF (COMPLERA) 200-25-300 MG TABLET    Take 1 tablet by mouth daily.   GABAPENTIN (NEURONTIN) 300 MG CAPSULE    Take 300 mg by mouth 3 (three) times daily.   LIDO-CAPSAICIN-MEN-METHYL SAL (1ST MEDX-PATCH/ LIDOCAINE) 4-0.373-11-29 % PTCH    Apply 1 patch topically daily.   LORATADINE (CLARITIN) 10 MG TABLET    Take 10 mg by mouth daily.   OXYCODONE HCL 10 MG TABS    Take 10 mg by mouth 3 (three) times daily as needed (pain).    PROAIR HFA 108 (90 BASE) MCG/ACT INHALER    Inhale 2 inhalations into the lungs every 6 (six) hours as needed for Wheezing or Shortness of Breath   QUETIAPINE (SEROQUEL XR) 400 MG 24 HR TABLET    Take 1 tablet (400 mg total) by mouth at bedtime.   SUMATRIPTAN (IMITREX) 100 MG TABLET    Take 100 mg by mouth every 2 (two) hours as needed for migraine. May repeat in 2 hours if headache persists or recurs.   TOPIRAMATE (TOPAMAX) 100 MG TABLET    Take 1 tablet (100 mg total) by mouth daily.  Modified Medications   No medications on file  Discontinued Medications   No medications on file    Subjective: Tracy [name] is in for her routine follow-up visit.  She has had no problems obtaining, taking or tolerating her Complera or Tivicay and has not  missed any doses.  She is working 4 hours a day at [employer].  She is no longer feeling depressed.  She tells me that she will not make the mistake of stopping her medication again  Review of Systems: Review of Systems  Constitutional: Negative for chills, diaphoresis, fever, malaise/fatigue and weight loss.  HENT: Negative for sore throat.   Respiratory: Negative for cough, sputum production and shortness of breath.   Cardiovascular: Negative for chest pain.  Gastrointestinal: Negative for abdominal pain, diarrhea, heartburn, nausea and vomiting.  Genitourinary: Negative for dysuria and frequency.  Musculoskeletal: Negative for joint pain and myalgias.  Skin: Negative for rash.  Neurological: Negative for dizziness and headaches.  Psychiatric/Behavioral: Negative for depression and substance abuse. The patient is not nervous/anxious.     Past Medical History:  Diagnosis Date  . Chronic back pain   . Chronic headaches   . Depression   . HIV infection (HCC)   . Schizoaffective disorder (HCC)   . Stroke (cerebrum) [facility])     Social History   Tobacco Use  . Smoking status: Current Some Day Smoker    Packs/day: 0.30    Types: Cigarettes    Last attempt to quit: 04/26/2016    Years  since quitting: 1.3  . Smokeless tobacco: Never Used  . Tobacco comment: cutting back 1-2 per wk  Substance Use Topics  . Alcohol use: No    Alcohol/week: 0.0 oz    Comment: quit 12/2005  . Drug use: No    Comment: Previous drug use, quit 2004-05    Family History  Problem Relation Age of Onset  . Diabetes Mother   . Hypertension Mother   . Tremor Mother   . Breast cancer Maternal Aunt   . High blood pressure Sister   . High blood pressure Maternal Uncle   . Diabetes Maternal Uncle     Allergies  Allergen Reactions  . Tetracyclines & Related Itching  . Tramadol Itching  . Flexeril [Cyclobenzaprine] Rash  . Moxifloxacin Rash    Health Maintenance  Topic Date Due  . PAP SMEAR   07/07/1988  . COLONOSCOPY  07/07/2017  . MAMMOGRAM  10/06/2018  . TETANUS/TDAP  11/08/2023  . INFLUENZA VACCINE  Completed  . HIV Screening  Completed    Objective:  Vitals:   08/31/17 1028  BP: 114/76  Pulse: 80  Temp: 97.8 F (36.6 C)  TempSrc: Oral  Weight: 201 lb 12.8 oz (91.5 kg)   Body mass index is 31.14 kg/m.  Physical Exam  Constitutional: She is oriented to person, place, and time.  HENT:  Mouth/Throat: No oropharyngeal exudate.  Eyes: Conjunctivae are normal.  Cardiovascular: Normal rate and regular rhythm.  No murmur heard. Pulmonary/Chest: Effort normal and breath sounds normal.  Abdominal: Soft. She exhibits no mass. There is no tenderness.  Musculoskeletal: Normal range of motion.  Neurological: She is alert and oriented to person, place, and time.  Skin: No rash noted.  Psychiatric: Mood and affect normal.    Lab Results Lab Results  Component Value Date   WBC 5.5 07/28/2017   HGB 14.0 07/28/2017   HCT 40.7 07/28/2017   MCV 90.8 07/28/2017   PLT 247 07/28/2017    Lab Results  Component Value Date   CREATININE 1.02 07/28/2017   BUN 8 07/28/2017   NA 139 07/28/2017   K 4.3 07/28/2017   CL 107 07/28/2017   CO2 24 07/28/2017    Lab Results  Component Value Date   ALT 12 07/28/2017   AST 17 07/28/2017   ALKPHOS 58 12/31/2016   BILITOT 0.5 07/28/2017    Lab Results  Component Value Date   CHOL 175 07/28/2017   HDL 57 07/28/2017   LDLCALC 74 12/30/2016   TRIG 112 07/28/2017   CHOLHDL 3.1 07/28/2017   Lab Results  Component Value Date   LABRPR NON-REACTIVE 07/28/2017   HIV 1 RNA Quant (copies/mL)  Date Value  07/28/2017 <20 NOT DETECTED  04/07/2017 1,910 (H)  05/25/2016 <20   CD4 T Cell Abs (/uL)  Date Value  07/28/2017 1,100  04/07/2017 1,010  05/25/2016 1,160     Problem List Items Addressed This Visit      High   HIV disease (HCC)    Her infection has come back under excellent control since restarting therapy  several months ago.  She will follow-up after lab work in 3 months.      Relevant Orders   T-helper cell (CD4)- ([facility] clinic only)   HIV 1 RNA quant-no reflex-bld        Cliffton AstersJohn Rinnah Peppel, MD [facility] [phone] pager   [phone] cell 08/31/2017, 10:54 AM

## 2017-11-18 ENCOUNTER — Other Ambulatory Visit: Payer: Self-pay

## 2017-11-26 ENCOUNTER — Emergency Department (HOSPITAL_COMMUNITY)
Admission: EM | Admit: 2017-11-26 | Discharge: 2017-11-26 | Discharge status: Against Medical Advice | Payer: Medicaid Other | Attending: Emergency Medicine | Admitting: Emergency Medicine

## 2017-11-26 ENCOUNTER — Encounter (HOSPITAL_COMMUNITY): Payer: Self-pay | Admitting: Emergency Medicine

## 2017-11-26 DIAGNOSIS — R05 Cough: Secondary | ICD-10-CM | POA: Diagnosis present

## 2017-11-26 DIAGNOSIS — Z5321 Procedure and treatment not carried out due to patient leaving prior to being seen by health care provider: Secondary | ICD-10-CM | POA: Insufficient documentation

## 2017-11-26 LAB — CBC
HEMATOCRIT: 38.2 % (ref 36.0–46.0)
HEMOGLOBIN: 13.3 g/dL (ref 12.0–15.0)
MCH: 32.1 pg (ref 26.0–34.0)
MCHC: 34.8 g/dL (ref 30.0–36.0)
MCV: 92.3 fL (ref 78.0–100.0)
Platelets: 241 10*3/uL (ref 150–400)
RBC: 4.14 MIL/uL (ref 3.87–5.11)
RDW: 13.4 % (ref 11.5–15.5)
WBC: 5 10*3/uL (ref 4.0–10.5)

## 2017-11-26 LAB — COMPREHENSIVE METABOLIC PANEL
ALT: 14 U/L (ref 14–54)
ANION GAP: 10 (ref 5–15)
AST: 16 U/L (ref 15–41)
Albumin: 4.2 g/dL (ref 3.5–5.0)
Alkaline Phosphatase: 51 U/L (ref 38–126)
BILIRUBIN TOTAL: 0.8 mg/dL (ref 0.3–1.2)
BUN: 8 mg/dL (ref 6–20)
CO2: 18 mmol/L — ABNORMAL LOW (ref 22–32)
Calcium: 9.1 mg/dL (ref 8.9–10.3)
Chloride: 116 mmol/L — ABNORMAL HIGH (ref 101–111)
Creatinine, Ser: 1.2 mg/dL — ABNORMAL HIGH (ref 0.44–1.00)
GFR calc Af Amer: >60 mL/min (ref >60)
GFR, EST NON AFRICAN AMERICAN: 52 mL/min — AB (ref >60)
Glucose, Bld: 93 mg/dL (ref 65–99)
POTASSIUM: 3.4 mmol/L — AB (ref 3.5–5.1)
Sodium: 144 mmol/L (ref 135–145)
TOTAL PROTEIN: 7.1 g/dL (ref 6.5–8.1)

## 2017-11-26 LAB — LIPASE, BLOOD: Lipase: 25 U/L (ref 11–51)

## 2017-11-26 NOTE — ED Notes (Signed)
Pt sts she is leaving to go to [facility] bc she  Cant wait any longer

## 2017-11-26 NOTE — ED Triage Notes (Signed)
Per pt, states she has been vomiting for 26 hours-states she has been coughing as well-complaining of a headache

## 2017-12-02 ENCOUNTER — Ambulatory Visit: Payer: Self-pay | Admitting: Internal Medicine

## 2018-02-27 ENCOUNTER — Emergency Department (HOSPITAL_COMMUNITY)
Admission: EM | Admit: 2018-02-27 | Discharge: 2018-02-27 | Disposition: A | Discharge status: Home / Self Care | Payer: Medicaid Other | Attending: Emergency Medicine | Admitting: Emergency Medicine

## 2018-02-27 ENCOUNTER — Encounter (HOSPITAL_COMMUNITY): Payer: Self-pay | Admitting: Emergency Medicine

## 2018-02-27 DIAGNOSIS — M25512 Pain in left shoulder: Secondary | ICD-10-CM | POA: Diagnosis present

## 2018-02-27 DIAGNOSIS — Z79899 Other long term (current) drug therapy: Secondary | ICD-10-CM | POA: Insufficient documentation

## 2018-02-27 DIAGNOSIS — M5410 Radiculopathy, site unspecified: Secondary | ICD-10-CM

## 2018-02-27 DIAGNOSIS — B20 Human immunodeficiency virus [HIV] disease: Secondary | ICD-10-CM | POA: Insufficient documentation

## 2018-02-27 DIAGNOSIS — F17210 Nicotine dependence, cigarettes, uncomplicated: Secondary | ICD-10-CM | POA: Insufficient documentation

## 2018-02-27 DIAGNOSIS — G8929 Other chronic pain: Secondary | ICD-10-CM | POA: Insufficient documentation

## 2018-02-27 DIAGNOSIS — M545 Low back pain, unspecified: Secondary | ICD-10-CM

## 2018-02-27 NOTE — Discharge Instructions (Addendum)
Please read attached information. If you experience any new or worsening signs or symptoms please return to the emergency room for evaluation. Please follow-up with your primary care provider or specialist as discussed.  °

## 2018-02-27 NOTE — ED Triage Notes (Signed)
Pt has hx of disc problems to lumbar spine. She arrives today for evaluation of 2 weeks of left shoulder pain with a decrease in ROM to the left arm, with numbness to the left hip down the left leg that is also painful. She describes it as a shooting pain sensation down the left leg.

## 2018-02-27 NOTE — ED Notes (Signed)
Patient able to ambulate independently  

## 2018-02-27 NOTE — ED Provider Notes (Signed)
[facility] Note   CSN: [id number] Arrival date & time: 02/27/18  1238     History   Chief Complaint Chief Complaint  Patient presents with  . Shoulder Pain    HPI Tracy [name] is a 52 y.o. female.  HPI   52 year old female presents today with several complaints.  Patient notes over the last 2 days she has had pain in her left shoulder and difficulty achieving complete range of motion.  Patient notes pain with forward flexion at 90 degrees, she notes she is unable to lift this beyond 90 degrees without significant discomfort.  She denies any neurological deficits, denies any trauma but does note that she works moving boxes regularly.  Patient also notes numbness over the left lateral thigh.  She notes this is been ongoing for 2 weeks, she notes chronic lower back pain that is unchanged, denies any trauma.  She denies of sensation distal to this, denies any loss of strength or motor function.  Patient notes she is seen by her primary care for back pain but has not been evaluated by orthopedic specialist.  Patient notes taking oxycodone daily as needed for pain.  Past Medical History:  Diagnosis Date  . Chronic back pain   . Chronic headaches   . Depression   . HIV infection (HCC)   . Schizoaffective disorder (HCC)   . Stroke (cerebrum) [facility](HCC)     Patient Active Problem List   Diagnosis Date Noted  . Conversion disorder   . Cervical radiculopathy   . Chronic headaches 12/29/2016  . Breast pain, left 05/25/2016  . Dry skin dermatitis 02/10/2016  . Itching due to drug 12/26/2015  . HIV disease (HCC) 10/03/2015  . Schizoaffective disorder (HCC) 10/03/2015  . Chronic pain syndrome 10/03/2015  . Cigarette smoker 10/03/2015    Past Surgical History:  Procedure Laterality Date  . ABDOMINAL HYSTERECTOMY    . BREAST EXCISIONAL BIOPSY Right 1990's  . BREAST SURGERY       OB History   None      Home Medications     Prior to Admission medications   Medication Sig Start Date End Date Taking? Authorizing Provider  buPROPion (WELLBUTRIN XL) 300 MG 24 hr tablet Take 1 tablet (300 mg total) by mouth daily. 10/03/15   Cliffton Astersampbell, John, MD  dolutegravir (TIVICAY) 50 MG tablet Take 1 tablet (50 mg total) by mouth daily. 05/12/17   Cliffton Astersampbell, John, MD  emtricitabine-rilpivir-tenofovir DF (COMPLERA) 200-25-300 MG tablet Take 1 tablet by mouth daily. 05/12/17   Cliffton Astersampbell, John, MD  gabapentin (NEURONTIN) 300 MG capsule Take 300 mg by mouth 3 (three) times daily.    [provider]  Lido-Capsaicin-Men-Methyl Sal (1ST MEDX-PATCH/ LIDOCAINE) 4-0.373-11-29 % PTCH Apply 1 patch topically daily. 02/05/17   McDonald, Mia A, PA-C  loratadine (CLARITIN) 10 MG tablet Take 10 mg by mouth daily.    [provider]  Oxycodone HCl 10 MG TABS Take 10 mg by mouth 3 (three) times daily as needed (pain).     [provider]  PROAIR HFA 108 [id number](90 Base) MCG/ACT inhaler Inhale 2 inhalations into the lungs every 6 (six) hours as needed for Wheezing or Shortness of Breath 06/16/17   [provider]  QUEtiapine (SEROQUEL XR) 400 MG 24 hr tablet Take 1 tablet (400 mg total) by mouth at bedtime. 10/03/15   Cliffton Astersampbell, John, MD  SUMAtriptan (IMITREX) 100 MG tablet Take 100 mg by mouth every 2 (two) hours as needed  for migraine. May repeat in 2 hours if headache persists or recurs.    [provider]  topiramate (TOPAMAX) 100 MG tablet Take 1 tablet (100 mg total) by mouth daily. 10/03/15   Cliffton Astersampbell, John, MD    Family History Family History  Problem Relation Age of Onset  . Diabetes Mother   . Hypertension Mother   . Tremor Mother   . Breast cancer Maternal Aunt   . High blood pressure Sister   . High blood pressure Maternal Uncle   . Diabetes Maternal Uncle     Social History Social History   Tobacco Use  . Smoking status: Current Some Day Smoker    Packs/day: 0.30    Types: Cigarettes    Last  attempt to quit: 04/26/2016    Years since quitting: 1.8  . Smokeless tobacco: Never Used  . Tobacco comment: cutting back 1-2 per wk  Substance Use Topics  . Alcohol use: No    Alcohol/week: 0.0 standard drinks    Comment: quit 12/2005  . Drug use: No    Comment: Previous drug use, quit 2004-05     Allergies   Tetracyclines & related; Tramadol; Flexeril [cyclobenzaprine]; and Moxifloxacin   Review of Systems Review of Systems  All other systems reviewed and are negative.    Physical Exam Updated Vital Signs BP 113/82 (BP Location: Right Arm)   Pulse (!) 59   Temp 98.2 F (36.8 C) (Oral)   Resp 14   Ht 5' 7.5" (1.715 m)   Wt 78 kg   SpO2 98%   BMI 26.54 kg/m   Physical Exam  Constitutional: She is oriented to person, place, and time. She appears well-developed and well-nourished.  HENT:  Head: Normocephalic and atraumatic.  Eyes: Pupils are equal, round, and reactive to light. Conjunctivae are normal. Right eye exhibits no discharge. Left eye exhibits no discharge. No scleral icterus.  Neck: Normal range of motion. No JVD present. No tracheal deviation present.  Pulmonary/Chest: Effort normal. No stridor.  Musculoskeletal:  No C or T-spine tenderness palpation, tenderness palpation of the lower lumbar spine and surrounding soft tissue, straight leg negative, decreased sensation over the mid thigh, no proximal or distal loss of sensation, 5 out of 5 flexion and extension at the hip knee and ankle  Neurological: She is alert and oriented to person, place, and time. Coordination normal.  Psychiatric: She has a normal mood and affect. Her behavior is normal. Judgment and thought content normal.  Nursing note and vitals reviewed.    ED Treatments / Results  Labs (all labs ordered are listed, but only abnormal results are displayed) Labs Reviewed - No data to display  EKG None  Radiology No results found.  Procedures Procedures (including critical care  time)  Medications Ordered in ED Medications - No data to display   Initial Impression / Assessment and Plan / ED Course  I have reviewed the triage vital signs and the nursing notes.  Pertinent labs & imaging results that were available during my care of the patient were reviewed by me and considered in my medical decision making (see chart for details).     52 year old female presents today with multiple complaints.  Likely overuse injury of the left upper shoulder, also paresthesia in her left thigh, question radicular symptoms from her back, no major neurological deficits or indications for immediate imaging or neurosurgical evaluation.  Patient will benefit from outpatient follow-up, symptomatic care given, strict return precautions given.  She verbalized  understanding and agreement to today's plan.  Final Clinical Impressions(s) / ED Diagnoses   Final diagnoses:  Acute pain of left shoulder  Chronic low back pain without sciatica, unspecified back pain laterality  Radiculopathy, unspecified spinal region    ED Discharge Orders    None       Rosalio Loud 02/27/18 1542    Terrilee Files, MD 02/27/18 [phone]

## 2018-03-02 ENCOUNTER — Ambulatory Visit (INDEPENDENT_AMBULATORY_CARE_PROVIDER_SITE_OTHER): Payer: Medicaid Other | Admitting: Licensed Clinical Social Worker

## 2018-03-02 ENCOUNTER — Other Ambulatory Visit: Payer: Medicaid Other

## 2018-03-02 DIAGNOSIS — F4312 Post-traumatic stress disorder, chronic: Secondary | ICD-10-CM

## 2018-03-02 DIAGNOSIS — B20 Human immunodeficiency virus [HIV] disease: Secondary | ICD-10-CM

## 2018-03-03 LAB — T-HELPER CELL (CD4) - (RCID CLINIC ONLY)
CD4 T CELL HELPER: 48 % (ref 33–55)
CD4 T Cell Abs: 1120 /uL (ref 400–2700)
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
## 2018-03-04 LAB — HIV-1 RNA QUANT-NO REFLEX-BLD
HIV 1 RNA Quant: 49 copies/mL — ABNORMAL HIGH
HIV-1 RNA QUANT, LOG: 1.69 {Log_copies}/mL — AB

## 2018-03-10 ENCOUNTER — Ambulatory Visit (INDEPENDENT_AMBULATORY_CARE_PROVIDER_SITE_OTHER): Payer: Medicaid Other | Admitting: Licensed Clinical Social Worker

## 2018-03-10 DIAGNOSIS — F4312 Post-traumatic stress disorder, chronic: Secondary | ICD-10-CM | POA: Diagnosis not present
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
## 2018-03-17 ENCOUNTER — Ambulatory Visit: Payer: Self-pay | Admitting: Licensed Clinical Social Worker

## 2018-03-17 ENCOUNTER — Institutional Professional Consult (permissible substitution): Payer: Self-pay | Admitting: Licensed Clinical Social Worker

## 2018-03-23 ENCOUNTER — Encounter: Payer: Self-pay | Admitting: Internal Medicine

## 2018-03-23 ENCOUNTER — Ambulatory Visit (INDEPENDENT_AMBULATORY_CARE_PROVIDER_SITE_OTHER): Payer: Medicaid Other | Admitting: Internal Medicine

## 2018-03-23 ENCOUNTER — Ambulatory Visit (INDEPENDENT_AMBULATORY_CARE_PROVIDER_SITE_OTHER): Payer: Medicaid Other | Admitting: Licensed Clinical Social Worker

## 2018-03-23 DIAGNOSIS — B20 Human immunodeficiency virus [HIV] disease: Secondary | ICD-10-CM

## 2018-03-23 DIAGNOSIS — F250 Schizoaffective disorder, bipolar type: Secondary | ICD-10-CM | POA: Diagnosis not present

## 2018-03-23 DIAGNOSIS — Z23 Encounter for immunization: Secondary | ICD-10-CM | POA: Diagnosis not present

## 2018-03-23 DIAGNOSIS — F4312 Post-traumatic stress disorder, chronic: Secondary | ICD-10-CM | POA: Diagnosis not present

## 2018-03-23 DIAGNOSIS — G894 Chronic pain syndrome: Secondary | ICD-10-CM | POA: Diagnosis not present

## 2018-03-23 DIAGNOSIS — F17210 Nicotine dependence, cigarettes, uncomplicated: Secondary | ICD-10-CM | POA: Diagnosis not present

## 2018-03-23 NOTE — Assessment & Plan Note (Signed)
I talked to her about making sure that back surgery would be the option of last resort.

## 2018-03-23 NOTE — Progress Notes (Signed)
Patient Active Problem List   Diagnosis Date Noted  . Conversion disorder     Priority: High  . HIV disease (HCC) 10/03/2015    Priority: High  . Cervical radiculopathy   . Chronic headaches 12/29/2016  . Breast pain, left 05/25/2016  . Dry skin dermatitis 02/10/2016  . Itching due to drug 12/26/2015  . Schizoaffective disorder (HCC) 10/03/2015  . Chronic pain syndrome 10/03/2015  . Cigarette smoker 10/03/2015    Patient's Medications  New Prescriptions   No medications on file  Previous Medications   BUPROPION (WELLBUTRIN XL) 300 MG 24 HR TABLET    Take 1 tablet (300 mg total) by mouth daily.   DOLUTEGRAVIR (TIVICAY) 50 MG TABLET    Take 1 tablet (50 mg total) by mouth daily.   EMTRICITABINE-RILPIVIR-TENOFOVIR DF (COMPLERA) 200-25-300 MG TABLET    Take 1 tablet by mouth daily.   GABAPENTIN (NEURONTIN) 300 MG CAPSULE    Take 300 mg by mouth 3 (three) times daily.   LIDO-CAPSAICIN-MEN-METHYL SAL (1ST MEDX-PATCH/ LIDOCAINE) 4-0.373-11-29 % PTCH    Apply 1 patch topically daily.   LORATADINE (CLARITIN) 10 MG TABLET    Take 10 mg by mouth daily.   OXYCODONE HCL 10 MG TABS    Take 10 mg by mouth 3 (three) times daily as needed (pain).    PROAIR HFA 108 (90 BASE) MCG/ACT INHALER    Inhale 2 inhalations into the lungs every 6 (six) hours as needed for Wheezing or Shortness of Breath   QUETIAPINE (SEROQUEL XR) 400 MG 24 HR TABLET    Take 1 tablet (400 mg total) by mouth at bedtime.   SUMATRIPTAN (IMITREX) 100 MG TABLET    Take 100 mg by mouth every 2 (two) hours as needed for migraine. May repeat in 2 hours if headache persists or recurs.   TOPIRAMATE (TOPAMAX) 100 MG TABLET    Take 1 tablet (100 mg total) by mouth daily.  Modified Medications   No medications on file  Discontinued Medications   No medications on file    Subjective: Tracy [name] is in for her routine HIV follow-up visit.  She has had no problems obtaining, taking or tolerating her Complera and Tivicay and  has not missed any doses.  She is still struggling with depression but feels that meeting with our counselor, Angus Palms, is helping.  She has a scheduled visit today.  She has been referred to a back surgeon on 03/28/2018.  She says that her back pain and left hip numbness have become unbearable and that she is tried steroid injections and physical therapy without any relief.  She had to quit her recent job at [employer] and [employer] because of the pain.  She is still smoking cigarettes.  Review of Systems: Review of Systems  Constitutional: Positive for malaise/fatigue. Negative for chills, diaphoresis, fever and weight loss.  HENT: Negative for sore throat.   Respiratory: Negative for cough, sputum production and shortness of breath.   Cardiovascular: Negative for chest pain.  Gastrointestinal: Negative for abdominal pain, diarrhea, heartburn, nausea and vomiting.  Genitourinary: Negative for dysuria and frequency.  Musculoskeletal: Positive for back pain and joint pain. Negative for myalgias.  Skin: Negative for rash.  Neurological: Positive for sensory change. Negative for dizziness, focal weakness and headaches.  Psychiatric/Behavioral: Positive for depression. Negative for substance abuse and suicidal ideas. The patient is nervous/anxious.     Past Medical History:  Diagnosis Date  . Chronic back  pain   . Chronic headaches   . Depression   . HIV infection (HCC)   . Schizoaffective disorder (HCC)   . Stroke (cerebrum) [facility])     Social History   Tobacco Use  . Smoking status: Current Some Day Smoker    Packs/day: 0.30    Types: Cigarettes    Last attempt to quit: 04/26/2016    Years since quitting: 1.9  . Smokeless tobacco: Never Used  . Tobacco comment: cutting back 1-2 per wk  Substance Use Topics  . Alcohol use: No    Alcohol/week: 0.0 standard drinks    Comment: quit 12/2005  . Drug use: No    Comment: Previous drug use, quit 2004-05    Family History  Problem  Relation Age of Onset  . Diabetes Mother   . Hypertension Mother   . Tremor Mother   . Breast cancer Maternal Aunt   . High blood pressure Sister   . High blood pressure Maternal Uncle   . Diabetes Maternal Uncle     Allergies  Allergen Reactions  . Tetracyclines & Related Itching  . Tramadol Itching  . Flexeril [Cyclobenzaprine] Rash  . Moxifloxacin Rash    Health Maintenance  Topic Date Due  . PAP SMEAR  07/07/1988  . COLONOSCOPY  07/07/2017  . INFLUENZA VACCINE  02/10/2018  . MAMMOGRAM  10/06/2018  . TETANUS/TDAP  11/08/2023  . HIV Screening  Completed    Objective:  Vitals:   03/23/18 0851  BP: 131/82  Pulse: 96  Temp: 98.2 F (36.8 C)  TempSrc: Oral  Weight: 177 lb (80.3 kg)   Body mass index is 27.31 kg/m.  Physical Exam  Constitutional: She is oriented to person, place, and time.  She is smiling and in good spirits.  HENT:  Mouth/Throat: No oropharyngeal exudate.  Eyes: Conjunctivae are normal.  Cardiovascular: Normal rate, regular rhythm and normal heart sounds.  No murmur heard. Pulmonary/Chest: Effort normal and breath sounds normal. She has no wheezes. She has no rales.  Abdominal: Soft. She exhibits no mass. There is no tenderness.  Musculoskeletal: Normal range of motion.  Neurological: She is alert and oriented to person, place, and time.  Skin: No rash noted.  Psychiatric: She has a normal mood and affect.    Lab Results Lab Results  Component Value Date   WBC 5.0 11/26/2017   HGB 13.3 11/26/2017   HCT 38.2 11/26/2017   MCV 92.3 11/26/2017   PLT 241 11/26/2017    Lab Results  Component Value Date   CREATININE 1.20 (H) 11/26/2017   BUN 8 11/26/2017   NA 144 11/26/2017   K 3.4 (L) 11/26/2017   CL 116 (H) 11/26/2017   CO2 18 (L) 11/26/2017    Lab Results  Component Value Date   ALT 14 11/26/2017   AST 16 11/26/2017   ALKPHOS 51 11/26/2017   BILITOT 0.8 11/26/2017    Lab Results  Component Value Date   CHOL 175 07/28/2017    HDL 57 07/28/2017   LDLCALC 97 07/28/2017   TRIG 112 07/28/2017   CHOLHDL 3.1 07/28/2017   Lab Results  Component Value Date   LABRPR NON-REACTIVE 07/28/2017   HIV 1 RNA Quant (copies/mL)  Date Value  03/02/2018 49 (H)  07/28/2017 <20 NOT DETECTED  04/07/2017 1,910 (H)   CD4 T Cell Abs (/uL)  Date Value  03/02/2018 1,120  07/28/2017 1,100  04/07/2017 1,010     Problem List Items Addressed This Visit  High   HIV disease (HCC)    She has low level viral activation but overall her infection is under much better control than it was one year ago when she stopped taking her medication.  She received her influenza vaccination today.  She will continue her current regimen and follow-up after blood work in 6 months.      Relevant Orders   T-helper cell (CD4)- ([facility] clinic only)   HIV 1 RNA quant-no reflex-bld   CBC   Comprehensive metabolic panel   Lipid panel   RPR     Unprioritized   Chronic pain syndrome    I talked to her about making sure that back surgery would be the option of last resort.      Cigarette smoker    I talked to her about the importance of cigarette cessation, especially in light of potential upcoming surgery.      Schizoaffective disorder (HCC)    I encouraged her to stay in regular counseling.           Cliffton Asters, MD [facility] [phone] pager   [phone] cell 03/23/2018, 9:06 AM
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
## 2018-03-23 NOTE — Assessment & Plan Note (Signed)
She has low level viral activation but overall her infection is under much better control than it was one year ago when she stopped taking her medication.  She received her influenza vaccination today.  She will continue her current regimen and follow-up after blood work in 6 months.

## 2018-03-23 NOTE — Assessment & Plan Note (Signed)
I talked to her about the importance of cigarette cessation, especially in light of potential upcoming surgery.

## 2018-03-23 NOTE — Assessment & Plan Note (Signed)
I encouraged her to stay in regular counseling.

## 2018-03-31 ENCOUNTER — Ambulatory Visit (INDEPENDENT_AMBULATORY_CARE_PROVIDER_SITE_OTHER): Payer: Medicaid Other | Admitting: Licensed Clinical Social Worker

## 2018-03-31 DIAGNOSIS — F4312 Post-traumatic stress disorder, chronic: Secondary | ICD-10-CM

## 2018-03-31 NOTE — BH Specialist Note (Signed)
Integrated Behavioral Health Follow Up Visit  MRN: [id number] Name: Tracy [name]  Number of Integrated Behavioral Health Clinician visits: 4/6 Session Start time: 10:00am  Session End time: 10:50am Total time: 50 minutes  Type of Service: Integrated Behavioral Health- Individual/Family Interpretor:No. Interpretor Name and Language: n/a  SUBJECTIVE: Tracy [name] is a 52 y.o. female accompanied by husband Patient reports the following symptoms/concerns: feeling as though life is out of order, increased need for cleanliness, feeling unimportant to/arguing with husband  OBJECTIVE: Mood: Anxious and Affect: Constricted Risk of harm to self or others: No plan to harm self or others  LIFE CONTEXT: Husband accompanies patient in session today. Patient reports that she has been feeling "out of order" lately, which she attributes to her back pain and being unable to do for herself all the things she normally does, including/especailly cleaning. She indicates that she and husband have been having some arguments and she feels unimportant to him sometimes. Patient also reports that she is anxious about having to wait 2 months to find out what the next steps are in improving her back pain.   GOALS ADDRESSED: Patient will: 1.  Reduce symptoms of: anxiety  INTERVENTIONS: Interventions utilized:  Brief CBT and Supportive Counseling  ASSESSMENT: Patient currently experiencing heightened anxiety, back pain, struggles with asking for help, conflict with husband, tearfulness, doubts about her sanity. Counselor guided patient and husband in sharing their concerns at this time. Patient explained that because her body is in pain, it feels as though nothing is right and she has a greater need to clean. This is difficulty because the pain leaves her with a reduced ability to clean. Husband reports that patient walks into the room and immediately focuses on any tiny mess or disorderliness that is present. He  often denies that there is any mess there, which starts an argument between the two of them. Counselor processed with patient and husband their thoughts and feelings about these situations. Husband indicates that patient focusing immediately on the mess rather than interacting with him at all makes him feel overlooked and unimportant. He acknowledges that he sometimes says there is no mess when clearly there is, and he does so to make light of the situation. Patient reports that this makes her feel as though husband thinks she is crazy and "takes [her] to another level, another planet" regarding her anger at him. Counselor guided patient and husband individually in functional analysis of their role in the situation. Husband reports that he is trying to use humor and minimization to make light of a situation that he knows is heavy and upsetting to patient. Counselor explained to husband that the cleanliness is not just a point of pride to patient, but represents her power over her own body and wellbeing. Patient expressed that being told there is no mess when she can clearly see one reminds her of her mother denying it when she tried to point out traumas and problems in her past. Husband stated that he understands that, but he is not patient's mother and so he is not doing that. Counselor reflected back to husband that he is trying to make the problem seem smaller and easier for patient to handle, but instead it is making her feel overlooked and unheard, which makes her feel powerless. Counselor guided patient and husband in discussion of non-negotiables and points for compromise.   Patient may benefit from continued weekly trauma informed CBT.  PLAN: 1. Follow up with behavioral health clinician on : 04/07/18 @  [address], [place]

## 2018-04-07 ENCOUNTER — Ambulatory Visit: Payer: Self-pay | Admitting: Licensed Clinical Social Worker

## 2018-04-12 ENCOUNTER — Other Ambulatory Visit: Payer: Self-pay | Admitting: Behavioral Health

## 2018-04-12 ENCOUNTER — Other Ambulatory Visit: Payer: Self-pay | Admitting: Internal Medicine

## 2018-04-14 ENCOUNTER — Ambulatory Visit (INDEPENDENT_AMBULATORY_CARE_PROVIDER_SITE_OTHER): Payer: Medicaid Other | Admitting: Licensed Clinical Social Worker

## 2018-04-14 DIAGNOSIS — F4312 Post-traumatic stress disorder, chronic: Secondary | ICD-10-CM | POA: Diagnosis not present
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
## 2018-04-15 ENCOUNTER — Telehealth: Payer: Self-pay

## 2018-04-15 NOTE — Telephone Encounter (Signed)
Patient walked into clinic today to drop off clearance forms needed for Dr. Patrecia Pace at [facility]. Patient would like forms faxed over to [phone] once they are complete by Dr. Orvan Falconer. Will leave form in Dr. Blair Dolphin box and a copy in triage.  Lorenso Courier, [place]

## 2018-04-21 ENCOUNTER — Ambulatory Visit: Payer: Self-pay | Admitting: Licensed Clinical Social Worker

## 2018-04-28 ENCOUNTER — Ambulatory Visit (INDEPENDENT_AMBULATORY_CARE_PROVIDER_SITE_OTHER): Payer: Medicaid Other | Admitting: Licensed Clinical Social Worker

## 2018-04-28 DIAGNOSIS — F4312 Post-traumatic stress disorder, chronic: Secondary | ICD-10-CM

## 2018-04-28 NOTE — BH Specialist Note (Signed)
Integrated Behavioral Health Follow Up Visit  MRN: [id number] Name: Tracy [name]  Session Start time: 10:08am Session End time: 11:12 am Total time: 1 hour  Type of Service: Integrated Behavioral Health- Individual/Family Interpretor:No. Interpretor Name and Language: n/a  SUBJECTIVE: Tracy [name] is a 52 y.o. female accompanied by Spouse Patient reports the following symptoms/concerns: feeling anxious and unsafe because house is disorganized, paranoia, hyperarousal, exaggerated startle, obsessive thoughts   OBJECTIVE: Mood: Anxious and Affect: Labile Risk of harm to self or others: No plan to harm self or others  LIFE CONTEXT: Patient and husband are packing up to move in with her daughter's family next week. They will stay with daughter temporarily until they find their own apartment or house. Patient reports that they are moving because they cannot afford the rent anymore. She is concerned that the situation with daughter will not go well because husband does not sleep at night and may wake up the grandchildren. Patient states that she has been to dr and they have recommended back surgery. Currently they are waiting on Medicaid for prior approval. She reports that the pain is constant and awful.  GOALS ADDRESSED: Patient will: 1.  Reduce symptoms of: anxiety    INTERVENTIONS: Interventions utilized:  Brief CBT, Supportive Counseling and Psychoeducation and/or Health Education   ASSESSMENT: Patient currently experiencing anxiety, hyperarousal, intrusive/obsessive thoughts, paranoia. She reports that having the majority of her house packed in boxes and things not being in their place is very stressful to her. Counselor processed this with patient. Patient states that the disorganization makes her feel unsafe and though everything is dirty. Counselor and patient explored the relationship between patient's home and her feelings of personal safety and control. Patient discussed her  paranoid feelings, which she has had more often lately, and expressed a desire to "get rid of" them. Counselor explained that these thoughts may be related to trauma response or may be organic, and the treatment would be somewhat different based on this.  Patient and counselor explored patient's thoughts during a paranoid episode, versus afterward when she has insight. Counselor emphasized the ways in which patient's background and trauma have taught her not to trust herself or positive happenings. Patient worked with counselor on identifying some of the thoughts that support this, and agreed to look for evidence to combat these thoughts. Counselor educated patient how trauma creates new paths in the brain, and explained how the survival response becomes active in a way that is incongruent with the threat at hand. Patient agreed to work with counselor on addressing her trauma response, and on mapping out a program to do so.   Patient may benefit from continued weekly trauma-informed CBT sessions.  PLAN: 1. Follow up with behavioral health clinician on : 05/05/18   Angus Palms, LCSW

## 2018-05-05 ENCOUNTER — Institutional Professional Consult (permissible substitution): Payer: Self-pay | Admitting: Licensed Clinical Social Worker

## 2018-05-12 ENCOUNTER — Institutional Professional Consult (permissible substitution): Payer: Self-pay | Admitting: Licensed Clinical Social Worker

## 2018-05-19 ENCOUNTER — Ambulatory Visit (INDEPENDENT_AMBULATORY_CARE_PROVIDER_SITE_OTHER): Payer: Medicaid Other | Admitting: Licensed Clinical Social Worker

## 2018-05-19 DIAGNOSIS — F4312 Post-traumatic stress disorder, chronic: Secondary | ICD-10-CM

## 2018-05-19 NOTE — BH Specialist Note (Signed)
Integrated Behavioral Health Follow Up Visit  MRN: [id number] Name: Tracy [name]  Session Start time: 10:12am  Session End time: 10:46am Total time: 30 minutes  Type of Service: Integrated Behavioral Health- Individual/Family Interpretor:No. Interpretor Name and Language: n/a  SUBJECTIVE: Tracy [name] is a 52 y.o. female accompanied by Spouse Patient reports the following symptoms/concerns: anxiety, tired of trying, crying spells, re-experienicing, paranoia, feeling overwhelmed.   OBJECTIVE: Mood: Anxious and Affect: Tearful Risk of harm to self or others: No plan to harm self or others  LIFE CONTEXT: Patient and husband are currently living with her daughter, son-in-law and grandchildren, and states that she has no time to herself. She reports feeling overwhelmed and as though everything is a fight in every area of her life, including relationship with her husband. Patient states that her daughter is moving out of the place they are currently staying in February, and will give it to patient and husband. However, at the moment she does not know whether she wants it.   GOALS ADDRESSED: Patient will: 1.  Reduce symptoms of: anxiety   INTERVENTIONS: Interventions utilized:  Brief CBT and Supportive Counseling  ASSESSMENT: Patient currently experiencing anxiety, tired of trying, crying spells, re-experienicing, paranoia, feeling overwhelmed. She reports that she has been fighting all her life and is tired of fighting. Counselor guided patient to clarify this statement. Patient struggled with this, saying she does not know what it would look like to not fight, because she has always done so, but that she is about to find out. Counselor challenged patient regarding whether this means she is going to stop taking care of herself, stop eating, stop living, etc. Patient addended her statement, reporting that she is going to stop fighting for anything other than herself. Counselor commended  patient for making herself a priority, and explored with her what this might look like. Counselor educated patient on hierarchy of needs and provided her with an infographic about various categories of needs. Patient and counselor explored ways that patient can use this to identify her own needs, and from there identify what she needs to "fight for" within herself.   Patient may benefit from continued weekly sessions of CBT and Reality Therapy.  PLAN: 1. Follow up with behavioral health clinician on : 05/25/18 @ 10:30am  Angus Palms, LCSW

## 2018-05-25 ENCOUNTER — Institutional Professional Consult (permissible substitution): Payer: Medicaid Other | Admitting: Licensed Clinical Social Worker

## 2018-05-26 ENCOUNTER — Institutional Professional Consult (permissible substitution): Payer: Self-pay | Admitting: Licensed Clinical Social Worker

## 2018-05-28 ENCOUNTER — Emergency Department (HOSPITAL_COMMUNITY)
Admission: EM | Admit: 2018-05-28 | Discharge: 2018-05-28 | Disposition: A | Discharge status: Home / Self Care | Payer: Medicaid Other | Attending: Emergency Medicine | Admitting: Emergency Medicine

## 2018-05-28 ENCOUNTER — Other Ambulatory Visit: Payer: Self-pay

## 2018-05-28 ENCOUNTER — Encounter (HOSPITAL_COMMUNITY): Payer: Self-pay | Admitting: *Deleted

## 2018-05-28 ENCOUNTER — Emergency Department (HOSPITAL_COMMUNITY): Payer: Medicaid Other

## 2018-05-28 DIAGNOSIS — M25531 Pain in right wrist: Secondary | ICD-10-CM | POA: Insufficient documentation

## 2018-05-28 DIAGNOSIS — F17210 Nicotine dependence, cigarettes, uncomplicated: Secondary | ICD-10-CM | POA: Insufficient documentation

## 2018-05-28 DIAGNOSIS — Z79899 Other long term (current) drug therapy: Secondary | ICD-10-CM | POA: Insufficient documentation

## 2018-05-28 MED ORDER — HYDROCODONE-ACETAMINOPHEN 5-325 MG PO TABS: 1.0000 | ORAL_TABLET | Freq: Three times a day (TID) | ORAL | 0 refills | Status: DC | PRN

## 2018-05-28 MED ORDER — ACETAMINOPHEN 500 MG PO TABS: 1000.0000 mg | ORAL_TABLET | Freq: Three times a day (TID) | ORAL | 0 refills | Status: AC

## 2018-05-28 MED ORDER — ACETAMINOPHEN 500 MG PO TABS
1000.0000 mg | ORAL_TABLET | Freq: Once | ORAL | Status: AC
  Administered 2018-05-28: 1000 mg via ORAL
  Filled 2018-05-28: qty 2

## 2018-05-28 NOTE — ED Provider Notes (Addendum)
[facility] EMERGENCY DEPARTMENT Provider Note  CSN: [id number] Arrival date & time: 05/28/18 0310  Chief Complaint(s) Fall  HPI Tracy [name] is a 51 y.o. female with a history of chronic lower back pain who presents to the emergency department with right wrist pain following a mechanical fall.  Patient reports while standing up she lost her balance due to her chronic back pain causing her to fall.  She try to catch herself with her right hand.  This occurred 2 hours prior to arrival.  Right wrist pain exacerbated with palpation range of motion of the thumb.  Alleviated by mobility.  Denies any head trauma, neck pain, new or exacerbation of old back pain.  HPI  Past Medical History Past Medical History:  Diagnosis Date  . Chronic back pain   . Chronic headaches   . Depression   . HIV infection (HCC)   . Schizoaffective disorder (HCC)   . Stroke (cerebrum) [facility])    Patient Active Problem List   Diagnosis Date Noted  . Conversion disorder   . Cervical radiculopathy   . Chronic headaches 12/29/2016  . Breast pain, left 05/25/2016  . Dry skin dermatitis 02/10/2016  . Itching due to drug 12/26/2015  . HIV disease (HCC) 10/03/2015  . Schizoaffective disorder (HCC) 10/03/2015  . Chronic pain syndrome 10/03/2015  . Cigarette smoker 10/03/2015   Home Medication(s) Prior to Admission medications   Medication Sig Start Date End Date Taking? Authorizing Provider  acetaminophen (TYLENOL) 500 MG tablet Take 2 tablets (1,000 mg total) by mouth every 8 (eight) hours for 5 days. Do not take more than 4000 mg of acetaminophen (Tylenol) in a 24-hour period. Please note that other medicines that you may be prescribed may have Tylenol as well. 05/28/18 06/02/18  Lazarus Sudbury, Amadeo Garnet, MD  buPROPion (WELLBUTRIN XL) 300 MG 24 hr tablet Take 1 tablet (300 mg total) by mouth daily. 10/03/15   Cliffton Asters, MD  COMPLERA 200-25-300 MG tablet TAKE ONE TABLET BY MOUTH ONCE DAILY  04/12/18   Cliffton Asters, MD  gabapentin (NEURONTIN) 300 MG capsule Take 300 mg by mouth 3 (three) times daily.    [provider]  Lido-Capsaicin-Men-Methyl Sal (1ST MEDX-PATCH/ LIDOCAINE) 4-0.373-11-29 % PTCH Apply 1 patch topically daily. 02/05/17   McDonald, Mia A, PA-C  loratadine (CLARITIN) 10 MG tablet Take 10 mg by mouth daily.    [provider]  Oxycodone HCl 10 MG TABS Take 10 mg by mouth 3 (three) times daily as needed (pain).     [provider]  PROAIR HFA 108 [phone] Base) MCG/ACT inhaler Inhale 2 inhalations into the lungs every 6 (six) hours as needed for Wheezing or Shortness of Breath 06/16/17   [provider]  QUEtiapine (SEROQUEL XR) 400 MG 24 hr tablet Take 1 tablet (400 mg total) by mouth at bedtime. 10/03/15   Cliffton Asters, MD  SUMAtriptan (IMITREX) 100 MG tablet Take 100 mg by mouth every 2 (two) hours as needed for migraine. May repeat in 2 hours if headache persists or recurs.    [provider]  TIVICAY 50 MG tablet TAKE ONE TABLET BY MOUTH DAILY 04/12/18   Cliffton Asters, MD  topiramate (TOPAMAX) 100 MG tablet Take 1 tablet (100 mg total) by mouth daily. 10/03/15   Cliffton Asters, MD  Past Surgical History Past Surgical History:  Procedure Laterality Date  . ABDOMINAL HYSTERECTOMY    . BREAST EXCISIONAL BIOPSY Right 1990's  . BREAST SURGERY     Family History Family History  Problem Relation Age of Onset  . Diabetes Mother   . Hypertension Mother   . Tremor Mother   . Breast cancer Maternal Aunt   . High blood pressure Sister   . High blood pressure Maternal Uncle   . Diabetes Maternal Uncle     Social History Social History   Tobacco Use  . Smoking status: Current Some Day Smoker    Packs/day: 0.30    Types: Cigarettes    Last attempt to quit: 04/26/2016    Years since quitting: 2.0    . Smokeless tobacco: Never Used  . Tobacco comment: cutting back 1-2 per wk  Substance Use Topics  . Alcohol use: No    Alcohol/week: 0.0 standard drinks    Comment: quit 12/2005  . Drug use: No    Comment: Previous drug use, quit 2004-05   Allergies Tetracyclines & related; Tramadol; Flexeril [cyclobenzaprine]; and Moxifloxacin  Review of Systems Review of Systems As noted in HPI Physical Exam Vital Signs  I have reviewed the triage vital signs BP (!) 138/99   Pulse (!) 114   Temp 97.9 F (36.6 C) (Oral)   Resp 16   SpO2 97%   Physical Exam  Constitutional: She is oriented to person, place, and time. She appears well-developed and well-nourished. No distress.  HENT:  Head: Normocephalic and atraumatic.  Right Ear: External ear normal.  Left Ear: External ear normal.  Nose: Nose normal.  Eyes: Conjunctivae and EOM are normal. No scleral icterus.  Neck: Normal range of motion and phonation normal.  Cardiovascular: Normal rate and regular rhythm.  Pulmonary/Chest: Effort normal. No stridor. No respiratory distress.  Abdominal: She exhibits no distension.  Musculoskeletal: She exhibits no edema.       Right wrist: She exhibits decreased range of motion, tenderness, bony tenderness and swelling.  Snuffbox tenderness noted  Neurological: She is alert and oriented to person, place, and time.  Skin: She is not diaphoretic.  Psychiatric: She has a normal mood and affect. Her behavior is normal.  Vitals reviewed.   ED Results and Treatments Labs (all labs ordered are listed, but only abnormal results are displayed) Labs Reviewed - No data to display                                                                                                                       EKG  EKG Interpretation  Date/Time:    Ventricular Rate:    PR Interval:    QRS Duration:   QT Interval:    QTC Calculation:   R Axis:     Text Interpretation:        Radiology Dg Wrist Complete  Right  Result Date: 05/28/2018 CLINICAL DATA:  Status post fall, with right wrist pain. Initial encounter. EXAM:  RIGHT WRIST - COMPLETE 3+ VIEW COMPARISON:  None. FINDINGS: There is no evidence of fracture or dislocation. The carpal rows are intact, and demonstrate normal alignment. The joint spaces are preserved. No significant soft tissue abnormalities are seen. IMPRESSION: No evidence of fracture or dislocation. Electronically Signed   By: Roanna RaiderJeffery  Chang M.D.   On: 05/28/2018 04:20   Pertinent labs & imaging results that were available during my care of the patient were reviewed by me and considered in my medical decision making (see chart for details).  Medications Ordered in ED Medications  acetaminophen (TYLENOL) tablet 1,000 mg (1,000 mg Oral Given 05/28/18 0418)                                                                                                                                    Procedures Procedures  (including critical care time)  Medical Decision Making / ED Course I have reviewed the nursing notes for this encounter and the patient's prior records (if available in EHR or on provided paperwork).    Mechanical fall resulting in right wrist pain.  Patient with snuffbox tenderness.  Plain film negative for any obvious fractures.  Given his snuffbox tenderness concern for occult scaphoid fracture.  Additionally possible wrist sprain.  Provided with oral pain medicine.  Thumb spica applied  We will have patient follow-up with hand surgery for repeat imaging.  Narcotic database reviewed and no active prescriptions noted. But patient states is on Oxy at home.  Final Clinical Impression(s) / ED Diagnoses Final diagnoses:  Right wrist pain    Disposition: Discharge  Condition: Good  I have discussed the results, Dx and Tx plan with the patient who expressed understanding and agree(s) with the plan. Discharge instructions discussed at great length. The patient was  given strict return precautions who verbalized understanding of the instructions. No further questions at time of discharge.    ED Discharge Orders         Ordered    acetaminophen (TYLENOL) 500 MG tablet  Every 8 hours     05/28/18 0433    HYDROcodone-acetaminophen (NORCO/VICODIN) 5-325 MG tablet  Every 8 hours PRN,   Status:  Discontinued     05/28/18 0433           Follow Up: Knute Neuoley, [place], MD [address] [place] [address] [phone]  Schedule an appointment as soon as possible for a visit  in 1-2 weeks, For close follow up to assess for wrist pain/possible scaphoid fracture  Shawn Stallodd, Dana L, MD [address] [place] [id number] [phone]  Schedule an appointment as soon as possible for a visit  As needed for continued pain control     This chart was dictated using voice recognition software.  Despite best efforts to proofread,  errors can occur which can change the documentation meaning.   Nira Connardama, Petros Ahart Eduardo, MD 05/28/18 [phone]  Nira Conn, MD 05/28/18 [phone]

## 2018-05-28 NOTE — ED Triage Notes (Signed)
Pt slipped and fell in her kitchen this morning, denies LOC or hitting head, braced herself using the R wrist. C/o pain to lower back and R wrist and R index finger

## 2018-06-01 ENCOUNTER — Ambulatory Visit (INDEPENDENT_AMBULATORY_CARE_PROVIDER_SITE_OTHER): Payer: Medicaid Other | Admitting: Licensed Clinical Social Worker

## 2018-06-01 DIAGNOSIS — F4312 Post-traumatic stress disorder, chronic: Secondary | ICD-10-CM

## 2018-06-01 NOTE — Progress Notes (Signed)
Integrated Behavioral Health Follow Up Visit  MRN: [id number] Name: Tracy [name]  Session Start time: 10:45am  Session End time: 11:18am Total time: 30 minutes  Type of Service: [place] Interpretor:No. Interpretor Name and Language: n/a  SUBJECTIVE: Tracy [name] is a 52 y.o. female accompanied by self Patient reports the following symptoms/concerns: anxiety, irritability, desire to isolate  OBJECTIVE: Mood: Euthymic and Affect: Labile Risk of harm to self or others: No plan to harm self or others  LIFE CONTEXT: Patient presents with much less anxiety than at last session. She reports that she is adapting to living with her daughter's family, and that she has been focusing on finding her happiness lately. Patient shares that she has had some moments of conflict with husband, but it no longer bothers her as much as it did because she realizes that she is doing what she needs to to make herself as healthy as possible and that sometimes there will be conflict around that, but her own happiness is more important than keeping the peace.   GOALS ADDRESSED: Patient will: 1.  Reduce symptoms of: anxiety  INTERVENTIONS: Interventions utilized:  Brief CBT  ASSESSMENT: Patient currently experiencing anxiety, irritability (especially toward husband) and desire to spend her time alone. She reports that she has been feeling a bit better lately, as she woke up one day and decided she needed to be making herself happy. That day, patient reports that she went to [employer] and asked for books on happiness. She has begun reading the self-help book [facility], and has found that before she can be happy in life on a large scale, she needs to be happy with herself. Counselor pointed out that patient's happiness has rarely been made a priority, and commended her for deciding to do this for herself. Counselor guided patient to discuss how she is becoming  happy with herself. Patient talked about meeting her own needs. One day last week she took the day just for herself and went for a walk in the park then to the mall. Her husband called her midday and asked her to come home and help with the grandchildren. Patient reports she was very angry and got into an argument with him, but later felt guilty about it. Counselor and patient discussed patient's emotional and behavioral response. Counselor validated patient's anger and frustration at finally deciding to do something for herself and having it cut short to do something for others. Patient and counselor explored ways that patient can respond to these feelings differently in the future. Counselor emphasized the importance of discernment in happiness, and explored with patient ways of telling when feelings of guilt or doubt are founded in who she is or whether they are situational. Counselor guided patient in defining what happy may look like for her, and emphasized that happiness with other people may look different based on that relationship. Patient and counselor explored ways that patient can set boundaries and expectations with various people in her life in order to have her needs met.    Patient may benefit from continued weekly sessions of CBT.  PLAN: 1. Follow up with behavioral health clinician on : 06/15/18 _0    Lillie Fragmin, LCSW

## 2018-06-02 ENCOUNTER — Institutional Professional Consult (permissible substitution): Payer: Self-pay | Admitting: Licensed Clinical Social Worker

## 2018-06-08 ENCOUNTER — Institutional Professional Consult (permissible substitution): Payer: Medicaid Other | Admitting: Licensed Clinical Social Worker

## 2018-06-16 ENCOUNTER — Other Ambulatory Visit: Payer: Self-pay | Admitting: *Deleted

## 2018-06-16 ENCOUNTER — Institutional Professional Consult (permissible substitution): Payer: Self-pay | Admitting: Licensed Clinical Social Worker

## 2018-06-17 ENCOUNTER — Other Ambulatory Visit: Payer: Self-pay

## 2018-06-17 ENCOUNTER — Telehealth: Payer: Self-pay | Admitting: Surgery

## 2018-06-17 NOTE — Telephone Encounter (Signed)
sch appt lvm 07/18/2018 1pm ALIF f/u MD

## 2018-06-17 NOTE — Telephone Encounter (Signed)
-----   Message from Retta Macebecca J Roberts, RN sent at 06/15/2018  4:39 PM EST ----- Regarding: OFFICE APPOINTMENT PLEASE SCHEDULE OFFICE APPOINTMENT WITH DR. Myra GianottiBRABHAM ALIF SCHEDULED FOR 07/27/18 WITH DR. Shon BatonBROOKS PATIENT HAS BEEN INSTRUCTED TO BRING ALL FILMS ETC. WITH HER TO THIS APPOINTMENT.

## 2018-06-20 ENCOUNTER — Telehealth: Payer: Self-pay | Admitting: Licensed Clinical Social Worker

## 2018-06-20 ENCOUNTER — Encounter (HOSPITAL_COMMUNITY): Payer: Self-pay

## 2018-06-20 ENCOUNTER — Inpatient Hospital Stay (HOSPITAL_COMMUNITY)
Admission: RE | Admit: 2018-06-20 | Discharge: 2018-06-24 | DRG: 885 | Disposition: A | Discharge status: Home / Self Care | Payer: Medicaid Other | Attending: Psychiatry | Admitting: Psychiatry

## 2018-06-20 ENCOUNTER — Emergency Department (HOSPITAL_COMMUNITY)
Admission: EM | Admit: 2018-06-20 | Discharge: 2018-06-20 | Disposition: A | Discharge status: Other Acute Inpatient Hospital | Payer: Medicaid Other | Attending: Emergency Medicine | Admitting: Emergency Medicine

## 2018-06-20 ENCOUNTER — Ambulatory Visit (HOSPITAL_COMMUNITY): Payer: Self-pay | Admitting: Orthopedic Surgery

## 2018-06-20 ENCOUNTER — Other Ambulatory Visit: Payer: Self-pay

## 2018-06-20 ENCOUNTER — Ambulatory Visit (INDEPENDENT_AMBULATORY_CARE_PROVIDER_SITE_OTHER): Payer: Medicaid Other | Admitting: Licensed Clinical Social Worker

## 2018-06-20 DIAGNOSIS — Z79899 Other long term (current) drug therapy: Secondary | ICD-10-CM | POA: Diagnosis not present

## 2018-06-20 DIAGNOSIS — E119 Type 2 diabetes mellitus without complications: Secondary | ICD-10-CM | POA: Diagnosis present

## 2018-06-20 DIAGNOSIS — F600 Paranoid personality disorder: Secondary | ICD-10-CM | POA: Diagnosis present

## 2018-06-20 DIAGNOSIS — F250 Schizoaffective disorder, bipolar type: Secondary | ICD-10-CM

## 2018-06-20 DIAGNOSIS — G4700 Insomnia, unspecified: Secondary | ICD-10-CM | POA: Diagnosis present

## 2018-06-20 DIAGNOSIS — F329 Major depressive disorder, single episode, unspecified: Secondary | ICD-10-CM | POA: Diagnosis present

## 2018-06-20 DIAGNOSIS — F209 Schizophrenia, unspecified: Secondary | ICD-10-CM | POA: Diagnosis not present

## 2018-06-20 DIAGNOSIS — Z8673 Personal history of transient ischemic attack (TIA), and cerebral infarction without residual deficits: Secondary | ICD-10-CM | POA: Diagnosis not present

## 2018-06-20 DIAGNOSIS — Z21 Asymptomatic human immunodeficiency virus [HIV] infection status: Secondary | ICD-10-CM | POA: Diagnosis present

## 2018-06-20 DIAGNOSIS — Z87891 Personal history of nicotine dependence: Secondary | ICD-10-CM | POA: Diagnosis not present

## 2018-06-20 DIAGNOSIS — B20 Human immunodeficiency virus [HIV] disease: Secondary | ICD-10-CM | POA: Diagnosis not present

## 2018-06-20 DIAGNOSIS — Z7951 Long term (current) use of inhaled steroids: Secondary | ICD-10-CM

## 2018-06-20 DIAGNOSIS — G3184 Mild cognitive impairment, so stated: Secondary | ICD-10-CM | POA: Diagnosis present

## 2018-06-20 DIAGNOSIS — R45851 Suicidal ideations: Secondary | ICD-10-CM | POA: Diagnosis present

## 2018-06-20 DIAGNOSIS — F259 Schizoaffective disorder, unspecified: Secondary | ICD-10-CM | POA: Diagnosis present

## 2018-06-20 DIAGNOSIS — J449 Chronic obstructive pulmonary disease, unspecified: Secondary | ICD-10-CM | POA: Diagnosis present

## 2018-06-20 DIAGNOSIS — F23 Brief psychotic disorder: Secondary | ICD-10-CM | POA: Diagnosis present

## 2018-06-20 DIAGNOSIS — F17210 Nicotine dependence, cigarettes, uncomplicated: Secondary | ICD-10-CM | POA: Insufficient documentation

## 2018-06-20 DIAGNOSIS — F1420 Cocaine dependence, uncomplicated: Secondary | ICD-10-CM | POA: Insufficient documentation

## 2018-06-20 DIAGNOSIS — F419 Anxiety disorder, unspecified: Secondary | ICD-10-CM | POA: Diagnosis present

## 2018-06-20 DIAGNOSIS — F251 Schizoaffective disorder, depressive type: Secondary | ICD-10-CM | POA: Insufficient documentation

## 2018-06-20 DIAGNOSIS — Z888 Allergy status to other drugs, medicaments and biological substances status: Secondary | ICD-10-CM | POA: Diagnosis not present

## 2018-06-20 LAB — CBC WITH DIFFERENTIAL/PLATELET
Abs Immature Granulocytes: 0.04 10*3/uL (ref 0.00–0.07)
Basophils Absolute: 0 10*3/uL (ref 0.0–0.1)
Basophils Relative: 1 %
Eosinophils Absolute: 0 10*3/uL (ref 0.0–0.5)
Eosinophils Relative: 0 %
HCT: 43.3 % (ref 36.0–46.0)
Hemoglobin: 13.8 g/dL (ref 12.0–15.0)
Immature Granulocytes: 1 %
LYMPHS PCT: 24 %
Lymphs Abs: 1.9 10*3/uL (ref 0.7–4.0)
MCH: 30.8 pg (ref 26.0–34.0)
MCHC: 31.9 g/dL (ref 30.0–36.0)
MCV: 96.7 fL (ref 80.0–100.0)
Monocytes Absolute: 0.7 10*3/uL (ref 0.1–1.0)
Monocytes Relative: 9 %
Neutro Abs: 5.2 10*3/uL (ref 1.7–7.7)
Neutrophils Relative %: 65 %
Platelets: 324 10*3/uL (ref 150–400)
RBC: 4.48 MIL/uL (ref 3.87–5.11)
RDW: 13.6 % (ref 11.5–15.5)
WBC: 7.9 10*3/uL (ref 4.0–10.5)
nRBC: 0 % (ref 0.0–0.2)

## 2018-06-20 LAB — COMPREHENSIVE METABOLIC PANEL
ALK PHOS: 60 U/L (ref 38–126)
ALT: 22 U/L (ref 0–44)
AST: 24 U/L (ref 15–41)
Albumin: 4.4 g/dL (ref 3.5–5.0)
Anion gap: 14 (ref 5–15)
BUN: 13 mg/dL (ref 6–20)
CO2: 22 mmol/L (ref 22–32)
Calcium: 9.3 mg/dL (ref 8.9–10.3)
Chloride: 103 mmol/L (ref 98–111)
Creatinine, Ser: 0.96 mg/dL (ref 0.44–1.00)
GFR calc Af Amer: >60 mL/min (ref >60)
GFR calc non Af Amer: >60 mL/min (ref >60)
Glucose, Bld: 82 mg/dL (ref 70–99)
Potassium: 3.6 mmol/L (ref 3.5–5.1)
SODIUM: 139 mmol/L (ref 135–145)
Total Bilirubin: 1.4 mg/dL — ABNORMAL HIGH (ref 0.3–1.2)
Total Protein: 7.2 g/dL (ref 6.5–8.1)

## 2018-06-20 LAB — I-STAT BETA HCG BLOOD, ED (MC, WL, AP ONLY): I-stat hCG, quantitative: <5 m[IU]/mL (ref <5)

## 2018-06-20 LAB — ETHANOL: Alcohol, Ethyl (B): <10 mg/dL (ref <10)

## 2018-06-20 MED ORDER — EMTRICITAB-RILPIVIR-TENOFOV AF 200-25-25 MG PO TABS
1.0000 | ORAL_TABLET | Freq: Every day | ORAL | Status: DC
  Filled 2018-06-20: qty 1

## 2018-06-20 MED ORDER — MAGNESIUM HYDROXIDE 400 MG/5ML PO SUSP: 30.0000 mL | Freq: Every day | ORAL | Status: DC | PRN

## 2018-06-20 MED ORDER — QUETIAPINE FUMARATE ER 400 MG PO TB24
400.0000 mg | ORAL_TABLET | Freq: Every day | ORAL | Status: DC
  Filled 2018-06-20: qty 1

## 2018-06-20 MED ORDER — GABAPENTIN 300 MG PO CAPS
300.0000 mg | ORAL_CAPSULE | Freq: Three times a day (TID) | ORAL | Status: DC
  Administered 2018-06-21 – 2018-06-24 (×10): 300 mg via ORAL
  Filled 2018-06-20 (×18): qty 1

## 2018-06-20 MED ORDER — ACETAMINOPHEN 325 MG PO TABS: 650.0000 mg | ORAL_TABLET | Freq: Four times a day (QID) | ORAL | Status: DC | PRN

## 2018-06-20 MED ORDER — ALBUTEROL SULFATE HFA 108 (90 BASE) MCG/ACT IN AERS: 2.0000 | INHALATION_SPRAY | Freq: Four times a day (QID) | RESPIRATORY_TRACT | Status: DC | PRN

## 2018-06-20 MED ORDER — EMTRICITAB-RILPIVIR-TENOFOV AF 200-25-25 MG PO TABS
1.0000 | ORAL_TABLET | Freq: Every day | ORAL | Status: DC
  Administered 2018-06-21 – 2018-06-24 (×4): 1 via ORAL
  Filled 2018-06-20 (×6): qty 1

## 2018-06-20 MED ORDER — TENOFOVIR DISOPROXIL FUMARATE 300 MG PO TABS: 300.0000 mg | ORAL_TABLET | Freq: Every day | ORAL | Status: DC

## 2018-06-20 MED ORDER — DOLUTEGRAVIR SODIUM 50 MG PO TABS
50.0000 mg | ORAL_TABLET | Freq: Every day | ORAL | Status: DC
  Administered 2018-06-21 – 2018-06-24 (×4): 50 mg via ORAL
  Filled 2018-06-20 (×6): qty 1

## 2018-06-20 MED ORDER — BUPROPION HCL ER (XL) 300 MG PO TB24
300.0000 mg | ORAL_TABLET | Freq: Every day | ORAL | Status: DC
  Administered 2018-06-21 – 2018-06-22 (×2): 300 mg via ORAL
  Filled 2018-06-20 (×3): qty 1

## 2018-06-20 MED ORDER — GABAPENTIN 300 MG PO CAPS
300.0000 mg | ORAL_CAPSULE | Freq: Three times a day (TID) | ORAL | Status: DC
  Administered 2018-06-20: 300 mg via ORAL
  Filled 2018-06-20 (×2): qty 1

## 2018-06-20 MED ORDER — DOLUTEGRAVIR SODIUM 50 MG PO TABS
50.0000 mg | ORAL_TABLET | Freq: Every day | ORAL | Status: DC
  Administered 2018-06-20: 50 mg via ORAL
  Filled 2018-06-20: qty 1

## 2018-06-20 MED ORDER — EMTRICITABINE-TENOFOVIR AF 200-25 MG PO TABS: 1.0000 | ORAL_TABLET | Freq: Every day | ORAL | Status: DC

## 2018-06-20 MED ORDER — TOPIRAMATE 100 MG PO TABS
100.0000 mg | ORAL_TABLET | Freq: Every day | ORAL | Status: DC
  Administered 2018-06-21 – 2018-06-22 (×2): 100 mg via ORAL
  Filled 2018-06-20 (×3): qty 1

## 2018-06-20 MED ORDER — HYDROXYZINE HCL 25 MG PO TABS
25.0000 mg | ORAL_TABLET | Freq: Three times a day (TID) | ORAL | Status: DC | PRN
  Administered 2018-06-21 – 2018-06-23 (×5): 25 mg via ORAL
  Filled 2018-06-20 (×3): qty 1

## 2018-06-20 MED ORDER — QUETIAPINE FUMARATE ER 400 MG PO TB24
400.0000 mg | ORAL_TABLET | Freq: Every day | ORAL | Status: DC
  Administered 2018-06-20: 400 mg via ORAL
  Filled 2018-06-20 (×2): qty 1

## 2018-06-20 MED ORDER — BUPROPION HCL ER (XL) 150 MG PO TB24
300.0000 mg | ORAL_TABLET | Freq: Every day | ORAL | Status: DC
  Administered 2018-06-20: 300 mg via ORAL
  Filled 2018-06-20: qty 2

## 2018-06-20 MED ORDER — ALUM & MAG HYDROXIDE-SIMETH 200-200-20 MG/5ML PO SUSP: 30.0000 mL | ORAL | Status: DC | PRN

## 2018-06-20 MED ORDER — TOPIRAMATE 25 MG PO TABS
100.0000 mg | ORAL_TABLET | Freq: Every day | ORAL | Status: DC
  Administered 2018-06-20: 100 mg via ORAL
  Filled 2018-06-20: qty 4

## 2018-06-20 MED ORDER — QUETIAPINE FUMARATE ER 200 MG PO TB24
ORAL_TABLET | ORAL | Status: AC
  Filled 2018-06-20: qty 2

## 2018-06-20 NOTE — ED Notes (Signed)
ED Provider at bedside. 

## 2018-06-20 NOTE — Tx Team (Signed)
Initial Treatment Plan 06/20/2018 11:56 PM Tracy KaufmannYavohnda [name] ONG:[id number]    PATIENT STRESSORS: Financial difficulties Health problems Medication change or noncompliance   PATIENT STRENGTHS: Ability for insight Active sense of humor General fund of knowledge Motivation for treatment/growth Supportive family/friends   PATIENT IDENTIFIED PROBLEMS: psychosis  Risk for suicide  sadness  "learn to say no, start taking care of myself and stop putting other people first"   back problems             DISCHARGE CRITERIA:  Improved stabilization in mood, thinking, and/or behavior Verbal commitment to aftercare and medication compliance  PRELIMINARY DISCHARGE PLAN: Attend aftercare/continuing care group Outpatient therapy  PATIENT/FAMILY INVOLVEMENT: This treatment plan has been presented to and reviewed with the patient, Tracy KaufmannYavohnda [name].  The patient and family have been given the opportunity to ask questions and make suggestions.  Delos HaringPhillips, Chesnee Floren A, RN 06/20/2018, 11:56 PM
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
## 2018-06-20 NOTE — BH Assessment (Signed)
NP Tracy [name] recommends inpatient treatment.  RN Ladona Ridgelaylor and MD Jeraldine LootsLockwood were made aware of the recommendation.  [id number].

## 2018-06-20 NOTE — ED Notes (Signed)
Patient at RN station speaking to Daughter via phone; [facility]

## 2018-06-20 NOTE — ED Notes (Signed)
Pt wanded by security. 

## 2018-06-20 NOTE — ED Notes (Signed)
Called staffing regarding sitter. Left number with staffing who sts they will check and call back.

## 2018-06-20 NOTE — ED Notes (Signed)
Patient signed voluntary consent form; [facility]Monique,[facility]
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
## 2018-06-20 NOTE — ED Triage Notes (Signed)
Pt presents to ED for suicidal thoughts x3 days. Pt states she is having auditory hallucinations, endorses relapse of cocaine use in an attempt to "make the voices stop". Pt states it did not work. Pt states she has not been taking her medication x1 month because "I think someone is poisoning me and putting stuff in my meds", pt states she also thinks someone is poisoning her food. Pt tearful during assessment. Pt endorses significant history of trauma. Pt continues to repeat "I just want it to stop". Pt states the voices say "everything" to her. Pt having difficulty articulating her thoughts.

## 2018-06-20 NOTE — ED Notes (Signed)
All belongings except underwear removed, family to take home all clothing, jewelry and cell phone. Security called to wand patient. Pt in purple scrubs.

## 2018-06-21 DIAGNOSIS — F209 Schizophrenia, unspecified: Secondary | ICD-10-CM

## 2018-06-21 DIAGNOSIS — B20 Human immunodeficiency virus [HIV] disease: Secondary | ICD-10-CM

## 2018-06-21 LAB — URINALYSIS, ROUTINE W REFLEX MICROSCOPIC
Bilirubin Urine: NEGATIVE
Glucose, UA: NEGATIVE mg/dL
HGB URINE DIPSTICK: NEGATIVE
Ketones, ur: NEGATIVE mg/dL
Leukocytes, UA: NEGATIVE
Nitrite: NEGATIVE
Protein, ur: NEGATIVE mg/dL
Specific Gravity, Urine: 1.01 (ref 1.005–1.030)
pH: 8 (ref 5.0–8.0)

## 2018-06-21 LAB — LIPID PANEL
CHOLESTEROL: 188 mg/dL (ref 0–200)
HDL: 76 mg/dL (ref >40)
LDL Cholesterol: 85 mg/dL (ref 0–99)
TRIGLYCERIDES: 137 mg/dL (ref <150)
Total CHOL/HDL Ratio: 2.5 RATIO
VLDL: 27 mg/dL (ref 0–40)

## 2018-06-21 LAB — HEMOGLOBIN A1C
Hgb A1c MFr Bld: 5.2 % (ref 4.8–5.6)
Mean Plasma Glucose: 102.54 mg/dL

## 2018-06-21 LAB — PREGNANCY, URINE: Preg Test, Ur: NEGATIVE

## 2018-06-21 LAB — TSH: TSH: 3.904 u[IU]/mL (ref 0.350–4.500)

## 2018-06-21 MED ORDER — LORAZEPAM 1 MG PO TABS
1.0000 mg | ORAL_TABLET | Freq: Once | ORAL | Status: AC
  Administered 2018-06-21: 1 mg via ORAL
  Filled 2018-06-21: qty 1

## 2018-06-21 MED ORDER — LORAZEPAM 1 MG PO TABS
1.0000 mg | ORAL_TABLET | Freq: Four times a day (QID) | ORAL | Status: DC | PRN
  Administered 2018-06-21 – 2018-06-23 (×5): 1 mg via ORAL
  Filled 2018-06-21 (×5): qty 1

## 2018-06-21 MED ORDER — LORAZEPAM 2 MG/ML IJ SOLN
2.0000 mg | Freq: Four times a day (QID) | INTRAMUSCULAR | Status: DC | PRN
  Administered 2018-06-21: 2 mg via INTRAMUSCULAR
  Filled 2018-06-21: qty 1

## 2018-06-21 MED ORDER — PERPHENAZINE 4 MG PO TABS
4.0000 mg | ORAL_TABLET | Freq: Two times a day (BID) | ORAL | Status: DC
  Administered 2018-06-21 – 2018-06-23 (×4): 4 mg via ORAL
  Filled 2018-06-21 (×9): qty 1
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
## 2018-06-21 NOTE — Plan of Care (Signed)
Problem: Safety: Goal: Periods of time without injury will increase Outcome: Progressing   Problem: Medication: Goal: Compliance with prescribed medication regimen will improve Outcome: Progressing   Problem: Nutritional: Goal: Ability to achieve adequate nutritional intake will improve Outcome: Progressing DAR Note: Pt seen in hall on initial encounter. A & O X3. Denies HI, VH and pain at time of assessment. Pt endorsed SI without plan. Verbally contracts for safety. Pt observed crying in hall holding her both ears accompanied by staff to the nursing station "the voices are too loud, I can't take it, it's too much noise". Pt noted to be paranoid as well;  jumps/ scared reaction when someone walks behind her. Rates her anxiety, depression and hopelessness all 9/10. Reports she slept poorly last night with good appetite, low energy and fair concentration level. Assigned psychiatrist made aware of pt's complaints of auditory hallucination. New order received for Ativan (see EMAR). All medications given with verbal education and effects monitored. Emotional support offered. Encouraged pt to voice concerns, attend to ADLs and comply with current treatment regimen including groups. Pt noted in afternoon group. Showered and change clothing. Tolerates meals and fluids well. Denies concerns at this time. Reports relief from scheduled Ativan "it got a lot better, the last medicine you gave me help". POC continues for safety and mood stability.
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
## 2018-06-21 NOTE — Progress Notes (Signed)
Recreation Therapy Notes  INPATIENT RECREATION THERAPY ASSESSMENT  Patient Details Name: Tracy KaufmannYavohnda [name] MRN: [id number] DOB: 1965/12/26 Today's Date: 06/21/2018       Information Obtained From: Patient  Able to Participate in Assessment/Interview: Yes  Patient Presentation: Alert  Reason for Admission (Per Patient): Med Non-Compliance, Other (Comments)(Hearing voices)  Patient Stressors: Other (Comment)(Thinking someone is trying to kill her)  Coping Skills:   Isolation, Sports, TV, Arguments, Music, Talk, Art, Avoidance, Hot Bath/Shower  Leisure Interests (2+):  Music - Listen, Art - Coloring  Frequency of Recreation/Participation: Other (Comment)(Daily)  Awareness of Community Resources:  No  Expressed Interest in [employer] Corporation[employer] Resource Information: No  County of Residence:  [place]  Patient Main Form of Transportation: Car  Patient Strengths:  Helpful; Always says yes  Patient Identified Areas of Improvement:  Take care of self; Control voices  Patient Goal for Hospitalization:  "Control flashbacks, learn coping skills"  Current SI (including self-harm):  No  Current HI:  No  Current AVH: No  Staff Intervention Plan: Group Attendance, Collaborate with Interdisciplinary Treatment Team  Consent to Intern Participation: N/A    Caroll RancherMarjette Naelle Diegel, LRT/CTRS  Lillia AbedLindsay, Chassity Ludke A 06/21/2018, 1:06 PM
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
## 2018-06-21 NOTE — Plan of Care (Signed)
Patient seen and interviewed full physical as per nurse practitioner patient acknowledging thoughts of self-harm chronic HIV infection and chronic psychosis since teen years and substance abuse issues see admission note.

## 2018-06-21 NOTE — BHH Group Notes (Signed)
LCSW Group Therapy Note 06/21/2018 12:18 PM  Type of Therapy and Topic: Group Therapy: DBT House  Participation Level: Did Not Attend   Description of Group:  In this group patients will be encouraged to explore  their values, behaviors they want to change, emotions they wish to increase, protective factors, supports, coping skills, and motivational factors. They will be guided to discuss their thoughts, feelings, and behaviors related to these obstacles. The group will be asked to individually process the activity and share their insights with the group. This group will be process-oriented, with patients participating in exploration of their own experiences as well as giving and receiving support and challenge from other group members.   Therapeutic Goals: 1. Patient will identify their values 2. Patient will identify behaviors they wish to modify  3. Patient will identify feelings and emotions they wish to increase 4. Patient will identify strengths, supports, protective factors 5. Patient will identify coping skills 6. Patient will identify goals and motivating factors for change   Summary of Patient Progress Patient did not attend group.  Therapeutic Modalities:  Dialectical Behavioral Therapy  Motivational Interviewing Relapse Prevention Therapy

## 2018-06-21 NOTE — Progress Notes (Signed)
Nursing Progress Note: 7p-7a D: Pt currently presents with a anxious/nervous/restless/preoccuppied affect and behavior. Pt states "My day was okay. I think the voice in my head are getting better. It's off and on but they are really bad when I get upset though." Interacting minimally with the milieu. Pt reports poor sleep during the previous night with current medication regimen. Pt did attend wrap-up group.  A: Pt provided with medications per providers orders. Pt's labs and vitals were monitored throughout the night. Pt supported emotionally and encouraged to express concerns and questions. Pt educated on medications.  R: Pt's safety ensured with 15 minute and environmental checks. Pt currently denies SI, HI, and VH and endorses AH. Pt verbally contracts to seek staff if SI,HI, or AVH occurs and to consult with staff before acting on any harmful thoughts. Will continue to monitor.

## 2018-06-21 NOTE — Progress Notes (Signed)
Recreation Therapy Notes  Date: 12.10.19 Time: 1000 Location: 500 Hall Dayroom  Group Topic: Wellness  Goal Area(s) Addresses:  Patient will define components of whole wellness. Patient will verbalize benefit of whole wellness.  Intervention: Music  Activity: Exercise.  LRT and patients went through a series of stretches to loosen up their muscles and relieve some stress from their bodies.  Education: Wellness, Building control surveyorDischarge Planning.   Education Outcome: Acknowledges education/In group clarification offered/Needs additional education.   Clinical Observations/Feedback:  Pt did not attend group.     Caroll RancherMarjette Shayleen Eppinger, LRT/CTRS         Caroll RancherLindsay, Mansa Willers A 06/21/2018 11:19 AM

## 2018-06-22 MED ORDER — TOPIRAMATE 25 MG PO TABS
50.0000 mg | ORAL_TABLET | Freq: Three times a day (TID) | ORAL | Status: DC
  Administered 2018-06-22 – 2018-06-24 (×6): 50 mg via ORAL
  Filled 2018-06-22 (×12): qty 2

## 2018-06-22 NOTE — Progress Notes (Signed)
[facility]  06/22/2018 10:17 AM Tracy KaufmannYavohnda [name]  MRN:  [id number] Subjective:    Patient is seen in her room she has really turned the corner as far as her mood she believes that her mood is stable now she reports voices in her head were called yesterday with medications, and she appreciated the med adjustments.  Further she wants to discontinue the Wellbutrin thinking it may be worsening her condition. This is discussed in team  Is alert and oriented to person place time and situation generally appropriate in conversation reporting no current auditory loose Nations it is important to note that her reports are voices "inside" not outside her head.  No EPS or TD  Principal Problem: Schizophrenia, acute (HCC) Diagnosis: Principal Problem:   Schizophrenia, acute (HCC)  Total Time spent with patient: 20 minutes Past Medical History:  Past Medical History:  Diagnosis Date  . Chronic back pain   . Chronic headaches   . Depression   . HIV infection (HCC)   . Joint pain   . Localized swelling of both lower legs    Legs, feet and hands  . Schizoaffective disorder (HCC)   . Stroke (cerebrum) (HCC)   . Weight loss     Past Surgical History:  Procedure Laterality Date  . ABDOMINAL HYSTERECTOMY    . BREAST EXCISIONAL BIOPSY Right 1990's  . BREAST SURGERY     Family History:  Family History  Problem Relation Age of Onset  . Diabetes Mother   . Hypertension Mother   . Tremor Mother   . Breast cancer Maternal Aunt   . High blood pressure Sister   . High blood pressure Maternal Uncle   . Diabetes Maternal Uncle     Social History:  Social History   Substance and Sexual Activity  Alcohol Use No  . Alcohol/week: 0.0 standard drinks   Comment: quit 12/2005     Social History   Substance and Sexual Activity  Drug Use Yes  . Types: "Crack" cocaine   Comment: Previous drug use, quit 2004-05    Social History   Socioeconomic History  . Marital status: Married   Spouse name: Not on file  . Number of children: 1  . Years of education: Not on file  . Highest education level: Not on file  Occupational History  . Occupation: NA  Social Needs  . Financial resource strain: Not on file  . Food insecurity:    Worry: Not on file    Inability: Not on file  . Transportation needs:    Medical: Not on file    Non-medical: Not on file  Tobacco Use  . Smoking status: Former Smoker    Packs/day: 0.30    Types: Cigarettes    Last attempt to quit: 04/26/2016    Years since quitting: 2.1  . Smokeless tobacco: Never Used  Substance and Sexual Activity  . Alcohol use: No    Alcohol/week: 0.0 standard drinks    Comment: quit 12/2005  . Drug use: Yes    Types: "Crack" cocaine    Comment: Previous drug use, quit 2004-05  . Sexual activity: Yes    Partners: Male    Birth control/protection: None    Comment: declined condoms   Lifestyle  . Physical activity:    Days per week: Not on file    Minutes per session: Not on file  . Stress: Not on file  Relationships  . Social connections:    Talks on phone: Not  on file    Gets together: Not on file    Attends religious service: Not on file    Active member of club or organization: Not on file    Attends meetings of clubs or organizations: Not on file    Relationship status: Not on file  Other Topics Concern  . Not on file  Social History Narrative   Lives at home w/ her husband   Caffeine: 1/2 c coffee per day     Sleep: Fair  Appetite:  Fair  Current Medications: Current Facility-Administered Medications  Medication Dose Route Frequency Provider Last Rate Last Dose  . acetaminophen (TYLENOL) tablet 650 mg  650 mg Oral Q6H PRN Nira Conn A, NP      . albuterol (PROVENTIL HFA;VENTOLIN HFA) 108 (90 Base) MCG/ACT inhaler 2 puff  2 puff Inhalation Q6H PRN Nira Conn A, NP      . alum & mag hydroxide-simeth (MAALOX/MYLANTA) 200-200-20 MG/5ML suspension 30 mL  30 mL Oral Q4H PRN Nira Conn A, NP       . dolutegravir (TIVICAY) tablet 50 mg  50 mg Oral Daily Nira Conn A, NP   50 mg at 06/22/18 0727  . emtricitabine-rilpivir-tenofovir AF (ODEFSEY) 200-25-25 MG per tablet 1 tablet  1 tablet Oral Q breakfast Nira Conn A, NP   1 tablet at 06/22/18 0727  . gabapentin (NEURONTIN) capsule 300 mg  300 mg Oral TID Nira Conn A, NP   300 mg at 06/22/18 1308  . hydrOXYzine (ATARAX/VISTARIL) tablet 25 mg  25 mg Oral TID PRN Jackelyn Poling, NP   25 mg at 06/22/18 0727  . LORazepam (ATIVAN) tablet 1 mg  1 mg Oral Q6H PRN Malvin Johns, MD   1 mg at 06/21/18 2142   Or  . LORazepam (ATIVAN) injection 2 mg  2 mg Intramuscular Q6H PRN Malvin Johns, MD   2 mg at 06/21/18 1618  . magnesium hydroxide (MILK OF MAGNESIA) suspension 30 mL  30 mL Oral Daily PRN Nira Conn A, NP      . perphenazine (TRILAFON) tablet 4 mg  4 mg Oral BID Malvin Johns, MD   4 mg at 06/22/18 0727  . topiramate (TOPAMAX) tablet 50 mg  50 mg Oral TID Malvin Johns, MD        Lab Results:  Results for orders placed or performed during the hospital encounter of 06/20/18 (from the past 48 hour(s))  Hemoglobin A1c     Status: None   Collection Time: 06/21/18  6:40 AM  Result Value Ref Range   Hgb A1c MFr Bld 5.2 4.8 - 5.6 %    Comment: (NOTE) Pre diabetes:          5.7%-6.4% Diabetes:              >6.4% Glycemic control for   <7.0% adults with diabetes    Mean Plasma Glucose 102.54 mg/dL    Comment: Performed at [facility], [address], [place], [place] [address]  Lipid panel     Status: None   Collection Time: 06/21/18  6:40 AM  Result Value Ref Range   Cholesterol 188 0 - 200 mg/dL   Triglycerides 696 <295 mg/dL   HDL 76 >28 mg/dL   Total CHOL/HDL Ratio 2.5 RATIO   VLDL 27 0 - 40 mg/dL   LDL Cholesterol 85 0 - 99 mg/dL    Comment:        Total Cholesterol/HDL:CHD Risk Coronary Heart Disease Risk Table  Men   Women  1/2 Average Risk   3.4   3.3  Average Risk       5.0   4.4  2 X  Average Risk   9.6   7.1  3 X Average Risk  23.4   11.0        Use the calculated Patient Ratio above and the CHD Risk Table to determine the patient's CHD Risk.        ATP III CLASSIFICATION (LDL):  <100     mg/dL   Optimal  161-096  mg/dL   Near or Above                    Optimal  130-159  mg/dL   Borderline  045-409  mg/dL   High  >811     mg/dL   Very High Performed at [facility], [address] [place], [place] [address]   TSH     Status: None   Collection Time: 06/21/18  6:40 AM  Result Value Ref Range   TSH 3.904 0.350 - 4.500 uIU/mL    Comment: Performed by a 3rd Generation assay with a functional sensitivity of <=0.01 uIU/mL. Performed at [facility], [address], [place], [place] [address]   Urinalysis, Routine w reflex microscopic     Status: None   Collection Time: 06/21/18  1:03 PM  Result Value Ref Range   Color, Urine YELLOW YELLOW   APPearance CLEAR CLEAR   Specific Gravity, Urine 1.010 1.005 - 1.030   pH 8.0 5.0 - 8.0   Glucose, UA NEGATIVE NEGATIVE mg/dL   Hgb urine dipstick NEGATIVE NEGATIVE   Bilirubin Urine NEGATIVE NEGATIVE   Ketones, ur NEGATIVE NEGATIVE mg/dL   Protein, ur NEGATIVE NEGATIVE mg/dL   Nitrite NEGATIVE NEGATIVE   Leukocytes, UA NEGATIVE NEGATIVE    Comment: Performed at [facility], [address], [place], [place] [address]  Pregnancy, urine     Status: None   Collection Time: 06/21/18  1:03 PM  Result Value Ref Range   Preg Test, Ur NEGATIVE NEGATIVE    Comment:        THE SENSITIVITY OF THIS METHODOLOGY IS >20 mIU/mL. Performed at [facility], [address], [place], [place] [address]     Blood Alcohol level:  Lab Results  Component Value Date   ETH <10 06/20/2018   ETH <5 12/29/2016    Metabolic Disorder Labs: Lab Results  Component Value Date   HGBA1C 5.2 06/21/2018   MPG 102.54 06/21/2018   MPG 108 12/30/2016   No results  found for: PROLACTIN Lab Results  Component Value Date   CHOL 188 06/21/2018   TRIG 137 06/21/2018   HDL 76 06/21/2018   CHOLHDL 2.5 06/21/2018   VLDL 27 06/21/2018   LDLCALC 85 06/21/2018   LDLCALC 97 07/28/2017    Physical Findings: AIMS:  , ,  ,  ,    CIWA:    COWS:     Musculoskeletal: Strength & Muscle Tone: within normal limits Gait & Station: normal Patient leans: N/A  Psychiatric Specialty Exam: Physical Exam  ROS  Blood pressure (!) 118/96, pulse 91, temperature 97.7 F (36.5 C), temperature source Oral, resp. rate 18, height 5' 7.5" (1.715 m), weight 79.8 kg.Body mass index is 27.16 kg/m.  General Appearance: Casual  Eye Contact:  Good  Speech:  Clear and Coherent  Volume:  Normal  Mood:  Dysphoric  Affect:  Constricted  Thought Process:  Coherent  Orientation:  Full (Time, Place, and Person)  Thought Content:  Logical  Suicidal Thoughts:  No  Homicidal Thoughts:  No  Memory:  Immediate;   Fair  Judgement:  Fair  Insight:  Fair  Psychomotor Activity:  Normal  Concentration:  Concentration: Fair  Recall:  [employer] of Knowledge:  Fair  Language:  Fair  Akathisia:  Negative  Handed:  Right  AIMS (if indicated):     Assets:  Communication Skills Desire for Improvement  ADL's:  Intact  Cognition:  WNL  Sleep:  Number of Hours: 6.75    Porting improvement since her episode yesterday reporting the med adjustments she would like, cognitive therapy to continue current precautions to continue current cognitive based therapy  Treatment Plan Summary: Daily contact with patient to assess and evaluate symptoms and progress in treatment and Medication management  Senya Hinzman, MD 06/22/2018, 10:17 AM

## 2018-06-22 NOTE — Progress Notes (Signed)
Nursing Progress Note: 7p-7a D: Pt currently presents with a anxious/depressed affect and behavior. Pt states "voices are improving." Interacting appropriately with the milieu. Pt reports good sleep during the previous night with current medication regimen. Pt did attend wrap-up group.  A: Pt provided with medications per providers orders. Pt's labs and vitals were monitored throughout the night. Pt supported emotionally and encouraged to express concerns and questions. Pt educated on medications.  R: Pt's safety ensured with 15 minute and environmental checks. Pt currently denies SI, HI, and VH and endorses intermittent AH. Pt verbally contracts to seek staff if SI,HI, or AVH occurs and to consult with staff before acting on any harmful thoughts. Will continue to monitor.

## 2018-06-22 NOTE — Plan of Care (Signed)
Progress note  D: pt found in her room; compliant with medication administration. Pt states she slept fair. Pt rates her depression/hopelessness/anxiety an 8/9/9 out of 10 respectively. Pt denies any physical problems but does endorse back pain that she rates at a 10/10. Pt denied any physical pain to this writer and refused medication. Pt states she is having ah regularly and requested medication 2x today for this. Pt states the voices give her passive si. Pt states her goal for today is to stop her voices and she will achieve this by staying calm pt denies any hi/vh and verbally agrees to approach staff if these become apparent or before harming herself or others [facility].  A: pt provided support and encouragement. Pt given medication per protocol and standing orders. Q554m safety checks implemented and continued. R: pt safe on the unit. Will continue to monitor.   Pt progressing in the following metrics  Problem: Safety: Goal: Periods of time without injury will increase Outcome: Progressing   Problem: Education: Goal: Knowledge of [place] [facility] Education information/materials will improve Outcome: Progressing Goal: Emotional status will improve Outcome: Progressing Goal: Mental status will improve Outcome: Progressing Goal: Verbalization of understanding the information provided will improve Outcome: Progressing

## 2018-06-22 NOTE — Therapy (Signed)
Occupational Therapy Group Note  Date:  06/22/2018 Time:  11:30 AM  Group Topic/Focus:  Yoga  Participation Level:  Active  Participation Quality:  Appropriate  Affect:  Blunted  Cognitive:  Appropriate  Insight: Improving  Engagement in Group:  Engaged  Modes of Intervention:  Activity, Discussion, Education and Socialization  Additional Comments:    S: "Oh these are really stretching out my back and legs"  O: Education given on stress management and relaxation to develop coping skills when reintegrating into community. Healthy stress management strategies brainstormed within group, pt encouraged to contribute responses. Pt guided through relaxation chair yoga. Pt asked if pain a factor in mobility, adapted exercises to mett mobility needs and safety.  A: Pt presents to group, with blunted affect. Pt engaged and participatory,appropriately following along with yoga sequence. Pt shares that she has no yoga experience, and is having back pain this date. Encouraged pt to participate without increasing baseline pain. Pt with notable improvement of affect while engaging. Did appear to be somewhat frustrated by other group member at very end of group, and excused herself slightly early.  P: OT will continue to follow up for implementation of coping skills while pt acute  Dalphine HandingKaylee Manual Navarra, MSOT, OTR/L Behavioral Health OT/ Acute Relief OT PHP Office: [phone]  Dalphine HandingKaylee Dajahnae Vondra 06/22/2018, 11:30 AM

## 2018-06-22 NOTE — Progress Notes (Signed)
Recreation Therapy Notes  Date: 12.11.19 Time: 1000 Location: 500 Hall Dayroom   Group Topic: Communication, Team Building, Problem Solving  Goal Area(s) Addresses:  Patient will effectively work with peer towards shared goal.  Patient will identify skill used to make activity successful.  Patient will identify how skills used during activity can be used to reach post d/c goals.   Behavioral Response: Engaged  Intervention: STEM Activity   Activity: Elevated Bridge. Patients were provided the following materials: 20 straws and [name] long piece of masking tape.  Patients were divided into teams of 2.  Each group was to build an elevated that would hold [name] small paperback book.  Education: Pharmacist, communityocial Skills, Building control surveyorDischarge Planning.   Education Outcome: Acknowledges education/In group clarification offered/Needs additional education.   Clinical Observations/Feedback: Pt arrived [name] little late but joined in with one of the groups to help construct their bridge.  Pt expressed they had to use teamwork, patience and creativity.  Pt expressed with your support system "you have to talk to them to let them know what's going on with you".  Pt also stated once you talk to them, you can create [name] plan and then work the plan.    Tracy RancherMarjette Aziyah [name], LRT/CTRS      Tracy AbedLindsay, Tracy [name] 06/22/2018 11:19 AM
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
## 2018-06-22 NOTE — Progress Notes (Signed)
Pt requesting a family meeting with CSW, pt's spouse and daughter. CSW explained this is typically not done for adult patients, but CSW will follow up with patient and may be able to coordinate a meeting for Friday.  Tracy Cutterharlotte Willow Shidler, LCSW-A Clinical Social Worker

## 2018-06-22 NOTE — [facility] Group Notes (Signed)
[facility] Group Notes:  (Nursing/MHT/Case Management/Adjunct)  Date:  06/22/2018  Time:  1:15 PM  Type of Therapy:  Nurse Education  Participation Level:  Did Not Attend    Raylene MiyamotoMichael R Isaak Delmundo 06/22/2018, 3:56 PM

## 2018-06-22 NOTE — Tx Team (Signed)
Interdisciplinary Treatment and Diagnostic Plan Update  06/22/2018 Time of Session: 9:00am [name] MRN: [id number]  Principal Diagnosis: Schizophrenia, acute [name])  Secondary Diagnoses: Principal Problem:   Schizophrenia, acute ([name])   Current Medications:  Current Facility-Administered Medications  Medication Dose Route Frequency Provider Last Rate Last Dose  . acetaminophen (TYLENOL) tablet 650 mg  650 mg Oral Q6H PRN Lindon Romp A, NP      . albuterol (PROVENTIL HFA;VENTOLIN HFA) 108 (90 Base) MCG/ACT inhaler 2 puff  2 puff Inhalation Q6H PRN Lindon Romp A, NP      . alum & mag hydroxide-simeth (MAALOX/MYLANTA) 200-200-20 MG/5ML suspension 30 mL  30 mL Oral Q4H PRN Lindon Romp A, NP      . buPROPion (WELLBUTRIN XL) 24 hr tablet 300 mg  300 mg Oral Daily Lindon Romp A, NP   300 mg at 06/22/18 0727  . dolutegravir (TIVICAY) tablet 50 mg  50 mg Oral Daily Lindon Romp A, NP   50 mg at 06/22/18 0727  . emtricitabine-rilpivir-tenofovir AF (ODEFSEY) 200-25-25 MG per tablet 1 tablet  1 tablet Oral Q breakfast Lindon Romp A, NP   1 tablet at 06/22/18 0727  . gabapentin (NEURONTIN) capsule 300 mg  300 mg Oral TID Lindon Romp A, NP   300 mg at 06/22/18 2831  . hydrOXYzine (ATARAX/VISTARIL) tablet 25 mg  25 mg Oral TID PRN [name], NP   25 mg at 06/22/18 0727  . LORazepam (ATIVAN) tablet 1 mg  1 mg Oral Q6H PRN [name], MD   1 mg at 06/21/18 2142   Or  . LORazepam (ATIVAN) injection 2 mg  2 mg Intramuscular Q6H PRN [name], MD   2 mg at 06/21/18 1618  . magnesium hydroxide (MILK OF MAGNESIA) suspension 30 mL  30 mL Oral Daily PRN Lindon Romp A, NP      . perphenazine (TRILAFON) tablet 4 mg  4 mg Oral BID [name], MD   4 mg at 06/22/18 0727  . topiramate (TOPAMAX) tablet 100 mg  100 mg Oral Daily Lindon Romp A, NP   100 mg at 06/22/18 5176   PTA Medications: Medications Prior to Admission  Medication Sig Dispense Refill Last Dose  . buPROPion (WELLBUTRIN  XL) 300 MG 24 hr tablet Take 1 tablet (300 mg total) by mouth daily. 30 tablet 6 Past Month at Unknown time  . COMPLERA 200-25-300 MG tablet TAKE ONE TABLET BY MOUTH ONCE DAILY (Patient taking differently: Take 1 tablet by mouth daily. ) 30 tablet 5 Past Month at Unknown time  . emtricitabine-tenofovir AF (DESCOVY) 200-25 MG tablet Take 1 tablet by mouth daily.    Past Month at Unknown time  . gabapentin (NEURONTIN) 300 MG capsule Take 300 mg by mouth 3 (three) times daily.   Past Month at Unknown time  . Lido-Capsaicin-Men-Methyl Sal (1ST MEDX-PATCH/ LIDOCAINE) 4-0.373-11-29 % PTCH Apply 1 patch topically daily. (Patient not taking: Reported on 06/20/2018) 15 patch 0 Not Taking at Unknown time  . loratadine (CLARITIN) 10 MG tablet Take 10 mg by mouth as needed (for seasonal allergies).    unknown at prn  . Oxycodone HCl 10 MG TABS Take 10 mg by mouth 3 (three) times daily as needed (pain).    Past Month at Unknown time  . PROAIR HFA 108 (90 Base) MCG/ACT inhaler Inhale 2 puffs into the lungs every 6 (six) hours as needed for shortness of breath.   5 Past Month at Unknown time  . QUEtiapine (  SEROQUEL XR) 400 MG 24 hr tablet Take 1 tablet (400 mg total) by mouth at bedtime. 30 tablet 6 Past Month at Unknown time  . rizatriptan (MAXALT) 10 MG tablet Take 10 mg by mouth as needed for migraine.    Past Month at Unknown time  . tenofovir (VIREAD) 300 MG tablet Take 300 mg by mouth daily.    Past Month at Unknown time  . TIVICAY 50 MG tablet TAKE ONE TABLET BY MOUTH DAILY (Patient taking differently: Take 50 mg by mouth daily. ) 30 tablet 5 Past Month at Unknown time  . topiramate (TOPAMAX) 100 MG tablet Take 1 tablet (100 mg total) by mouth daily. 30 tablet 6 Past Month at Unknown time    Patient Stressors: Financial difficulties Health problems Medication change or noncompliance  Patient Strengths: Ability for insight Active sense of humor General fund of knowledge Motivation for  treatment/growth Supportive family/friends  Treatment Modalities: Medication Management, Group therapy, Case management,  1 to 1 session with clinician, Psychoeducation, Recreational therapy.   Physician Treatment Plan for Primary Diagnosis: Schizophrenia, acute ([place]) Long Term Goal(s): Improvement in symptoms so as ready for discharge Improvement in symptoms so as ready for discharge   Short Term Goals: Ability to verbalize feelings will improve Ability to disclose and discuss suicidal ideas Ability to demonstrate self-control will improve Ability to identify and develop effective coping behaviors will improve Compliance with prescribed medications will improve Ability to identify triggers associated with substance abuse/mental health issues will improve  Medication Management: Evaluate patient's response, side effects, and tolerance of medication regimen.  Therapeutic Interventions: 1 to 1 sessions, Unit Group sessions and Medication administration.  Evaluation of Outcomes: Not Met  Physician Treatment Plan for Secondary Diagnosis: Principal Problem:   Schizophrenia, acute ([place])  Long Term Goal(s): Improvement in symptoms so as ready for discharge Improvement in symptoms so as ready for discharge   Short Term Goals: Ability to verbalize feelings will improve Ability to disclose and discuss suicidal ideas Ability to demonstrate self-control will improve Ability to identify and develop effective coping behaviors will improve Compliance with prescribed medications will improve Ability to identify triggers associated with substance abuse/mental health issues will improve     Medication Management: Evaluate patient's response, side effects, and tolerance of medication regimen.  Therapeutic Interventions: 1 to 1 sessions, Unit Group sessions and Medication administration.  Evaluation of Outcomes: Not Met   RN Treatment Plan for Primary Diagnosis: Schizophrenia, acute  ([place]) Long Term Goal(s): Knowledge of disease and therapeutic regimen to maintain health will improve  Short Term Goals: Ability to demonstrate self-control, Ability to participate in decision making will improve, Ability to verbalize feelings will improve, Ability to disclose and discuss suicidal ideas and Compliance with prescribed medications will improve  Medication Management: RN will administer medications as ordered by provider, will assess and evaluate patient's response and provide education to patient for prescribed medication. RN will report any adverse and/or side effects to prescribing provider.  Therapeutic Interventions: 1 on 1 counseling sessions, Psychoeducation, Medication administration, Evaluate responses to treatment, Monitor vital signs and CBGs as ordered, Perform/monitor CIWA, COWS, AIMS and Fall Risk screenings as ordered, Perform wound care treatments as ordered.  Evaluation of Outcomes: Not Met   LCSW Treatment Plan for Primary Diagnosis: Schizophrenia, acute ([place]) Long Term Goal(s): Safe transition to appropriate next level of care at discharge, Engage patient in therapeutic group addressing interpersonal concerns.  Short Term Goals: Engage patient in aftercare planning with referrals and resources, Increase social  support, Increase emotional regulation and Increase skills for wellness and recovery  Therapeutic Interventions: Assess for all discharge needs, 1 to 1 time with Social worker, Explore available resources and support systems, Assess for adequacy in community support network, Educate family and significant other(s) on suicide prevention, Complete Psychosocial Assessment, Interpersonal group therapy.  Evaluation of Outcomes: Not Met   Progress in Treatment: Attending groups: No. Participating in groups: No. Taking medication as prescribed: Yes. Toleration medication: Yes. Family/Significant other contact made: No, will contact:  Husband, Jori Moll Patient  understands diagnosis: Yes. Discussing patient identified problems/goals with staff: Yes. Medical problems stabilized or resolved: No. Denies suicidal/homicidal ideation: No. Issues/concerns per patient self-inventory: Yes.   New problem(s) identified: No, Describe:  CSW continuing to assess]  New Short Term/Long Term Goal(s): medication management for mood stabilization; elimination of SI thoughts; development of comprehensive mental wellness/sobriety plan.  Patient Goals:  "Get better."  Discharge Plan or Barriers: CSW continuing to assess. Rosa Sanchez pamphlet, Mobile Crisis information, and AA/NA information provided to patient for additional community support and resources.   Reason for Continuation of Hospitalization: Anxiety Depression Hallucinations Medication stabilization Suicidal ideation  Estimated Length of Stay: 3-5 days  Attendees: Patient: Tracy [name] 06/22/2018 9:45 AM  Physician: Dr.Farah 06/22/2018 9:45 AM  Nursing: Legrand Como 06/22/2018 9:45 AM  RN Care Manager: 06/22/2018 9:45 AM  Social Worker: Stephanie Acre, [place] 06/22/2018 9:45 AM  Recreational Therapist:  06/22/2018 9:45 AM  Other:  06/22/2018 9:45 AM  Other:  06/22/2018 9:45 AM  Other: 06/22/2018 9:45 AM    Scribe for Treatment Team: Joellen Jersey, LCSWA 06/22/2018 9:45 AM

## 2018-06-22 NOTE — Progress Notes (Signed)
Adult Psychoeducational Group Note  Date:  06/22/2018 Time:  9:07 PM  Group Topic/Focus:  Wrap-Up Group:   The focus of this group is to help patients review their daily goal of treatment and discuss progress on daily workbooks.  Participation Level:  Active  Participation Quality:  Appropriate  Affect:  Appropriate  Cognitive:  Alert  Insight: Appropriate  Engagement in Group:  Engaged  Modes of Intervention:  Discussion  Additional Comments:  Pt stated that today was "indifferent" but after visit with family, she feels better. Her goal is to get control of the voices.  Kaleen OdeaCOOKE, Jo Cerone R 06/22/2018, 9:07 PM

## 2018-06-23 LAB — PROLACTIN: Prolactin: 127.7 ng/mL — ABNORMAL HIGH (ref 4.8–23.3)

## 2018-06-23 MED ORDER — PERPHENAZINE 4 MG PO TABS
4.0000 mg | ORAL_TABLET | Freq: Three times a day (TID) | ORAL | Status: DC
  Administered 2018-06-23 – 2018-06-24 (×3): 4 mg via ORAL
  Filled 2018-06-23 (×9): qty 1

## 2018-06-23 MED ORDER — LORAZEPAM 2 MG/ML IJ SOLN
2.0000 mg | Freq: Once | INTRAMUSCULAR | Status: AC
  Administered 2018-06-23: 2 mg via INTRAMUSCULAR

## 2018-06-23 NOTE — Progress Notes (Signed)
Pt was observed in the dayroom, seen interacting with a peer. Pt attended wrap-up group. Pt appears anxious in affect and mood; brightens upon interaction. Pt denies SI/AVH/Pain at this time. Endorses HI towards a peer on unit. No new c/o's. PRN ativan and vistaril requested at bedtime. Support and encouragement offered. Will continue with POC.

## 2018-06-23 NOTE — Progress Notes (Signed)
Patient denies, SI, HI and AVH this shift.  Patient became agitated this shift due to a peer removing items from a book.  Patient was unable to be redirected and needed the support of prn medications.   Assess patient for safety, offer medications as prescribed, engage patient in 1:1 staff talks.     Patient able to contract for safety.  Continue to monitor as planned.

## 2018-06-23 NOTE — Progress Notes (Signed)
Recreation Therapy Notes  12.12.19  1025:  LRT met with patient to go over coping skills.  Pt completed a collage that focused on coping skills.  Pt cut out pictures that represented coping skills that could be used for diversions, socially, cognitive, tension releasers and physically.  Pt identified her coping skills as follows:   Diversions: pamper herself (make up, spa) Social: cook, help kids study Cognitive: family Tension Releasers: painting Physical: couldn't find anything  Pt was pleasant and expressed she enjoyed the activity because it gave her a different way to look at coping skills.  Pt also stated coping skills can "help bring you back, for instance, when I hear the voices".      , LRT/CTRS    ,  A 06/23/2018 12:00 PM 

## 2018-06-23 NOTE — [facility] Group Notes (Signed)
[facility] Group Notes:  (Nursing/MHT/Case Management/Adjunct)  Date:  06/23/2018  Time:  9:39 PM  Type of Therapy:  Wrap up group  Participation Level:  Minimal  Participation Quality:  Attentive  Affect:  Flat  Cognitive:  Appropriate  Insight:  Improving  Engagement in Group:  Minimal  Modes of Intervention:  Discussion and Support  Summary of Progress/Problems:  Annitta NeedsYavohnda was attentive but minimal in group.  She declined to answer if she had a goal today but only stated "just frustrated about how bad it was today."  Levin BaconHeather V Sebastin Perlmutter 06/23/2018, 9:39 PM

## 2018-06-23 NOTE — BH Specialist Note (Signed)
Integrated Behavioral Health Follow Up Visit  MRN: [id number] Name: Tracy [name]   Session Start time: 8:30am Session End time: 10:00am Total time: 90 minutes  Type of Service: [place] Interpretor:No. Interpretor Name and Language:n/a  SUBJECTIVE: Tracy [name] is a 52 y.o. 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 This resulted in her hearing voices again, and over the last 3 days they have gotten so overwhelming and insistent that she kill herself that she used cocaine to try to get them to stop. (Patient reports this worked in the past but did not this time) Patient locked herself in her room for 3 days and refused to eat or drink for fear of being poisoned. She also did not sleep because she feared someone would kill her in her sleep. The third day is when patient decided to try cocaine use to get rid of the voices. She reports that she then saw this counselor's face, and remembered counselor telling her that she could come to [facility] for help. Patient got her husband to drop her off at the clinic before it opened, and states that she had decided if she could not see this counselor she would go home and cut her wrists and die.   GOALS ADDRESSED: Patient will: 1.  Reduce  symptoms of: depression and psychosis   INTERVENTIONS: Interventions utilized:  Solution-Focused Strategies, Brief CBT and Supportive Counseling and Crisis Stabilization   ASSESSMENT: Patient currently experiencing paranoia, auditory and tactile hallucinations, flashbacks, tearfulness, suicidality, substance abuse. Counselor met with patient for 90 minutes to address crisis and plan for safety. Patient was labile in mood and affect. She could focus on counselor for a short period of time, but in any moments of silence would begin responding to internal stimuli and question the decisions she had made. Counselor guided patient in discussing recent events. Patient was able to share about the paranoia that resulted in her stopping her medications and in not eating or sleeping for the past three days. She was able to identify how the paranoia and not taking her meds impacted the hallucinations, and while she had insight to the voices being internal in origin was confused about whether they were "real". Counselor explored with patient the content of her auditory hallucinations. Patient stated that the voices said "all kinds of bad things" to her, and that they always seemed to come back to the conclusion that the only way she could make things better was to kill herself. Counselor challenged patient on whether she wanted to die, and she stated that she wants to live a happy and healthy life, but is convinced that this is not possible, so death is the only option. Over the course of the time that patient was with counselor, she had multiple flashbacks in which she was unable to respond to external stimuli  or communicate that she was an adult at [facility]. During these flashbacks, patient talked about people's hands being on her and wanting them to stop touching her. Counselor coached patient through the flashbacks, and patient was able to become reoriented to time and place. Patient stated several times that she needed  to be safe and that she wanted help. Counselor discussed hospitalization with patient. Patient expressed the fear that if she was hospitalized she would not be able to return to [facility] on discharge to see counselor and ID physician. Counselor explained that this is not the case, and that hospitalization is a bridge to get patient back to stability/safety so that she can return and continue to work with this Social worker. Patient was agreeable to voluntary admission at [facility]. Counselor contacted patient's husband to pick her up and transport her to [name], but when he was unable to do so counselor escorted patient to the Emergency Department at [facility].   Patient may benefit from psychiatric hospitalization and follow up care at [facility] once discharged.  PLAN: 1. Follow up appointment to be made by Behavioral Health case manager when patient is ready to discharge from [facility].   Lillie Fragmin, LCSW

## 2018-06-23 NOTE — Progress Notes (Signed)
[facility] MD Progress Note  06/23/2018 11:16 AM Mauro KaufmannYavohnda Guzy  MRN:  [id number] Subjective:   Patient was somewhat agitated this morning particular with another patient she had claimed he had stolen something out of her room At any rate the patients were separated Ms. Mikel received some Ativan as needed.  She at this point is calmer she is alert and fully oriented denies thoughts of self-harm No thoughts of harming another No EPS or TD appreciates the new medications Some adjustments were made today  Principal Problem: Schizophrenia, acute (HCC) Diagnosis: Principal Problem:   Schizophrenia, acute (HCC)  Total Time spent with patient: 20 minutes  Past Medical History:  Past Medical History:  Diagnosis Date  . Chronic back pain   . Chronic headaches   . Depression   . HIV infection (HCC)   . Joint pain   . Localized swelling of both lower legs    Legs, feet and hands  . Schizoaffective disorder (HCC)   . Stroke (cerebrum) (HCC)   . Weight loss     Past Surgical History:  Procedure Laterality Date  . ABDOMINAL HYSTERECTOMY    . BREAST EXCISIONAL BIOPSY Right 1990's  . BREAST SURGERY     Family History:  Family History  Problem Relation Age of Onset  . Diabetes Mother   . Hypertension Mother   . Tremor Mother   . Breast cancer Maternal Aunt   . High blood pressure Sister   . High blood pressure Maternal Uncle   . Diabetes Maternal Uncle     Social History:  Social History   Substance and Sexual Activity  Alcohol Use No  . Alcohol/week: 0.0 standard drinks   Comment: quit 12/2005     Social History   Substance and Sexual Activity  Drug Use Yes  . Types: "Crack" cocaine   Comment: Previous drug use, quit 2004-05    Social History   Socioeconomic History  . Marital status: Married    Spouse name: Not on file  . Number of children: 1  . Years of education: Not on file  . Highest education level: Not on file  Occupational History  . Occupation: NA  Social  Needs  . Financial resource strain: Not on file  . Food insecurity:    Worry: Not on file    Inability: Not on file  . Transportation needs:    Medical: Not on file    Non-medical: Not on file  Tobacco Use  . Smoking status: Former Smoker    Packs/day: 0.30    Types: Cigarettes    Last attempt to quit: 04/26/2016    Years since quitting: 2.1  . Smokeless tobacco: Never Used  Substance and Sexual Activity  . Alcohol use: No    Alcohol/week: 0.0 standard drinks    Comment: quit 12/2005  . Drug use: Yes    Types: "Crack" cocaine    Comment: Previous drug use, quit 2004-05  . Sexual activity: Yes    Partners: Male    Birth control/protection: None    Comment: declined condoms   Lifestyle  . Physical activity:    Days per week: Not on file    Minutes per session: Not on file  . Stress: Not on file  Relationships  . Social connections:    Talks on phone: Not on file    Gets together: Not on file    Attends religious service: Not on file    Active member of club or organization: Not on file  Attends meetings of clubs or organizations: Not on file    Relationship status: Not on file  Other Topics Concern  . Not on file  Social History Narrative   Lives at home w/ her husband   Caffeine: 1/2 c coffee per day   Additional Social History:                         Sleep: Good  Appetite:  Good  Current Medications: Current Facility-Administered Medications  Medication Dose Route Frequency Provider Last Rate Last Dose  . acetaminophen (TYLENOL) tablet 650 mg  650 mg Oral Q6H PRN Nira Conn A, NP      . albuterol (PROVENTIL HFA;VENTOLIN HFA) 108 (90 Base) MCG/ACT inhaler 2 puff  2 puff Inhalation Q6H PRN Nira Conn A, NP      . alum & mag hydroxide-simeth (MAALOX/MYLANTA) 200-200-20 MG/5ML suspension 30 mL  30 mL Oral Q4H PRN Nira Conn A, NP      . dolutegravir (TIVICAY) tablet 50 mg  50 mg Oral Daily Nira Conn A, NP   50 mg at 06/23/18 0734  .  emtricitabine-rilpivir-tenofovir AF (ODEFSEY) 200-25-25 MG per tablet 1 tablet  1 tablet Oral Q breakfast Nira Conn A, NP   1 tablet at 06/23/18 0735  . gabapentin (NEURONTIN) capsule 300 mg  300 mg Oral TID Nira Conn A, NP   300 mg at 06/23/18 0735  . hydrOXYzine (ATARAX/VISTARIL) tablet 25 mg  25 mg Oral TID PRN Jackelyn Poling, NP   25 mg at 06/22/18 2128  . LORazepam (ATIVAN) tablet 1 mg  1 mg Oral Q6H PRN Malvin Johns, MD   1 mg at 06/23/18 0435   Or  . LORazepam (ATIVAN) injection 2 mg  2 mg Intramuscular Q6H PRN Malvin Johns, MD   2 mg at 06/21/18 1618  . magnesium hydroxide (MILK OF MAGNESIA) suspension 30 mL  30 mL Oral Daily PRN Nira Conn A, NP      . perphenazine (TRILAFON) tablet 4 mg  4 mg Oral TID Malvin Johns, MD      . topiramate (TOPAMAX) tablet 50 mg  50 mg Oral TID Malvin Johns, MD   50 mg at 06/23/18 4098    Lab Results:  Results for orders placed or performed during the hospital encounter of 06/20/18 (from the past 48 hour(s))  Urinalysis, Routine w reflex microscopic     Status: None   Collection Time: 06/21/18  1:03 PM  Result Value Ref Range   Color, Urine YELLOW YELLOW   APPearance CLEAR CLEAR   Specific Gravity, Urine 1.010 1.005 - 1.030   pH 8.0 5.0 - 8.0   Glucose, UA NEGATIVE NEGATIVE mg/dL   Hgb urine dipstick NEGATIVE NEGATIVE   Bilirubin Urine NEGATIVE NEGATIVE   Ketones, ur NEGATIVE NEGATIVE mg/dL   Protein, ur NEGATIVE NEGATIVE mg/dL   Nitrite NEGATIVE NEGATIVE   Leukocytes, UA NEGATIVE NEGATIVE    Comment: Performed at [facility], [address] [place], [place] [address]  Pregnancy, urine     Status: None   Collection Time: 06/21/18  1:03 PM  Result Value Ref Range   Preg Test, Ur NEGATIVE NEGATIVE    Comment:        THE SENSITIVITY OF THIS METHODOLOGY IS >20 mIU/mL. Performed at [facility], [address], [place], [place] [address]   Prolactin     Status: Abnormal   Collection Time: 06/22/18   6:35 AM  Result Value Ref Range   Prolactin 127.7 (H) 4.8 - 23.3 ng/mL    Comment: (NOTE) Performed At: [facility] [address] [place], [place] [id number] Jolene Schimke MD NW:[id number]     Blood Alcohol level:  Lab Results  Component Value Date   [name] <10 06/20/2018   ETH <5 12/29/2016    Metabolic Disorder Labs: Lab Results  Component Value Date   HGBA1C 5.2 06/21/2018   MPG 102.54 06/21/2018   MPG 108 12/30/2016   Lab Results  Component Value Date   PROLACTIN 127.7 (H) 06/22/2018   Lab Results  Component Value Date   CHOL 188 06/21/2018   TRIG 137 06/21/2018   HDL 76 06/21/2018   CHOLHDL 2.5 06/21/2018   VLDL 27 06/21/2018   LDLCALC 85 06/21/2018   LDLCALC 97 07/28/2017    Physical Findings: AIMS:  , ,  ,  ,    CIWA:    COWS:     Musculoskeletal: Strength & Muscle Tone: within normal limits Gait & Station: normal Patient leans: N/A  Psychiatric Specialty Exam: Physical Exam  ROS  Blood pressure (!) 127/93, pulse 90, temperature 97.7 F (36.5 C), temperature source Oral, resp. rate 18, height 5' 7.5" (1.715 m), weight 79.8 kg.Body mass index is 27.16 kg/m.  General Appearance: Casual  Eye Contact:  Good  Speech:  Clear and Coherent  Volume:  Increased  Mood:  Angry for period of time then settled back down  Affect:  Appropriate  Thought Process:  Coherent  Orientation:  Full (Time, Place, and Person)  Thought Content:  Logical  Suicidal Thoughts:  No  Homicidal Thoughts:  No  Memory:  Immediate;   Fair  Judgement:  Fair  Insight:  Good  Psychomotor Activity:  Normal  Concentration:  Concentration: Good  Recall:  Good  Fund of Knowledge:  Good  Language:  Good  Akathisia:  Negative  Handed:  Right  AIMS (if indicated):     Assets:  Communication Skills Desire for Improvement  ADL's:  Intact  Cognition:  WNL  Sleep:  Number of Hours: 6.25     Treatment Plan Summary: Daily contact with patient to assess and evaluate  symptoms and progress in treatment and Medication management  For acute agitation and anger outburst we will add lorazepam one-time dose 2 mg For HIV continue current HIV meds For schizoaffective type disorder escalate Trilafon to 4 mg 3 times daily  Orlo Brickle, MD 06/23/2018, 11:16 AM

## 2018-06-23 NOTE — Progress Notes (Signed)
Pt tearful and cx of increased agitation and command AH. Pt provided with PRN. Will continue to monitor.

## 2018-06-24 MED ORDER — TOPIRAMATE 50 MG PO TABS: 50.0000 mg | ORAL_TABLET | Freq: Three times a day (TID) | ORAL | 2 refills | Status: DC

## 2018-06-24 MED ORDER — PERPHENAZINE 4 MG PO TABS: 4.0000 mg | ORAL_TABLET | Freq: Three times a day (TID) | ORAL | 2 refills | Status: DC

## 2018-06-24 MED ORDER — BENZTROPINE MESYLATE 0.5 MG PO TABS: 0.5000 mg | ORAL_TABLET | Freq: Two times a day (BID) | ORAL | 2 refills | Status: DC

## 2018-06-24 NOTE — [facility] Suicide Risk Assessment (Signed)
[facility] INPATIENT:  Family/Significant Other Suicide Prevention Education  Suicide Prevention Education:  Education Completed; daughter, Tracy [name] has been identified by the patient as the family member/significant other with whom the patient will be residing, and identified as the person(s) who will aid the patient in the event of a mental health crisis (suicidal ideations/suicide attempt).  With written consent from the patient, the family member/significant other has been provided the following suicide prevention education, prior to the and/or following the discharge of the patient.  The suicide prevention education provided includes the following:  Suicide risk factors  Suicide prevention and interventions  National Suicide Hotline telephone number  [facility] assessment telephone number  [facility] Emergency Assistance 911  [facility] and/or [facility]idential Mobile Crisis [facility] telephone number  Request made of family/significant other to:  Remove weapons (e.g., guns, rifles, knives), all items previously/currently identified as safety concern.    Remove drugs/medications (over-the-counter, prescriptions, illicit drugs), all items previously/currently identified as a safety concern.  The family member/significant other verbalizes understanding of the suicide prevention education information provided.  The family member/significant other agrees to remove the items of safety concern listed above.  CSW spoke with patient and daughter Tracy [name] at patient and family request. Tracy [name]'s primary concern for the patient and discharge was having [name] outpatient therapist to schedule a family therapy session and for the patient to have substance use outpatient groups as well. 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 Patient agreeable to resources and groups.   [facility]  resources with [facility] prior to discharge. Patient has outpatient intake appointment on 12/20 and was advised to inquire about a family session. No other safety concerns.  Tracy [name] 06/24/2018, 9:53 AM

## 2018-06-24 NOTE — Progress Notes (Signed)
Recreation Therapy Notes  Date: 12.13.19 Time: 1000 Location: 500 Hall Dayroom  Group Topic: Movie  Goal Area(s) Addresses:  Patient will identify some of the struggles of the characters of the movie. Patient will identify some of the coping skills used by the characters in the movie.  Behavioral Response: Engaged  Intervention: Therapeutic Movie  Activity: Otherhood. LRT played a movie for the patients that dealt with loneliness, acceptance, depression and loss of loved ones.  Patients were to identify how the characters in the movie coped with the issues they were dealing with and how those situations shaped the characters.  Education: Discharge Planning.   Education Outcome: Acknowledges education/In group clarification offered/Needs additional education.   Clinical Observations/Feedback:  Pt was engaged and attentive to the movie.  Pt expressed that she liked the movie.    Caroll RancherMarjette Marquett Bertoli, LRT/CTRS    Caroll RancherLindsay, Tanita Palinkas A 06/24/2018 11:44 AM

## 2018-06-24 NOTE — Progress Notes (Signed)
Recreation Therapy Notes  INPATIENT RECREATION TR PLAN  Patient Details Name: Tracy [name] MRN: [id number] DOB: [birth year] Today's Date: 06/24/2018  Rec Therapy Plan Is patient appropriate for Therapeutic Recreation?: Yes Treatment times per week: about 3 days Estimated Length of Stay: 5-7 days TR Treatment/Interventions: Group participation (Comment)  Discharge Criteria Pt will be discharged from therapy if:: Discharged Treatment plan/goals/alternatives discussed and agreed upon by:: Patient/family  Discharge Summary Short term goals set: See patient care plan Short term goals met: Complete Progress toward goals comments: Groups attended Which groups?: Coping skills, Other (Comment)(Team building; Therapeutic movie) Reason goals not met: None Therapeutic equipment acquired: N/A Reason patient discharged from therapy: Discharge from hospital Pt/family agrees with progress & goals achieved: Yes Date patient discharged from therapy: 06/24/18    Victorino Sparrow, LRT/CTRS  Ria Comment, Reinhold Rickey A 06/24/2018, 11:42 AM

## 2018-06-24 NOTE — Progress Notes (Signed)
CSW completed SPE with patient and daughter. Patient and family requesting substance use outpatient resources in addition to patient's individual outpatient appointments. CSW to provide information regard [facility] prior to discharge.   Patient's daughter to pick up patient at 11am for discharge.  Tracy Cutterharlotte Juwon Scripter, LCSW-A Clinical Social Worker

## 2018-06-24 NOTE — Plan of Care (Signed)
Pt was able to identify coping skills after completing a 1:1 session with LRT.   Caroll RancherMarjette Hrishikesh Hoeg, LRT/CTRS

## 2018-06-24 NOTE — Discharge Summary (Signed)
Physician Discharge Summary Note  Patient:  Tracy [name] is an 52 y.o., female MRN:  [id number] DOB:  Oct 28, 1965 Patient phone:  [phone] (home)  Patient address:   [address] [place] [address],  Total Time spent with patient: 15 minutes  Date of Admission:  06/20/2018 Date of Discharge: 06/24/18  Reason for Admission:   Based on the HPI  This is an admission assessment for this 52 year old AA female with prior hx of mental illness. She is admitted to the [facility] from the [facility] ED with chief complaints of suicidal ideations, paranoid ideations, auditory & visual/tactile hallucinations. Chart review also indicated that patient reported at the ED that she has not slept in 3 days & had stopped taking her mental health medications for a month because she thoughts the medicines were poisoning her. Patient apparently did confide in her therapist who decided to bring her to the ED for further evaluation. She is admitted to [facility] for mood stabilization treatments.  During this assessment, Tracy [name] reports, "My counselor took me to the ED yesterday. You know, the voices, so many of them having conversations with me. Sometime, I talk with them, other times, they get on my nerves. The voices has been there, but, they got worse this past Thursday. There were a lot of them conversing. I have been on medicines for the voices all my life. I was taking Wellbutrin & Seroquel. I don't think these medicines are helping the voices any more. I have been taking them for so long, may be, it is time to change them. I do sleep well, sometimes, I don't. I'm feeling depressed because of the voices. It is just too much to deal with at times. I started having the thoughts to hurt myself 6 months ago. It comes & goes, but it worsened last Thursday because the voices will not let out. I have tried a lot of times to hurt myself, but this time, I did not try to hurt me. I went to my therapist instead. 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 This time, the cocaine did not help. I don't have a psychiatrist, I get my medicines at the ID clinic. My therapist is at the ID clinic as well. May be, you guys can help me find me a psychiatrist & a therapist to see after I get discharged. I'm trying to rest in my room right now because I don't like to be around a lot of people".    Principal Problem: Schizophrenia, acute [facility]) Discharge Diagnoses: Principal Problem:   Schizophrenia, acute (HCC)    Past Medical History:  Past Medical History:  Diagnosis Date  . Chronic back pain   . Chronic headaches   . Depression   . HIV infection (HCC)   . Joint pain   . Localized swelling of both lower legs    Legs, feet and hands  . Schizoaffective disorder (HCC)   . Stroke (cerebrum) (HCC)   . Weight loss     Past Surgical History:  Procedure Laterality Date  . ABDOMINAL HYSTERECTOMY    . BREAST EXCISIONAL BIOPSY Right 1990's  . BREAST SURGERY     Family History:  Family History  Problem Relation Age of Onset  . Diabetes Mother   . Hypertension Mother   . Tremor Mother   . Breast cancer Maternal Aunt   . High blood pressure  Sister   . High blood pressure Maternal Uncle   . Diabetes Maternal Uncle     Social History:  Social History   Substance and Sexual Activity  Alcohol Use No  . Alcohol/week: 0.0 standard drinks   Comment: quit 12/2005     Social History   Substance and Sexual Activity  Drug Use Yes  . Types: "Crack" cocaine   Comment: Previous drug use, quit 2004-05    Social History   Socioeconomic History  . Marital status: Married    Spouse name: Not on file  . Number of children: 1  . Years of education: Not on file  . Highest education level: Not on file  Occupational History  . Occupation: NA  Social Needs  . Financial resource strain: Not on file  . Food insecurity:     Worry: Not on file    Inability: Not on file  . Transportation needs:    Medical: Not on file    Non-medical: Not on file  Tobacco Use  . Smoking status: Former Smoker    Packs/day: 0.30    Types: Cigarettes    Last attempt to quit: 04/26/2016    Years since quitting: 2.1  . Smokeless tobacco: Never Used  Substance and Sexual Activity  . Alcohol use: No    Alcohol/week: 0.0 standard drinks    Comment: quit 12/2005  . Drug use: Yes    Types: "Crack" cocaine    Comment: Previous drug use, quit 2004-05  . Sexual activity: Yes    Partners: Male    Birth control/protection: None    Comment: declined condoms   Lifestyle  . Physical activity:    Days per week: Not on file    Minutes per session: Not on file  . Stress: Not on file  Relationships  . Social connections:    Talks on phone: Not on file    Gets together: Not on file    Attends religious service: Not on file    Active member of club or organization: Not on file    Attends meetings of clubs or organizations: Not on file    Relationship status: Not on file  Other Topics Concern  . Not on file  Social History Narrative   Lives at home w/ her husband   Caffeine: 1/2 c coffee per day    Hospital Course:   Once here the patient was generally cooperative and compliant she requested several specific medication changes she requested to discontinue the Wellbutrin fearing it was making her worse or not helping at the very least and she wanted a different medication for voices described as inside her head we used perphenazine and she responded very well she had 1 bout of irritability and did get some lorazepam but this was provoked by a psychotic patient who had accused her of stealing something at any rate she settled down very quickly after that by the date of the 13th she was alert and oriented to person place time situation denied wanting to harm self or others was contracting fully had no EPS or TD reported a resolution in the  internal voices again never reported external voices or visual hallucinations.  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 rate 18, height 5' 7.5" (1.715 m), weight 79.8 kg.Body mass index is 27.16 kg/m.  General Appearance: Casual  Eye Contact:  Good  Speech:  Clear and Coherent  Volume:  Normal  Mood:  Euthymic  Affect:  Congruent  Thought Process:  Coherent  Orientation:  Full (Time, Place, and Person)  Thought Content:  Logical  Suicidal Thoughts:  No  Homicidal Thoughts:  No  Memory:  Recent;   Fair  Judgement:  Fair  Insight:  Fair  Psychomotor Activity:  Normal  Concentration:  Concentration: Good  Recall:  Good  Fund of Knowledge:  Good  Language:  Negative  Akathisia:  Negative  Handed:  Right  AIMS (if indicated):     Assets:  Communication Skills  ADL's:  Intact  Cognition:  WNL  Sleep:  Number of Hours: 6.75     Have you used any form of tobacco in the last 30 days? (Cigarettes, Smokeless Tobacco, Cigars, and/or Pipes): No  Has this patient used any form of tobacco in the last 30 days? (Cigarettes, Smokeless Tobacco, Cigars, and/or Pipes) Yes, No  Blood Alcohol level:  Lab Results  Component Value Date   ETH <10 06/20/2018   ETH <5 12/29/2016    Metabolic Disorder Labs:  Lab Results  Component Value Date   HGBA1C 5.2 06/21/2018   MPG 102.54 06/21/2018   MPG 108 12/30/2016   Lab Results  Component Value Date   PROLACTIN 127.7 (H) 06/22/2018   Lab Results  Component Value Date   CHOL 188 06/21/2018   TRIG 137 06/21/2018   HDL 76 06/21/2018   CHOLHDL 2.5 06/21/2018   VLDL 27 06/21/2018   LDLCALC 85  06/21/2018   LDLCALC 97 07/28/2017    See Psychiatric Specialty Exam and Suicide Risk Assessment completed by Attending Physician prior to discharge.  Discharge destination:  Home  Is patient on multiple antipsychotic therapies at discharge:  No   Has Patient had three or more failed trials of antipsychotic monotherapy by history:  No  Recommended Plan for Multiple Antipsychotic Therapies: NA   Allergies as of 06/24/2018      Reactions   Tetracyclines & Related Itching   Tramadol Itching   Flexeril [cyclobenzaprine] Rash   Moxifloxacin Rash      Medication List    STOP taking these medications   buPROPion 300 MG 24 hr tablet Commonly known as:  WELLBUTRIN XL   Oxycodone HCl 10 MG Tabs   QUEtiapine 400 MG 24 hr tablet Commonly known as:  SEROQUEL XR     TAKE these medications     Indication  benztropine 0.5 MG tablet Commonly known as:  COGENTIN Take 1 tablet (0.5 mg total) by mouth 2 (two) times daily.  Indication:  Extrapyramidal Reaction caused by Medications   COMPLERA 200-25-300 MG tablet Generic drug:  emtricitabine-rilpivir-tenofovir DF TAKE ONE TABLET BY MOUTH ONCE DAILY  Indication:  HIV Disease   DESCOVY 200-25 MG tablet Generic drug:  emtricitabine-tenofovir AF Take 1 tablet by mouth daily.  Indication:  HIV Disease   gabapentin 300 MG capsule Commonly known as:  NEURONTIN Take 300 mg by mouth 3 (three) times daily.  Indication:  Diabetes with Nerve Disease   Lido-Capsaicin-Men-Methyl Sal 4-0.373-11-29 % Ptch Commonly known as:  1ST MEDX-PATCH/ LIDOCAINE Apply 1 patch topically daily.  Indication:  Apply to sore muscles   loratadine 10 MG tablet Commonly known as:  CLARITIN  Take 10 mg by mouth as needed (for seasonal allergies).  Indication:  Angioedema   perphenazine 4 MG tablet Commonly known as:  TRILAFON Take 1 tablet (4 mg total) by mouth 3 (three) times daily.  Indication:  Psychosis   PROAIR HFA 108 (90 Base) MCG/ACT  inhaler Generic drug:  albuterol Inhale 2 puffs into the lungs every 6 (six) hours as needed for shortness of breath.  Indication:  Chronic Obstructive Lung Disease   rizatriptan 10 MG tablet Commonly known as:  MAXALT Take 10 mg by mouth as needed for migraine.  Indication:  Migraine Headache   tenofovir 300 MG tablet Commonly known as:  VIREAD Take 300 mg by mouth daily.  Indication:  HIV Disease   TIVICAY 50 MG tablet Generic drug:  dolutegravir TAKE ONE TABLET BY MOUTH DAILY What changed:  how much to take  Indication:  HIV Disease   topiramate 50 MG tablet Commonly known as:  TOPAMAX Take 1 tablet (50 mg total) by mouth 3 (three) times daily. What changed:    medication strength  how much to take  when to take this  Indication:  Fine to Coarse Slow Tremor affecting Head, Hands & Voice      Follow-up Information    [facility]. Go on 07/01/2018.   Specialty:  Behavioral Health Why:  Your next follow up appointment is Friday, 07/01/18 at 8:30a.  Please bring: photo ID, proof of insurance, social security card, and discharge paperwork from this hospitalization.  [address] [place] [address] [phone]           Follow-up recommendations:  Activity:  full  SignedMalvin Johns, MD 06/24/2018, 10:12 AM

## 2018-06-24 NOTE — BHH Suicide Risk Assessment (Signed)
[facility]   Principal Problem: Schizophrenia, acute [facility](HCC) Discharge Diagnoses: Principal Problem:   Schizophrenia, acute (HCC)   Total Time spent with patient: 35  Current mental status exam-alert oriented to person place time situation denies wanting to harm self or others denies acute auditory or visual loose [place] no thoughts of self-harm on screening  Mental Status Per Nursing Assessment::   On Admission:  Suicidal ideation indicated by patient  Demographic Factors:  Unemployed  Loss Factors: Decrease in vocational status  Historical Factors: NA  Risk Reduction Factors:   Positive social support  Continued Clinical Symptoms:  Schizophrenia:   Paranoid or undifferentiated type  Cognitive Features That Contribute To Risk:  None    Suicide Risk:  Minimal: No identifiable suicidal ideation.  Patients presenting with no risk factors but with morbid ruminations; may be classified as minimal risk based on the severity of the depressive symptoms  Follow-up Information    [facility]. Go on 07/01/2018.   Specialty:  Behavioral Health Why:  Your next follow up appointment is Friday, 07/01/18 at 8:30a.  Please bring: photo ID, proof of insurance, social security card, and discharge paperwork from this hospitalization.  Contact information: [address] [place] [address] [phone]           Plan Of Care/Follow-up recommendations:  Activity:  full  Ekam Besson, MD 06/24/2018, 8:17 AM

## 2018-06-24 NOTE — Progress Notes (Signed)
  [facility] :  Will you be returning to the same living situation after discharge:  No. Going to stay with friend "Rosanne AshingJim" for two weeks and then returning to daughter's home. [place], so no conflicts with aftercare. At discharge, do you have transportation home?: Yes,  daughter picking up patient at 11am Do you have the ability to pay for your medications: Yes,  Medicaid  Release of information consent forms completed and in the chart;  Patient's signature needed at discharge.  Patient to Follow up at: Follow-up Information    [facility]. Go on 07/01/2018.   Specialty:  Behavioral Health Why:  Your next follow up appointment is Friday, 07/01/18 at 8:30a.  Please bring: photo ID, proof of insurance, social security card, and discharge paperwork from this hospitalization.  Contact information: [address] [place] [address] [phone]           Next level of care provider has access to [facility] Link:no  Safety Planning and Suicide Prevention discussed: Yes,  with patient and daughter  Have you used any form of tobacco in the last 30 days? (Cigarettes, Smokeless Tobacco, Cigars, and/or Pipes): No  Has patient been referred to the Quitline?: Patient refused referral  Patient has been referred for addiction treatment: Yes  Darreld McleanCharlotte C Toney Difatta, LCSWA 06/24/2018, 9:57 AM

## 2018-06-24 NOTE — BHH Suicide Risk Assessment (Signed)
BHH INPATIENT:  Family/Significant Other Suicide Prevention Education  Suicide Prevention Education:  Contact Attempts: sister, [name] ([phone]) has been identified by the patient as the family member/significant other with whom the patient will be residing, and identified as the person(s) who will aid the patient in the event of a mental health crisis.  With written consent from the patient, two attempts were made to provide suicide prevention education, prior to and/or following the patient's discharge.  We were unsuccessful in providing suicide prevention education.  A suicide education pamphlet was given to the patient to share with family/significant other.  Date and time of first attempt: 09:30am 06/24/18 Date and time of second attempt: CSW TO ATTEMPT AT A LATER TIME  Tracy McleanCharlotte C Jerre [name] 06/24/2018, 9:29 AM

## 2018-06-24 NOTE — Plan of Care (Signed)
Discharge Note  Patient verbalizes readiness for discharge. Follow up plan explained, AVS, Transition record and SRA given. Prescriptions and teaching provided. Belongings returned and signed for. Suicide safety plan completed and signed. Patient verbalizes understanding. Patient denies SI/HI and assures this Clinical research associatewriter he will seek assistance should that change. Patient discharged to lobby where daughter was waiting.  Problem: Safety: Goal: Periods of time without injury will increase Outcome: Adequate for Discharge   Problem: Education: Goal: Knowledge of [place] [facility] Education information/materials will improve Outcome: Adequate for Discharge Goal: Emotional status will improve Outcome: Adequate for Discharge Goal: Mental status will improve Outcome: Adequate for Discharge Goal: Verbalization of understanding the information provided will improve Outcome: Adequate for Discharge   Problem: Activity: Goal: Interest or engagement in activities will improve Outcome: Adequate for Discharge Goal: Sleeping patterns will improve Outcome: Adequate for Discharge   Problem: Coping: Goal: Ability to verbalize frustrations and anger appropriately will improve Outcome: Adequate for Discharge Goal: Ability to demonstrate self-control will improve Outcome: Adequate for Discharge   Problem: Health Behavior/Discharge Planning: Goal: Identification of resources available to assist in meeting health care needs will improve Outcome: Adequate for Discharge Goal: Compliance with treatment plan for underlying cause of condition will improve Outcome: Adequate for Discharge   Problem: Physical Regulation: Goal: Ability to maintain clinical measurements within normal limits will improve Outcome: Adequate for Discharge   Problem: Safety: Goal: Periods of time without injury will increase Outcome: Adequate for Discharge   Problem: Education: Goal: Ability to make informed decisions regarding  treatment will improve Outcome: Adequate for Discharge   Problem: Coping: Goal: Coping ability will improve Outcome: Adequate for Discharge   Problem: Health Behavior/Discharge Planning: Goal: Identification of resources available to assist in meeting health care needs will improve Outcome: Adequate for Discharge   Problem: Medication: Goal: Compliance with prescribed medication regimen will improve Outcome: Adequate for Discharge   Problem: Self-Concept: Goal: Ability to disclose and discuss suicidal ideas will improve Outcome: Adequate for Discharge Goal: Will verbalize positive feelings about self Outcome: Adequate for Discharge   Problem: Activity: Goal: Will verbalize the importance of balancing activity with adequate rest periods Outcome: Adequate for Discharge   Problem: Education: Goal: Will be free of psychotic symptoms Outcome: Adequate for Discharge Goal: Knowledge of the prescribed therapeutic regimen will improve Outcome: Adequate for Discharge   Problem: Coping: Goal: Coping ability will improve Outcome: Adequate for Discharge Goal: Will verbalize feelings Outcome: Adequate for Discharge   Problem: Health Behavior/Discharge Planning: Goal: Compliance with prescribed medication regimen will improve Outcome: Adequate for Discharge   Problem: Nutritional: Goal: Ability to achieve adequate nutritional intake will improve Outcome: Adequate for Discharge   Problem: Role Relationship: Goal: Ability to communicate needs accurately will improve Outcome: Adequate for Discharge Goal: Ability to interact with others will improve Outcome: Adequate for Discharge   Problem: Safety: Goal: Ability to redirect hostility and anger into socially appropriate behaviors will improve Outcome: Adequate for Discharge Goal: Ability to remain free from injury will improve Outcome: Adequate for Discharge   Problem: Self-Care: Goal: Ability to participate in self-care as  condition permits will improve Outcome: Adequate for Discharge   Problem: Self-Concept: Goal: Will verbalize positive feelings about self Outcome: Adequate for Discharge   Problem: Activity: Goal: Will identify at least one activity in which they can participate Outcome: Adequate for Discharge   Problem: Coping: Goal: Ability to identify and develop effective coping behavior will improve Outcome: Adequate for Discharge Goal: Ability to interact with others  will improve Outcome: Adequate for Discharge Goal: Demonstration of participation in decision-making regarding own care will improve Outcome: Adequate for Discharge Goal: Ability to use eye contact when communicating with others will improve Outcome: Adequate for Discharge   Problem: Health Behavior/Discharge Planning: Goal: Identification of resources available to assist in meeting health care needs will improve Outcome: Adequate for Discharge   Problem: Self-Concept: Goal: Will verbalize positive feelings about self Outcome: Adequate for Discharge

## 2018-06-30 ENCOUNTER — Telehealth: Payer: Self-pay | Admitting: *Deleted

## 2018-06-30 NOTE — Telephone Encounter (Signed)
Per verbal from Dr Orvan Falconerampbell have called the patient several times and have not been able to reach her. Her phone mail box is full and can not leave a message. Will schedule her for a visit once she returns call.

## 2018-07-08 ENCOUNTER — Ambulatory Visit: Payer: Medicaid Other | Admitting: Licensed Clinical Social Worker

## 2018-07-11 ENCOUNTER — Ambulatory Visit (INDEPENDENT_AMBULATORY_CARE_PROVIDER_SITE_OTHER): Payer: Medicaid Other | Admitting: Licensed Clinical Social Worker

## 2018-07-11 DIAGNOSIS — F4312 Post-traumatic stress disorder, chronic: Secondary | ICD-10-CM

## 2018-07-11 NOTE — BH Specialist Note (Signed)
Integrated Behavioral Health Follow Up Visit  MRN: [id number] Name: Mauro KaufmannYavohnda Corsi  Session Start time: 8:38am  Session End time: 9:09am Total time: 30 minutes  Type of Service: Integrated Behavioral Health- Individual/Family Interpretor:No. Interpretor Name and Language: n/a  SUBJECTIVE: Mauro KaufmannYavohnda Castillo is a 52 y.o. female accompanied by self Patient reports the following symptoms/concerns: anxiety, sleeping a lot, some intrusive thoughts  OBJECTIVE: Mood: Euthymic and Affect: Constricted Risk of harm to self or others: No plan to harm self or others  LIFE CONTEXT: [facility] patient and her husband have moved to his aunt's house in [place]. She reports that they live in one end of the house, his aunt in the other, and they share the common area in the middle. Patient indicates that her stress level has been much lower since moving there, and that she thinks the medications from the hospital are working better for her, as well.   GOALS ADDRESSED: Patient will: 1.  Reduce symptoms of: anxiety   INTERVENTIONS: Interventions utilized:  Brief CBT  ASSESSMENT: Patient currently experiencing anxiety, hypersomnia, minimal intrusive thoughts. She presents smiling and pleasant, sometimes laughing. Counselor commended patient on how well she is doing. Patient attributes much of it to being in a "less stressful and chaotic" situation, though she and husband do not plan to stay there long term. Counselor explored with patient what makes the new space better for her. Patient identified that she has space for herself when she needs it, [place], and she is not expected to take care of anyone except herself. Patient emphasized that it is time to focus on herself now, so that she does not get back to the place she was in a few weeks ago. Counselor guided patient in a functional analysis of the recent decompensation. Patient  was able to identify that the stress led her to paranoia, which resulted in her not taking her medication and in turn resulted in her hearing more and louder voices. 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 Counselor explored with patient choice points where something different could have been done to change the progression. Patient identified that she could have come to see counselor and leaned on her supports when she began feeling stressed, rather than allowing herself to believe that she was "too depressed and anxious" to seek help.  Counselor praised patient for recognizing this, and emphasized that feeling depressed or stressed is the perfect time to come to counseling. Counselor also guided patient in identifying some other supports and ways of coping with stress. Patient will be having back surgery in 16 days. Counselor and patient brainstormed ways to cope with recovery after surgery.   Patient may benefit from ongoing CBT and Reality Therapy sessions  PLAN: 1. Follow up with behavioral health clinician on : 07/18/2018.  Angus Palmsegina Alexander, LCSW

## 2018-07-18 ENCOUNTER — Encounter: Payer: Self-pay | Admitting: Surgery

## 2018-07-18 ENCOUNTER — Ambulatory Visit (HOSPITAL_COMMUNITY): Payer: Self-pay | Admitting: Orthopedic Surgery

## 2018-07-18 ENCOUNTER — Ambulatory Visit (INDEPENDENT_AMBULATORY_CARE_PROVIDER_SITE_OTHER): Payer: Medicaid Other | Admitting: Licensed Clinical Social Worker

## 2018-07-18 ENCOUNTER — Ambulatory Visit (INDEPENDENT_AMBULATORY_CARE_PROVIDER_SITE_OTHER): Payer: Medicaid Other | Admitting: Surgery

## 2018-07-18 ENCOUNTER — Other Ambulatory Visit: Payer: Self-pay

## 2018-07-18 VITALS — BP 136/89 | HR 81 | Resp 18 | Ht 67.5 in | Wt 176.0 lb

## 2018-07-18 DIAGNOSIS — M479 Spondylosis, unspecified: Secondary | ICD-10-CM

## 2018-07-18 DIAGNOSIS — F4312 Post-traumatic stress disorder, chronic: Secondary | ICD-10-CM | POA: Diagnosis not present

## 2018-07-18 NOTE — BH Specialist Note (Signed)
Integrated Behavioral Health Follow Up Visit  MRN: [id number] Name: Tracy [name]  Session Start time: 10:05am  Session End time: 10:45am Total time: 40 minutes  Type of Service: Integrated Behavioral Health- Individual/Family Interpretor:No. Interpretor Name and Language: n/a  SUBJECTIVE: Tracy [name] is a 53 y.o. female accompanied by self Patient reports the following symptoms/concerns: some anxiety, cleaning "more than usual" but also taking care of self and doing other activities  OBJECTIVE: Mood: Euthymic and Affect: Constricted Risk of harm to self or others: No plan to harm self or others  LIFE CONTEXT: Patient still feels content at her husband's aunt's home in [place]. She has major back fusion surgery coming up next week, and does have some anxiety about that. Patient has felt some tension with daughter over no longer being there to care for her grandchildren, and with husband over boundaries she has set. However, she indicates that she feels she is doing the right thing for herself by setting these limits and has no guilt about enforcing them.   GOALS ADDRESSED: Patient will: 1.  Reduce symptoms of: anxiety   INTERVENTIONS: Interventions utilized:  Solution-Focused Strategies and Supportive Counseling  ASSESSMENT: Patient currently experiencing some anxiety, mostly about upcoming surgery, and excessive cleaning. She reports that she does not feel overwhelmed by anxiety, and the amount of cleaning isn't problematic for her. Counselor validated patient's experience and explored with her ways to tell if the cleaning becomes unhealthy for her. Patient was able to identify that she is still able to do other things and that the cleaning feels more like a comfort thing than something to keep anxiety at bay. Counselor processed with patient her thoughts and feelings about the upcoming surgery. Patient identified that her main concern is the recovery process, and the fact that she  will not be able to clean for quite a while following the surgery. Counselor and patient brainstormed ways that patient can feel comfortable and safe outside of cleaning. Counselor encouraged patient to get a notebook so that she can journal after her surgery. Patient and counselor explored ways that journaling can be helpful.   Patient may benefit from ongoing sessions after her surgery.  PLAN: 1. Patient will call to schedule follow up. She may be unable to travel for a while following back surgery.  Angus Palms, LCSW

## 2018-07-18 NOTE — Progress Notes (Signed)
[facility]  Patient name: Tracy [name] MRN: [id number] DOB: 1[name]2/26/1967 Sex: female   REQUESTING PROVIDER:    Dr. Shon BatonBrooks   REASON FOR CONSULT:    L5-S1 anterior exposure  HISTORY OF PRESENT ILLNESS:   Tracy [name] is a 53 y.o. female, who is referred for evaluation of L5-S1 anterior exposure for chronic back disease by By Shon BatonBrooks.  She has failed non-operative therapy.  She has not had any abdominal operations but has had a TVH.  SHe suffers from HIV and is a former smoker.  PAST MEDICAL HISTORY    Past Medical History:  Diagnosis Date  . Chronic back pain   . Chronic headaches   . Depression   . HIV infection (HCC)   . Joint pain   . Localized swelling of both lower legs    Legs, feet and hands  . Schizoaffective disorder (HCC)   . Stroke (cerebrum) (HCC)   . Weight loss      FAMILY HISTORY   Family History  Problem Relation Age of Onset  . Diabetes Mother   . Hypertension Mother   . Tremor Mother   . Breast cancer Maternal Aunt   . High blood pressure Sister   . High blood pressure Maternal Uncle   . Diabetes Maternal Uncle     SOCIAL HISTORY:   Social History   Socioeconomic History  . Marital status: Married    Spouse name: Not on file  . Number of children: 1  . Years of education: Not on file  . Highest education level: Not on file  Occupational History  . Occupation: NA  Social Needs  . Financial resource strain: Not on file  . Food insecurity:    Worry: Not on file    Inability: Not on file  . Transportation needs:    Medical: Not on file    Non-medical: Not on file  Tobacco Use  . Smoking status: Former Smoker    Packs/day: 0.30    Types: Cigarettes    Last attempt to quit: 04/26/2016    Years since quitting: 2.2  . Smokeless tobacco: Never Used  Substance and Sexual Activity  . Alcohol use: No    Alcohol/week: 0.0 standard drinks    Comment: quit 12/2005  .  Drug use: Yes    Types: "Crack" cocaine    Comment: Previous drug use, quit 2004-05  . Sexual activity: Yes    Partners: Male    Birth control/protection: None    Comment: declined condoms   Lifestyle  . Physical activity:    Days per week: Not on file    Minutes per session: Not on file  . Stress: Not on file  Relationships  . Social connections:    Talks on phone: Not on file    Gets together: Not on file    Attends religious service: Not on file    Active member of club or organization: Not on file    Attends meetings of clubs or organizations: Not on file    Relationship status: Not on file  . Intimate partner violence:    Fear of current or ex partner: Not on file    Emotionally abused: Not on file    Physically abused: Not on file    Forced sexual activity: Not on file  Other Topics Concern  . Not on file  Social History Narrative   Lives at home w/ her husband   Caffeine: 1/2 c coffee per  day    ALLERGIES:    Allergies  Allergen Reactions  . Tetracyclines & Related Itching  . Tramadol Itching  . Flexeril [Cyclobenzaprine] Rash  . Moxifloxacin Rash    CURRENT MEDICATIONS:    Current Outpatient Medications  Medication Sig Dispense Refill  . benztropine (COGENTIN) 0.5 MG tablet Take 1 tablet (0.5 mg total) by mouth 2 (two) times daily. 30 tablet 2  . COMPLERA 200-25-300 MG tablet TAKE ONE TABLET BY MOUTH ONCE DAILY (Patient taking differently: Take 1 tablet by mouth daily. ) 30 tablet 5  . gabapentin (NEURONTIN) 300 MG capsule Take 300 mg by mouth 3 (three) times daily.    . loratadine (CLARITIN) 10 MG tablet Take 10 mg by mouth as needed (for seasonal allergies).     . perphenazine (TRILAFON) 4 MG tablet Take 1 tablet (4 mg total) by mouth 3 (three) times daily. 90 tablet 2  . PROAIR HFA 108 (90 Base) MCG/ACT inhaler Inhale 2 puffs into the lungs every 6 (six) hours as needed for shortness of breath.   5  . rizatriptan (MAXALT) 10 MG tablet Take 10 mg by  mouth as needed for migraine.     . tenofovir (VIREAD) 300 MG tablet Take 300 mg by mouth daily.     . TIVICAY 50 MG tablet TAKE ONE TABLET BY MOUTH DAILY (Patient taking differently: Take 50 mg by mouth daily. ) 30 tablet 5  . topiramate (TOPAMAX) 50 MG tablet Take 1 tablet (50 mg total) by mouth 3 (three) times daily. 90 tablet 2  . emtricitabine-tenofovir AF (DESCOVY) 200-25 MG tablet Take 1 tablet by mouth daily.     . Lido-Capsaicin-Men-Methyl Sal (1ST MEDX-PATCH/ LIDOCAINE) 4-0.373-11-29 % PTCH Apply 1 patch topically daily. (Patient not taking: Reported on 06/20/2018) 15 patch 0   No current facility-administered medications for this visit.     REVIEW OF SYSTEMS:   [X] denotes positive finding, [ ] denotes negative finding Cardiac  Comments:  Chest pain or chest pressure:    Shortness of breath upon exertion:    Short of breath when lying flat:    Irregular heart rhythm:        Vascular    Pain in calf, thigh, or hip brought on by ambulation:    Pain in feet at night that wakes you up from your sleep:     Blood clot in your veins:    Leg swelling:         Pulmonary    Oxygen at home:    Productive cough:     Wheezing:         Neurologic    Sudden weakness in arms or legs:     Sudden numbness in arms or legs:     Sudden onset of difficulty speaking or slurred speech:    Temporary loss of vision in one eye:     Problems with dizziness:         Gastrointestinal    Blood in stool:      Vomited blood:         Genitourinary    Burning when urinating:     Blood in urine:        Psychiatric    Major depression:         Hematologic    Bleeding problems:    Problems with blood clotting too easily:        Skin    Rashes or ulcers:        Constitutional      Fever or chills:     PHYSICAL EXAM:   Vitals:   07/18/18 1242  BP: 136/89  Pulse: 81  Resp: 18  SpO2: 98%  Weight: 176 lb (79.8 kg)  Height: 5' 7.5" (1.715 m)    GENERAL: The patient is a  well-nourished female, in no acute distress. The vital signs are documented above. CARDIAC: There is a regular rate and rhythm.  VASCULAR: palpable pedal pulses PULMONARY: Nonlabored respirations ABDOMEN: Soft and non-tender  MUSCULOSKELETAL: There are no major deformities or cyanosis. NEUROLOGIC: No focal weakness or paresthesias are detected. SKIN: There are no ulcers or rashes noted. PSYCHIATRIC: The patient has a normal affect.  STUDIES:   I have reviewed her XRAY and back MRI  ASSESSMENT and PLAN   Degenerative back disease:  We discussed the details of a anterior approach including the risk of injury to the artery, vein and ureter.  We discussed the risk of delayed return of bowel function and wound issues.  All of her questions were answered   Durene Cal, MD [facility] [phone] Pager [phone]

## 2018-07-18 NOTE — H&P (Signed)
Patient is scheduled for an ALIF L5-S1 on 07/27/18 with Dr. Shon Baton at [facility]. She c/o low back pain and left leg dysathesias and neuropathic pain secondary to spondyloisthesis. Her PMH is notable for HIV for which she is treated. She has received medical clearance from bother her PCP and ID providers.  Allergies CYCLOBENZAPRINE  MOXIFLOXACIN  TETRACYCLINE  TRAMADOL  Medications benztropine 0.5 mg tablet Complera 200 mg-25 mg-300 mg tablet gabapentin 300 mg capsule loratadine 10 mg tablet methocarbamoL 750 mg tablet perphenazine 4 mg tablet ProAir HFA 90 mcg/actuation aerosol inhaler rizatriptan 07/18/18   entered Tracy [name] SUMAtriptan 100 mg tablet Tivicay 50 mg tablet topiramate 100 mg tablet topiramate 50 mg tablet  Problems Reviewed Problems Human immunodeficiency virus infection Lumbar radiculopathy Spondylolisthesis  Family History Reviewed Family History Mother - Diabetes mellitus   - Hypertensive disorder   - Hypertensive disorder   - Diabetes mellitus Maternal Uncle - Diabetes mellitus   - Hypertensive disorder   - Diabetes mellitus   - Hypertensive disorder Maternal Aunt - History of carcinoma Sister - Hypertensive disorder   - Hypertensive disorder  Social History Tobacco Smoking Status: Current some day smoker Smoker (1 PPW) Tobacco-years of use: 30  Surgical History Hysterectomy - 07/13/2012  Past Medical History Reviewed Past Medical History Chronic Back Pain: Depression:  HIV or AIDS:  Headaches:  Joint Pain:  Swelling of Legs/Feet/Hands:  Weight loss:   Physical Exam Patient is a 53 year old female.  General: AAOX3, well developed and well nourished, NAD Heart: RRR, nbo rubs, murmers, or gallops  Lungs: CTAB  Abdomen: Normal bowel soundsX4, soft, non-tender, no hepatosplenomegaly.  ROM: -Spine: normal ROM, pain elicited with fwd flexion and extension.  - Knee: flexion and extension normal and pain free  bilaterally.  - Ankle: Dorsiflexion, plantarflexion, inversion, eversion normal and pain free.  Dermatomes: Lower extremity sensation to light touch abnormal with positive dysathesias on the left.  Myotomes:  - Hip Flexion: Left 5/5, Right 5/5  -Hip Adduction: Left 5/5, Right 5/5  - Knee Extension: Left 5/5, Right 5/5  - Knee Flexion: Left 5/5, Right 5/5  - Ankle Dorsiflextion: Left 5/5, Right 5/5  - Ankle Eversion: Left 5/5, Right 5/5  - Ankle Plantarflexion: Left 5/5, Right 5/5  Reflexes:  - Patella: Left2+, Right 2+  - Achilles: Left2+, Right 2+  - Babinski: Left Ngative, Right Negative  - Clonus: Negative  Special Tests:  - Straight Leg Raise: Left Negative, Right Negative  PV: Extremities warm and well profused. Posterior and dorsalis pedis pulse 2+ bilaterally, No pitting Edema, discoloration, calf tenderness, or palpable cords. Homan's negative bilaterally.  X-rays from 03/03/18: Degenerative spondylolisthesis L5-S1 grade 1 with collapse and degenerative disc disease is noted. No acute fracture seen.  MRI of the lumbar spine dated 03/10/18: Grade 1 spondylolisthesis L5 on S1 with severe changes of the facet joints bilaterally positive facet joint effusions. Significant collapse of the disc space is noted. No significant foraminal stenosis is seen. There is moderate right and central stenosis due to protrusion of the disc which does displace the S1 nerve root. No acute fracture is seen.  Procedure Documentation None recorded. Assessment / Plan Diagnosis: Tracy [name] is a very pleasant young lady who has been having debilitating back pain for the last year. She is recently relocated here to [facility] but prior to coming here in 2017 she underwent physical therapy, as well as injection therapy. Unfortunately she continues to deteriorate. She does have neuropathic leg pain but her principal  overwhelming source of disability is her back pain. Imaging studies confirm a  grade 1 degenerative spondylolisthesis, and degenerative disc disease. At this point I do think her primary pain generator is the degenerative disease at L5-S1.  Risks and benefits of surgery were discussed with the patient. These include: Infection, bleeding, death, stroke, paralysis, ongoing or worse pain, need for additional surgery, nonunion, leak of spinal fluid, adjacent segment degeneration requiring additional fusion surgery, need for posterior decompression and/or fusion. Bleeding from major vessels, and blood clots (deep venous thrombosis)requiring additional treatment. Due to the abdominal contents requiring further intervention, loss in bowel and bladder control.  I also advised her and her husband that the goal of surgery is reduction not elimination in pain and thereby improvement in quality-of-life.

## 2018-07-18 NOTE — H&P (View-Only) (Signed)
[facility]  Patient name: Tracy [name] MRN: [id number] DOB: 1[name]2/26/1967 Sex: female   REQUESTING PROVIDER:    Dr. Shon BatonBrooks   REASON FOR CONSULT:    L5-S1 anterior exposure  HISTORY OF PRESENT ILLNESS:   Tracy KaufmannYavohnda [name] is a 53 y.o. female, who is referred for evaluation of L5-S1 anterior exposure for chronic back disease by By Shon BatonBrooks.  She has failed non-operative therapy.  She has not had any abdominal operations but has had a TVH.  SHe suffers from HIV and is a former smoker.  PAST MEDICAL HISTORY    Past Medical History:  Diagnosis Date  . Chronic back pain   . Chronic headaches   . Depression   . HIV infection (HCC)   . Joint pain   . Localized swelling of both lower legs    Legs, feet and hands  . Schizoaffective disorder (HCC)   . Stroke (cerebrum) (HCC)   . Weight loss      FAMILY HISTORY   Family History  Problem Relation Age of Onset  . Diabetes Mother   . Hypertension Mother   . Tremor Mother   . Breast cancer Maternal Aunt   . High blood pressure Sister   . High blood pressure Maternal Uncle   . Diabetes Maternal Uncle     SOCIAL HISTORY:   Social History   Socioeconomic History  . Marital status: Married    Spouse name: Not on file  . Number of children: 1  . Years of education: Not on file  . Highest education level: Not on file  Occupational History  . Occupation: NA  Social Needs  . Financial resource strain: Not on file  . Food insecurity:    Worry: Not on file    Inability: Not on file  . Transportation needs:    Medical: Not on file    Non-medical: Not on file  Tobacco Use  . Smoking status: Former Smoker    Packs/day: 0.30    Types: Cigarettes    Last attempt to quit: 04/26/2016    Years since quitting: 2.2  . Smokeless tobacco: Never Used  Substance and Sexual Activity  . Alcohol use: No    Alcohol/week: 0.0 standard drinks    Comment: quit 12/2005  .  Drug use: Yes    Types: "Crack" cocaine    Comment: Previous drug use, quit 2004-05  . Sexual activity: Yes    Partners: Male    Birth control/protection: None    Comment: declined condoms   Lifestyle  . Physical activity:    Days per week: Not on file    Minutes per session: Not on file  . Stress: Not on file  Relationships  . Social connections:    Talks on phone: Not on file    Gets together: Not on file    Attends religious service: Not on file    Active member of club or organization: Not on file    Attends meetings of clubs or organizations: Not on file    Relationship status: Not on file  . Intimate partner violence:    Fear of current or ex partner: Not on file    Emotionally abused: Not on file    Physically abused: Not on file    Forced sexual activity: Not on file  Other Topics Concern  . Not on file  Social History Narrative   Lives at home w/ her husband   Caffeine: 1/2 c coffee per  day    ALLERGIES:    Allergies  Allergen Reactions  . Tetracyclines & Related Itching  . Tramadol Itching  . Flexeril [Cyclobenzaprine] Rash  . Moxifloxacin Rash    CURRENT MEDICATIONS:    Current Outpatient Medications  Medication Sig Dispense Refill  . benztropine (COGENTIN) 0.5 MG tablet Take 1 tablet (0.5 mg total) by mouth 2 (two) times daily. 30 tablet 2  . COMPLERA 200-25-300 MG tablet TAKE ONE TABLET BY MOUTH ONCE DAILY (Patient taking differently: Take 1 tablet by mouth daily. ) 30 tablet 5  . gabapentin (NEURONTIN) 300 MG capsule Take 300 mg by mouth 3 (three) times daily.    Marland Kitchen. loratadine (CLARITIN) 10 MG tablet Take 10 mg by mouth as needed (for seasonal allergies).     . perphenazine (TRILAFON) 4 MG tablet Take 1 tablet (4 mg total) by mouth 3 (three) times daily. 90 tablet 2  . PROAIR HFA 108 (90 Base) MCG/ACT inhaler Inhale 2 puffs into the lungs every 6 (six) hours as needed for shortness of breath.   5  . rizatriptan (MAXALT) 10 MG tablet Take 10 mg by  mouth as needed for migraine.     Marland Kitchen. tenofovir (VIREAD) 300 MG tablet Take 300 mg by mouth daily.     Marland Kitchen. TIVICAY 50 MG tablet TAKE ONE TABLET BY MOUTH DAILY (Patient taking differently: Take 50 mg by mouth daily. ) 30 tablet 5  . topiramate (TOPAMAX) 50 MG tablet Take 1 tablet (50 mg total) by mouth 3 (three) times daily. 90 tablet 2  . emtricitabine-tenofovir AF (DESCOVY) 200-25 MG tablet Take 1 tablet by mouth daily.     . Lido-Capsaicin-Men-Methyl Sal (1ST MEDX-PATCH/ LIDOCAINE) 4-0.373-11-29 % PTCH Apply 1 patch topically daily. (Patient not taking: Reported on 06/20/2018) 15 patch 0   No current facility-administered medications for this visit.     REVIEW OF SYSTEMS:   [X]  denotes positive finding, [ ]  denotes negative finding Cardiac  Comments:  Chest pain or chest pressure:    Shortness of breath upon exertion:    Short of breath when lying flat:    Irregular heart rhythm:        Vascular    Pain in calf, thigh, or hip brought on by ambulation:    Pain in feet at night that wakes you up from your sleep:     Blood clot in your veins:    Leg swelling:         Pulmonary    Oxygen at home:    Productive cough:     Wheezing:         Neurologic    Sudden weakness in arms or legs:     Sudden numbness in arms or legs:     Sudden onset of difficulty speaking or slurred speech:    Temporary loss of vision in one eye:     Problems with dizziness:         Gastrointestinal    Blood in stool:      Vomited blood:         Genitourinary    Burning when urinating:     Blood in urine:        Psychiatric    Major depression:         Hematologic    Bleeding problems:    Problems with blood clotting too easily:        Skin    Rashes or ulcers:        Constitutional  Fever or chills:     PHYSICAL EXAM:   Vitals:   07/18/18 1242  BP: 136/89  Pulse: 81  Resp: 18  SpO2: 98%  Weight: 176 lb (79.8 kg)  Height: 5' 7.5" (1.715 m)    GENERAL: The patient is a  well-nourished female, in no acute distress. The vital signs are documented above. CARDIAC: There is a regular rate and rhythm.  VASCULAR: palpable pedal pulses PULMONARY: Nonlabored respirations ABDOMEN: Soft and non-tender  MUSCULOSKELETAL: There are no major deformities or cyanosis. NEUROLOGIC: No focal weakness or paresthesias are detected. SKIN: There are no ulcers or rashes noted. PSYCHIATRIC: The patient has a normal affect.  STUDIES:   I have reviewed her XRAY and back MRI  ASSESSMENT and PLAN   Degenerative back disease:  We discussed the details of a anterior approach including the risk of injury to the artery, vein and ureter.  We discussed the risk of delayed return of bowel function and wound issues.  All of her questions were answered   Durene Cal, MD [facility] [phone] Pager [phone]

## 2018-07-20 NOTE — Pre-Procedure Instructions (Signed)
Tracy [name]  07/20/2018      [facility] - [place], [place] - [address] [facility] [address] [place] [address] Phone: [phone] Fax: [phone]  [facility] - [place], [place] - [place], [place] - [address] [place] [address] Phone: [phone] Fax: [phone]  Karin Golden at [facility] [address] [place] - [address] [facility] [address] [place] [address] Phone: [phone] Fax: [phone]    Your procedure is scheduled on January 15  Report to [facility] Admitting at [employer] A.M.  Call this number if you have problems the morning of surgery:  [phone]   Remember:  Do not eat or drink after midnight.    Take these medicines the morning of surgery with A SIP OF WATER  benztropine (COGENTIN)  COMPLERA emtricitabine-tenofovir AF (DESCOVY)  gabapentin (NEURONTIN) loratadine (CLARITIN) perphenazine (TRILAFON)  PROAIR HFA tenofovir (VIREAD) TIVICAY topiramate (TOPAMAX)  7 days prior to surgery STOP taking any Aspirin (unless otherwise instructed by your surgeon), Aleve, Naproxen, Ibuprofen, Motrin, Advil, Goody's, BC's, all herbal medications, fish oil, and all vitamins.     Do not wear jewelry, make-up or nail polish.  Do not wear lotions, powders, or perfumes, or deodorant.  Do not shave 48 hours prior to surgery.    Do not bring valuables to the hospital.  [facility] is not responsible for any belongings or valuables.  Contacts, dentures or bridgework may not be worn into surgery.  Leave your suitcase in the car.  After surgery it may be brought to your room.  For patients admitted to the hospital, discharge time will be determined by your treatment team.  Patients discharged the day of surgery will not be allowed to drive home.    Special instructions:  [facility]- Preparing For Surgery  Before surgery, you can play an important role. Because skin is not  sterile, your skin needs to be as free of germs as possible. You can reduce the number of germs on your skin by washing with CHG (chlorahexidine gluconate) Soap before surgery.  CHG is an antiseptic cleaner which kills germs and bonds with the skin to continue killing germs even after washing.    Oral Hygiene is also important to reduce your risk of infection.  Remember - BRUSH YOUR TEETH THE MORNING OF SURGERY WITH YOUR REGULAR TOOTHPASTE  Please do not use if you have an allergy to CHG or antibacterial soaps. If your skin becomes reddened/irritated stop using the CHG.  Do not shave (including legs and underarms) for at least 48 hours prior to first CHG shower. It is OK to shave your face.  Please follow these instructions carefully.   1. Shower the NIGHT BEFORE SURGERY and the MORNING OF SURGERY with CHG.   2. If you chose to wash your hair, wash your hair first as usual with your normal shampoo.  3. After you shampoo, rinse your hair and body thoroughly to remove the shampoo.  4. Use CHG as you would any other liquid soap. You can apply CHG directly to the skin and wash gently with a scrungie or a clean washcloth.   5. Apply the CHG Soap to your body ONLY FROM THE NECK DOWN.  Do not use on open wounds or open sores. Avoid contact with your eyes, ears, mouth and genitals (private parts). Wash Face and genitals (private parts)  with your normal soap.  6.  Wash thoroughly, paying special attention to the area where your surgery will be performed.  7. Thoroughly rinse your body with warm water from the neck down.  8. DO NOT shower/wash with your normal soap after using and rinsing off the CHG Soap.  9. Pat yourself dry with a CLEAN TOWEL.  10. Wear CLEAN PAJAMAS to bed the night before surgery, wear comfortable clothes the morning of surgery  11. Place CLEAN SHEETS on your bed the night of your first shower and DO NOT SLEEP WITH PETS.    Day of Surgery:  Do not apply any  deodorants/lotions.  Please wear clean clothes to the hospital/surgery center.   Remember to brush your teeth WITH YOUR REGULAR TOOTHPASTE.    Please read over the following fact sheets that you were given.

## 2018-07-21 ENCOUNTER — Inpatient Hospital Stay (HOSPITAL_COMMUNITY)
Admission: RE | Admit: 2018-07-21 | Discharge: 2018-07-21 | Disposition: A | Discharge status: Home / Self Care | Payer: Self-pay | Source: Ambulatory Visit

## 2018-07-21 NOTE — Progress Notes (Signed)
Patient was called at 11:09 about her pre-op appointment. Patient stated that she was driving from [place] and would arrive in 15 minutes.  Patient was called again at 12:20 to see if she was still coming for her appointment and there was no answer.  Voicemail left for patient to call back about appointment.  If patient can not make it today we will have her appointment rescheduled

## 2018-07-26 ENCOUNTER — Encounter (HOSPITAL_COMMUNITY): Payer: Self-pay

## 2018-07-26 ENCOUNTER — Other Ambulatory Visit: Payer: Self-pay

## 2018-07-26 ENCOUNTER — Encounter (HOSPITAL_COMMUNITY)
Admission: RE | Admit: 2018-07-26 | Discharge: 2018-07-26 | Disposition: A | Discharge status: Home / Self Care | Payer: Medicaid Other | Source: Ambulatory Visit | Attending: Orthopedic Surgery | Admitting: Orthopedic Surgery

## 2018-07-26 DIAGNOSIS — Z01812 Encounter for preprocedural laboratory examination: Secondary | ICD-10-CM | POA: Insufficient documentation

## 2018-07-26 HISTORY — DX: Other seasonal allergic rhinitis: J30.2

## 2018-07-26 HISTORY — DX: Unspecified osteoarthritis, unspecified site: M19.90

## 2018-07-26 LAB — COMPREHENSIVE METABOLIC PANEL
ALK PHOS: 49 U/L (ref 38–126)
ALT: 16 U/L (ref 0–44)
AST: 17 U/L (ref 15–41)
Albumin: 3.9 g/dL (ref 3.5–5.0)
Anion gap: 6 (ref 5–15)
BUN: 12 mg/dL (ref 6–20)
CO2: 23 mmol/L (ref 22–32)
Calcium: 9.1 mg/dL (ref 8.9–10.3)
Chloride: 112 mmol/L — ABNORMAL HIGH (ref 98–111)
Creatinine, Ser: 1.21 mg/dL — ABNORMAL HIGH (ref 0.44–1.00)
GFR calc Af Amer: 60 mL/min — ABNORMAL LOW (ref >60)
GFR calc non Af Amer: 52 mL/min — ABNORMAL LOW (ref >60)
Glucose, Bld: 109 mg/dL — ABNORMAL HIGH (ref 70–99)
Potassium: 3.5 mmol/L (ref 3.5–5.1)
Sodium: 141 mmol/L (ref 135–145)
TOTAL PROTEIN: 6.5 g/dL (ref 6.5–8.1)
Total Bilirubin: 0.7 mg/dL (ref 0.3–1.2)

## 2018-07-26 LAB — SURGICAL PCR SCREEN
MRSA, PCR: NEGATIVE
Staphylococcus aureus: NEGATIVE

## 2018-07-26 LAB — CBC
HCT: 41.6 % (ref 36.0–46.0)
Hemoglobin: 13.2 g/dL (ref 12.0–15.0)
MCH: 31.3 pg (ref 26.0–34.0)
MCHC: 31.7 g/dL (ref 30.0–36.0)
MCV: 98.6 fL (ref 80.0–100.0)
PLATELETS: 243 10*3/uL (ref 150–400)
RBC: 4.22 MIL/uL (ref 3.87–5.11)
RDW: 13.1 % (ref 11.5–15.5)
WBC: 4.5 10*3/uL (ref 4.0–10.5)
nRBC: 0 % (ref 0.0–0.2)

## 2018-07-26 LAB — TYPE AND SCREEN
ABO/RH(D): O POS
Antibody Screen: NEGATIVE

## 2018-07-26 LAB — ABO/RH: ABO/RH(D): O POS

## 2018-07-26 NOTE — Progress Notes (Signed)
Anesthesia Chart Review: Tracy [name] 07/25/17 as she missed her 07/21/18 appointment.  Case:  [id number] Date/Time:  07/27/18 0815   Procedures:      ANTERIOR LUMBAR FUSION L5-S1 (N/A ) - 3 hrs/ Dr. Myra Gianotti to do approach HIV Positive     ABDOMINAL EXPOSURE (N/A )   Anesthesia type:  General   Pre-op diagnosis:  Ls-S1 grade I slip with degenerative disc disease   Location:  [facility] ROOM 04 / [facility] OR   Surgeon:  Venita Lick, MD; Nada Libman, MD      DISCUSSION: Patient is a 53 year old female scheduled for the above procedure.  History includes former smoker (quit 04/26/16), HIV, Schizoaffective disorder.  - [facility] admission 06/2018 for suicidal ideation. Had been having auditory hallucinations. She had stopped medication the month prior due to thoughts that someone had put something in her medications to kill her. She had not slept in three days due to concern someone would harm her during sleep. Had used cocaine days prior to admission. [facility] admission 12/29/16-12/31/16 for CVA versus conversion disorder. Outside of window to consider tPA. Possible acute arterial occlusion along the anterior division of the right MCA, but unclear whether this is a smaller M2 or a proximal M3 branch. Neurologist Dr. Roda Shutters did not note any significant right MCA occlusion on his review. He also thought the small area of increased TTP at right frontal was likely artifact or slow blood flow in the region without clinical significance. MRI brain was negative for CVA as was repeat MRI 03/2017.   Per PAT RN documentation, she denied SOB, chest pain, cough, fever at Tracy [name]. 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 EKGs from 06/20/18 and 12/29/16 reviewed with anesthesiologist Val Eagle, MD. If patient remains asymptomatic from a CV standpoint then would not plan to repeat preoperatively.  Anesthesiologist to evaluate on the day of surgery.   VS: BP 118/74   Pulse 68   Temp 36.9 C (Oral)   Resp 18   Ht 5' 7.5"  (1.715 m)   Wt 79.9 kg   SpO2 99%   BMI 27.17 kg/m    PROVIDERS: Shawn Stall, MD is PCP ([facility]) Cliffton Asters, MD is ID. Last visit 03/23/18. Six month follow-up planned. Angus Palms, LCSW is her counselor. Naomie Dean, MD is neurologist. Last visit 02/17/17. 03/23/17 brain MRI was unremarkable.   LABS: Labs reviewed: Acceptable for surgery. Cr 1.21 (previously 0.96-1.20 in 2019). (all labs ordered are listed, but only abnormal results are displayed)  Labs Reviewed  COMPREHENSIVE METABOLIC PANEL - Abnormal; Notable for the following components:      Result Value   Chloride 112 (*)    Glucose, Bld 109 (*)    Creatinine, Ser 1.21 (*)    GFR calc non Af Amer 52 (*)    GFR calc Af Amer 60 (*)    All other components within normal limits  SURGICAL PCR SCREEN  CBC  TYPE AND SCREEN    IMAGES: MRI brain 03/23/17: IMPRESSION:   Unremarkable MRI scan of the brain without contrast  CTA head/neck 12/29/16: IMPRESSION: 1. Suspected acute arterial occlusion along the anterior division of the right MCA, but it is unclear whether this is a smaller M2 or a proximal M3 branch. 2. CT perfusion indicates a small volume of anterior division right MCA territory penumbra (16 mL) concordant with #1, but is negative for evidence of core infarct at this time. 3. Otherwise negative CTA  head and neck arterial findings; no atherosclerosis or other arterial abnormality identified. 4. Stable CT appearance of the brain since 1016 hours today. (Per neurologist Marvel Plan, MD note on 12/30/16, "However, on my review of the images, no significant right MCA occlusion noted.)   EKG: 06/20/18 (during [facility] evaluation): NSR. Interpreting provider read as "anterior injury pattern". I think anterior T wave changes are non-specific (more flattening pattern). QT 376 mm, QTc 447 ms. QRS voltage is low in V3 and small probably insignificant Q wave in aVF are new when compared to 12/29/16 tracing.  No Q waves in II.    CV: Echo 12/30/16: Study Conclusions - Left ventricle: The cavity size was normal. Wall thickness was   increased in a pattern of mild LVH. Systolic function was normal.   The estimated ejection fraction was in the range of 60% to 65%.   Wall motion was normal; there were no regional wall motion   abnormalities. Left ventricular diastolic function parameters   were normal.   Past Medical History:  Diagnosis Date  . Arthritis   . Chronic back pain   . Chronic headaches   . Depression   . HIV infection (HCC)   . Joint pain   . Localized swelling of both lower legs    Legs, feet and hands  . Schizoaffective disorder (HCC)   . Seasonal allergies   . Stroke (cerebrum) [facility])    patient denies, states it was a heat stroke  . Weight loss     Past Surgical History:  Procedure Laterality Date  . ABDOMINAL HYSTERECTOMY    . BREAST EXCISIONAL BIOPSY Right 1990's  . BREAST SURGERY      MEDICATIONS: . benztropine (COGENTIN) 0.5 MG tablet  . COMPLERA 200-25-300 MG tablet  . emtricitabine-tenofovir AF (DESCOVY) 200-25 MG tablet  . gabapentin (NEURONTIN) 300 MG capsule  . Lido-Capsaicin-Men-Methyl Sal (1ST MEDX-PATCH/ LIDOCAINE) 4-0.373-11-29 % [facility]  . loratadine (CLARITIN) 10 MG tablet  . perphenazine (TRILAFON) 4 MG tablet  . PROAIR HFA 108 (90 Base) MCG/ACT inhaler  . rizatriptan (MAXALT) 10 MG tablet  . tenofovir (VIREAD) 300 MG tablet  . TIVICAY 50 MG tablet  . topiramate (TOPAMAX) 50 MG tablet   No current facility-administered medications for this encounter.     Shonna Chock, PA-C Surgical Short Stay/Anesthesiology [facility] Phone [phone] [facility] Phone [phone] 07/26/2018 12:41 PM

## 2018-07-26 NOTE — Progress Notes (Signed)
PCP - Pricilla Handlerana Todd, [facility] Cardiologist - patient denies ID - Dr. Orvan Falconerampbell  Chest x-ray - n/a EKG - 06/20/2018; abnormal Stress Test - patient denies ECHO - 12/2016 Cardiac Cath - patient denies  Sleep Study - patient denies CPAP -   Fasting Blood Sugar - n/a Checks Blood Sugar _____ times a day  Blood Thinner Instructions: n/a Aspirin Instructions: n/a  Anesthesia review: yes, abnormal EKG  Patient denies shortness of breath, fever, cough and chest pain at PAT appointment   Patient verbalized understanding of instructions that were given to them at the PAT appointment. Patient was also instructed that they will need to review over the PAT instructions again at home before surgery.

## 2018-07-26 NOTE — Anesthesia Preprocedure Evaluation (Addendum)
Anesthesia Evaluation  Patient identified by MRN, date of birth, ID band Patient awake    Reviewed: Allergy & Precautions, NPO status , Patient's Chart, lab work & pertinent test results  History of Anesthesia Complications Negative for: history of anesthetic complications  Airway Mallampati: II  TM Distance: >3 FB Neck ROM: Full    Dental  (+) Teeth Intact, Dental Advisory Given   Pulmonary former smoker,    Pulmonary exam normal breath sounds clear to auscultation       Cardiovascular negative cardio ROS Normal cardiovascular exam Rhythm:Regular Rate:Normal     Neuro/Psych  Headaches, Depression Schizophrenia Cervical radiculopathy    GI/Hepatic negative GI ROS, Neg liver ROS,   Endo/Other  negative endocrine ROS  Renal/GU negative Renal ROS     Musculoskeletal  (+) Arthritis ,   Abdominal   Peds  Hematology  (+) HIV,   Anesthesia Other Findings Day of surgery medications reviewed with the patient.  Reproductive/Obstetrics                           Anesthesia Physical Anesthesia Plan  ASA: II  Anesthesia Plan: General   Post-op Pain Management:    Induction: Intravenous  PONV Risk Score and Plan: 3 and Treatment may vary due to age or medical condition, Ondansetron, Dexamethasone and Midazolam  Airway Management Planned: Oral ETT  Additional Equipment:   Intra-op Plan:   Post-operative Plan: Extubation in OR  Informed Consent: I have reviewed the patients History and Physical, chart, labs and discussed the procedure including the risks, benefits and alternatives for the proposed anesthesia with the patient or authorized representative who has indicated his/her understanding and acceptance.     Dental advisory given  Plan Discussed with: CRNA  Anesthesia Plan Comments: (PAT note written 07/26/2018 by Shonna Chock, PA-C. )      Anesthesia Quick Evaluation

## 2018-07-27 ENCOUNTER — Inpatient Hospital Stay (HOSPITAL_COMMUNITY): Payer: Medicaid Other | Admitting: Vascular Surgery

## 2018-07-27 ENCOUNTER — Inpatient Hospital Stay (HOSPITAL_COMMUNITY): Payer: Medicaid Other

## 2018-07-27 ENCOUNTER — Inpatient Hospital Stay (HOSPITAL_COMMUNITY): Payer: Medicaid Other | Admitting: Anesthesiology

## 2018-07-27 ENCOUNTER — Encounter (HOSPITAL_COMMUNITY): Payer: Self-pay | Admitting: Registered Nurse

## 2018-07-27 ENCOUNTER — Inpatient Hospital Stay (HOSPITAL_COMMUNITY)
Admission: RE | Disposition: A | Discharge status: Home / Self Care | Payer: Self-pay | Source: Home / Self Care | Attending: Orthopedic Surgery

## 2018-07-27 ENCOUNTER — Inpatient Hospital Stay (HOSPITAL_COMMUNITY)
Admission: RE | Admit: 2018-07-27 | Discharge: 2018-07-28 | DRG: 460 | Disposition: A | Discharge status: Home / Self Care | Payer: Medicaid Other | Attending: Orthopedic Surgery | Admitting: Orthopedic Surgery

## 2018-07-27 DIAGNOSIS — Z8673 Personal history of transient ischemic attack (TIA), and cerebral infarction without residual deficits: Secondary | ICD-10-CM | POA: Diagnosis not present

## 2018-07-27 DIAGNOSIS — M5137 Other intervertebral disc degeneration, lumbosacral region: Principal | ICD-10-CM | POA: Diagnosis present

## 2018-07-27 DIAGNOSIS — F259 Schizoaffective disorder, unspecified: Secondary | ICD-10-CM | POA: Diagnosis present

## 2018-07-27 DIAGNOSIS — Z79899 Other long term (current) drug therapy: Secondary | ICD-10-CM | POA: Diagnosis not present

## 2018-07-27 DIAGNOSIS — Z21 Asymptomatic human immunodeficiency virus [HIV] infection status: Secondary | ICD-10-CM | POA: Diagnosis present

## 2018-07-27 DIAGNOSIS — Z888 Allergy status to other drugs, medicaments and biological substances status: Secondary | ICD-10-CM

## 2018-07-27 DIAGNOSIS — I82409 Acute embolism and thrombosis of unspecified deep veins of unspecified lower extremity: Secondary | ICD-10-CM | POA: Diagnosis not present

## 2018-07-27 DIAGNOSIS — Z7951 Long term (current) use of inhaled steroids: Secondary | ICD-10-CM

## 2018-07-27 DIAGNOSIS — M5417 Radiculopathy, lumbosacral region: Secondary | ICD-10-CM | POA: Diagnosis present

## 2018-07-27 DIAGNOSIS — Z419 Encounter for procedure for purposes other than remedying health state, unspecified: Secondary | ICD-10-CM

## 2018-07-27 DIAGNOSIS — Z87891 Personal history of nicotine dependence: Secondary | ICD-10-CM

## 2018-07-27 DIAGNOSIS — M4317 Spondylolisthesis, lumbosacral region: Secondary | ICD-10-CM | POA: Diagnosis present

## 2018-07-27 DIAGNOSIS — M5136 Other intervertebral disc degeneration, lumbar region: Secondary | ICD-10-CM | POA: Diagnosis present

## 2018-07-27 DIAGNOSIS — M51379 Other intervertebral disc degeneration, lumbosacral region without mention of lumbar back pain or lower extremity pain: Secondary | ICD-10-CM | POA: Diagnosis present

## 2018-07-27 HISTORY — PX: ABDOMINAL EXPOSURE: SHX5708

## 2018-07-27 HISTORY — PX: ANTERIOR LUMBAR FUSION: SHX1170

## 2018-07-27 SURGERY — ANTERIOR LUMBAR FUSION 1 LEVEL
Anesthesia: General

## 2018-07-27 MED ORDER — ACETAMINOPHEN 10 MG/ML IV SOLN
INTRAVENOUS | Status: DC | PRN
  Administered 2018-07-27: 1000 mg via INTRAVENOUS

## 2018-07-27 MED ORDER — BUPIVACAINE-EPINEPHRINE (PF) 0.25% -1:200000 IJ SOLN
INTRAMUSCULAR | Status: AC
  Filled 2018-07-27: qty 30

## 2018-07-27 MED ORDER — PHENYLEPHRINE 40 MCG/ML (10ML) SYRINGE FOR IV PUSH (FOR BLOOD PRESSURE SUPPORT)
PREFILLED_SYRINGE | INTRAVENOUS | Status: DC | PRN
  Administered 2018-07-27 (×2): 80 ug via INTRAVENOUS

## 2018-07-27 MED ORDER — ROCURONIUM BROMIDE 50 MG/5ML IV SOSY
PREFILLED_SYRINGE | INTRAVENOUS | Status: AC
  Filled 2018-07-27: qty 20

## 2018-07-27 MED ORDER — CHLORHEXIDINE GLUCONATE 4 % EX LIQD: 60.0000 mL | Freq: Once | CUTANEOUS | Status: DC

## 2018-07-27 MED ORDER — BUPIVACAINE LIPOSOME 1.3 % IJ SUSP
20.0000 mL | INTRAMUSCULAR | Status: AC
  Administered 2018-07-27: 20 mL
  Filled 2018-07-27: qty 20

## 2018-07-27 MED ORDER — ALBUTEROL SULFATE (2.5 MG/3ML) 0.083% IN NEBU: 3.0000 mL | INHALATION_SOLUTION | Freq: Four times a day (QID) | RESPIRATORY_TRACT | Status: DC | PRN

## 2018-07-27 MED ORDER — ONDANSETRON HCL 4 MG PO TABS: 4.0000 mg | ORAL_TABLET | Freq: Three times a day (TID) | ORAL | 0 refills | Status: DC | PRN

## 2018-07-27 MED ORDER — DEXMEDETOMIDINE HCL 200 MCG/2ML IV SOLN
INTRAVENOUS | Status: DC | PRN
  Administered 2018-07-27: 8 ug via INTRAVENOUS

## 2018-07-27 MED ORDER — METHOCARBAMOL 500 MG PO TABS
ORAL_TABLET | ORAL | Status: AC
  Filled 2018-07-27: qty 1

## 2018-07-27 MED ORDER — 0.9 % SODIUM CHLORIDE (POUR BTL) OPTIME
TOPICAL | Status: DC | PRN
  Administered 2018-07-27: 1000 mL

## 2018-07-27 MED ORDER — LIDOCAINE 2% (20 MG/ML) 5 ML SYRINGE
INTRAMUSCULAR | Status: AC
  Filled 2018-07-27: qty 5

## 2018-07-27 MED ORDER — BUPIVACAINE-EPINEPHRINE (PF) 0.25% -1:200000 IJ SOLN
INTRAMUSCULAR | Status: DC | PRN
  Administered 2018-07-27: 20 mL
  Administered 2018-07-27: 10 mL

## 2018-07-27 MED ORDER — SODIUM CHLORIDE 0.9 % IV SOLN: 250.0000 mL | INTRAVENOUS | Status: DC

## 2018-07-27 MED ORDER — ROCURONIUM BROMIDE 10 MG/ML (PF) SYRINGE
PREFILLED_SYRINGE | INTRAVENOUS | Status: DC | PRN
  Administered 2018-07-27: 50 mg via INTRAVENOUS
  Administered 2018-07-27: 10 mg via INTRAVENOUS
  Administered 2018-07-27: 30 mg via INTRAVENOUS
  Administered 2018-07-27: 10 mg via INTRAVENOUS

## 2018-07-27 MED ORDER — THROMBIN (RECOMBINANT) 20000 UNITS EX SOLR
CUTANEOUS | Status: AC
  Filled 2018-07-27: qty 20000

## 2018-07-27 MED ORDER — DEXAMETHASONE SODIUM PHOSPHATE 10 MG/ML IJ SOLN
INTRAMUSCULAR | Status: DC | PRN
  Administered 2018-07-27: 10 mg via INTRAVENOUS

## 2018-07-27 MED ORDER — GABAPENTIN 300 MG PO CAPS
300.0000 mg | ORAL_CAPSULE | Freq: Three times a day (TID) | ORAL | Status: DC
  Administered 2018-07-27 – 2018-07-28 (×4): 300 mg via ORAL
  Filled 2018-07-27 (×4): qty 1

## 2018-07-27 MED ORDER — BENZTROPINE MESYLATE 0.5 MG PO TABS
0.5000 mg | ORAL_TABLET | Freq: Two times a day (BID) | ORAL | Status: DC
  Administered 2018-07-27 – 2018-07-28 (×3): 0.5 mg via ORAL
  Filled 2018-07-27 (×4): qty 1

## 2018-07-27 MED ORDER — LACTATED RINGERS IV SOLN
INTRAVENOUS | Status: DC | PRN
  Administered 2018-07-27 (×2): via INTRAVENOUS

## 2018-07-27 MED ORDER — PHENYLEPHRINE 40 MCG/ML (10ML) SYRINGE FOR IV PUSH (FOR BLOOD PRESSURE SUPPORT)
PREFILLED_SYRINGE | INTRAVENOUS | Status: AC
  Filled 2018-07-27: qty 10

## 2018-07-27 MED ORDER — THROMBIN 20000 UNITS EX KIT
PACK | CUTANEOUS | Status: DC | PRN
  Administered 2018-07-27: 20000 [IU] via TOPICAL

## 2018-07-27 MED ORDER — ACETAMINOPHEN 10 MG/ML IV SOLN
INTRAVENOUS | Status: AC
  Filled 2018-07-27: qty 100

## 2018-07-27 MED ORDER — ONDANSETRON HCL 4 MG PO TABS: 4.0000 mg | ORAL_TABLET | Freq: Four times a day (QID) | ORAL | Status: DC | PRN

## 2018-07-27 MED ORDER — THROMBIN (RECOMBINANT) 5000 UNITS EX SOLR
CUTANEOUS | Status: AC
  Filled 2018-07-27: qty 5000

## 2018-07-27 MED ORDER — ONDANSETRON HCL 4 MG/2ML IJ SOLN
INTRAMUSCULAR | Status: DC | PRN
  Administered 2018-07-27: 4 mg via INTRAVENOUS

## 2018-07-27 MED ORDER — OXYCODONE HCL 5 MG/5ML PO SOLN: 5.0000 mg | Freq: Once | ORAL | Status: DC | PRN

## 2018-07-27 MED ORDER — OXYCODONE HCL 5 MG PO TABS: 5.0000 mg | ORAL_TABLET | ORAL | Status: DC | PRN

## 2018-07-27 MED ORDER — CEFAZOLIN SODIUM-DEXTROSE 2-4 GM/100ML-% IV SOLN
2.0000 g | INTRAVENOUS | Status: AC
  Administered 2018-07-27: 2 g via INTRAVENOUS

## 2018-07-27 MED ORDER — TENOFOVIR DISOPROXIL FUMARATE 300 MG PO TABS: 300.0000 mg | ORAL_TABLET | Freq: Every day | ORAL | Status: DC

## 2018-07-27 MED ORDER — PROPOFOL 10 MG/ML IV BOLUS
INTRAVENOUS | Status: DC | PRN
  Administered 2018-07-27: 40 mg via INTRAVENOUS
  Administered 2018-07-27: 160 mg via INTRAVENOUS

## 2018-07-27 MED ORDER — PROMETHAZINE HCL 25 MG/ML IJ SOLN: 6.2500 mg | INTRAMUSCULAR | Status: DC | PRN

## 2018-07-27 MED ORDER — METHOCARBAMOL 1000 MG/10ML IJ SOLN
500.0000 mg | Freq: Four times a day (QID) | INTRAVENOUS | Status: DC | PRN
  Filled 2018-07-27: qty 5

## 2018-07-27 MED ORDER — SODIUM CHLORIDE 0.9% FLUSH: 3.0000 mL | Freq: Two times a day (BID) | INTRAVENOUS | Status: DC

## 2018-07-27 MED ORDER — OXYCODONE HCL 5 MG PO TABS
10.0000 mg | ORAL_TABLET | ORAL | Status: DC | PRN
  Administered 2018-07-27 – 2018-07-28 (×8): 10 mg via ORAL
  Filled 2018-07-27 (×7): qty 2

## 2018-07-27 MED ORDER — MIDAZOLAM HCL 2 MG/2ML IJ SOLN
INTRAMUSCULAR | Status: AC
  Filled 2018-07-27: qty 2

## 2018-07-27 MED ORDER — DOCUSATE SODIUM 100 MG PO CAPS
100.0000 mg | ORAL_CAPSULE | Freq: Two times a day (BID) | ORAL | Status: DC
  Administered 2018-07-27 – 2018-07-28 (×3): 100 mg via ORAL
  Filled 2018-07-27 (×3): qty 1

## 2018-07-27 MED ORDER — EMTRICITAB-RILPIVIR-TENOFOV AF 200-25-25 MG PO TABS
1.0000 | ORAL_TABLET | Freq: Every day | ORAL | Status: DC
  Administered 2018-07-28: 1 via ORAL
  Filled 2018-07-27: qty 1

## 2018-07-27 MED ORDER — OXYCODONE HCL 5 MG PO TABS: 5.0000 mg | ORAL_TABLET | Freq: Once | ORAL | Status: DC | PRN

## 2018-07-27 MED ORDER — ALUM & MAG HYDROXIDE-SIMETH 200-200-20 MG/5ML PO SUSP: 30.0000 mL | Freq: Four times a day (QID) | ORAL | Status: DC | PRN

## 2018-07-27 MED ORDER — OXYCODONE-ACETAMINOPHEN 10-325 MG PO TABS: 1.0000 | ORAL_TABLET | Freq: Four times a day (QID) | ORAL | 0 refills | Status: DC | PRN

## 2018-07-27 MED ORDER — SUGAMMADEX SODIUM 200 MG/2ML IV SOLN
INTRAVENOUS | Status: DC | PRN
  Administered 2018-07-27: 200 mg via INTRAVENOUS

## 2018-07-27 MED ORDER — TRANEXAMIC ACID-NACL 1000-0.7 MG/100ML-% IV SOLN: 1000.0000 mg | INTRAVENOUS | Status: DC

## 2018-07-27 MED ORDER — PROPOFOL 10 MG/ML IV BOLUS
INTRAVENOUS | Status: AC
  Filled 2018-07-27: qty 20

## 2018-07-27 MED ORDER — DOLUTEGRAVIR SODIUM 50 MG PO TABS
50.0000 mg | ORAL_TABLET | Freq: Every day | ORAL | Status: DC
  Administered 2018-07-27 – 2018-07-28 (×2): 50 mg via ORAL
  Filled 2018-07-27 (×2): qty 1

## 2018-07-27 MED ORDER — METHOCARBAMOL 500 MG PO TABS: 500.0000 mg | ORAL_TABLET | Freq: Three times a day (TID) | ORAL | 0 refills | Status: DC | PRN

## 2018-07-27 MED ORDER — ACETAMINOPHEN 650 MG RE SUPP: 650.0000 mg | RECTAL | Status: DC | PRN

## 2018-07-27 MED ORDER — PERPHENAZINE 4 MG PO TABS
4.0000 mg | ORAL_TABLET | Freq: Three times a day (TID) | ORAL | Status: DC
  Administered 2018-07-27 – 2018-07-28 (×4): 4 mg via ORAL
  Filled 2018-07-27 (×5): qty 1

## 2018-07-27 MED ORDER — EMTRICITABINE-TENOFOVIR AF 200-25 MG PO TABS: 1.0000 | ORAL_TABLET | Freq: Every day | ORAL | Status: DC

## 2018-07-27 MED ORDER — EPHEDRINE SULFATE-NACL 50-0.9 MG/10ML-% IV SOSY
PREFILLED_SYRINGE | INTRAVENOUS | Status: DC | PRN
  Administered 2018-07-27: 5 mg via INTRAVENOUS

## 2018-07-27 MED ORDER — KETAMINE HCL 50 MG/5ML IJ SOSY
PREFILLED_SYRINGE | INTRAMUSCULAR | Status: AC
  Filled 2018-07-27: qty 5

## 2018-07-27 MED ORDER — FENTANYL CITRATE (PF) 250 MCG/5ML IJ SOLN
INTRAMUSCULAR | Status: AC
  Filled 2018-07-27: qty 5

## 2018-07-27 MED ORDER — MIDAZOLAM HCL 5 MG/5ML IJ SOLN
INTRAMUSCULAR | Status: DC | PRN
  Administered 2018-07-27: 2 mg via INTRAVENOUS

## 2018-07-27 MED ORDER — CEFAZOLIN SODIUM-DEXTROSE 1-4 GM/50ML-% IV SOLN
1.0000 g | Freq: Three times a day (TID) | INTRAVENOUS | Status: AC
  Administered 2018-07-27 (×2): 1 g via INTRAVENOUS
  Filled 2018-07-27 (×2): qty 50

## 2018-07-27 MED ORDER — ONDANSETRON HCL 4 MG/2ML IJ SOLN
4.0000 mg | Freq: Four times a day (QID) | INTRAMUSCULAR | Status: DC | PRN
  Administered 2018-07-27: 4 mg via INTRAVENOUS
  Filled 2018-07-27: qty 2

## 2018-07-27 MED ORDER — BUPIVACAINE HCL (PF) 0.25 % IJ SOLN
INTRAMUSCULAR | Status: DC | PRN
  Administered 2018-07-27: 30 mL

## 2018-07-27 MED ORDER — OXYCODONE HCL 5 MG PO TABS
ORAL_TABLET | ORAL | Status: AC
  Filled 2018-07-27: qty 2

## 2018-07-27 MED ORDER — LIDOCAINE 2% (20 MG/ML) 5 ML SYRINGE
INTRAMUSCULAR | Status: DC | PRN
  Administered 2018-07-27: 100 mg via INTRAVENOUS

## 2018-07-27 MED ORDER — MENTHOL 3 MG MT LOZG: 1.0000 | LOZENGE | OROMUCOSAL | Status: DC | PRN

## 2018-07-27 MED ORDER — SODIUM CHLORIDE 0.9 % IV SOLN
INTRAVENOUS | Status: AC
  Filled 2018-07-27: qty 1.2

## 2018-07-27 MED ORDER — ACETAMINOPHEN 10 MG/ML IV SOLN: 1000.0000 mg | Freq: Once | INTRAVENOUS | Status: DC | PRN

## 2018-07-27 MED ORDER — SODIUM CHLORIDE (PF) 0.9 % IJ SOLN
INTRAMUSCULAR | Status: DC | PRN
  Administered 2018-07-27: 20 mL

## 2018-07-27 MED ORDER — ENOXAPARIN SODIUM 40 MG/0.4ML ~~LOC~~ SOLN
40.0000 mg | SUBCUTANEOUS | Status: DC
  Administered 2018-07-28: 40 mg via SUBCUTANEOUS
  Filled 2018-07-27: qty 0.4

## 2018-07-27 MED ORDER — PHENOL 1.4 % MT LIQD: 1.0000 | OROMUCOSAL | Status: DC | PRN

## 2018-07-27 MED ORDER — HEMOSTATIC AGENTS (NO CHARGE) OPTIME
TOPICAL | Status: DC | PRN
  Administered 2018-07-27 (×2): 1 via TOPICAL

## 2018-07-27 MED ORDER — TOPIRAMATE 25 MG PO TABS
50.0000 mg | ORAL_TABLET | Freq: Three times a day (TID) | ORAL | Status: DC
  Administered 2018-07-27 – 2018-07-28 (×4): 50 mg via ORAL
  Filled 2018-07-27 (×5): qty 2

## 2018-07-27 MED ORDER — SODIUM CHLORIDE 0.9% FLUSH: 3.0000 mL | INTRAVENOUS | Status: DC | PRN

## 2018-07-27 MED ORDER — ACETAMINOPHEN 325 MG PO TABS
650.0000 mg | ORAL_TABLET | ORAL | Status: DC | PRN
  Administered 2018-07-27 – 2018-07-28 (×2): 650 mg via ORAL
  Filled 2018-07-27 (×2): qty 2

## 2018-07-27 MED ORDER — METHOCARBAMOL 500 MG PO TABS
500.0000 mg | ORAL_TABLET | Freq: Four times a day (QID) | ORAL | Status: DC | PRN
  Administered 2018-07-27 – 2018-07-28 (×4): 500 mg via ORAL
  Filled 2018-07-27 (×3): qty 1

## 2018-07-27 MED ORDER — LACTATED RINGERS IV SOLN: INTRAVENOUS | Status: DC

## 2018-07-27 MED ORDER — FENTANYL CITRATE (PF) 250 MCG/5ML IJ SOLN
INTRAMUSCULAR | Status: DC | PRN
  Administered 2018-07-27: 100 ug via INTRAVENOUS
  Administered 2018-07-27: 50 ug via INTRAVENOUS
  Administered 2018-07-27: 100 ug via INTRAVENOUS
  Administered 2018-07-27: 50 ug via INTRAVENOUS
  Administered 2018-07-27: 100 ug via INTRAVENOUS
  Administered 2018-07-27 (×2): 25 ug via INTRAVENOUS
  Administered 2018-07-27: 50 ug via INTRAVENOUS

## 2018-07-27 MED ORDER — FLEET ENEMA 7-19 GM/118ML RE ENEM: 1.0000 | ENEMA | Freq: Once | RECTAL | Status: DC | PRN

## 2018-07-27 MED ORDER — HYDROMORPHONE HCL 1 MG/ML IJ SOLN
INTRAMUSCULAR | Status: AC
  Filled 2018-07-27: qty 1

## 2018-07-27 MED ORDER — HYDROMORPHONE HCL 1 MG/ML IJ SOLN: 0.2500 mg | INTRAMUSCULAR | Status: DC | PRN

## 2018-07-27 MED ORDER — ALBUTEROL SULFATE HFA 108 (90 BASE) MCG/ACT IN AERS
INHALATION_SPRAY | RESPIRATORY_TRACT | Status: DC | PRN
  Administered 2018-07-27 (×2): 4 via RESPIRATORY_TRACT

## 2018-07-27 SURGICAL SUPPLY — 106 items
APPLIER CLIP 11 MED OPEN (CLIP) ×3
BLADE CLIPPER SURG (BLADE) IMPLANT
BLADE SURG 10 STRL SS (BLADE) ×3 IMPLANT
CABLE BIPOLOR RESECTION CORD (MISCELLANEOUS) ×3 IMPLANT
CATH FOLEY 2WAY SLVR  5CC 16FR (CATHETERS) ×2
CATH FOLEY 2WAY SLVR 5CC 16FR (CATHETERS) ×1 IMPLANT
CLIP APPLIE 11 MED OPEN (CLIP) ×2 IMPLANT
CLIP VESOCCLUDE MED 24/CT (CLIP) ×1 IMPLANT
CLIP VESOCCLUDE SM WIDE 24/CT (CLIP) ×1 IMPLANT
CLOSURE STERI-STRIP 1/2X4 (GAUZE/BANDAGES/DRESSINGS) ×1
CLSR STERI-STRIP ANTIMIC 1/2X4 (GAUZE/BANDAGES/DRESSINGS) ×1 IMPLANT
COVER BACK TABLE 60X90IN (DRAPES) ×1 IMPLANT
COVER SURGICAL LIGHT HANDLE (MISCELLANEOUS) ×5 IMPLANT
COVER WAND RF STERILE (DRAPES) ×6 IMPLANT
DERMABOND ADVANCED (GAUZE/BANDAGES/DRESSINGS) ×2
DERMABOND ADVANCED .7 DNX12 (GAUZE/BANDAGES/DRESSINGS) ×1 IMPLANT
DRAPE C-ARM 42X72 X-RAY (DRAPES) ×4 IMPLANT
DRAPE C-ARMOR (DRAPES) ×3 IMPLANT
DRAPE INCISE IOBAN 66X45 STRL (DRAPES) ×3 IMPLANT
DRAPE POUCH INSTRU U-SHP 10X18 (DRAPES) ×3 IMPLANT
DRAPE SURG 17X23 STRL (DRAPES) ×3 IMPLANT
DRAPE U-SHAPE 47X51 STRL (DRAPES) ×3 IMPLANT
DRSG OPSITE POSTOP 4X8 (GAUZE/BANDAGES/DRESSINGS) ×3 IMPLANT
DURAPREP 26ML APPLICATOR (WOUND CARE) ×3 IMPLANT
ELECT BLADE 4.0 EZ CLEAN MEGAD (MISCELLANEOUS) ×3
ELECT CAUTERY BLADE 6.4 (BLADE) ×3 IMPLANT
ELECT PENCIL ROCKER SW 15FT (MISCELLANEOUS) ×3 IMPLANT
ELECT REM PT RETURN 9FT ADLT (ELECTROSURGICAL) ×3
ELECTRODE BLDE 4.0 EZ CLN MEGD (MISCELLANEOUS) ×1 IMPLANT
ELECTRODE REM PT RTRN 9FT ADLT (ELECTROSURGICAL) ×1 IMPLANT
FLOSEAL 10ML (HEMOSTASIS) ×3 IMPLANT
GAUZE 4X4 16PLY RFD (DISPOSABLE) IMPLANT
GLOVE BIO SURGEON STRL SZ 6.5 (GLOVE) ×2 IMPLANT
GLOVE BIO SURGEON STRL SZ7.5 (GLOVE) ×2 IMPLANT
GLOVE BIO SURGEONS STRL SZ 6.5 (GLOVE) ×1
GLOVE BIOGEL PI IND STRL 6.5 (GLOVE) ×1 IMPLANT
GLOVE BIOGEL PI IND STRL 7.5 (GLOVE) ×1 IMPLANT
GLOVE BIOGEL PI IND STRL 8.5 (GLOVE) ×2 IMPLANT
GLOVE BIOGEL PI INDICATOR 6.5 (GLOVE) ×2
GLOVE BIOGEL PI INDICATOR 7.5 (GLOVE) ×2
GLOVE BIOGEL PI INDICATOR 8.5 (GLOVE) ×4
GLOVE SS BIOGEL STRL SZ 8.5 (GLOVE) ×2 IMPLANT
GLOVE SUPERSENSE BIOGEL SZ 8.5 (GLOVE) ×4
GLOVE SURG SS PI 7.5 STRL IVOR (GLOVE) ×3 IMPLANT
GOWN STRL REUS W/ TWL LRG LVL3 (GOWN DISPOSABLE) ×3 IMPLANT
GOWN STRL REUS W/ TWL XL LVL3 (GOWN DISPOSABLE) ×1 IMPLANT
GOWN STRL REUS W/TWL 2XL LVL3 (GOWN DISPOSABLE) ×9 IMPLANT
GOWN STRL REUS W/TWL LRG LVL3 (GOWN DISPOSABLE) ×6
GOWN STRL REUS W/TWL XL LVL3 (GOWN DISPOSABLE) ×2
HEMOSTAT SNOW SURGICEL 2X4 (HEMOSTASIS) IMPLANT
INSERT FOGARTY 61MM (MISCELLANEOUS) IMPLANT
INSERT FOGARTY SM (MISCELLANEOUS) IMPLANT
INTERPLATE 39X14X12 (Plate) ×3 IMPLANT
KIT BASIN OR (CUSTOM PROCEDURE TRAY) ×3 IMPLANT
KIT TURNOVER KIT B (KITS) ×6 IMPLANT
LOOP VESSEL MAXI BLUE (MISCELLANEOUS) ×1 IMPLANT
LOOP VESSEL MINI RED (MISCELLANEOUS) ×1 IMPLANT
NDL SPNL 18GX3.5 QUINCKE PK (NEEDLE) ×1 IMPLANT
NEEDLE SPNL 18GX3.5 QUINCKE PK (NEEDLE) IMPLANT
NS IRRIG 1000ML POUR BTL (IV SOLUTION) ×3 IMPLANT
PACK LAMINECTOMY ORTHO (CUSTOM PROCEDURE TRAY) ×3 IMPLANT
PACK UNIVERSAL I (CUSTOM PROCEDURE TRAY) ×3 IMPLANT
PAD ARMBOARD 7.5X6 YLW CONV (MISCELLANEOUS) ×12 IMPLANT
PEEK SPACER INTERPLAT 35X14X12 (Peek) ×2 IMPLANT
PLATE SPINAL INTERPLT 39X14X12 (Plate) IMPLANT
PUTTY BONE BIOACTIVE 5CC (Putty) ×2 IMPLANT
PUTTY DBX 1CC (Putty) ×3 IMPLANT
PUTTY DBX 1CC DEPUY (Putty) IMPLANT
SCREW BONE RESCUE (Screw) ×6 IMPLANT
SCREW BONE STANDARD (Screw) ×3 IMPLANT
SCREW BONE STD (Screw) IMPLANT
SCREW SPINAL STD (Orthopedic Implant) ×2 IMPLANT
SPONGE INTESTINAL PEANUT (DISPOSABLE) ×8 IMPLANT
SPONGE LAP 18X18 X RAY DECT (DISPOSABLE) IMPLANT
SPONGE LAP 4X18 RFD (DISPOSABLE) IMPLANT
SPONGE SURGIFOAM ABS GEL 100 (HEMOSTASIS) ×2 IMPLANT
SUT BONE WAX W31G (SUTURE) ×3 IMPLANT
SUT MNCRL AB 4-0 PS2 18 (SUTURE) ×3 IMPLANT
SUT MON AB 3-0 SH 27 (SUTURE) ×2
SUT MON AB 3-0 SH27 (SUTURE) ×1 IMPLANT
SUT PDS AB 1 CTX 36 (SUTURE) ×6 IMPLANT
SUT PROLENE 4 0 RB 1 (SUTURE) ×8
SUT PROLENE 4-0 RB1 .5 CRCL 36 (SUTURE) ×4 IMPLANT
SUT PROLENE 5 0 C 1 24 (SUTURE) IMPLANT
SUT PROLENE 5 0 CC1 (SUTURE) IMPLANT
SUT PROLENE 6 0 C 1 30 (SUTURE) ×3 IMPLANT
SUT PROLENE 6 0 CC (SUTURE) IMPLANT
SUT SILK 0 TIES 10X30 (SUTURE) ×3 IMPLANT
SUT SILK 2 0 TIES 10X30 (SUTURE) ×6 IMPLANT
SUT SILK 2 0SH CR/8 30 (SUTURE) IMPLANT
SUT SILK 3 0 TIES 10X30 (SUTURE) ×6 IMPLANT
SUT SILK 3 0SH CR/8 30 (SUTURE) IMPLANT
SUT VIC AB 0 CT1 27 (SUTURE) ×2
SUT VIC AB 0 CT1 27XBRD ANBCTR (SUTURE) ×1 IMPLANT
SUT VIC AB 1 CT1 27 (SUTURE) ×4
SUT VIC AB 1 CT1 27XBRD ANBCTR (SUTURE) ×2 IMPLANT
SUT VIC AB 2-0 CT1 18 (SUTURE) ×5 IMPLANT
SUT VIC AB 2-0 CT1 27 (SUTURE) ×2
SUT VIC AB 2-0 CT1 TAPERPNT 27 (SUTURE) ×1 IMPLANT
SUT VIC AB 3-0 SH 27 (SUTURE) ×2
SUT VIC AB 3-0 SH 27X BRD (SUTURE) ×1 IMPLANT
SYR BULB IRRIGATION 50ML (SYRINGE) ×3 IMPLANT
TOWEL GREEN STERILE (TOWEL DISPOSABLE) ×6 IMPLANT
TOWEL GREEN STERILE FF (TOWEL DISPOSABLE) ×3 IMPLANT
TRAP SPECIMEN MUCOUS 40CC (MISCELLANEOUS) ×3 IMPLANT
WATER STERILE IRR 1000ML POUR (IV SOLUTION) ×3 IMPLANT

## 2018-07-27 NOTE — Transfer of Care (Signed)
Immediate Anesthesia Transfer of Care Note  Patient: Tracy [name]  Procedure(s) Performed: ANTERIOR LUMBAR FUSION L5-S1 (N/A ) ABDOMINAL EXPOSURE (N/A )  Patient Location: PACU  Anesthesia Type:General  Level of Consciousness: awake, alert  and oriented  Airway & Oxygen Therapy: Patient Spontanous Breathing  Post-op Assessment: Report given to RN, Post -op Vital signs reviewed and stable, Patient moving all extremities and Patient moving all extremities X 4  Post vital signs: Reviewed and stable  Last Vitals:  Vitals Value Taken Time  BP 112/60 07/27/2018 11:56 AM  Temp    Pulse 77 07/27/2018 12:00 PM  Resp 16 07/27/2018 12:00 PM  SpO2 100 % 07/27/2018 12:00 PM  Vitals shown include unvalidated device data.  Last Pain: 0     Patients Stated Pain Goal: 3 (07/27/18 7902)  Complications: No apparent anesthesia complications

## 2018-07-27 NOTE — Op Note (Signed)
Operative report  Preoperative diagnosis: Degenerative spondylolisthesis L5-S1 with hard disc osteophyte causing radiculopathy.  Postoperative diagnosis: Same  Operative procedure: Anterior lumbar interbody fusion L5-S1  Implants: RSB peek intervertebral cage.  Anterior 0 profile plate affixed with 325 mm screws.  (Please note that this was not an integrated system, plate and cage were separate implants).  Allograft: Nano fuse, DBX  Approach surgeon: Dr. Durene CalWells Brabham  Complications: None  Anesthesia: General anesthesia with supplemental TAP block  Condition: Stable  Operative report: Patient is brought the operating room placed by the operating room table.  Prior to entering the operating room anesthesia performed a TAP to aid in postoperative analgesia.  After successful induction of general anesthesia and endotracheal intubation, teds SCDs and a Foley were placed.  The patient was then placed supine on the flat [place] operating room table.  The arms were placed on the arm boards and the anterior abdomen was prepped and draped in a standard fashion.  Timeout was taken to confirm patient procedure and all other important data.  Dr. Myra GianottiBrabham then performed a standard anterior retroperitoneal approach to the lumbar spine.  Please refer to his dictation for specifics.  Once we had identified the L5-S1 level and placed a needle into the disc space and x-ray was taken to confirm we are at the appropriate level.  Once this was confirmed Dr. Myra GianottiBrabham scrubbed out and I continued to the operative case.  An annulotomy was performed with a 10 blade scalpel and then using pituitary rongeurs and curettes I removed all the disc material.  I then placed intervertebral distraction plugs until I could visualize the posterior annulus.  Using a small curved curette I continue removing the posterior disc material until I could palpate the posterior aspect of the vertebral body.  I then dissected with the  curette until I was behind the posterior vertebral body.  This allowed me to release the annulus the posterior aspect of L5 and S1.  I then used 2 and 3 mm Kerrison Roger to remove the bulk of the posterior annulus and disc material.  At this point an excellent discectomy.  I then under live fluoroscopy placed the interlaminar spreader and then distracted and released in order to confirm parallel endplate distraction.  Once I confirm this I then proceeded with the fusion.  Trial rasps were then placed starting at the size 14 mm 12 degree lordotic implant.  This was placed and I had excellent fixation at both endplates.  I also was able to restore the lumbar lordosis, as well as reduced the spondylolisthesis.  At this point elected to use this sized implant.  It provided excellent indirect foraminal decompression and did not over distract the disc space.  At this point I removed the trial implant and made sure that I had bleeding subchondral bone.  This was accomplished using a rasp as well as curved curettes.  I then irrigated the wound copiously normal saline.  I then removed a portion of the S1 endplate in order to accommodate the 0 profile plate.  I aspirated the blood from the cancellus bone that I had exposed and mixed it with a nano fuse.  At this point with bleeding subchondral bone and confirmed adequate discectomy and parallel endplate distraction I move forward with the fusion.  The peek intervertebral cage was packed with the nano fuse and then gently malleted to the appropriate depth.  Once this was completed I then obtain the appropriate sized anterior 0 profile plate.  1 cc of DBX was then placed along the anterior aspect of the peek cage in order to facilitate an anterior sentinel fusion.  I then applied the anterior bar plate and malleted it to the appropriate depth.  Once it was at the appropriate depth I then broached the endplates with the awl and placed the locking screws.  All 3 locking screws  had excellent purchase.  I then placed the locking plate on top of the plate and locked it into place.  This was done according manufacture standards.  At this point the fusion was complete and I irrigated the wound copiously with normal saline.  X-rays were taken confirming I had satisfactory positioning of the hardware and restoration of lumbar lordosis.  The Thompson retractor blades were sequentially removed and I confirmed there was no bleeding.  I did place FloSeal to aid in the postoperative hemostasis.  I then closed the rectus sheath with a running #1 PDS suture.  The fascia was closed I then injected Exparel to aid in postoperative analgesia.  I then closed the remainder of the incision in a layered fashion with interrupted #1 Vicryl suture, 2-0 Vicryl suture, and 3-0 Monocryl.  Final x-ray was taken AP digital spot to confirm there were no retained surgical instruments.  Once this was cleared by the radiologist Steri-Strips and a dry dressing were applied.  The patient was ultimately extubated transfer the PACU without incident.  The end of the case all needle sponge counts were correct.

## 2018-07-27 NOTE — Anesthesia Procedure Notes (Addendum)
Procedure Name: Intubation Date/Time: 07/27/2018 8:50 AM Performed by: Jearld Pies, CRNA Pre-anesthesia Checklist: Patient identified, Emergency Drugs available, Suction available and Patient being monitored Patient Re-evaluated:Patient Re-evaluated prior to induction Oxygen Delivery Method: Circle System Utilized Preoxygenation: Pre-oxygenation with 100% oxygen Induction Type: IV induction Ventilation: Mask ventilation without difficulty and Oral airway inserted - appropriate to patient size Laryngoscope Size: Mac and 3 Grade View: Grade I Tube type: Oral Tube size: 7.0 mm Number of attempts: 1 Airway Equipment and Method: Stylet and Oral airway Placement Confirmation: ETT inserted through vocal cords under direct vision,  positive ETCO2 and breath sounds checked- equal and bilateral Secured at: 22 cm Tube secured with: Tape Dental Injury: Teeth and Oropharynx as per pre-operative assessment  Comments: DL x 1 with Miller 2, unable to pin epiglottis, DL with MAC 3, G1v with Cricoid

## 2018-07-27 NOTE — H&P (Signed)
History of Present Illness Patient is scheduled for an ALIF L5-S1 on 07/27/18 with Dr. Shon BatonBrooks at [facility] . She c/o low back pain and left leg dysathesias and neuropathic pain secondary to spondyloisthesis. Her PMH is notable for HIV for which she is treated. She has received medical clearance from bother her PCP and ID providers.  Allergies Reviewed Allergies CYCLOBENZAPRINE  MOXIFLOXACIN: Rash  TETRACYCLINE  TRAMADOL: Itching   Medications benztropine 0.5 mg tablet Complera 200 mg-25 mg-300 mg tablet gabapentin 300 mg capsule loratadine 10 mg tablet methocarbamoL 750 mg tablet perphenazine 4 mg tablet ProAir HFA 90 mcg/actuation aerosol inhaler rizatriptan SUMAtriptan 100 mg tablet Tivicay 50 mg tablet topiramate 100 mg tablet topiramate 50 mg tablet  Problems Human immunodeficiency virus infection  Lumbar radiculopathy Spondylolisthesis   Family History Mother - Diabetes mellitus   - Hypertensive disorder   - Hypertensive disorder   - Diabetes mellitus Maternal Uncle - Diabetes mellitus   - Hypertensive disorder   - Diabetes mellitus   - Hypertensive disorder Maternal Aunt - History of carcinoma Sister - Hypertensive disorder   - Hypertensive disorder  Social History Tobacco Smoking Status: Current some day smoker Smoker (1 PPW) Tobacco-years of use: 30  Surgical History Hysterectomy - 07/13/2012  Past Medical History Chronic Back Pain: Y Depression: Y HIV or AIDS: Y Headaches: Y Joint Pain: Y Swelling of Legs/Feet/Hands: Y Weight loss: Y  Physical Exam General: AAOX3, well developed and well nourished, NAD Heart: RRR, nbo rubs, murmers, or gallops  Lungs: CTAB  Abdomen: Normal bowel soundsX4, soft, non-tender, no hepatosplenomegaly.   ROM: -Spine: normal ROM, pain elicited with fwd flexion and extension.  - Knee: flexion and extension normal and pain free bilaterally.  - Ankle: Dorsiflexion, plantarflexion, inversion, eversion  normal and pain free.   Dermatomes: Lower extremity sensation to light touch abnormal with positive dysathesias on the left.  Myotomes:   - Hip Flexion: Left 5/5, Right 5/5  -Hip Adduction: Left 5/5, Right 5/5  - Knee Extension: Left 5/5, Right 5/5  - Knee Flexion: Left 5/5, Right 5/5  - Ankle Dorsiflextion: Left 5/5, Right 5/5  - Ankle Eversion: Left 5/5, Right 5/5  - Ankle Plantarflexion: Left 5/5, Right 5/5  Reflexes:   - Patella: Left2+, Right 2+  - Achilles: Left2+, Right 2+  - Babinski: Left Ngative, Right Negative  - Clonus: Negative  Special Tests:  - Straight Leg Raise: Left Negative, Right Negative  PV: Extremities warm and well profused. Posterior and dorsalis pedis pulse 2+ bilaterally, No pitting Edema, discoloration, calf tenderness, or palpable cords. Homan's negative bilaterally.   X-rays from 03/03/18: Degenerative spondylolisthesis L5-S1 grade 1 with collapse and degenerative disc disease is noted. No acute fracture seen.  MRI of the lumbar spine dated 03/10/18: Grade 1 spondylolisthesis L5 on S1 with severe changes of the facet joints bilaterally positive facet joint effusions. Significant collapse of the disc space is noted. No significant foraminal stenosis is seen. There is moderate right and central stenosis due to protrusion of the disc which does displace the S1 nerve root. No acute fracture is seen.  Assessment & Plan Diagnosis: [name] is a very pleasant young lady who has been having debilitating back pain for the last year. [place] but prior to coming here in 2017 she underwent physical therapy, as well as injection therapy. Unfortunately she continues to deteriorate. She does have neuropathic leg pain but her principal overwhelming source of disability is her back pain.  Imaging studies confirm a grade 1 degenerative spondylolisthesis, and degenerative disc disease. At this point I do think her primary pain  generator is the degenerative disease at L5-S1.  Risks and benefits of surgery were discussed with the patient. These include: Infection, bleeding, death, stroke, paralysis, ongoing or worse pain, need for additional surgery, nonunion, leak of spinal fluid, adjacent segment degeneration requiring additional fusion surgery, need for posterior decompression and/or fusion. Bleeding from major vessels, and blood clots (deep venous thrombosis)requiring additional treatment. Due to the abdominal contents requiring further intervention, loss in bowel and bladder control.  I also advised her and her husband that the goal of surgery is reduction not elimination in pain and thereby improvement in quality-of-life.

## 2018-07-27 NOTE — Brief Op Note (Signed)
07/27/2018  11:59 AM  PATIENT:  Tracy [name]  53 y.o. female  PRE-OPERATIVE DIAGNOSIS:  L5-S1 grade I slip with degenerative disc disease  POST-OPERATIVE DIAGNOSIS:  L5-S1 grade I slip with degenerative disc disease  PROCEDURE:  Procedure(s) with comments: ANTERIOR LUMBAR FUSION L5-S1 (N/A) - 3 hrs/ Dr. Myra Gianotti to do approach HIV Positive ABDOMINAL EXPOSURE (N/A)  SURGEON:  Surgeon(s) and Role: Panel 1:    Venita Lick, MD - Primary Panel 2:    * Nada Libman, MD - Primary  PHYSICIAN ASSISTANT:   ASSISTANTS: none   ANESTHESIA:   general and and supplemental TAP block  EBL:  150 mL   BLOOD ADMINISTERED:none  DRAINS: none   LOCAL MEDICATIONS USED:  MARCAINE    and OTHER exparel  SPECIMEN:  No Specimen  DISPOSITION OF SPECIMEN:  N/A  COUNTS:  YES  TOURNIQUET:  * No tourniquets in log *  DICTATION: .Dragon Dictation  PLAN OF CARE: Admit to inpatient   PATIENT DISPOSITION:  PACU - hemodynamically stable.

## 2018-07-27 NOTE — Anesthesia Postprocedure Evaluation (Signed)
Anesthesia Post Note  Patient: Tracy [name]  Procedure(s) Performed: ANTERIOR LUMBAR FUSION L5-S1 (N/A ) ABDOMINAL EXPOSURE (N/A )     Patient location during evaluation: PACU Anesthesia Type: General Level of consciousness: awake and alert Pain management: pain level controlled Vital Signs Assessment: post-procedure vital signs reviewed and stable Respiratory status: spontaneous breathing, nonlabored ventilation and respiratory function stable Cardiovascular status: blood pressure returned to baseline and stable Postop Assessment: no apparent nausea or vomiting Anesthetic complications: no    Last Vitals:  Vitals:   07/27/18 1255 07/27/18 1324  BP: 124/70 126/74  Pulse: 63 (!) 56  Resp: 16 18  Temp: (!) 36.4 C 36.6 C  SpO2: 100% 99%    Last Pain:  Vitals:   07/27/18 1324  TempSrc: Oral  PainSc:                  [name]

## 2018-07-27 NOTE — Interval H&P Note (Signed)
History and Physical Interval Note:  07/27/2018 8:19 AM  Tracy [name]  has presented today for surgery, with the diagnosis of Ls-S1 grade I slip with degenerative disc disease  The various methods of treatment have been discussed with the patient and family. After consideration of risks, benefits and other options for treatment, the patient has consented to  Procedure(s) with comments: ANTERIOR LUMBAR FUSION L5-S1 (N/A) - 3 hrs/ Dr. Myra Gianotti to do approach HIV Positive ABDOMINAL EXPOSURE (N/A) as a surgical intervention .  The patient's history has been reviewed, patient examined, no change in status, stable for surgery.  I have reviewed the patient's chart and labs.  Questions were answered to the patient's satisfaction.     Durene Cal

## 2018-07-27 NOTE — Op Note (Signed)
    Patient name: Tracy [name] MRN: [id number] DOB: 1966-02-09 Sex: female  07/27/2018 Pre-operative Diagnosis: Degenerative back disease Post-operative diagnosis:  Same Surgeon:  Durene Cal Co-Surgeon:  D. [name] Procedure:   Anterior exposure, L5-S1 Anesthesia: General Blood Loss: Minimal Specimens: None  Findings: Normal anatomy  Indications: Anterior approach for L5-S1 instrumentation has been recommended by Dr. Shon Baton.  I have been asked to provide anterior exposure.  The risks and benefits of the procedure were discussed with the patient at her preoperative visit.  Procedure:  The patient was identified in the holding area and taken to [facility] OR ROOM 04  The patient was then placed supine on the table. general anesthesia was administered.  The patient was prepped and draped in the usual sterile fashion.  A time out was called and antibiotics were administered.  Fluoroscopy was used to determine the appropriate level of the skin incision.  A left lower quadrant transverse incision was made.  Cautery was used about subcutaneous tissue down to the fascia.  The fascia was then opened with cautery.  Subfascial flaps were then raised.  I initially entered the retroperitoneal space lateral to the rectus muscle.  The psoas muscle was identified followed by identification of the left external iliac artery.  The iliac artery was then skeletonized moving cephalad.  The ureter was identified, protected laterally.  I then identified the iliac vein.  There was a iliolumbar branch that was divided between silk ties.  With the use of Wiley retractors, the spine was exposed.  Once a had good exposure, a lap pad was placed into the incision and then we reinserted the wire retractors, medial to the rectus muscle.  The spine was then cleared free on either side.  A 150 reverse lip was first placed along the right lateral side of the spine after positioning the Thompson retractor.  I then cleared off the left  lateral side of the spine and placed a second 150 reverse lip blade.  A spinal needle was then inserted and fluoroscopy came in to confirm the level of the exposure.  It was recognized at this time that the L 4-L5 space had been exposed.  I then repositioned the retractors so that we had the L5-S1 disc space exposed.  A spinal needle was then reinserted and fluoroscopy confirmed that we were now at the appropriate level of the intended disc space.  Malleable retractors were then placed superiorly and inferiorly.  At this point, Dr. Shon Baton came in and performed his portion of the procedure.  Please see his detailed operative note for the remaining portion of the procedure.  Disposition: To PACU stable.   Juleen China, M.D. [facility] Office: [phone] Pager:  [phone]

## 2018-07-27 NOTE — Progress Notes (Signed)
Bilateral lower extremity venous duplex has been completed.   Preliminary results in CV Proc.   Blanch Media 07/27/2018 2:49 PM

## 2018-07-27 NOTE — Progress Notes (Signed)
Orthopedic Tech Progress Note Patient Details:  Tracy [name] January 13, 1966 [id number]  Patient ID: Tracy [name], female   DOB: March 31, 1966, 53 y.o.   MRN: [id number]   Tracy [name] 07/27/2018, 1:14 PMCalled [employer] for LSO brace.

## 2018-07-27 NOTE — Anesthesia Procedure Notes (Signed)
Anesthesia Regional Block: TAP block   Pre-Anesthetic Checklist: ,, timeout performed, Correct Patient, Correct Site, Correct Laterality, Correct Procedure, Correct Position, site marked, Risks and benefits discussed, pre-op evaluation,  At surgeon's request and post-op pain management  Laterality: Left  Prep: Maximum Sterile Barrier Precautions used, chloraprep       Needles:  Injection technique: Single-shot  Needle Type: Echogenic Stimulator Needle     Needle Length: 9cm  Needle Gauge: 21     Additional Needles:   Narrative:  Start time: 07/27/2018 8:29 AM End time: 07/27/2018 8:26 AM Injection made incrementally with aspirations every 5 mL. Anesthesiologist: [name], MD  Additional Notes: Risks, benefits, and alternative discussed. Patient gave consent for procedure. Patient prepped and draped in sterile fashion. Sedation administered, patient remains easily responsive to voice. Relevant anatomy identified with ultrasound guidance. Local anesthetic given in 5cc increments with no signs or symptoms of intravascular injection. No pain or paraesthesias with injection. Patient monitored throughout procedure with signs of LAST or immediate complications. Tolerated well. Ultrasound image placed in chart. Amalia Greenhouse, MD

## 2018-07-28 ENCOUNTER — Encounter (HOSPITAL_COMMUNITY): Payer: Self-pay | Admitting: Orthopedic Surgery

## 2018-07-28 MED ORDER — OXYCODONE-ACETAMINOPHEN 10-325 MG PO TABS: 1.0000 | ORAL_TABLET | Freq: Four times a day (QID) | ORAL | 0 refills | Status: AC | PRN

## 2018-07-28 MED ORDER — MAGNESIUM CITRATE PO SOLN
0.5000 | Freq: Once | ORAL | Status: AC
  Administered 2018-07-28: 0.5 via ORAL
  Filled 2018-07-28: qty 296

## 2018-07-28 MED ORDER — ONDANSETRON HCL 4 MG PO TABS: 4.0000 mg | ORAL_TABLET | Freq: Three times a day (TID) | ORAL | 0 refills | Status: DC | PRN

## 2018-07-28 MED ORDER — ENOXAPARIN SODIUM 40 MG/0.4ML ~~LOC~~ SOLN: 40.0000 mg | SUBCUTANEOUS | 0 refills | Status: AC

## 2018-07-28 MED ORDER — METHOCARBAMOL 500 MG PO TABS: 500.0000 mg | ORAL_TABLET | Freq: Three times a day (TID) | ORAL | 0 refills | Status: AC | PRN

## 2018-07-28 MED FILL — OXYCODONE-ACETAMINOPHEN 10-: 10-325 | 5 days supply | Qty: 20 | Fill #0

## 2018-07-28 MED FILL — ONDANSETRON HCL 4 MG TABLET: 4 | 7 days supply | Qty: 20 | Fill #0

## 2018-07-28 MED FILL — METHOCARBAMOL 500 MG TABS: 500 | 5 days supply | Qty: 15 | Fill #0

## 2018-07-28 MED FILL — Thrombin (Recombinant) For Soln 20000 Unit: CUTANEOUS | Qty: 1 | Status: AC

## 2018-07-28 NOTE — Progress Notes (Signed)
    Subjective  - POD #1  Back feels great.  Sensation in legs better No flatus Pain at incision site Ambulated in halls x 5   Physical Exam:  abd soft       Assessment/Plan:  POD #1  Awaiting flatus.  Tolerating diet.   Doing great  Tracy [name] 07/28/2018 10:18 AM --  Vitals:   07/28/18 0343 07/28/18 0734  BP: 104/67 102/60  Pulse: 67 62  Resp: 18 16  Temp: 98.2 F (36.8 C) 97.7 F (36.5 C)  SpO2: 100% 100%    Intake/Output Summary (Last 24 hours) at 07/28/2018 1018 Last data filed at 07/27/2018 2340 Gross per 24 hour  Intake 2557.4 ml  Output 400 ml  Net 2157.4 ml     Laboratory CBC    Component Value Date/Time   WBC 4.5 07/26/2018 0837   HGB 13.2 07/26/2018 0837   HCT 41.6 07/26/2018 0837   PLT 243 07/26/2018 0837    BMET    Component Value Date/Time   NA 141 07/26/2018 0837   K 3.5 07/26/2018 0837   CL 112 (H) 07/26/2018 0837   CO2 23 07/26/2018 0837   GLUCOSE 109 (H) 07/26/2018 0837   BUN 12 07/26/2018 0837   CREATININE 1.21 (H) 07/26/2018 0837   CREATININE 1.02 07/28/2017 1452   CALCIUM 9.1 07/26/2018 0837   GFRNONAA 52 (L) 07/26/2018 0837   GFRNONAA 58 (L) 04/07/2017 1446   GFRAA 60 (L) 07/26/2018 0837   GFRAA 67 04/07/2017 1446    COAG Lab Results  Component Value Date   INR 0.96 12/29/2016   No results found for: PTT  Antibiotics Anti-infectives (From admission, onward)   Start     Dose/Rate Route Frequency Ordered Stop   07/28/18 0800  emtricitabine-rilpivir-tenofovir AF (ODEFSEY) 200-25-25 MG per tablet 1 tablet     1 tablet Oral Daily with breakfast 07/27/18 1334     07/27/18 1345  emtricitabine-tenofovir AF (DESCOVY) 200-25 MG per tablet 1 tablet  Status:  Discontinued     1 tablet Oral Daily 07/27/18 1334 07/27/18 1409   07/27/18 1345  tenofovir (VIREAD) tablet 300 mg  Status:  Discontinued     300 mg Oral Daily 07/27/18 1334 07/27/18 1410   07/27/18 1345  dolutegravir (TIVICAY) tablet 50 mg     50 mg Oral Daily  07/27/18 1334     07/27/18 1345  ceFAZolin (ANCEF) IVPB 1 g/50 mL premix     1 g 100 mL/hr over 30 Minutes Intravenous Every 8 hours 07/27/18 1334 07/27/18 2100   07/27/18 0640  ceFAZolin (ANCEF) IVPB 2g/100 mL premix     2 g 200 mL/hr over 30 Minutes Intravenous 30 min pre-op 07/27/18 0640 07/27/18 0850       V. Charlena Cross, M.D. [facility] [place] Office: [phone] Pager:  [phone]

## 2018-07-28 NOTE — Evaluation (Signed)
Occupational Therapy Evaluation Patient Details Name: Tracy [name] MRN: [id number] DOB: September 19, 1965 Today's Date: 07/28/2018    History of Present Illness Pt is a 53 y/o female who presents s/p L5-S1 ALIF with addominal exposure. PMH significant for CVA, schizoaffective disorder, HIV, depression.   Clinical Impression   This [age] y/o female presents with the above. At baseline pt is independent with ADL and functional mobility. Pt demonstrating functional mobility without AD during this session with overall minguard assist. She currently requires setup assist for UB ADL, minguard assist for LB ADL. Reviewed and further educated re: back precautions, brace management, safety and compensatory strategies for performing ADL and functional mobility while maintaining precautions. Pt requiring intermittent cues for adherence though overall with good carryover. Questions answered throughout session with no further acute OT needs identified at this time. Pt reports she will return home with spouse who is able to assist with ADL/iADL PRN. Acute OT to sign off at this time. Thank you for this referral.     Follow Up Recommendations  No OT follow up;Supervision/Assistance - 24 hour(24hr initially)    Equipment Recommendations  3 in 1 bedside commode           Precautions / Restrictions Precautions Precautions: Fall;Back Precaution Booklet Issued: Yes (comment) Precaution Comments: Reviewed in detail during session Required Braces or Orthoses: Spinal Brace Spinal Brace: Lumbar corset;Applied in sitting position Restrictions Weight Bearing Restrictions: No      Mobility Bed Mobility Overal bed mobility: Needs Assistance Bed Mobility: Rolling;Sidelying to Sit Rolling: Modified independent (Device/Increase time) Sidelying to sit: Supervision       General bed mobility comments: Pt received up in recliner  Transfers Overall transfer level: Needs assistance Equipment used: None Transfers:  Sit to/from Stand Sit to Stand: Supervision         General transfer comment: Supervision for safety as pt powered up to full stand. Pt demonstrated proper hand placement on seated surface for safety.     Balance Overall balance assessment: Needs assistance Sitting-balance support: Feet supported;No upper extremity supported Sitting balance-Leahy Scale: Fair     Standing balance support: No upper extremity supported Standing balance-Leahy Scale: Fair                             ADL either performed or assessed with clinical judgement   ADL Overall ADL's : Needs assistance/impaired Eating/Feeding: Modified independent;Sitting   Grooming: Min guard;Standing Grooming Details (indicate cue type and reason): reviewed method of using two cups Upper Body Bathing: Min guard;Sitting   Lower Body Bathing: Min guard;Sit to/from stand   Upper Body Dressing : Set up;Sitting Upper Body Dressing Details (indicate cue type and reason): verbally reviewed brace management, pt verbalizes feeling comfortable with donning/doffing Lower Body Dressing: Min guard;Sit to/from stand Lower Body Dressing Details (indicate cue type and reason): pt easily able to perform figure 4 technique for LB dressing, educated to use this method for all LB ADL Toilet Transfer: Min guard;Ambulation;BSC Toilet Transfer Details (indicate cue type and reason): BSC over toilet; simulated in transfer to/from recliner Toileting- ArchitectClothing Manipulation and Hygiene: Min guard;Sit to/from stand   Tub/ Shower Transfer: Walk-in shower;Min guard;Ambulation;3 in 1 Tub/Shower Transfer Details (indicate cue type and reason): educated on use of 3:1 as shower seat, pt demonstrating stepping into/out of shower with minguard for safety, min cues to maintain precautions; educated to have spouse present for supervision/safety initially with transfer completion Functional mobility during ADLs: Min guard  General ADL Comments:  educated pt re: back precautions, brace management, safety, and compensatory strategies for performing ADL and mobility tasks with pt verbalizing and return demonstrating understanding, intermittent cues to increase adherence      Vision         Perception     Praxis      Pertinent Vitals/Pain Pain Assessment: Faces Faces Pain Scale: Hurts little more Pain Location: Abdomen at incision areas Pain Descriptors / Indicators: Operative site guarding;Discomfort Pain Intervention(s): Limited activity within patient's tolerance;Monitored during session;Repositioned     Hand Dominance Right   Extremity/Trunk Assessment Upper Extremity Assessment Upper Extremity Assessment: Overall WFL for tasks assessed   Lower Extremity Assessment Lower Extremity Assessment: Defer to PT evaluation RLE Deficits / Details: Bilaterally, decreased strength and muscular endurance consistent with pre-op diagnosis.  RLE Sensation: WNL   Cervical / Trunk Assessment Cervical / Trunk Assessment: Other exceptions Cervical / Trunk Exceptions: s/p surgery   Communication Communication Communication: No difficulties   Cognition Arousal/Alertness: Awake/alert Behavior During Therapy: WFL for tasks assessed/performed Overall Cognitive Status: Within Functional Limits for tasks assessed                                     General Comments       Exercises     Shoulder Instructions      Home Living Family/patient expects to be discharged to:: Private residence Living Arrangements: Spouse/significant other;Other relatives Available Help at Discharge: Family;Available 24 hours/day(Husband and sister) Type of Home: Apartment Home Access: Stairs to enter Entrance Stairs-Number of Steps: 1 small threshold step   Home Layout: One level     Bathroom Shower/Tub: Producer, television/film/video: Standard                Prior Functioning/Environment Level of Independence:  Independent                 OT Problem List: Decreased range of motion;Decreased activity tolerance;Decreased knowledge of precautions;Pain;Impaired balance (sitting and/or standing)      OT Treatment/Interventions:      OT Goals(Current goals can be found in the care plan section) Acute Rehab OT Goals Patient Stated Goal: Home today OT Goal Formulation: All assessment and education complete, DC therapy  OT Frequency:     Barriers to D/C:            Co-evaluation              AM-PAC OT "6 Clicks" Daily Activity     Outcome Measure Help from another person eating meals?: None Help from another person taking care of personal grooming?: None Help from another person toileting, which includes using toliet, bedpan, or urinal?: None Help from another person bathing (including washing, rinsing, drying)?: None Help from another person to put on and taking off regular upper body clothing?: None Help from another person to put on and taking off regular lower body clothing?: None 6 Click Score: 24   End of Session Equipment Utilized During Treatment: Back brace Nurse Communication: Mobility status  Activity Tolerance: Patient tolerated treatment well Patient left: in chair;with call bell/phone within reach  OT Visit Diagnosis: Other abnormalities of gait and mobility (R26.89);Pain Pain - part of body: (back/incision site)                Time: 0175-1025 OT Time Calculation (min): 13 min Charges:  OT General Charges $OT Visit:  1 Visit OT Evaluation $OT Eval Low Complexity: 1 Low  Marcy Siren, OT [facility] Pager [phone] Office [phone]   Orlando Penner 07/28/2018, 10:47 AM

## 2018-07-28 NOTE — Progress Notes (Signed)
    Subjective: Procedure(s) (LRB): ANTERIOR LUMBAR FUSION L5-S1 (N/A) ABDOMINAL EXPOSURE (N/A) 1 Day Post-Op  Patient reports pain as 2 on 0-10 scale.  Reports decreased leg pain reports incisional back pain   Positive void Negative bowel movement Negative flatus Negative chest pain or shortness of breath  Objective: Vital signs in last 24 hours: Temp:  [97.2 F (36.2 C)-98.5 F (36.9 C)] 97.7 F (36.5 C) (01/16 0734) Pulse Rate:  [56-70] 62 (01/16 0734) Resp:  [12-20] 16 (01/16 0734) BP: (102-126)/(60-74) 102/60 (01/16 0734) SpO2:  [98 %-100 %] 100 % (01/16 0734)  Intake/Output from previous day: 01/15 0701 - 01/16 0700 In: 2557.4 [P.O.:240; I.V.:2317.4] Out: 600 [Urine:450; Blood:150]  Labs: Recent Labs    07/26/18 0837  WBC 4.5  RBC 4.22  HCT 41.6  PLT 243   Recent Labs    07/26/18 0837  NA 141  K 3.5  CL 112*  CO2 23  BUN 12  CREATININE 1.21*  GLUCOSE 109*  CALCIUM 9.1   No results for input(s): LABPT, INR in the last 72 hours.  Physical Exam: Intact pulses distally Dorsiflexion/Plantar flexion intact Incision: dressing C/D/I Compartment soft 5/5 EHL/TA/GA bilateraly.  thigh numbess and sense of heaviness is improving.   Body mass index is 27.17 kg/m.   Patient is ambulating with a cane, notes improvement in gait with less pain   Assessment/Plan: Patient stable  xrays n/a Continue mobilization with physical therapy Continue care  Advance diet Up with therapy  Plan on d/c to home after flatus and/or BM. Negative doppler - start lovenox today  Venita Lickahari Jonelle Bann, MD [facility] [phone]

## 2018-07-28 NOTE — Progress Notes (Signed)
Patient is discharged from room 3C03 at this time. Alert and in stable condition. IV site d/c'[d and instructions read to patient and spouse with understanding verbalized. Left unit via wheelchair with all belongings at side. 

## 2018-07-28 NOTE — Evaluation (Signed)
Physical Therapy Evaluation Patient Details Name: Tracy [name] MRN: [id number] DOB: [birth year] Today's Date: 07/28/2018   History of Present Illness  Pt is a 53 y/o female who presents s/p L5-S1 ALIF with addominal exposure. PMH significant for CVA, schizoaffective disorder, HIV, depression.  Clinical Impression  Pt admitted with above diagnosis. Pt currently with functional limitations due to the deficits listed below (see PT Problem List). At the time of PT eval pt was able to perform transfers and ambulation with gross supervision for safety and no AD. Pt moving slow but generally steady and did not require assistance throughout functional mobility. Pt was educated on car transfer, precautions, safe activity progression and positioning recommendations. Pt will benefit from skilled PT to increase their independence and safety with mobility to allow discharge to the venue listed below.       Follow Up Recommendations No PT follow up;Supervision for mobility/OOB    Equipment Recommendations  None recommended by PT    Recommendations for Other Services       Precautions / Restrictions Precautions Precautions: Fall;Back Precaution Booklet Issued: Yes (comment) Precaution Comments: Reviewed in detail during functional mobility. Required Braces or Orthoses: Spinal Brace Spinal Brace: Lumbar corset;Applied in sitting position Restrictions Weight Bearing Restrictions: No      Mobility  Bed Mobility Overal bed mobility: Needs Assistance Bed Mobility: Rolling;Sidelying to Sit Rolling: Modified independent (Device/Increase time) Sidelying to sit: Supervision       General bed mobility comments: Increased time. Pt required VC's for proper log roll technique.   Transfers Overall transfer level: Needs assistance Equipment used: None Transfers: Sit to/from Stand Sit to Stand: Supervision         General transfer comment: Supervision for safety as pt powered up to full stand.  Pt demonstrated proper hand placement on seated surface for safety.   Ambulation/Gait Ambulation/Gait assistance: Min guard;Supervision Gait Distance (Feet): 375 Feet Assistive device: None Gait Pattern/deviations: Step-through pattern;Decreased stride length Gait velocity: Decreased Gait velocity interpretation: 1.31 - 2.62 ft/sec, indicative of limited community ambulator General Gait Details: Min guard assist progressing to supervision for safety. Pt was able to ambulate fairly well with minor decreased floor clearance noted on the RLE.   Stairs            Wheelchair Mobility    Modified Rankin (Stroke Patients Only)       Balance Overall balance assessment: Needs assistance Sitting-balance support: Feet supported;No upper extremity supported Sitting balance-Leahy Scale: Fair     Standing balance support: No upper extremity supported Standing balance-Leahy Scale: Fair                               Pertinent Vitals/Pain Pain Assessment: Faces Faces Pain Scale: Hurts little more Pain Location: Abdomen at incision areas Pain Descriptors / Indicators: Operative site guarding;Discomfort Pain Intervention(s): Limited activity within patient's tolerance;Monitored during session;Repositioned    Home Living Family/patient expects to be discharged to:: Private residence Living Arrangements: Spouse/significant other;Other relatives Available Help at Discharge: Family;Available 24 hours/day(Husband and sister) Type of Home: Apartment Home Access: Stairs to enter   Entrance Stairs-Number of Steps: 1 small threshold step Home Layout: One level        Prior Function Level of Independence: Independent               Hand Dominance   Dominant Hand: Right    Extremity/Trunk Assessment   Upper Extremity Assessment Upper Extremity Assessment: Defer to  OT evaluation    Lower Extremity Assessment Lower Extremity Assessment: Generalized weakness;RLE  deficits/detail RLE Deficits / Details: Bilaterally, decreased strength and muscular endurance consistent with pre-op diagnosis.  RLE Sensation: WNL    Cervical / Trunk Assessment Cervical / Trunk Assessment: Other exceptions Cervical / Trunk Exceptions: s/p surgery  Communication   Communication: No difficulties  Cognition Arousal/Alertness: Awake/alert Behavior During Therapy: WFL for tasks assessed/performed Overall Cognitive Status: Within Functional Limits for tasks assessed                                        General Comments      Exercises     Assessment/Plan    PT Assessment Patient needs continued PT services  PT Problem List Decreased strength;Decreased activity tolerance;Decreased balance;Decreased mobility;Decreased knowledge of use of DME;Pain;Decreased safety awareness;Decreased knowledge of precautions       PT Treatment Interventions DME instruction;Gait training;Stair training;Functional mobility training;Therapeutic activities;Therapeutic exercise;Neuromuscular re-education;Patient/family education    PT Goals (Current goals can be found in the Care Plan section)  Acute Rehab PT Goals Patient Stated Goal: Home today PT Goal Formulation: With patient Time For Goal Achievement: 08/04/18 Potential to Achieve Goals: Good    Frequency Min 5X/week   Barriers to discharge        Co-evaluation               AM-PAC PT "6 Clicks" Mobility  Outcome Measure Help needed turning from your back to your side while in a flat bed without using bedrails?: None Help needed moving from lying on your back to sitting on the side of a flat bed without using bedrails?: None Help needed moving to and from a bed to a chair (including a wheelchair)?: None Help needed standing up from a chair using your arms (e.g., wheelchair or bedside chair)?: None Help needed to walk in hospital room?: A Little Help needed climbing 3-5 steps with a railing? : A  Little 6 Click Score: 22    End of Session Equipment Utilized During Treatment: Back brace Activity Tolerance: Patient tolerated treatment well Patient left: in chair;with call bell/phone within reach Nurse Communication: Mobility status PT Visit Diagnosis: Unsteadiness on feet (R26.81);Pain Pain - part of body: (abdomen)    Time: 5701-7793 PT Time Calculation (min) (ACUTE ONLY): 19 min   Charges:   PT Evaluation $PT Eval Moderate Complexity: 1 Mod          Conni Slipper, PT, DPT Acute Rehabilitation Services Pager: [phone] Office: [phone]   Marylynn [name] 07/28/2018, 10:23 AM

## 2018-07-28 NOTE — Discharge Summary (Signed)
Patient ID: Hanadi Stanly MRN: [id number] DOB/AGE: 1965-07-22 53 y.o.  Admit date: 07/27/2018 Discharge date: 07/28/2018  Admission Diagnoses:  Active Problems:   Degenerative disc disease at L5-S1 level   Discharge Diagnoses:  Active Problems:   Degenerative disc disease at L5-S1 level  status post Procedure(s): ANTERIOR LUMBAR FUSION L5-S1 ABDOMINAL EXPOSURE  Past Medical History:  Diagnosis Date  . Arthritis   . Chronic back pain   . Chronic headaches   . Depression   . HIV infection (HCC)   . Joint pain   . Localized swelling of both lower legs    Legs, feet and hands  . Schizoaffective disorder (HCC)   . Seasonal allergies   . Stroke (cerebrum) [facility])    patient denies, states it was a heat stroke  . Weight loss     Surgeries: Procedure(s): ANTERIOR LUMBAR FUSION L5-S1 ABDOMINAL EXPOSURE on 07/27/2018   Consultants: Treatment Team:  Nada Libman, MD  Discharged Condition: Improved  Hospital Course: Kavina Cantave is an 53 y.o. female who was admitted 07/27/2018 for operative treatment of degenerative slip with radiculopathy L5/S1. Patient failed conservative treatments (please see the history and physical for the specifics) and had severe unremitting pain that affects sleep, daily activities and work/hobbies. After pre-op clearance, the patient was taken to the operating room on 07/27/2018 and underwent  Procedure(s): ANTERIOR LUMBAR FUSION L5-S1 ABDOMINAL EXPOSURE.    Patient was given perioperative antibiotics:  Anti-infectives (From admission, onward)   Start     Dose/Rate Route Frequency Ordered Stop   07/28/18 0800  emtricitabine-rilpivir-tenofovir AF (ODEFSEY) 200-25-25 MG per tablet 1 tablet     1 tablet Oral Daily with breakfast 07/27/18 1334     07/27/18 1345  emtricitabine-tenofovir AF (DESCOVY) 200-25 MG per tablet 1 tablet  Status:  Discontinued     1 tablet Oral Daily 07/27/18 1334 07/27/18 1409   07/27/18 1345  tenofovir (VIREAD) tablet  300 mg  Status:  Discontinued     300 mg Oral Daily 07/27/18 1334 07/27/18 1410   07/27/18 1345  dolutegravir (TIVICAY) tablet 50 mg     50 mg Oral Daily 07/27/18 1334     07/27/18 1345  ceFAZolin (ANCEF) IVPB 1 g/50 mL premix     1 g 100 mL/hr over 30 Minutes Intravenous Every 8 hours 07/27/18 1334 07/27/18 2100   07/27/18 0640  ceFAZolin (ANCEF) IVPB 2g/100 mL premix     2 g 200 mL/hr over 30 Minutes Intravenous 30 min pre-op 07/27/18 0640 07/27/18 0850       Patient was given sequential compression devices and early ambulation to prevent DVT.   Patient benefited maximally from hospital stay and there were no complications. At the time of discharge, the patient was urinating/moving their bowels without difficulty, tolerating a regular diet, pain is controlled with oral pain medications and they have been cleared by PT/OT.   Patient's hospital course has been uneventful.  She did complain of numbness and a sense of heaviness in the right proximal thigh.  This however is slowly improving.  Her gait pattern is stable and she is ambulating well with a cane.  She has no focal motor deficits except for some trace weakness of the hip flexors most likely brought on by pain from the surgical procedure.  She has 5 out of 5 EHL, tibialis anterior, and gastrocnemius strength bilaterally.  Recent vital signs:  Patient Vitals for the past 24 hrs:  BP Temp Temp src Pulse Resp SpO2  07/28/18  0734 102/60 97.7 F (36.5 C) Oral 62 16 100 %  07/28/18 0343 104/67 98.2 F (36.8 C) Oral 67 18 100 %  07/27/18 2311 110/68 98.5 F (36.9 C) Oral 64 20 100 %  07/27/18 1946 114/67 98.2 F (36.8 C) Oral 69 20 98 %  07/27/18 1324 126/74 97.9 F (36.6 C) Oral (!) 56 18 99 %  07/27/18 1255 124/70 (!) 97.5 F (36.4 C) - 63 16 100 %  07/27/18 1240 108/71 - - (!) 59 12 100 %  07/27/18 1225 124/70 - - 70 14 100 %  07/27/18 1155 - (!) 97.2 F (36.2 C) - - - -     Recent laboratory studies:  Recent Labs     07/26/18 0837  WBC 4.5  HGB 13.2  HCT 41.6  PLT 243  NA 141  K 3.5  CL 112*  CO2 23  BUN 12  CREATININE 1.21*  GLUCOSE 109*  CALCIUM 9.1     Discharge Medications:   Allergies as of 07/28/2018      Reactions   Flexeril [cyclobenzaprine] Rash   Moxifloxacin Rash   Tetracyclines & Related Itching   Tramadol Itching      Medication List    STOP taking these medications   Lido-Capsaicin-Men-Methyl Sal 4-0.373-11-29 % Ptch Commonly known as:  1ST MEDX-PATCH/ LIDOCAINE     TAKE these medications   benztropine 0.5 MG tablet Commonly known as:  COGENTIN Take 1 tablet (0.5 mg total) by mouth 2 (two) times daily.   COMPLERA 200-25-300 MG tablet Generic drug:  emtricitabine-rilpivir-tenofovir DF TAKE ONE TABLET BY MOUTH ONCE DAILY   DESCOVY 200-25 MG tablet Generic drug:  emtricitabine-tenofovir AF Take 1 tablet by mouth daily.   enoxaparin 40 MG/0.4ML injection Commonly known as:  LOVENOX Inject 0.4 mLs (40 mg total) into the skin daily for 10 days. 10 day supply 1 injection per day   gabapentin 300 MG capsule Commonly known as:  NEURONTIN Take 300 mg by mouth 3 (three) times daily.   loratadine 10 MG tablet Commonly known as:  CLARITIN Take 10 mg by mouth as needed (for seasonal allergies).   methocarbamol 500 MG tablet Commonly known as:  ROBAXIN Take 1 tablet (500 mg total) by mouth every 8 (eight) hours as needed for up to 5 days for muscle spasms.   ondansetron 4 MG tablet Commonly known as:  ZOFRAN Take 1 tablet (4 mg total) by mouth every 8 (eight) hours as needed for nausea or vomiting.   oxyCODONE-acetaminophen 10-325 MG tablet Commonly known as:  PERCOCET Take 1 tablet by mouth every 6 (six) hours as needed for up to 5 days for pain.   perphenazine 4 MG tablet Commonly known as:  TRILAFON Take 1 tablet (4 mg total) by mouth 3 (three) times daily.   PROAIR HFA 108 (90 Base) MCG/ACT inhaler Generic drug:  albuterol Inhale 2 puffs into the lungs  every 6 (six) hours as needed for shortness of breath.   rizatriptan 10 MG tablet Commonly known as:  MAXALT Take 10 mg by mouth as needed for migraine.   tenofovir 300 MG tablet Commonly known as:  VIREAD Take 300 mg by mouth daily.   TIVICAY 50 MG tablet Generic drug:  dolutegravir TAKE ONE TABLET BY MOUTH DAILY What changed:  how much to take   topiramate 50 MG tablet Commonly known as:  TOPAMAX Take 1 tablet (50 mg total) by mouth 3 (three) times daily.       Diagnostic  Studies: Dg Lumbar Spine 2-3 Views  Result Date: 07/27/2018 CLINICAL DATA:  L5-S1 ALIF EXAM: DG C-ARM 61-120 MIN; LUMBAR SPINE - 2-3 VIEW COMPARISON:  02/05/2017 radiography FINDINGS: Fluoroscopy for L5-S1 anterior lumbar interbody fusion. Hardware is in unremarkable position. No evident fracture. IMPRESSION: Fluoroscopy for L5-S1 anterior lumbar interbody fusion. No unexpected finding Electronically Signed   By: Marnee Spring M.D.   On: 07/27/2018 11:38   Dg C-arm 1-60 Min  Result Date: 07/27/2018 CLINICAL DATA:  L5-S1 ALIF EXAM: DG C-ARM 61-120 MIN; LUMBAR SPINE - 2-3 VIEW COMPARISON:  02/05/2017 radiography FINDINGS: Fluoroscopy for L5-S1 anterior lumbar interbody fusion. Hardware is in unremarkable position. No evident fracture. IMPRESSION: Fluoroscopy for L5-S1 anterior lumbar interbody fusion. No unexpected finding Electronically Signed   By: Marnee Spring M.D.   On: 07/27/2018 11:38   Vas [place] Lower Extremity Venous (dvt)  Result Date: 07/27/2018  Lower Venous Study Indications: ALIF.  Performing Technologist: Blanch Media RVS  Examination Guidelines: A complete evaluation includes B-mode imaging, spectral Doppler, color Doppler, and power Doppler as needed of all accessible portions of each vessel. Bilateral testing is considered an integral part of a complete examination. Limited examinations for reoccurring indications may be performed as noted.  Right Venous Findings:  +---------+---------------+---------+-----------+----------+-------+          CompressibilityPhasicitySpontaneityPropertiesSummary +---------+---------------+---------+-----------+----------+-------+ CFV      Full           Yes      Yes                          +---------+---------------+---------+-----------+----------+-------+ SFJ      Full                                                 +---------+---------------+---------+-----------+----------+-------+ FV Prox  Full                                                 +---------+---------------+---------+-----------+----------+-------+ FV Mid   Full                                                 +---------+---------------+---------+-----------+----------+-------+ FV DistalFull                                                 +---------+---------------+---------+-----------+----------+-------+ PFV      Full                                                 +---------+---------------+---------+-----------+----------+-------+ POP      Full           Yes      Yes                          +---------+---------------+---------+-----------+----------+-------+ PTV  Full                                                 +---------+---------------+---------+-----------+----------+-------+ PERO     Full                                                 +---------+---------------+---------+-----------+----------+-------+  Left Venous Findings: +---------+---------------+---------+-----------+----------+--------------+          CompressibilityPhasicitySpontaneityPropertiesSummary        +---------+---------------+---------+-----------+----------+--------------+ CFV      Full           Yes      Yes                                 +---------+---------------+---------+-----------+----------+--------------+ SFJ      Full                                                         +---------+---------------+---------+-----------+----------+--------------+ FV Prox  Full                                                        +---------+---------------+---------+-----------+----------+--------------+ FV Mid   Full                                                        +---------+---------------+---------+-----------+----------+--------------+ FV DistalFull                                                        +---------+---------------+---------+-----------+----------+--------------+ PFV      Full                                                        +---------+---------------+---------+-----------+----------+--------------+ POP      Full           Yes      Yes                                 +---------+---------------+---------+-----------+----------+--------------+ PTV      Full                                                        +---------+---------------+---------+-----------+----------+--------------+  PERO                                                  Not visualized +---------+---------------+---------+-----------+----------+--------------+    Summary: Right: There is no evidence of deep vein thrombosis in the lower extremity. No cystic structure found in the popliteal fossa. Left: There is no evidence of deep vein thrombosis in the lower extremity. No cystic structure found in the popliteal fossa.  *See table(s) above for measurements and observations. Electronically signed by Gretta Beganodd Early MD on 07/27/2018 at 8:04:57 PM.    Final    Dg Or Local Abdomen  Result Date: 07/27/2018 CLINICAL DATA:  ALIF L5-S1 EXAM: OR LOCAL ABDOMEN COMPARISON:  Lumbar spine radiographs 02/05/2017 FINDINGS: A single AP view demonstrates hardware at the L5-S1 level. Bowel gas pattern is normal. No unexpected radiopaque foreign body is present. IMPRESSION: 1. Lumbar fusion at L5-S1. 2. No unexpected radiopaque foreign body. Electronically Signed   By:  Marin Robertshristopher  Mattern M.D.   On: 07/27/2018 11:36    Discharge Instructions    Incentive spirometry RT   Complete by:  As directed       Follow-up Information    Venita LickBrooks, Wojciech Willetts, MD In 2 weeks.   Specialty:  Orthopedic Surgery Why:  If symptoms worsen, For suture removal, For wound re-check Contact information: [address] [place] [address] [phone]           Discharge Plan:  discharge to home  Disposition: stable    Signed: Alvy Bealahari D Donyae Kohn for Dr. Venita Lickahari Presten [facility] [phone] 07/28/2018, 10:20 AM

## 2018-08-02 MED FILL — Heparin Sodium (Porcine) Inj 1000 Unit/ML: INTRAMUSCULAR | Qty: 30 | Status: AC

## 2018-08-02 MED FILL — Sodium Chloride IV Soln 0.9%: INTRAVENOUS | Qty: 1000 | Status: AC

## 2018-08-31 ENCOUNTER — Other Ambulatory Visit: Payer: Self-pay

## 2018-08-31 DIAGNOSIS — Z113 Encounter for screening for infections with a predominantly sexual mode of transmission: Secondary | ICD-10-CM

## 2018-09-01 ENCOUNTER — Other Ambulatory Visit: Payer: Medicaid Other

## 2018-09-15 ENCOUNTER — Encounter: Payer: Medicaid Other | Admitting: Internal Medicine

## 2019-01-02 ENCOUNTER — Other Ambulatory Visit: Payer: Self-pay

## 2019-01-02 MED ORDER — TIVICAY 50 MG PO TABS: 50.0000 mg | ORAL_TABLET | Freq: Every day | ORAL | 3 refills | Status: DC

## 2019-01-02 MED ORDER — DESCOVY 200-25 MG PO TABS: 1.0000 | ORAL_TABLET | Freq: Every day | ORAL | 3 refills | Status: DC

## 2019-01-02 NOTE — Telephone Encounter (Signed)
Faxed received from [facility] for refill request for Tivicay. Patient also taking Descovy. E-refill sent to pharmacy and sent patient message via MyChart for needed follow up appointment.  Eugenia Mcalpine, LPN

## 2019-01-09 ENCOUNTER — Telehealth: Payer: Self-pay | Admitting: *Deleted

## 2019-01-09 DIAGNOSIS — B20 Human immunodeficiency virus [HIV] disease: Secondary | ICD-10-CM

## 2019-01-09 MED ORDER — TIVICAY 50 MG PO TABS: 50.0000 mg | ORAL_TABLET | Freq: Every day | ORAL | 0 refills | Status: DC

## 2019-01-09 MED ORDER — COMPLERA 200-25-300 MG PO TABS: 1.0000 | ORAL_TABLET | Freq: Every day | ORAL | 0 refills | Status: DC

## 2019-01-09 NOTE — Telephone Encounter (Signed)
[facility] called, needing clarification.  They transferred all of Tracy [name]'s medication in April, have been blister-packing her medications.  The pharmacist saw the new prescription for descovy/tivicay sent 01/02/19 rather than complera/tivicay, he called to confirm change before sending it out. Per chart review and discussion with Cassie Kuppelweiser, this is incorrect.  Patient needs to stay on complera/tivicay.  RN asked pharmacist to discontinue previous HIV medications, will send new prescription for correct regimen today.  Patient benefits financially from 90 day supply, RN sent 90 without refill.  Let pharmacist know that Wilhelmine needs an appointment.  Landis Gandy, RN

## 2019-01-09 NOTE — Telephone Encounter (Signed)
Agree  Thank you

## 2019-03-28 IMAGING — MR MR HEAD W/O CM
7 series · 39 of 48 positions shown · non-contrast
Comparison: CT perfusion December 29, 2016 at 7923 hours

CLINICAL DATA: Acute onset LEFT extremity weakness and numbness
beginning at 5135 hours. Slurred speech. Assess RIGHT MCA stroke.
History of HIV and chronic headaches.

EXAM:
MRI HEAD WITHOUT CONTRAST
TECHNIQUE: Multiplanar, multiecho pulse sequences of the brain and surrounding
structures were obtained without intravenous contrast. Axial T1,
axial MPGR from at axial T1 and coronal T2 sequences not obtained.
Patient was unable to complete examination due to back pain.

[Series 3: T1 · sagittal · 5.0mm · 0.47mm/px · 3 of 24 slices shown]
[im 1/24]
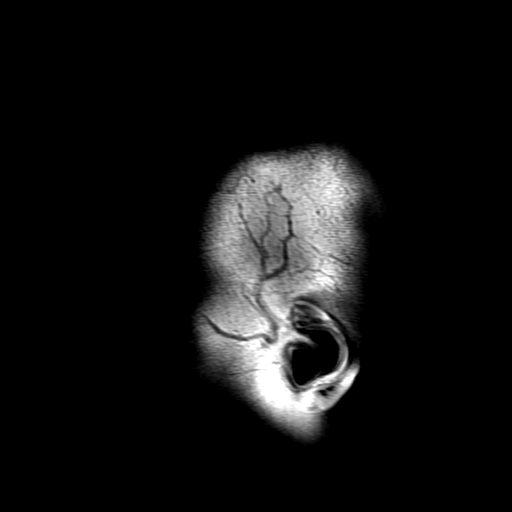
[im 8/24]
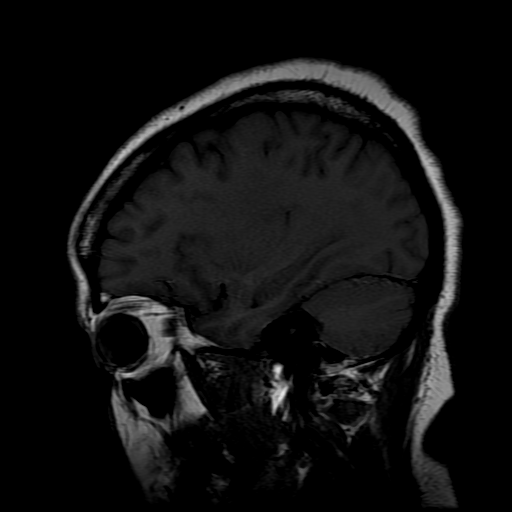
[im 16/24]
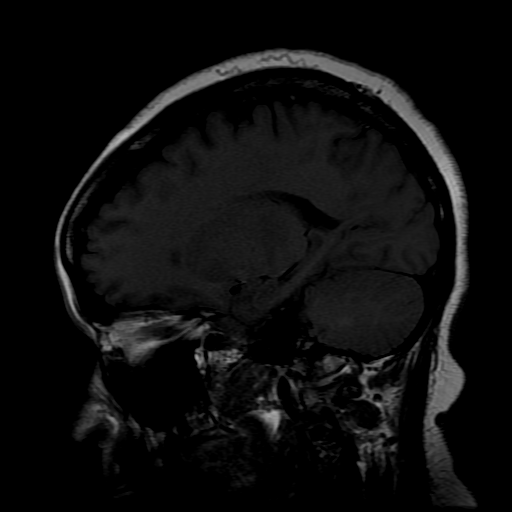

[Series 4: DWI · axial · 3.0mm · 1.09mm/px · z∈[-133,+17]mm · 9 of 106 slices shown (1 of 4)]
[im 1/106]
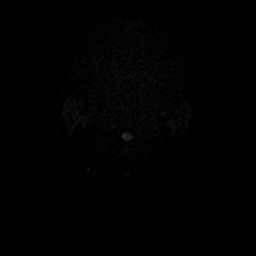
[im 16/106]
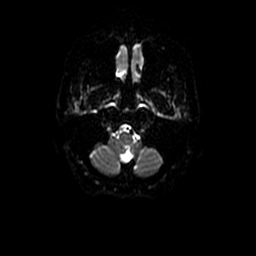
[im 31/106]
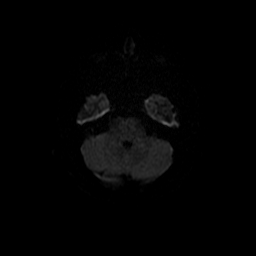
[im 46/106]
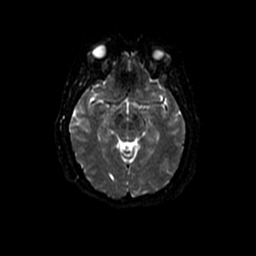
[im 53/106]
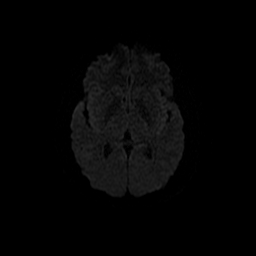
[im 61/106]
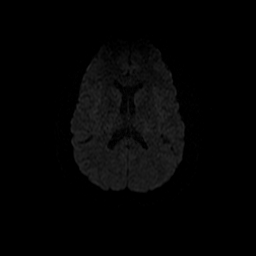
[im 76/106]
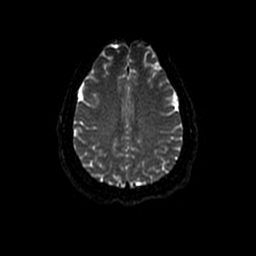
[im 91/106]
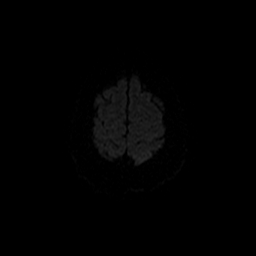
[im 106/106]
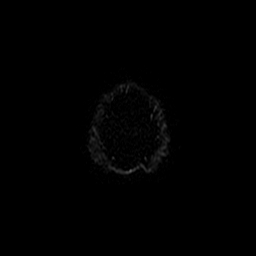

[Series 5: T2 · axial · 5.0mm · 0.47mm/px · z∈[-122,+29]mm · 4 of 27 slices shown]
[im 1/27]
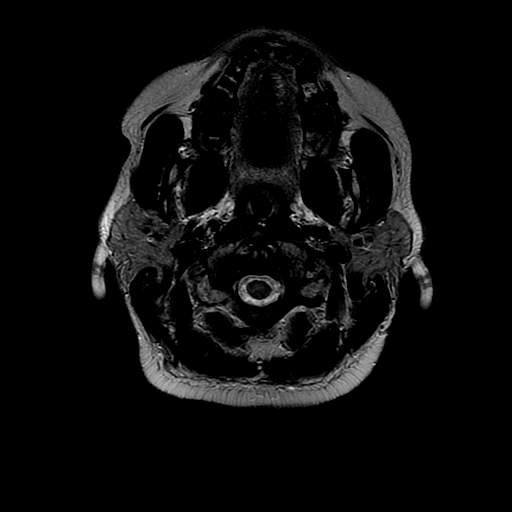
[im 9/27]
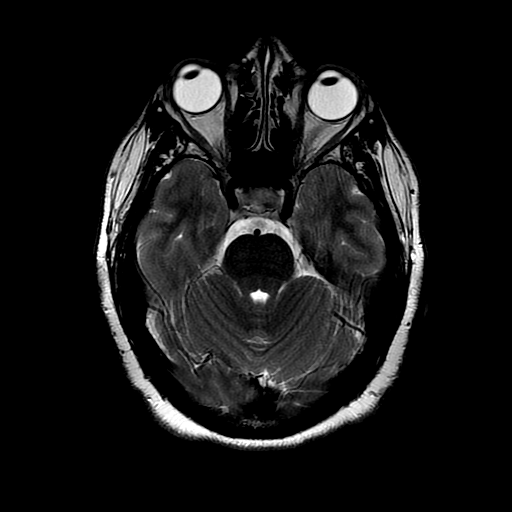
[im 18/27]
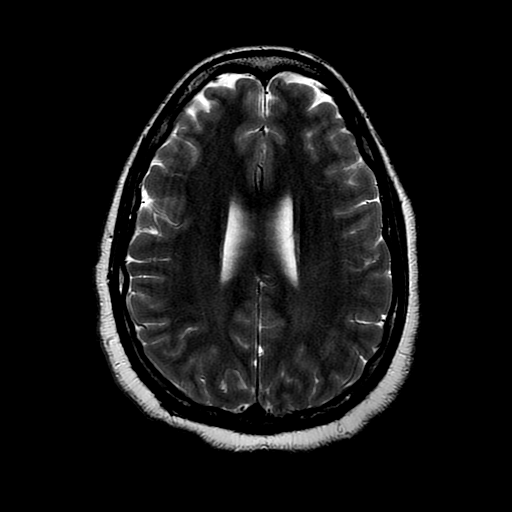
[im 27/27]
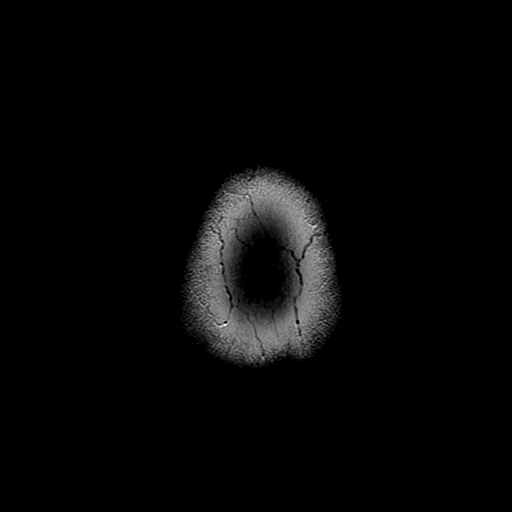

[Series 6: DWI · coronal · 5.0mm · 1.09mm/px · 8 of 74 slices shown (2 of 4)]
[im 1/74]
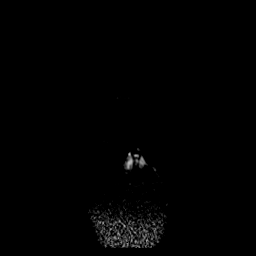
[im 9/74]
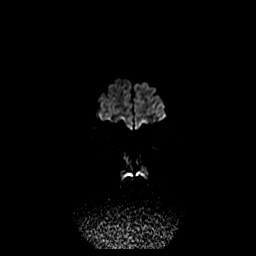
[im 25/74]
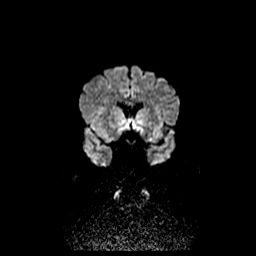
[im 33/74]
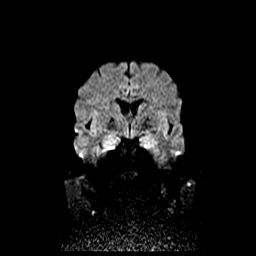
[im 41/74]
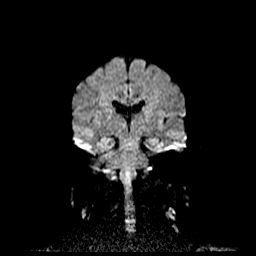
[im 49/74]
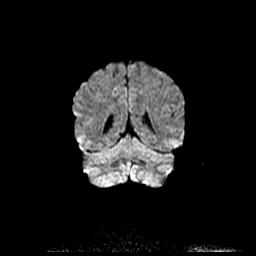
[im 65/74]
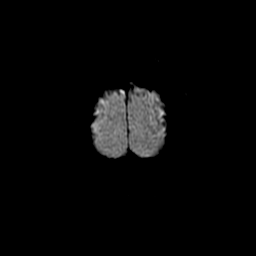
[im 74/74]
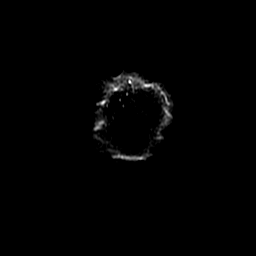

[Series 7: FLAIR · axial · 5.0mm · 0.47mm/px · z∈[-121,+28]mm · 3 of 23 slices shown]
[im 1/23]
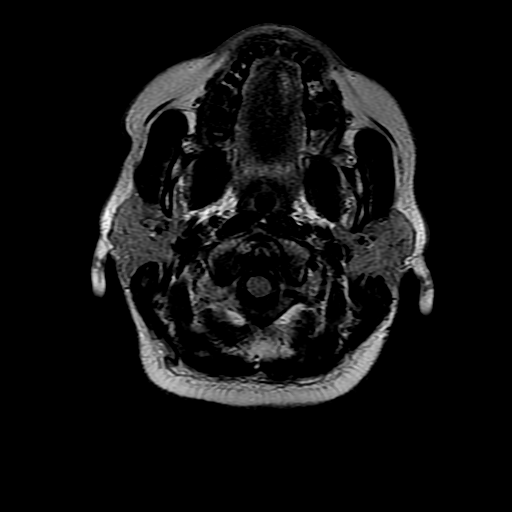
[im 12/23]
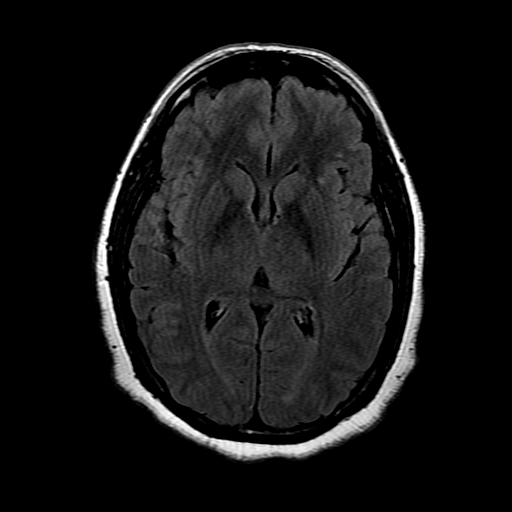
[im 23/23]
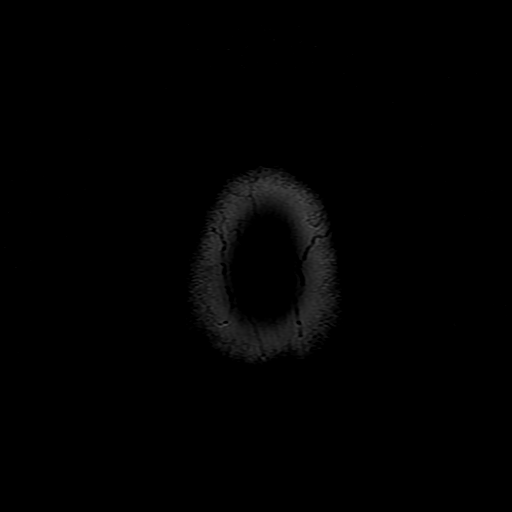

[Series 400: DWI · axial · 3.0mm · 1.09mm/px · z∈[-133,+17]mm · 7 of 53 slices shown (3 of 4)]
[im 1/53]
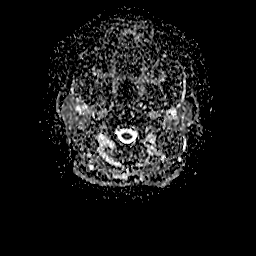
[im 9/53]
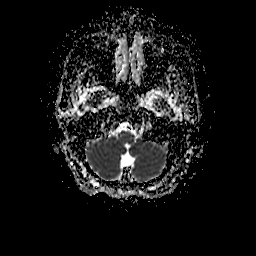
[im 18/53]
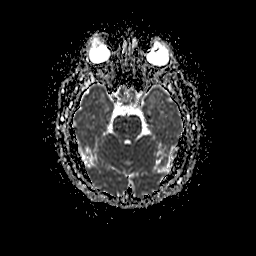
[im 27/53]
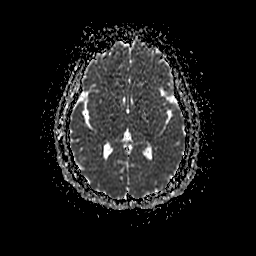
[im 35/53]
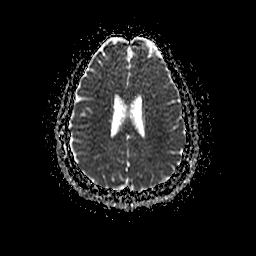
[im 44/53]
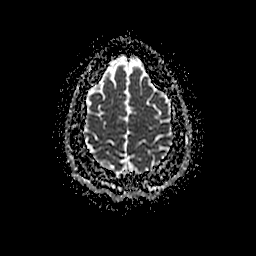
[im 53/53]
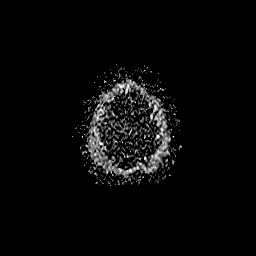

[Series 600: DWI · coronal · 5.0mm · 1.09mm/px · 5 of 37 slices shown (4 of 4)]
[im 1/37]
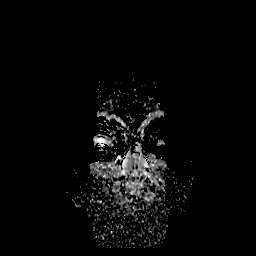
[im 10/37]
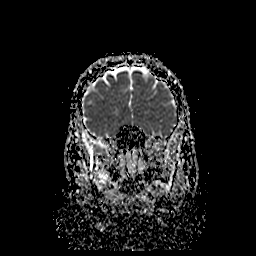
[im 19/37]
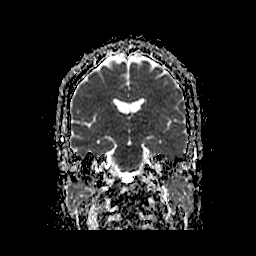
[im 28/37]
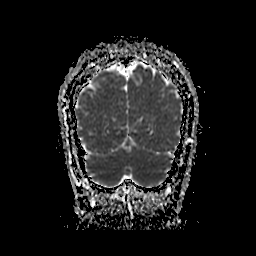
[im 37/37]
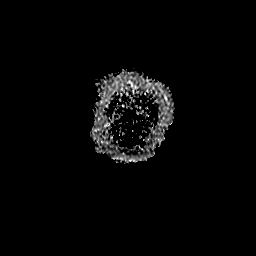

[39 of 48 positions shown; findings below may reference images not displayed]

FINDINGS: BRAIN: No reduced diffusion to suggest acute ischemia. No
susceptibility artifact to suggest hemorrhage. The ventricles and
sulci are normal for patient's age. A few scattered subcentimeter
supratentorial nonspecific white matter T2 hyperintensities, normal
for age. No suspicious parenchymal signal, masses or mass effect. No
abnormal extra-axial fluid collections.

VASCULAR: Normal major intracranial vascular flow voids present at
skull base.

SKULL AND UPPER CERVICAL SPINE: No abnormal sellar expansion. No
suspicious calvarial bone marrow signal. Craniocervical junction
maintained.

SINUSES/ORBITS: The mastoid air-cells and included paranasal sinuses
are well-aerated. The included ocular globes and orbital contents
are non-suspicious. Old LEFT medial orbital blowout fracture.

OTHER: None.
IMPRESSION: No acute intracranial process ; negative limited noncontrast MRI of
the head (truncated examination due to patient pain).

## 2020-08-11 DIAGNOSIS — Z90710 Acquired absence of both cervix and uterus: Secondary | ICD-10-CM | POA: Insufficient documentation

## 2021-07-28 ENCOUNTER — Other Ambulatory Visit: Payer: Self-pay

## 2021-07-28 ENCOUNTER — Other Ambulatory Visit: Payer: Medicaid Other

## 2021-07-28 ENCOUNTER — Other Ambulatory Visit (HOSPITAL_COMMUNITY)
Admission: RE | Admit: 2021-07-28 | Discharge: 2021-07-28 | Disposition: A | Discharge status: Home / Self Care | Payer: Medicaid Other | Source: Ambulatory Visit | Attending: Internal Medicine | Admitting: Internal Medicine

## 2021-07-28 DIAGNOSIS — Z113 Encounter for screening for infections with a predominantly sexual mode of transmission: Secondary | ICD-10-CM | POA: Insufficient documentation

## 2021-07-28 DIAGNOSIS — B20 Human immunodeficiency virus [HIV] disease: Secondary | ICD-10-CM | POA: Diagnosis present

## 2021-07-28 DIAGNOSIS — Z79899 Other long term (current) drug therapy: Secondary | ICD-10-CM

## 2021-07-29 LAB — T-HELPER CELL (CD4) - (RCID CLINIC ONLY)
CD4 % Helper T Cell: 51 % (ref 33–65)
CD4 T Cell Abs: 1357 /uL (ref 400–1790)

## 2021-07-29 LAB — URINE CYTOLOGY ANCILLARY ONLY
Chlamydia: NEGATIVE
Comment: NEGATIVE
Comment: NORMAL
Neisseria Gonorrhea: NEGATIVE

## 2021-07-30 LAB — COMPLETE METABOLIC PANEL WITH GFR
AG Ratio: 1.9 (calc) (ref 1.0–2.5)
ALT: 16 U/L (ref 6–29)
AST: 18 U/L (ref 10–35)
Albumin: 4.5 g/dL (ref 3.6–5.1)
Alkaline phosphatase (APISO): 81 U/L (ref 37–153)
BUN/Creatinine Ratio: 10 (calc) (ref 6–22)
BUN: 13 mg/dL (ref 7–25)
CO2: 25 mmol/L (ref 20–32)
Calcium: 10.2 mg/dL (ref 8.6–10.4)
Chloride: 109 mmol/L (ref 98–110)
Creat: 1.36 mg/dL — ABNORMAL HIGH (ref 0.50–1.03)
Globulin: 2.4 g/dL (calc) (ref 1.9–3.7)
Glucose, Bld: 85 mg/dL (ref 65–99)
Potassium: 4.2 mmol/L (ref 3.5–5.3)
Sodium: 143 mmol/L (ref 135–146)
Total Bilirubin: 0.4 mg/dL (ref 0.2–1.2)
Total Protein: 6.9 g/dL (ref 6.1–8.1)
eGFR: 46 mL/min/{1.73_m2} — ABNORMAL LOW (ref >=60)

## 2021-07-30 LAB — CBC WITH DIFFERENTIAL/PLATELET
Absolute Monocytes: 439 cells/uL (ref 200–950)
Basophils Absolute: 31 cells/uL (ref 0–200)
Basophils Relative: 0.6 %
Eosinophils Absolute: 61 cells/uL (ref 15–500)
Eosinophils Relative: 1.2 %
HCT: 40.8 % (ref 35.0–45.0)
Hemoglobin: 14.3 g/dL (ref 11.7–15.5)
Lymphs Abs: 2749 cells/uL (ref 850–3900)
MCH: 31.8 pg (ref 27.0–33.0)
MCHC: 35 g/dL (ref 32.0–36.0)
MCV: 90.7 fL (ref 80.0–100.0)
MPV: 11.3 fL (ref 7.5–12.5)
Monocytes Relative: 8.6 %
Neutro Abs: 1821 cells/uL (ref 1500–7800)
Neutrophils Relative %: 35.7 %
Platelets: 273 10*3/uL (ref 140–400)
RBC: 4.5 10*6/uL (ref 3.80–5.10)
RDW: 12.7 % (ref 11.0–15.0)
Total Lymphocyte: 53.9 %
WBC: 5.1 10*3/uL (ref 3.8–10.8)

## 2021-07-30 LAB — HIV-1 RNA QUANT-NO REFLEX-BLD
HIV 1 RNA Quant: NOT DETECTED Copies/mL
HIV-1 RNA Quant, Log: NOT DETECTED Log cps/mL

## 2021-07-30 LAB — RPR: RPR Ser Ql: NONREACTIVE

## 2021-08-08 ENCOUNTER — Other Ambulatory Visit (HOSPITAL_COMMUNITY): Payer: Self-pay

## 2021-08-12 ENCOUNTER — Ambulatory Visit (INDEPENDENT_AMBULATORY_CARE_PROVIDER_SITE_OTHER): Payer: Medicaid Other | Admitting: Internal Medicine

## 2021-08-12 ENCOUNTER — Other Ambulatory Visit: Payer: Self-pay

## 2021-08-12 VITALS — BP 130/91 | HR 94 | Temp 98.1°F | Wt 223.8 lb

## 2021-08-12 DIAGNOSIS — Z205 Contact with and (suspected) exposure to viral hepatitis: Secondary | ICD-10-CM | POA: Diagnosis not present

## 2021-08-12 DIAGNOSIS — Z23 Encounter for immunization: Secondary | ICD-10-CM | POA: Diagnosis not present

## 2021-08-12 DIAGNOSIS — Z7185 Encounter for immunization safety counseling: Secondary | ICD-10-CM | POA: Insufficient documentation

## 2021-08-12 DIAGNOSIS — G894 Chronic pain syndrome: Secondary | ICD-10-CM

## 2021-08-12 DIAGNOSIS — B20 Human immunodeficiency virus [HIV] disease: Secondary | ICD-10-CM | POA: Diagnosis present

## 2021-08-12 MED ORDER — COMPLERA 200-25-300 MG PO TABS: 1.0000 | ORAL_TABLET | Freq: Every day | ORAL | 1 refills | Status: DC

## 2021-08-12 MED ORDER — TIVICAY 50 MG PO TABS: 50.0000 mg | ORAL_TABLET | Freq: Every day | ORAL | 1 refills | Status: DC

## 2021-08-12 MED ORDER — GABAPENTIN 800 MG PO TABS: 800.0000 mg | ORAL_TABLET | Freq: Three times a day (TID) | ORAL | 5 refills | Status: DC

## 2021-08-12 NOTE — Assessment & Plan Note (Addendum)
She is doing well on her current regimen of Tivicay and Complera without complications and her viral load remains suppressed with healthy CD4 count.  Refills sent on medication today and will follow up in about 6 months. Will also refer to internal medicine for establishing with PCP to help manage other chronic conditions.

## 2021-08-12 NOTE — Assessment & Plan Note (Signed)
She received her updated PCV 20 vaccine today.

## 2021-08-12 NOTE — Assessment & Plan Note (Signed)
I refilled her gabapentin 800mg  TID that she is taking chronically.

## 2021-08-12 NOTE — Patient Instructions (Signed)
Thank you for coming to see me today. It was a pleasure seeing you.  To Do: Continue your HIV medications which I refilled today Will check your hepatitis immunity today I also placed a referral to internal medicine for you to establish with a Primary Care. Follow up in 6 months.  If you have any questions or concerns, please do not hesitate to call the office at [phone].  Take Care,   Gwynn Burly

## 2021-08-12 NOTE — Assessment & Plan Note (Signed)
Her husband has untreated hepatitis C.  Will check her HCV Ab to determine evidence of chronic infection.  Will also check immunity to hepatitis A and B.

## 2021-08-12 NOTE — Progress Notes (Signed)
[facility]   CHIEF COMPLAINT    HIV follow up.    SUBJECTIVE:    Tracy [name] is a 56 y.o. female with PMHx as below who presents to the clinic for HIV follow up.   Patient is here for routine follow-up today.  She was previously seen in 2019 at our clinic by Dr. Megan Salon.  She was subsequently transitioned her care to another ID clinic in [place], [place].  She is returning to care here and with her husband today whom is also HIV positive.  At that time she was on dolutegravir and Complera.  She continues on this regimen currently without any issues.  She had routine labs drawn on 07/28/2021 that indicated a creatinine of 1.3 compared to 1.2 in January 2020.  Her viral load was undetectable.  Her CD4 count was greater than 1000.  Her RPR was nonreactive and her urine cytology was negative for gonorrhea or chlamydia.  Please see A&P for the details of today's visit and status of the patient's medical problems.   Patient's Medications  New Prescriptions   GABAPENTIN (NEURONTIN) 800 MG TABLET    Take 1 tablet (800 mg total) by mouth 3 (three) times daily.  Previous Medications   BUPROPION (WELLBUTRIN XL) 150 MG 24 HR TABLET    Take 150 mg by mouth every morning.   CALCIUM 600-5 MG-MCG TABS    Take by mouth.   DOXEPIN (SINEQUAN) 25 MG CAPSULE    Take 25 mg by mouth.   QUETIAPINE (SEROQUEL XR) 300 MG 24 HR TABLET       RIVAROXABAN (XARELTO) 20 MG TABS TABLET    Xarelto 20 mg tablet  TAKE 1 TABLET BY MOUTH WITH SUPPER  Modified Medications   Modified Medication Previous Medication   DOLUTEGRAVIR (TIVICAY) 50 MG TABLET dolutegravir (TIVICAY) 50 MG tablet      Take 1 tablet (50 mg total) by mouth daily.    Take 1 tablet (50 mg total) by mouth daily.   EMTRICITABINE-RILPIVIR-TENOFOVIR DF (COMPLERA) 200-25-300 MG TABLET emtricitabine-rilpivir-tenofovir DF (COMPLERA) 200-25-300 MG tablet      Take 1 tablet by mouth daily.    Take 1 tablet by mouth  daily.  Discontinued Medications   BENZTROPINE (COGENTIN) 0.5 MG TABLET    Take 1 tablet (0.5 mg total) by mouth 2 (two) times daily.   GABAPENTIN (NEURONTIN) 300 MG CAPSULE    Take 300 mg by mouth 3 (three) times daily.   LORATADINE (CLARITIN) 10 MG TABLET    Take 10 mg by mouth as needed (for seasonal allergies).    ONDANSETRON (ZOFRAN) 4 MG TABLET    Take 1 tablet (4 mg total) by mouth every 8 (eight) hours as needed for nausea or vomiting.   PERPHENAZINE (TRILAFON) 4 MG TABLET    Take 1 tablet (4 mg total) by mouth 3 (three) times daily.   PROAIR HFA 108 (90 BASE) MCG/ACT INHALER    Inhale 2 puffs into the lungs every 6 (six) hours as needed for shortness of breath.    RIZATRIPTAN (MAXALT) 10 MG TABLET    Take 10 mg by mouth as needed for migraine.    TOPIRAMATE (TOPAMAX) 50 MG TABLET    Take 1 tablet (50 mg total) by mouth 3 (three) times daily.      Past Medical History:  Diagnosis Date   Arthritis    Chronic back pain    Chronic headaches  Depression    HIV infection ([place])    Joint pain    Localized swelling of both lower legs    Legs, feet and hands   Schizoaffective disorder (HCC)    Seasonal allergies    Stroke (cerebrum) ([place])    patient denies, states it was a heat stroke   Weight loss     Social History   Tobacco Use   Smoking status: Former    Packs/day: 0.30    Types: Cigarettes    Quit date: 04/26/2016    Years since quitting: 5.2   Smokeless tobacco: Never  Vaping Use   Vaping Use: Former  Substance Use Topics   Alcohol use: No    Alcohol/week: 0.0 standard drinks    Comment: quit 12/2005   Drug use: Yes    Types: "Crack" cocaine    Comment: Previous drug use, quit 2004-05; relapsed 05/2018, clean since 06/08/2018    Family History  Problem Relation Age of Onset   Diabetes Mother    Hypertension Mother    Tremor Mother    Breast cancer Maternal Aunt    High blood pressure Sister    High blood pressure Maternal Uncle    Diabetes Maternal Uncle      Allergies  Allergen Reactions   Flexeril [Cyclobenzaprine] Rash   Moxifloxacin Rash   Tetracyclines & Related Itching   Tramadol Itching    Review of Systems  Constitutional: Negative.   Respiratory: Negative.    Cardiovascular: Negative.   Musculoskeletal:  Positive for back pain.    OBJECTIVE:    Vitals:   08/12/21 1344  BP: (!) 130/91  Pulse: 94  Temp: 98.1 F (36.7 C)  TempSrc: Temporal  SpO2: 97%  Weight: 223 lb 12.8 oz (101.5 kg)     Body mass index is 34.53 kg/m.  Physical Exam Constitutional:      General: She is not in acute distress.    Appearance: Normal appearance.  HENT:     Head: Normocephalic and atraumatic.  Eyes:     Extraocular Movements: Extraocular movements intact.     Conjunctiva/sclera: Conjunctivae normal.  Pulmonary:     Effort: Pulmonary effort is normal. No respiratory distress.  Abdominal:     General: There is no distension.     Palpations: Abdomen is soft.     Tenderness: There is no abdominal tenderness.  Musculoskeletal:        General: Normal range of motion.  Skin:    General: Skin is warm and dry.  Neurological:     General: No focal deficit present.     Mental Status: She is alert and oriented to person, place, and time.  Psychiatric:        Mood and Affect: Mood normal.        Behavior: Behavior normal.    Labs and Microbiology: CMP Latest Ref Rng & Units 07/28/2021 07/26/2018 06/20/2018  Glucose 65 - 99 mg/dL 85 109(H) 82  BUN 7 - 25 mg/dL 13 12 13   Creatinine 0.50 - 1.03 mg/dL 1.36(H) 1.21(H) 0.96  Sodium 135 - 146 mmol/L 143 141 139  Potassium 3.5 - 5.3 mmol/L 4.2 3.5 3.6  Chloride 98 - 110 mmol/L 109 112(H) 103  CO2 20 - 32 mmol/L 25 23 22   Calcium 8.6 - 10.4 mg/dL 10.2 9.1 9.3  Total Protein 6.1 - 8.1 g/dL 6.9 6.5 7.2  Total Bilirubin 0.2 - 1.2 mg/dL 0.4 0.7 1.4(H)  Alkaline Phos 38 - 126 U/L - 49 60  AST 10 - 35 U/L 18 17 24   ALT 6 - 29 U/L 16 16 22    CBC Latest Ref Rng & Units 07/28/2021 07/26/2018  06/20/2018  WBC 3.8 - 10.8 Thousand/uL 5.1 4.5 7.9  Hemoglobin 11.7 - 15.5 g/dL 14.3 13.2 13.8  Hematocrit 35.0 - 45.0 % 40.8 41.6 43.3  Platelets 140 - 400 Thousand/uL 273 243 324     Lab Results  Component Value Date   HIV1RNAQUANT Not Detected 07/28/2021   HIV1RNAQUANT 49 (H) 03/02/2018   HIV1RNAQUANT <20 NOT DETECTED 07/28/2017   CD4TABS 1,357 07/28/2021   CD4TABS 1,120 03/02/2018   CD4TABS 1,100 07/28/2017    RPR and STI: Lab Results  Component Value Date   LABRPR NON-REACTIVE 07/28/2021   LABRPR NON-REACTIVE 07/28/2017   LABRPR NON-REACTIVE 04/07/2017   LABRPR NON REAC 09/19/2015    STI Results GC CT  07/28/2021 Negative Negative  09/19/2015 Negative Negative    Hepatitis B: No results found for: HEPBSAB, HEPBSAG, HEPBCAB Hepatitis C: No results found for: HEPCAB, HCVRNAPCRQN Hepatitis A: No results found for: HAV Lipids: Lab Results  Component Value Date   CHOL 188 06/21/2018   TRIG 137 06/21/2018   HDL 76 06/21/2018   CHOLHDL 2.5 06/21/2018   VLDL 27 06/21/2018   LDLCALC 85 06/21/2018       ASSESSMENT & PLAN:    HIV disease ([place]) She is doing well on her current regimen of Tivicay and Complera without complications and her viral load remains suppressed with healthy CD4 count.  Refills sent on medication today and will follow up in about 6 months. Will also refer to internal medicine for establishing with PCP to help manage other chronic conditions.   Vaccine counseling She received her updated PCV 20 vaccine today.   Exposure to hepatitis C Her husband has untreated hepatitis C.  Will check her HCV Ab to determine evidence of chronic infection.  Will also check immunity to hepatitis A and B.   Chronic pain syndrome I refilled her gabapentin 800mg  TID that she is taking chronically.    Orders Placed This Encounter  Procedures   Pneumococcal conjugate vaccine 20-valent   Hepatitis B surface antibody,qualitative   Hepatitis B surface antigen    Hepatitis B core antibody, total   Hepatitis C antibody   Hepatitis A antibody, total   Ambulatory referral to Internal Medicine    Referral Priority:   Routine    Referral Type:   Consultation    Referral Reason:   Specialty Services Required    Requested Specialty:   Internal Medicine    Number of Visits Requested:   [facility] 08/12/2021, 2:38 PM   I spent 40 minutes dedicated to the care of this patient on the date of this encounter to include pre-visit review of records, face-to-face time with the patient discussing HIV, vaccines, hepatitis, and post-visit ordering of testing.

## 2021-08-13 LAB — HEPATITIS B SURFACE ANTIGEN: Hepatitis B Surface Ag: NONREACTIVE

## 2021-08-13 LAB — HEPATITIS B SURFACE ANTIBODY,QUALITATIVE: Hep B S Ab: NONREACTIVE

## 2021-08-13 LAB — HEPATITIS B CORE ANTIBODY, TOTAL: Hep B Core Total Ab: NONREACTIVE

## 2021-08-13 LAB — HEPATITIS C ANTIBODY
Hepatitis C Ab: NONREACTIVE
SIGNAL TO CUT-OFF: 0.05 (ref <1.00)

## 2021-08-13 LAB — HEPATITIS A ANTIBODY, TOTAL: Hepatitis A AB,Total: REACTIVE — AB

## 2021-08-26 ENCOUNTER — Other Ambulatory Visit: Payer: Self-pay

## 2021-08-26 ENCOUNTER — Ambulatory Visit (INDEPENDENT_AMBULATORY_CARE_PROVIDER_SITE_OTHER): Payer: Medicaid Other

## 2021-08-26 DIAGNOSIS — Z23 Encounter for immunization: Secondary | ICD-10-CM

## 2021-08-28 ENCOUNTER — Telehealth: Payer: Self-pay

## 2021-08-28 NOTE — Telephone Encounter (Signed)
Surgical clearance document request given to Dr. Orvan Falconer for review. He may defer this be filled out by Dr. Earlene Plater as patient was last seen by him recently. Copy of document in triage if needed. Patient understands that Dr. Earlene Plater will not be back in the office until late Feb. She is hopeful that forms can e competed soon.  Valarie Cones

## 2021-08-28 NOTE — Telephone Encounter (Signed)
Patient aware surgical clearance form has been completed and faxed to [facility].     [place], CMA

## 2021-08-28 NOTE — Telephone Encounter (Signed)
Patient left a voice mail to get a call back on an update on paper work that was dropped off to get filled out for Dr. Shon Baton with [facility]. Want to know if she needs to schedule an appointment to have them filled out with Dr. Orvan Falconer being that Dr. Earlene Plater is not available, best contact number is [phone]

## 2021-08-29 ENCOUNTER — Ambulatory Visit: Payer: Self-pay | Admitting: Orthopedic Surgery

## 2021-08-29 DIAGNOSIS — Z01818 Encounter for other preprocedural examination: Secondary | ICD-10-CM

## 2021-09-10 ENCOUNTER — Emergency Department (HOSPITAL_COMMUNITY)
Admission: EM | Admit: 2021-09-10 | Discharge: 2021-09-11 | Disposition: A | Discharge status: Home / Self Care | Payer: Medicaid Other | Attending: Emergency Medicine | Admitting: Emergency Medicine

## 2021-09-10 ENCOUNTER — Emergency Department (HOSPITAL_COMMUNITY): Payer: Medicaid Other

## 2021-09-10 ENCOUNTER — Other Ambulatory Visit: Payer: Self-pay

## 2021-09-10 DIAGNOSIS — M79604 Pain in right leg: Secondary | ICD-10-CM | POA: Insufficient documentation

## 2021-09-10 DIAGNOSIS — W01198A Fall on same level from slipping, tripping and stumbling with subsequent striking against other object, initial encounter: Secondary | ICD-10-CM | POA: Diagnosis not present

## 2021-09-10 DIAGNOSIS — Y92512 Supermarket, store or market as the place of occurrence of the external cause: Secondary | ICD-10-CM | POA: Insufficient documentation

## 2021-09-10 DIAGNOSIS — W19XXXA Unspecified fall, initial encounter: Secondary | ICD-10-CM

## 2021-09-10 DIAGNOSIS — S0990XA Unspecified injury of head, initial encounter: Secondary | ICD-10-CM | POA: Insufficient documentation

## 2021-09-10 DIAGNOSIS — M25511 Pain in right shoulder: Secondary | ICD-10-CM | POA: Insufficient documentation

## 2021-09-10 DIAGNOSIS — Z7901 Long term (current) use of anticoagulants: Secondary | ICD-10-CM | POA: Insufficient documentation

## 2021-09-10 NOTE — ED Triage Notes (Signed)
Pt had mechanical fall at [employer] just PTA, tripping over a box an employee threw on the ground right in front of her. No LOC, did not hit head. C/o R shoulder and R calf pain.  ?

## 2021-09-10 NOTE — ED Provider Triage Note (Signed)
Emergency Medicine Provider Triage Evaluation Note ? ?Tracy [name] , a 56 y.o. female  was evaluated in triage.  Pt complains of mechanical fall earlier today at [employer].  Patient reports that [employer] employee threw box behind her causing her to trip.  Patient now here complaining of right shoulder pain, headache, right leg pain.  Patient states that she takes a blood thinner.  Patient unsure if she hit her head. ? ?Review of Systems  ?Positive: Right shoulder pain, right leg pain, headache ?Negative: Nausea vomiting, dizziness, weakness, lightheadedness, loss of consciousness ? ?Physical Exam  ?BP (!) 167/99   Pulse 91   Temp 98.8 ?F (37.1 ?C) (Oral)   Resp (!) 21   SpO2 97%  ?Gen:   Awake, no distress   ?Resp:  Normal effort  ?MSK:   Moves extremities without difficulty  ?Other:  No focal neurodeficits noted. ? ?Medical Decision Making  ?Medically screening exam initiated at 6:36 PM.  Appropriate orders placed.  Tracy [name] was informed that the remainder of the evaluation will be completed by another provider, this initial triage assessment does not replace that evaluation, and the importance of remaining in the ED until their evaluation is complete. ? ? ?  ?Tracy Cecil, PA-C ?09/10/21 1837 ? ?

## 2021-09-11 NOTE — ED Notes (Signed)
Pt verbalized understanding of d/c instructions, meds, and followup care. Denies questions. VSS, no distress noted. Steady gait to exit with all belongings.  ?

## 2021-09-11 NOTE — Progress Notes (Signed)
Orthopedic Tech Progress Note ?Patient Details:  ?Seylah Wernert ?[birth year] ?[id number] ? ?Ortho Devices ?Type of Ortho Device: Sling immobilizer, CAM walker ?Ortho Device/Splint Location: RLE,RUE ?Ortho Device/Splint Interventions: Ordered, Application, Adjustment ?  ?Post Interventions ?Patient Tolerated: Well ?Instructions Provided: Care of device ? ?Donald Pore ?09/11/2021, 2:04 AM ? ?

## 2021-09-11 NOTE — ED Provider Notes (Signed)
?[facility] DEPT ?[facility] Emergency Department ?Provider Note ?MRN:  [id number]  ?Arrival date & time: 09/11/21    ? ?Chief Complaint   ?Fall ?  ?History of Present Illness   ?Tracy [name] is a 56 y.o. year-old female presents to the ED with chief complaint of fall.  She states that she was at [employer] and stepped on a crate or box that a store employee had been using.  She slipped and fell to the ground.  She hit here head and is anticoagulated on Xarelto. She states that her right shoulder and right ankle hurt. ? ?History provided by patient. ? ?Review of Systems  ?Pertinent review of systems noted in HPI.  ? ? ?Physical Exam  ? ?Vitals:  ? 09/10/21 2326 09/11/21 0130  ?BP: (!) 137/94 139/83  ?Pulse: 70 66  ?Resp: 16 16  ?Temp: 97.8 ?F (36.6 ?C)   ?SpO2: 100% 96%  ?  ?CONSTITUTIONAL:  well-appearing, NAD ?NEURO:  Alert and oriented x 3, CN 3-12 grossly intact ?EYES:  eyes equal and reactive ?ENT/NECK:  Supple, no stridor  ?CARDIO:  Normal rate, regular rhythm, appears well-perfused  ?PULM:  No respiratory distress, CTAB ?GI/GU:  non-distended,  ?MSK/SPINE:  No gross deformities, no edema, moves all extremities, but limited ROM of right shoulder, right calf contusion ?SKIN:  no rash, atraumatic ? ? ?*Additional and/or pertinent findings included in MDM below ? ?Diagnostic and Interventional Summary  ? ? EKG Interpretation ? ?Date/Time:    ?Ventricular Rate:    ?PR Interval:    ?QRS Duration:   ?QT Interval:    ?QTC Calculation:   ?R Axis:     ?Text Interpretation:   ?  ? ?  ? ?Labs Reviewed - No data to display  ?CT Head Wo Contrast  ?Final Result  ?  ?CT Cervical Spine Wo Contrast  ?Final Result  ?  ?DG Shoulder Right  ?Final Result  ?  ?DG Tibia/Fibula Right  ?Final Result  ?  ?  ?Medications - No data to display  ? ?Procedures  /  Critical Care ?Procedures ? ?ED Course and Medical Decision Making  ?I have reviewed the triage vital signs, the nursing notes, and pertinent available records from the  EMR. ? ?Complexity of Problems Addressed: ?Moderate Complexity: Acute complicated illness or injury, requiring diagnostic workup as ordered and performed below. ? ?ED Course: ?After considering the following differential, fall, I agree with workup that was ordered in triage. ?I visualized the   x-ray which is notable for no obvious fx or dislocation and agree with the radiologist interpretation. ?I visualized the CT, which is notable for no intracranial bleeding and agree with radiologist interpretation.. ? ?Clinical Course as of 09/11/21 0145  ?Thu Sep 11, 2021  ?[facility] room multiple times, still not located in room. [RB]  ?  ?Clinical Course User Index ?[RB] Montine Circle, PA-C  ? ? ?Social Determinants Affecting Care: ? ? ? ? ?Consultants: ?No consultations were needed in caring for this patient. ? ?Treatment and Plan: ?Treat with sling and cam walker.  Recommend outpatient orthopedic follow-up. ? ?Emergency department workup does not suggest an emergent condition requiring admission or immediate intervention beyond  what has been performed at this time. The patient is safe for discharge and has  been instructed to return immediately for worsening symptoms, change in  symptoms or any other concerns ? ? ? ?Final Clinical Impressions(s) / ED Diagnoses  ? ?  ICD-10-CM   ?1. Fall, initial encounter  [id number]   ?  ?2. Injury of head, initial encounter  S09.90XA   ?  ?3. Acute pain of right shoulder  M25.511   ?  ?4. Right leg pain  M79.604   ?  ?  ?ED Discharge Orders   ? ? None  ? ?  ?  ? ? ?Discharge Instructions Discussed with and Provided to Patient:  ? ?Discharge Instructions   ?None ?  ? ?  ?Montine Circle, PA-C ?09/11/21 0145 ? ?  Veatrice Kells, MD ?09/11/21 0243 ? ?

## 2021-09-23 ENCOUNTER — Ambulatory Visit: Payer: Self-pay | Admitting: Orthopedic Surgery

## 2021-09-23 ENCOUNTER — Ambulatory Visit: Payer: Medicaid Other

## 2021-09-23 ENCOUNTER — Other Ambulatory Visit: Payer: Self-pay

## 2021-09-23 ENCOUNTER — Ambulatory Visit (INDEPENDENT_AMBULATORY_CARE_PROVIDER_SITE_OTHER): Payer: Medicaid Other

## 2021-09-23 DIAGNOSIS — Z23 Encounter for immunization: Secondary | ICD-10-CM | POA: Diagnosis not present

## 2021-09-23 DIAGNOSIS — B20 Human immunodeficiency virus [HIV] disease: Secondary | ICD-10-CM

## 2021-09-23 NOTE — H&P (Signed)
? ? ? ?Chief Complaint: ?Back and radicular left leg pain ? ?History: ?Tracy [name] presents today with a primary complaint of progressive low back buttock and radicular left leg pain. Patient states their pain level is 6-8/10, and progressively impairing activities of daily living as well as overall quality of life.  Plan on TLIF L4/5  ? ? ?Past Medical History:  ?Diagnosis Date  ? Arthritis   ? Chronic back pain   ? Chronic headaches   ? Depression   ? HIV infection ([place])   ? Joint pain   ? Localized swelling of both lower legs   ? Legs, feet and hands  ? Schizoaffective disorder ([place])   ? Seasonal allergies   ? Stroke (cerebrum) ([place])   ? patient denies, states it was a heat stroke  ? Weight loss   ? ? ?Allergies  ?Allergen Reactions  ? Flexeril [Cyclobenzaprine] Rash  ? Moxifloxacin Rash  ? Tetracyclines & Related Itching  ? Tramadol Itching  ? ? ?Current Outpatient Medications on File Prior to Visit  ?Medication Sig Dispense Refill  ? buPROPion (WELLBUTRIN XL) 150 MG 24 hr tablet Take 150 mg by mouth every morning.    ? Calcium 600-5 MG-MCG TABS Take by mouth.    ? dolutegravir (TIVICAY) 50 MG tablet Take 1 tablet (50 mg total) by mouth daily. 90 tablet 1  ? doxepin (SINEQUAN) 25 MG capsule Take 25 mg by mouth.    ? emtricitabine-rilpivir-tenofovir DF (COMPLERA) 200-25-300 MG tablet Take 1 tablet by mouth daily. 90 tablet 1  ? gabapentin (NEURONTIN) 800 MG tablet Take 1 tablet (800 mg total) by mouth 3 (three) times daily. 90 tablet 5  ? QUEtiapine (SEROQUEL XR) 300 MG 24 hr tablet     ? rivaroxaban (XARELTO) 20 MG TABS tablet Xarelto 20 mg tablet ? TAKE 1 TABLET BY MOUTH WITH SUPPER    ? ?No current facility-administered medications on file prior to visit.  ? ? ?Physical Exam: ?Tracy [name] is a pleasant individual, who appears younger than their stated age.  ? ?Tracy [name] is alert and orientated ?3.  ? ?No shortness of breath, chest pain.  ? ?Lungs: Clear to auscultation ? ?Cardiac: Regular rate and rhythm no rubs gallops  murmurs ? ?Abdomen is soft and non-tender, negative loss of bowel and bladder control, no rebound tenderness.  ? ?Negative: skin lesions abrasions contusions ? ?Peripheral pulses: 2+ dorsalis pedis/posterior tibialis pulses bilaterally. Compartment soft and nontender. ? ?Gait pattern: normal ? ?Assistive devices: None ? ?Neuro: 5/5 motor strength in the lower extremity bilaterally. Positive numbness and dysesthesias into the left lower extremity primarily in the L4 and L5 dermatome. Positive straight leg raise test on the left side. Negative Babinski test, no clonus. 1+ deep tendon reflexes at the knee absent at the Achilles. ? ?Musculoskeletal: Significant low back pain with palpation and range of motion. No SI joint pain. Pain radiates into the paraspinal region causing stiffness and loss of motion. ? ? ? ?Image: ?Lumbar x-rays taken today in the office (AP/lateral/spot lateral) were reviewed: Demonstrate regression of an L4-5 spondylolisthesis with degenerative disc disease. Solid L5-S1 ALIF fusion with no hardware complications. No significant SI joint deterioration. ? ?Lumbar MRI: completed on 08/25/2021 : Severe facet hypertrophy at L4-5 with a grade 1 spondylolisthesis. No significant foraminal stenosis or central canal stenosis. No fracture or abnormal marrow signal change. Solid L5-S1 fusion with no canal or foraminal stenosis. ? ?DG Shoulder Right ? ?Result Date: 09/10/2021 ?CLINICAL DATA:  Pain, fall EXAM: RIGHT SHOULDER -  2+ VIEW COMPARISON:  None. FINDINGS: There is no evidence of fracture or dislocation. There is no evidence of arthropathy or other focal bone abnormality. Soft tissues are unremarkable. IMPRESSION: Negative. Electronically Signed   By: Charlett Nose M.D.   On: 09/10/2021 19:50  ? ?DG Tibia/Fibula Right ? ?Result Date: 09/10/2021 ?CLINICAL DATA:  Fall, pain EXAM: RIGHT TIBIA AND FIBULA - 2 VIEW COMPARISON:  None. FINDINGS: There is no evidence of fracture or other focal bone lesions. Soft  tissues are unremarkable. IMPRESSION: Negative. Electronically Signed   By: Charlett Nose M.D.   On: 09/10/2021 19:50  ? ?CT Head Wo Contrast ? ?Result Date: 09/10/2021 ?CLINICAL DATA:  Head and neck trauma, fall. EXAM: CT HEAD WITHOUT CONTRAST CT CERVICAL SPINE WITHOUT CONTRAST TECHNIQUE: Multidetector CT imaging of the head and cervical spine was performed following the standard protocol without intravenous contrast. Multiplanar CT image reconstructions of the cervical spine were also generated. RADIATION DOSE REDUCTION: This exam was performed according to the departmental dose-optimization program which includes automated exposure control, adjustment of the mA and/or kV according to patient size and/or use of iterative reconstruction technique. COMPARISON:  12/29/2016. FINDINGS: CT HEAD FINDINGS Brain: No acute intracranial hemorrhage, midline shift or mass effect. No extra-axial fluid collection. Gray-white matter differentiation is within normal limits. No hydrocephalus. Vascular: No hyperdense vessel or unexpected calcification. Skull: No acute fracture. Sinuses/Orbits: There is bony deformity of the medial orbital wall on the left which is unchanged from the prior exam and may be related to old trauma. The visualized paranasal sinuses are clear. No acute orbital abnormality. Other: None. CT CERVICAL SPINE FINDINGS Alignment: Normal.  There is loss of normal cervical lordosis. Skull base and vertebrae: No acute fracture. No primary bone lesion or focal pathologic process. Soft tissues and spinal canal: No prevertebral fluid or swelling. No visible canal hematoma. Disc levels: Intervertebral disc space narrowing, endplate osteophyte formation, vacuum disc phenomena and mild facet arthropathy is noted resulting in mild spinal canal and mild-to-moderate neural foraminal stenosis. Upper chest: Negative. Other: None. IMPRESSION: 1. No acute intracranial hemorrhage. 2. Degenerative changes in the cervical spine  without evidence of acute fracture. Electronically Signed   By: Thornell Sartorius M.D.   On: 09/10/2021 21:42  ? ?CT Cervical Spine Wo Contrast ? ?Result Date: 09/10/2021 ?CLINICAL DATA:  Head and neck trauma, fall. EXAM: CT HEAD WITHOUT CONTRAST CT CERVICAL SPINE WITHOUT CONTRAST TECHNIQUE: Multidetector CT imaging of the head and cervical spine was performed following the standard protocol without intravenous contrast. Multiplanar CT image reconstructions of the cervical spine were also generated. RADIATION DOSE REDUCTION: This exam was performed according to the departmental dose-optimization program which includes automated exposure control, adjustment of the mA and/or kV according to patient size and/or use of iterative reconstruction technique. COMPARISON:  12/29/2016. FINDINGS: CT HEAD FINDINGS Brain: No acute intracranial hemorrhage, midline shift or mass effect. No extra-axial fluid collection. Gray-white matter differentiation is within normal limits. No hydrocephalus. Vascular: No hyperdense vessel or unexpected calcification. Skull: No acute fracture. Sinuses/Orbits: There is bony deformity of the medial orbital wall on the left which is unchanged from the prior exam and may be related to old trauma. The visualized paranasal sinuses are clear. No acute orbital abnormality. Other: None. CT CERVICAL SPINE FINDINGS Alignment: Normal.  There is loss of normal cervical lordosis. Skull base and vertebrae: No acute fracture. No primary bone lesion or focal pathologic process. Soft tissues and spinal canal: No prevertebral fluid or swelling. No visible canal hematoma.  Disc levels: Intervertebral disc space narrowing, endplate osteophyte formation, vacuum disc phenomena and mild facet arthropathy is noted resulting in mild spinal canal and mild-to-moderate neural foraminal stenosis. Upper chest: Negative. Other: None. IMPRESSION: 1. No acute intracranial hemorrhage. 2. Degenerative changes in the cervical spine without  evidence of acute fracture. Electronically Signed   By: Brett Fairy M.D.   On: 09/10/2021 21:42   ? ?A/P: ?Keyleen presents today with a primary complaint of progressive low back buttock and radicular left

## 2021-09-23 NOTE — H&P (Deleted)
  The note originally documented on this encounter has been moved the the encounter in which it belongs.  

## 2021-09-26 NOTE — Pre-Procedure Instructions (Signed)
Surgical Instructions ? ? ? Your procedure is scheduled on Thursday, October 02, 2021 at 12:00 PM. ? Report to [facility] Main Entrance "A" at 10:00 A.M., then check in with the Admitting office. ? Call this number if you have problems the morning of surgery: ? [phone] ? ? If you have any questions prior to your surgery date call [phone]: Open Monday-Friday 8am-4pm ? ? ? Remember: ? Do not eat after midnight the night before your surgery ? ?You may drink clear liquids until 9:00 AM the morning of your surgery.   ?Clear liquids allowed are: Water, Non-Citrus Juices (without pulp), Carbonated Beverages, Clear Tea, Black Coffee Only (NO MILK, CREAM OR POWDERED CREAMER of any kind), and Gatorade. ?  ? Take these medicines the morning of surgery with A SIP OF WATER: ? ?buPROPion (WELLBUTRIN XL)  ?dolutegravir (TIVICAY) ?emtricitabine-rilpivir-tenofovir DF (COMPLERA) ?fexofenadine (ALLEGRA) ?gabapentin (NEURONTIN) ? ?Follow your surgeon's instructions on when to stop rivaroxaban (XARELTO).  If no instructions were given by your surgeon then you will need to call the office to get those instructions.   ? ?As of today, STOP taking any Aspirin (unless otherwise instructed by your surgeon) Aleve, Naproxen, Ibuprofen, Motrin, Advil, Goody's, BC's, all herbal medications, fish oil, and all vitamins. ?         ?           ?Do NOT Smoke (Tobacco/Vaping) for 24 hours prior to your procedure. ? ?If you use a CPAP at night, you may bring your mask/headgear for your overnight stay. ?  ?Contacts, glasses, piercing's, hearing aid's, dentures or partials may not be worn into surgery, please bring cases for these belongings.  ?  ?For patients admitted to the hospital, discharge time will be determined by your treatment team. ?  ?Patients discharged the day of surgery will not be allowed to drive home, and someone needs to stay with them for 24 hours. ? ?NO VISITORS WILL BE ALLOWED IN PRE-OP WHERE PATIENTS ARE PREPPED FOR SURGERY.   ONLY 1 SUPPORT PERSON MAY BE PRESENT IN THE WAITING ROOM WHILE YOU ARE IN SURGERY.  IF YOU ARE TO BE ADMITTED, ONCE YOU ARE IN YOUR ROOM YOU WILL BE ALLOWED TWO (2) VISITORS. (1) VISITOR MAY STAY OVERNIGHT BUT MUST ARRIVE TO THE ROOM BY 8pm.  Minor children may have two parents present. Special consideration for safety and communication needs will be reviewed on a case by case basis. ? ? ?Special instructions:   ?[facility]- Preparing For Surgery ? ?Before surgery, you can play an important role. Because skin is not sterile, your skin needs to be as free of germs as possible. You can reduce the number of germs on your skin by washing with CHG (chlorahexidine gluconate) Soap before surgery.  CHG is an antiseptic cleaner which kills germs and bonds with the skin to continue killing germs even after washing.   ? ?Oral Hygiene is also important to reduce your risk of infection.  Remember - BRUSH YOUR TEETH THE MORNING OF SURGERY WITH YOUR REGULAR TOOTHPASTE ? ?Please do not use if you have an allergy to CHG or antibacterial soaps. If your skin becomes reddened/irritated stop using the CHG.  ?Do not shave (including legs and underarms) for at least 48 hours prior to first CHG shower. It is OK to shave your face. ? ?Please follow these instructions carefully. ?  ?Shower the NIGHT BEFORE SURGERY and the MORNING OF SURGERY ? ?If you chose to wash your hair, wash your hair  first as usual with your normal shampoo. ? ?After you shampoo, rinse your hair and body thoroughly to remove the shampoo. ? ?Use CHG Soap as you would any other liquid soap. You can apply CHG directly to the skin and wash gently with a scrungie or a clean washcloth.  ? ?Apply the CHG Soap to your body ONLY FROM THE NECK DOWN.  Do not use on open wounds or open sores. Avoid contact with your eyes, ears, mouth and genitals (private parts). Wash Face and genitals (private parts)  with your normal soap.  ? ?Wash thoroughly, paying special attention to the  area where your surgery will be performed. ? ?Thoroughly rinse your body with warm water from the neck down. ? ?DO NOT shower/wash with your normal soap after using and rinsing off the CHG Soap. ? ?Pat yourself dry with a CLEAN TOWEL. ? ?Wear CLEAN PAJAMAS to bed the night before surgery ? ?Place CLEAN SHEETS on your bed the night before your surgery ? ?DO NOT SLEEP WITH PETS. ? ? ?Day of Surgery: ?Take a shower with CHG soap. ?Do not wear jewelry or makeup ?Do not wear lotions, powders, perfumes, or deodorant. ?Do not shave 48 hours prior to surgery. ?Do not bring valuables to the hospital.  ?[facility] is not responsible for any belongings or valuables. ?Do not wear nail polish, gel polish, artificial nails, or any other type of covering on natural nails (fingers and toes) ?If you have artificial nails or gel coating that need to be removed by a nail salon, please have this removed prior to surgery. Artificial nails or gel coating may interfere with anesthesia's ability to adequately monitor your vital signs. ?Wear Clean/Comfortable clothing the morning of surgery ?Do not apply any deodorants/lotions.   ?Remember to brush your teeth WITH YOUR REGULAR TOOTHPASTE. ?  ?Please read over the following fact sheets that you were given. ?

## 2021-09-29 ENCOUNTER — Encounter (HOSPITAL_COMMUNITY): Payer: Self-pay

## 2021-09-29 ENCOUNTER — Encounter (HOSPITAL_COMMUNITY)
Admission: RE | Admit: 2021-09-29 | Discharge: 2021-09-29 | Disposition: A | Discharge status: Home / Self Care | Payer: Medicaid Other | Source: Ambulatory Visit | Attending: Orthopedic Surgery | Admitting: Orthopedic Surgery

## 2021-09-29 ENCOUNTER — Other Ambulatory Visit: Payer: Self-pay

## 2021-09-29 DIAGNOSIS — Z01812 Encounter for preprocedural laboratory examination: Secondary | ICD-10-CM | POA: Insufficient documentation

## 2021-09-29 DIAGNOSIS — Z01818 Encounter for other preprocedural examination: Secondary | ICD-10-CM

## 2021-09-29 HISTORY — DX: Other pulmonary embolism without acute cor pulmonale: I26.99

## 2021-09-29 HISTORY — DX: Prediabetes: R73.03

## 2021-09-29 LAB — CBC
HCT: 42.7 % (ref 36.0–46.0)
Hemoglobin: 14.1 g/dL (ref 12.0–15.0)
MCH: 31.4 pg (ref 26.0–34.0)
MCHC: 33 g/dL (ref 30.0–36.0)
MCV: 95.1 fL (ref 80.0–100.0)
Platelets: 291 10*3/uL (ref 150–400)
RBC: 4.49 MIL/uL (ref 3.87–5.11)
RDW: 12.8 % (ref 11.5–15.5)
WBC: 5.4 10*3/uL (ref 4.0–10.5)
nRBC: 0 % (ref 0.0–0.2)

## 2021-09-29 LAB — BASIC METABOLIC PANEL
Anion gap: 8 (ref 5–15)
BUN: 12 mg/dL (ref 6–20)
CO2: 24 mmol/L (ref 22–32)
Calcium: 9.9 mg/dL (ref 8.9–10.3)
Chloride: 111 mmol/L (ref 98–111)
Creatinine, Ser: 1.23 mg/dL — ABNORMAL HIGH (ref 0.44–1.00)
GFR, Estimated: 52 mL/min — ABNORMAL LOW (ref >60)
Glucose, Bld: 89 mg/dL (ref 70–99)
Potassium: 4.2 mmol/L (ref 3.5–5.1)
Sodium: 143 mmol/L (ref 135–145)

## 2021-09-29 LAB — SURGICAL PCR SCREEN
MRSA, PCR: NEGATIVE
Staphylococcus aureus: NEGATIVE

## 2021-09-29 LAB — URINALYSIS, ROUTINE W REFLEX MICROSCOPIC
Bilirubin Urine: NEGATIVE
Glucose, UA: NEGATIVE mg/dL
Hgb urine dipstick: NEGATIVE
Ketones, ur: NEGATIVE mg/dL
Leukocytes,Ua: NEGATIVE
Nitrite: NEGATIVE
Protein, ur: NEGATIVE mg/dL
Specific Gravity, Urine: 1.016 (ref 1.005–1.030)
pH: 7 (ref 5.0–8.0)

## 2021-09-29 LAB — TYPE AND SCREEN
ABO/RH(D): O POS
Antibody Screen: NEGATIVE

## 2021-09-29 NOTE — Progress Notes (Addendum)
PCP - Audree Bane FNP ?Cardiologist - denies ? ?PPM/ICD - denies ?Device Orders -  ?Rep Notified -  ? ?Chest x-ray - na ?EKG - na ?Stress Test - none ?ECHO - none ?Cardiac Cath - none ? ?Sleep Study -no  ?CPAP -  ? ?Fasting Blood Sugar - pre diabetic per pt report; not on meds. She says her FBS are usually between 100-120. ?Checks Blood Sugar  three times a day ? ?Blood Thinner Instructions:Pt states her last dose of Xarelto will be 09/29/21 per MD instructions.  ?Aspirin Instructions:na ? ?ERAS Protcol -clear liquids until 0900 ?PRE-SURGERY Ensure or G2- no ? ?COVID TEST- na ? ? ?Anesthesia review:yes- MD request  ? ?Patient denies shortness of breath, fever, cough and chest pain at PAT appointment ? ? ?All instructions explained to the patient, with a verbal understanding of the material. Patient agrees to go over the instructions while at home for a better understanding. Patient also instructed to wear a mask when out in public prior to surgery. The opportunity to ask questions was provided. ?  ?

## 2021-09-30 ENCOUNTER — Encounter (HOSPITAL_COMMUNITY): Payer: Self-pay

## 2021-10-02 ENCOUNTER — Encounter (HOSPITAL_COMMUNITY)
Admission: RE | Disposition: A | Discharge status: Home / Self Care | Payer: Self-pay | Source: Home / Self Care | Attending: Orthopedic Surgery

## 2021-10-02 ENCOUNTER — Inpatient Hospital Stay (HOSPITAL_COMMUNITY): Payer: Medicaid Other | Admitting: Physician Assistant

## 2021-10-02 ENCOUNTER — Other Ambulatory Visit: Payer: Self-pay

## 2021-10-02 ENCOUNTER — Inpatient Hospital Stay (HOSPITAL_COMMUNITY): Payer: Medicaid Other | Admitting: Anesthesiology

## 2021-10-02 ENCOUNTER — Inpatient Hospital Stay (HOSPITAL_COMMUNITY)
Admission: RE | Admit: 2021-10-02 | Discharge: 2021-10-04 | DRG: 454 | Disposition: A | Discharge status: Home / Self Care | Payer: Medicaid Other | Attending: Orthopedic Surgery | Admitting: Orthopedic Surgery

## 2021-10-02 ENCOUNTER — Encounter (HOSPITAL_COMMUNITY): Payer: Self-pay | Admitting: Orthopedic Surgery

## 2021-10-02 ENCOUNTER — Inpatient Hospital Stay (HOSPITAL_COMMUNITY): Payer: Medicaid Other

## 2021-10-02 DIAGNOSIS — E669 Obesity, unspecified: Secondary | ICD-10-CM | POA: Diagnosis present

## 2021-10-02 DIAGNOSIS — M1990 Unspecified osteoarthritis, unspecified site: Secondary | ICD-10-CM

## 2021-10-02 DIAGNOSIS — N289 Disorder of kidney and ureter, unspecified: Secondary | ICD-10-CM

## 2021-10-02 DIAGNOSIS — J302 Other seasonal allergic rhinitis: Secondary | ICD-10-CM | POA: Diagnosis present

## 2021-10-02 DIAGNOSIS — Z981 Arthrodesis status: Secondary | ICD-10-CM

## 2021-10-02 DIAGNOSIS — Z881 Allergy status to other antibiotic agents status: Secondary | ICD-10-CM | POA: Diagnosis not present

## 2021-10-02 DIAGNOSIS — Z888 Allergy status to other drugs, medicaments and biological substances status: Secondary | ICD-10-CM | POA: Diagnosis not present

## 2021-10-02 DIAGNOSIS — M79662 Pain in left lower leg: Secondary | ICD-10-CM | POA: Diagnosis not present

## 2021-10-02 DIAGNOSIS — Z86718 Personal history of other venous thrombosis and embolism: Secondary | ICD-10-CM | POA: Diagnosis not present

## 2021-10-02 DIAGNOSIS — Z6834 Body mass index (BMI) 34.0-34.9, adult: Secondary | ICD-10-CM

## 2021-10-02 DIAGNOSIS — M5416 Radiculopathy, lumbar region: Secondary | ICD-10-CM | POA: Diagnosis present

## 2021-10-02 DIAGNOSIS — M4316 Spondylolisthesis, lumbar region: Secondary | ICD-10-CM | POA: Diagnosis present

## 2021-10-02 DIAGNOSIS — F32A Depression, unspecified: Secondary | ICD-10-CM | POA: Diagnosis present

## 2021-10-02 DIAGNOSIS — M48061 Spinal stenosis, lumbar region without neurogenic claudication: Secondary | ICD-10-CM | POA: Diagnosis present

## 2021-10-02 DIAGNOSIS — Z87891 Personal history of nicotine dependence: Secondary | ICD-10-CM

## 2021-10-02 DIAGNOSIS — Z7901 Long term (current) use of anticoagulants: Secondary | ICD-10-CM | POA: Diagnosis not present

## 2021-10-02 DIAGNOSIS — F259 Schizoaffective disorder, unspecified: Secondary | ICD-10-CM | POA: Diagnosis present

## 2021-10-02 DIAGNOSIS — Z86711 Personal history of pulmonary embolism: Secondary | ICD-10-CM

## 2021-10-02 DIAGNOSIS — B20 Human immunodeficiency virus [HIV] disease: Secondary | ICD-10-CM | POA: Diagnosis present

## 2021-10-02 DIAGNOSIS — Z79899 Other long term (current) drug therapy: Secondary | ICD-10-CM

## 2021-10-02 HISTORY — PX: TRANSFORAMINAL LUMBAR INTERBODY FUSION (TLIF) WITH PEDICLE SCREW FIXATION 1 LEVEL: SHX6141

## 2021-10-02 SURGERY — TRANSFORAMINAL LUMBAR INTERBODY FUSION (TLIF) WITH PEDICLE SCREW FIXATION 1 LEVEL
Anesthesia: General | Site: Spine Lumbar

## 2021-10-02 MED ORDER — SCOPOLAMINE 1 MG/3DAYS TD PT72
1.0000 | MEDICATED_PATCH | TRANSDERMAL | Status: DC
  Administered 2021-10-02: 1.5 mg via TRANSDERMAL
  Filled 2021-10-02: qty 1

## 2021-10-02 MED ORDER — LACTATED RINGERS IV SOLN: INTRAVENOUS | Status: DC

## 2021-10-02 MED ORDER — OXYCODONE HCL 5 MG PO TABS: 5.0000 mg | ORAL_TABLET | ORAL | Status: DC | PRN

## 2021-10-02 MED ORDER — LIDOCAINE 2% (20 MG/ML) 5 ML SYRINGE
INTRAMUSCULAR | Status: AC
  Filled 2021-10-02: qty 5

## 2021-10-02 MED ORDER — ORAL CARE MOUTH RINSE: 15.0000 mL | Freq: Once | OROMUCOSAL | Status: AC

## 2021-10-02 MED ORDER — SODIUM CHLORIDE 0.9 % IV SOLN: 250.0000 mL | INTRAVENOUS | Status: DC

## 2021-10-02 MED ORDER — FLEET ENEMA 7-19 GM/118ML RE ENEM: 1.0000 | ENEMA | Freq: Once | RECTAL | Status: DC | PRN

## 2021-10-02 MED ORDER — TRANEXAMIC ACID-NACL 1000-0.7 MG/100ML-% IV SOLN
INTRAVENOUS | Status: DC | PRN
  Administered 2021-10-02: 1000 mg via INTRAVENOUS

## 2021-10-02 MED ORDER — ACETAMINOPHEN 650 MG RE SUPP: 650.0000 mg | RECTAL | Status: DC | PRN

## 2021-10-02 MED ORDER — DOXEPIN HCL 25 MG PO CAPS
25.0000 mg | ORAL_CAPSULE | Freq: Every day | ORAL | Status: DC
  Administered 2021-10-02 – 2021-10-03 (×2): 25 mg via ORAL
  Filled 2021-10-02 (×2): qty 1

## 2021-10-02 MED ORDER — PHENYLEPHRINE HCL-NACL 20-0.9 MG/250ML-% IV SOLN
INTRAVENOUS | Status: DC | PRN
  Administered 2021-10-02: 40 ug/min via INTRAVENOUS

## 2021-10-02 MED ORDER — CEFAZOLIN SODIUM-DEXTROSE 1-4 GM/50ML-% IV SOLN
1.0000 g | Freq: Three times a day (TID) | INTRAVENOUS | Status: AC
  Administered 2021-10-02 – 2021-10-03 (×2): 1 g via INTRAVENOUS
  Filled 2021-10-02 (×2): qty 50

## 2021-10-02 MED ORDER — HYDROMORPHONE HCL 1 MG/ML IJ SOLN
0.2500 mg | INTRAMUSCULAR | Status: DC | PRN
  Administered 2021-10-02 (×3): 0.5 mg via INTRAVENOUS

## 2021-10-02 MED ORDER — ONDANSETRON HCL 4 MG PO TABS: 4.0000 mg | ORAL_TABLET | Freq: Three times a day (TID) | ORAL | 0 refills | Status: DC | PRN

## 2021-10-02 MED ORDER — METHOCARBAMOL 500 MG PO TABS
500.0000 mg | ORAL_TABLET | Freq: Four times a day (QID) | ORAL | Status: DC | PRN
  Administered 2021-10-02 – 2021-10-03 (×2): 500 mg via ORAL
  Filled 2021-10-02 (×2): qty 1

## 2021-10-02 MED ORDER — SODIUM CHLORIDE 0.9% FLUSH: 3.0000 mL | INTRAVENOUS | Status: DC | PRN

## 2021-10-02 MED ORDER — QUETIAPINE FUMARATE ER 300 MG PO TB24
300.0000 mg | ORAL_TABLET | Freq: Every day | ORAL | Status: DC
  Administered 2021-10-02 – 2021-10-03 (×2): 300 mg via ORAL
  Filled 2021-10-02 (×2): qty 1

## 2021-10-02 MED ORDER — DROPERIDOL 2.5 MG/ML IJ SOLN: 0.6250 mg | Freq: Once | INTRAMUSCULAR | Status: DC | PRN

## 2021-10-02 MED ORDER — OXYCODONE HCL 5 MG/5ML PO SOLN: 5.0000 mg | Freq: Once | ORAL | Status: DC | PRN

## 2021-10-02 MED ORDER — 0.9 % SODIUM CHLORIDE (POUR BTL) OPTIME
TOPICAL | Status: DC | PRN
  Administered 2021-10-02 (×2): 1000 mL

## 2021-10-02 MED ORDER — OXYCODONE-ACETAMINOPHEN 10-325 MG PO TABS: 1.0000 | ORAL_TABLET | Freq: Four times a day (QID) | ORAL | 0 refills | Status: DC | PRN

## 2021-10-02 MED ORDER — SUCCINYLCHOLINE CHLORIDE 200 MG/10ML IV SOSY
PREFILLED_SYRINGE | INTRAVENOUS | Status: AC
  Filled 2021-10-02: qty 10

## 2021-10-02 MED ORDER — TRANEXAMIC ACID-NACL 1000-0.7 MG/100ML-% IV SOLN
INTRAVENOUS | Status: AC
  Filled 2021-10-02: qty 100

## 2021-10-02 MED ORDER — ORAL CARE MOUTH RINSE: 15.0000 mL | Freq: Once | OROMUCOSAL | Status: DC

## 2021-10-02 MED ORDER — LACTATED RINGERS IV SOLN: INTRAVENOUS | Status: DC | PRN

## 2021-10-02 MED ORDER — PROPOFOL 10 MG/ML IV BOLUS
INTRAVENOUS | Status: AC
  Filled 2021-10-02: qty 20

## 2021-10-02 MED ORDER — DEXAMETHASONE SODIUM PHOSPHATE 10 MG/ML IJ SOLN
INTRAMUSCULAR | Status: AC
  Filled 2021-10-02: qty 1

## 2021-10-02 MED ORDER — MIDAZOLAM HCL 2 MG/2ML IJ SOLN
INTRAMUSCULAR | Status: AC
  Filled 2021-10-02: qty 2

## 2021-10-02 MED ORDER — MIDAZOLAM HCL 2 MG/2ML IJ SOLN
INTRAMUSCULAR | Status: DC | PRN
  Administered 2021-10-02: 2 mg via INTRAVENOUS

## 2021-10-02 MED ORDER — HYDROMORPHONE HCL 1 MG/ML IJ SOLN
1.0000 mg | INTRAMUSCULAR | Status: DC | PRN
  Administered 2021-10-03 – 2021-10-04 (×2): 1 mg via INTRAVENOUS
  Filled 2021-10-02 (×2): qty 1

## 2021-10-02 MED ORDER — OXYCODONE HCL 5 MG PO TABS
10.0000 mg | ORAL_TABLET | ORAL | Status: DC | PRN
  Administered 2021-10-02 – 2021-10-04 (×11): 10 mg via ORAL
  Filled 2021-10-02 (×12): qty 2

## 2021-10-02 MED ORDER — FENTANYL CITRATE (PF) 250 MCG/5ML IJ SOLN
INTRAMUSCULAR | Status: AC
  Filled 2021-10-02: qty 5

## 2021-10-02 MED ORDER — EMTRICITAB-RILPIVIR-TENOFOV AF 200-25-25 MG PO TABS
1.0000 | ORAL_TABLET | Freq: Every day | ORAL | Status: DC
  Administered 2021-10-03 – 2021-10-04 (×2): 1 via ORAL
  Filled 2021-10-02 (×2): qty 1

## 2021-10-02 MED ORDER — KETAMINE HCL 10 MG/ML IJ SOLN
INTRAMUSCULAR | Status: DC | PRN
  Administered 2021-10-02 (×2): 10 mg via INTRAVENOUS
  Administered 2021-10-02: 30 mg via INTRAVENOUS

## 2021-10-02 MED ORDER — GLYCOPYRROLATE PF 0.2 MG/ML IJ SOSY
PREFILLED_SYRINGE | INTRAMUSCULAR | Status: DC | PRN
  Administered 2021-10-02: .2 mg via INTRAVENOUS

## 2021-10-02 MED ORDER — FENTANYL CITRATE (PF) 100 MCG/2ML IJ SOLN: 50.0000 ug | Freq: Once | INTRAMUSCULAR | Status: AC

## 2021-10-02 MED ORDER — ONDANSETRON HCL 4 MG/2ML IJ SOLN: 4.0000 mg | Freq: Four times a day (QID) | INTRAMUSCULAR | Status: DC | PRN

## 2021-10-02 MED ORDER — GLYCOPYRROLATE PF 0.2 MG/ML IJ SOSY
PREFILLED_SYRINGE | INTRAMUSCULAR | Status: AC
  Filled 2021-10-02: qty 1

## 2021-10-02 MED ORDER — SUCCINYLCHOLINE CHLORIDE 200 MG/10ML IV SOSY
PREFILLED_SYRINGE | INTRAVENOUS | Status: DC | PRN
  Administered 2021-10-02: 160 mg via INTRAVENOUS

## 2021-10-02 MED ORDER — CHLORHEXIDINE GLUCONATE 0.12 % MT SOLN
15.0000 mL | Freq: Once | OROMUCOSAL | Status: DC
  Filled 2021-10-02: qty 15

## 2021-10-02 MED ORDER — EPHEDRINE SULFATE-NACL 50-0.9 MG/10ML-% IV SOSY
PREFILLED_SYRINGE | INTRAVENOUS | Status: DC | PRN
  Administered 2021-10-02: 10 mg via INTRAVENOUS

## 2021-10-02 MED ORDER — ONDANSETRON HCL 4 MG/2ML IJ SOLN
INTRAMUSCULAR | Status: AC
  Filled 2021-10-02: qty 2

## 2021-10-02 MED ORDER — ACETAMINOPHEN 325 MG PO TABS
650.0000 mg | ORAL_TABLET | ORAL | Status: DC | PRN
Start: 2021-10-02 — End: 2021-10-04
  Administered 2021-10-02 – 2021-10-04 (×7): 650 mg via ORAL
  Filled 2021-10-02 (×9): qty 2

## 2021-10-02 MED ORDER — CEFAZOLIN SODIUM-DEXTROSE 2-4 GM/100ML-% IV SOLN
2.0000 g | INTRAVENOUS | Status: AC
  Administered 2021-10-02: 2 g via INTRAVENOUS
  Filled 2021-10-02: qty 100

## 2021-10-02 MED ORDER — ONDANSETRON HCL 4 MG/2ML IJ SOLN
4.0000 mg | Freq: Once | INTRAMUSCULAR | Status: AC | PRN
  Administered 2021-10-02: 4 mg via INTRAVENOUS

## 2021-10-02 MED ORDER — HYDROMORPHONE HCL 1 MG/ML IJ SOLN
INTRAMUSCULAR | Status: AC
  Filled 2021-10-02: qty 0.5

## 2021-10-02 MED ORDER — HYDROMORPHONE HCL 1 MG/ML IJ SOLN
INTRAMUSCULAR | Status: AC
  Filled 2021-10-02: qty 1

## 2021-10-02 MED ORDER — PROPOFOL 10 MG/ML IV BOLUS
INTRAVENOUS | Status: DC | PRN
  Administered 2021-10-02: 150 mg via INTRAVENOUS
  Administered 2021-10-02: 50 mg via INTRAVENOUS

## 2021-10-02 MED ORDER — EPHEDRINE 5 MG/ML INJ
INTRAVENOUS | Status: AC
  Filled 2021-10-02: qty 5

## 2021-10-02 MED ORDER — THROMBIN 20000 UNITS EX SOLR
CUTANEOUS | Status: DC | PRN
  Administered 2021-10-02: 20 mL via TOPICAL

## 2021-10-02 MED ORDER — KETAMINE HCL 50 MG/5ML IJ SOSY
PREFILLED_SYRINGE | INTRAMUSCULAR | Status: AC
  Filled 2021-10-02: qty 5

## 2021-10-02 MED ORDER — PHENOL 1.4 % MT LIQD: 1.0000 | OROMUCOSAL | Status: DC | PRN

## 2021-10-02 MED ORDER — RIVAROXABAN 20 MG PO TABS
20.0000 mg | ORAL_TABLET | Freq: Every day | ORAL | Status: DC
  Administered 2021-10-04: 20 mg via ORAL
  Filled 2021-10-02: qty 1

## 2021-10-02 MED ORDER — FENTANYL CITRATE (PF) 100 MCG/2ML IJ SOLN
INTRAMUSCULAR | Status: AC
  Administered 2021-10-02: 50 ug via INTRAVENOUS
  Filled 2021-10-02: qty 2

## 2021-10-02 MED ORDER — POLYETHYLENE GLYCOL 3350 17 G PO PACK: 17.0000 g | PACK | Freq: Every day | ORAL | Status: DC | PRN

## 2021-10-02 MED ORDER — FENTANYL CITRATE (PF) 250 MCG/5ML IJ SOLN
INTRAMUSCULAR | Status: DC | PRN
  Administered 2021-10-02 (×3): 50 ug via INTRAVENOUS
  Administered 2021-10-02: 100 ug via INTRAVENOUS
  Administered 2021-10-02 (×3): 50 ug via INTRAVENOUS
  Administered 2021-10-02: 100 ug via INTRAVENOUS

## 2021-10-02 MED ORDER — HYDROMORPHONE HCL 1 MG/ML IJ SOLN
INTRAMUSCULAR | Status: AC
  Administered 2021-10-03: 1 mg
  Filled 2021-10-02: qty 1

## 2021-10-02 MED ORDER — BUPIVACAINE-EPINEPHRINE 0.25% -1:200000 IJ SOLN
INTRAMUSCULAR | Status: DC | PRN
  Administered 2021-10-02: 10 mL

## 2021-10-02 MED ORDER — THROMBIN 20000 UNITS EX SOLR
CUTANEOUS | Status: AC
  Filled 2021-10-02: qty 20000

## 2021-10-02 MED ORDER — METHOCARBAMOL 500 MG PO TABS: 500.0000 mg | ORAL_TABLET | Freq: Three times a day (TID) | ORAL | 0 refills | Status: DC | PRN

## 2021-10-02 MED ORDER — OXYCODONE HCL 5 MG PO TABS: 5.0000 mg | ORAL_TABLET | Freq: Once | ORAL | Status: DC | PRN

## 2021-10-02 MED ORDER — FENTANYL CITRATE (PF) 100 MCG/2ML IJ SOLN
50.0000 ug | Freq: Once | INTRAMUSCULAR | Status: AC
  Administered 2021-10-02: 50 ug via INTRAVENOUS

## 2021-10-02 MED ORDER — ONDANSETRON HCL 4 MG PO TABS: 4.0000 mg | ORAL_TABLET | Freq: Four times a day (QID) | ORAL | Status: DC | PRN

## 2021-10-02 MED ORDER — ONDANSETRON HCL 4 MG/2ML IJ SOLN
INTRAMUSCULAR | Status: DC | PRN
  Administered 2021-10-02: 4 mg via INTRAVENOUS

## 2021-10-02 MED ORDER — CHLORHEXIDINE GLUCONATE 0.12 % MT SOLN
15.0000 mL | Freq: Once | OROMUCOSAL | Status: AC
  Administered 2021-10-02: 15 mL via OROMUCOSAL

## 2021-10-02 MED ORDER — BUPROPION HCL ER (XL) 150 MG PO TB24
150.0000 mg | ORAL_TABLET | Freq: Every morning | ORAL | Status: DC
Start: 2021-10-03 — End: 2021-10-04
  Administered 2021-10-03 – 2021-10-04 (×2): 150 mg via ORAL
  Filled 2021-10-02 (×2): qty 1

## 2021-10-02 MED ORDER — HYDROMORPHONE HCL 1 MG/ML IJ SOLN
INTRAMUSCULAR | Status: DC | PRN
  Administered 2021-10-02: .5 mg via INTRAVENOUS

## 2021-10-02 MED ORDER — SODIUM CHLORIDE 0.9% FLUSH
3.0000 mL | Freq: Two times a day (BID) | INTRAVENOUS | Status: DC
  Administered 2021-10-02 – 2021-10-03 (×3): 3 mL via INTRAVENOUS

## 2021-10-02 MED ORDER — LIDOCAINE 2% (20 MG/ML) 5 ML SYRINGE
INTRAMUSCULAR | Status: DC | PRN
  Administered 2021-10-02: 60 mg via INTRAVENOUS

## 2021-10-02 MED ORDER — METHOCARBAMOL 1000 MG/10ML IJ SOLN
500.0000 mg | Freq: Four times a day (QID) | INTRAVENOUS | Status: DC | PRN
  Filled 2021-10-02: qty 5

## 2021-10-02 MED ORDER — DEXAMETHASONE SODIUM PHOSPHATE 10 MG/ML IJ SOLN
INTRAMUSCULAR | Status: DC | PRN
  Administered 2021-10-02: 4 mg via INTRAVENOUS

## 2021-10-02 MED ORDER — GABAPENTIN 300 MG PO CAPS: 300.0000 mg | ORAL_CAPSULE | Freq: Three times a day (TID) | ORAL | 0 refills | Status: DC | PRN

## 2021-10-02 MED ORDER — DOLUTEGRAVIR SODIUM 50 MG PO TABS
50.0000 mg | ORAL_TABLET | Freq: Every day | ORAL | Status: DC
  Administered 2021-10-03 – 2021-10-04 (×2): 50 mg via ORAL
  Filled 2021-10-02 (×2): qty 1

## 2021-10-02 MED ORDER — SURGIFLO WITH THROMBIN (HEMOSTATIC MATRIX KIT) OPTIME
TOPICAL | Status: DC | PRN
  Administered 2021-10-02: 1 via TOPICAL

## 2021-10-02 MED ORDER — MENTHOL 3 MG MT LOZG: 1.0000 | LOZENGE | OROMUCOSAL | Status: DC | PRN

## 2021-10-02 MED ORDER — BUPIVACAINE-EPINEPHRINE (PF) 0.25% -1:200000 IJ SOLN
INTRAMUSCULAR | Status: AC
  Filled 2021-10-02: qty 30

## 2021-10-02 SURGICAL SUPPLY — 68 items
BAG COUNTER SPONGE SURGICOUNT (BAG) ×2 IMPLANT
BLADE CLIPPER SURG (BLADE) IMPLANT
BUR EGG ELITE 4.0 (BURR) IMPLANT
BUR MATCHSTICK NEURO 3.0 LAGG (BURR) ×1 IMPLANT
BUR SABER RD CUTTING 3.0 (BURR) ×1 IMPLANT
CABLE BIPOLOR RESECTION CORD (MISCELLANEOUS) ×1 IMPLANT
CAGE MOD-EX PL 7X9X28 17D (Cage) ×1 IMPLANT
CANISTER SUCT 3000ML PPV (MISCELLANEOUS) ×2 IMPLANT
CAP RELINE MOD TULIP RMM (Cap) ×4 IMPLANT
CLIP PULSE STIMULATION (NEUROSURGERY SUPPLIES) ×1 IMPLANT
CLSR STERI-STRIP ANTIMIC 1/2X4 (GAUZE/BANDAGES/DRESSINGS) ×2 IMPLANT
COVER SURGICAL LIGHT HANDLE (MISCELLANEOUS) ×2 IMPLANT
DRAIN CHANNEL 15F RND FF W/TCR (WOUND CARE) IMPLANT
DRAPE C-ARM 42X72 X-RAY (DRAPES) ×2 IMPLANT
DRAPE C-ARMOR (DRAPES) ×2 IMPLANT
DRAPE POUCH INSTRU U-SHP 10X18 (DRAPES) ×2 IMPLANT
DRAPE SURG 17X23 STRL (DRAPES) ×2 IMPLANT
DRAPE U-SHAPE 47X51 STRL (DRAPES) ×2 IMPLANT
DRSG OPSITE POSTOP 4X6 (GAUZE/BANDAGES/DRESSINGS) ×1 IMPLANT
DRSG OPSITE POSTOP 4X8 (GAUZE/BANDAGES/DRESSINGS) ×1 IMPLANT
DURAPREP 26ML APPLICATOR (WOUND CARE) ×2 IMPLANT
ELECT BLADE 4.0 EZ CLEAN MEGAD (MISCELLANEOUS)
ELECT BLADE 6.5 EXT (BLADE) IMPLANT
ELECT PENCIL ROCKER SW 15FT (MISCELLANEOUS) ×2 IMPLANT
ELECT REM PT RETURN 9FT ADLT (ELECTROSURGICAL) ×2
ELECTRODE BLDE 4.0 EZ CLN MEGD (MISCELLANEOUS) ×1 IMPLANT
ELECTRODE REM PT RTRN 9FT ADLT (ELECTROSURGICAL) ×1 IMPLANT
GEL DBM PROPEL 5ML (Putty) ×1 IMPLANT
GLOVE SURG MICRO LTX SZ8.5 (GLOVE) ×2 IMPLANT
GLOVE SURG UNDER POLY LF SZ8.5 (GLOVE) ×2 IMPLANT
GOWN STRL REUS W/ TWL LRG LVL3 (GOWN DISPOSABLE) ×1 IMPLANT
GOWN STRL REUS W/TWL 2XL LVL3 (GOWN DISPOSABLE) ×4 IMPLANT
GOWN STRL REUS W/TWL LRG LVL3 (GOWN DISPOSABLE) ×1
KIT BASIN OR (CUSTOM PROCEDURE TRAY) ×2 IMPLANT
KIT POSITION SURG JACKSON T1 (MISCELLANEOUS) ×1 IMPLANT
KIT PULSE MIOM SURFACE (NEUROSURGERY SUPPLIES) ×1 IMPLANT
KIT TURNOVER KIT B (KITS) ×2 IMPLANT
MILL MEDIUM DISP (BLADE) ×1 IMPLANT
NDL SPNL 18GX3.5 QUINCKE PK (NEEDLE) ×2 IMPLANT
NEEDLE 22X1 1/2 (OR ONLY) (NEEDLE) ×2 IMPLANT
NEEDLE SPNL 18GX3.5 QUINCKE PK (NEEDLE) ×4 IMPLANT
NS IRRIG 1000ML POUR BTL (IV SOLUTION) ×2 IMPLANT
PACK LAMINECTOMY ORTHO (CUSTOM PROCEDURE TRAY) ×2 IMPLANT
PACK UNIVERSAL I (CUSTOM PROCEDURE TRAY) ×2 IMPLANT
PAD ARMBOARD 7.5X6 YLW CONV (MISCELLANEOUS) ×4 IMPLANT
PATTIES SURGICAL .5 X.5 (GAUZE/BANDAGES/DRESSINGS) IMPLANT
PATTIES SURGICAL .5 X1 (DISPOSABLE) ×2 IMPLANT
POSITIONER HEAD PRONE TRACH (MISCELLANEOUS) ×2 IMPLANT
PROBE PULSE STIMULATION (NEUROSURGERY SUPPLIES) ×1 IMPLANT
ROD RELINE COCR LORD 5X40MM (Rod) ×2 IMPLANT
SCREW LOCK RSS 4.5/5.0MM (Screw) ×4 IMPLANT
SCREW SHANK RELINE MOD 5.5X35 (Screw) ×5 IMPLANT
SPONGE SURGIFOAM ABS GEL 100 (HEMOSTASIS) ×2 IMPLANT
SPONGE T-LAP 18X18 ~~LOC~~+RFID (SPONGE) ×1 IMPLANT
SPONGE T-LAP 4X18 ~~LOC~~+RFID (SPONGE) ×6 IMPLANT
SURGIFLO W/THROMBIN 8M KIT (HEMOSTASIS) ×1 IMPLANT
SUT BONE WAX W31G (SUTURE) ×2 IMPLANT
SUT MNCRL AB 3-0 PS2 27 (SUTURE) ×4 IMPLANT
SUT VIC AB 1 CT1 18XCR BRD 8 (SUTURE) ×1 IMPLANT
SUT VIC AB 1 CT1 8-18 (SUTURE) ×1
SUT VIC AB 2-0 CT1 18 (SUTURE) ×2 IMPLANT
SYR BULB IRRIG 60ML STRL (SYRINGE) ×2 IMPLANT
SYR CONTROL 10ML LL (SYRINGE) ×2 IMPLANT
TOWEL GREEN STERILE (TOWEL DISPOSABLE) ×2 IMPLANT
TOWEL GREEN STERILE FF (TOWEL DISPOSABLE) ×2 IMPLANT
TRAY FOLEY MTR SLVR 16FR STAT (SET/KITS/TRAYS/PACK) ×2 IMPLANT
WATER STERILE IRR 1000ML POUR (IV SOLUTION) ×1 IMPLANT
YANKAUER SUCT BULB TIP NO VENT (SUCTIONS) ×2 IMPLANT

## 2021-10-02 NOTE — Discharge Instructions (Signed)

## 2021-10-02 NOTE — Anesthesia Procedure Notes (Signed)
Procedure Name: Intubation ?Date/Time: 10/02/2021 10:52 AM ?Performed by: Cathren Harsh, CRNA ?Pre-anesthesia Checklist: Patient identified, Emergency Drugs available, Suction available and Patient being monitored ?Patient Re-evaluated:Patient Re-evaluated prior to induction ?Oxygen Delivery Method: Circle System Utilized ?Preoxygenation: Pre-oxygenation with 100% oxygen ?Induction Type: IV induction ?Ventilation: Mask ventilation without difficulty ?Laryngoscope Size: Mac and 3 ?Grade View: Grade I ?Tube type: Oral ?Tube size: 7.0 mm ?Number of attempts: 1 ?Airway Equipment and Method: Stylet and Oral airway ?Placement Confirmation: ETT inserted through vocal cords under direct vision, positive ETCO2 and breath sounds checked- equal and bilateral ?Secured at: 21 cm ?Tube secured with: Tape ?Dental Injury: Teeth and Oropharynx as per pre-operative assessment  ? ? ? ? ?

## 2021-10-02 NOTE — Transfer of Care (Signed)
Immediate Anesthesia Transfer of Care Note ? ?Patient: Jessah Luyster ? ?Procedure(s) Performed: TRANSFORAMINAL LUMBAR INTERBODY FUSION LUMBAR FOUR THROUGH FIVE (Spine Lumbar) ? ?Patient Location: PACU ? ?Anesthesia Type:General ? ?Level of Consciousness: awake, drowsy, patient cooperative and responds to stimulation ? ?Airway & Oxygen Therapy: Patient Spontanous Breathing and Patient connected to nasal cannula oxygen ? ?Post-op Assessment: Report given to RN, Post -op Vital signs reviewed and stable and Patient moving all extremities X 4 ? ?Post vital signs: Reviewed and stable ? ?Last Vitals:  ?Vitals Value Taken Time  ?BP 105/72 10/02/21 1545  ?Temp    ?Pulse 110 10/02/21 1548  ?Resp 15 10/02/21 1548  ?SpO2 97 % 10/02/21 1548  ?Vitals shown include unvalidated device data. ? ?Last Pain:  ?Vitals:  ? 10/02/21 0917  ?TempSrc:   ?PainSc: 10-Worst pain ever  ?   ? ?  ? ?Complications: No notable events documented. ?

## 2021-10-02 NOTE — H&P (Signed)
Addendum H&P: There is been no change in the patient's clinical exam since her last office visit of 09/23/2021.  Patient has neuropathic leg pain and a spondylolisthesis at L4-5 status post previous ALIF L5-S1.  We will plan on moving forward with a TLIF at L4-5.  All appropriate risks, benefits, and alternatives to surgery were discussed with the patient and she expressed willingness to move forward. ?

## 2021-10-02 NOTE — Anesthesia Preprocedure Evaluation (Addendum)
Anesthesia Evaluation  ?Patient identified by MRN, date of birth, ID band ?Patient awake ? ? ? ?Reviewed: ?Allergy & Precautions, NPO status , Patient's Chart, lab work & pertinent test results, reviewed documented beta blocker date and time  ? ?Airway ?Mallampati: II ? ?TM Distance: >3 FB ?Neck ROM: Full ? ? ? Dental ? ?(+) Dental Advisory Given, Missing, Partial Upper,  ?  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  ?Pulmonary exam normal ?breath sounds clear to auscultation ? ? ? ? ? ? Cardiovascular ?negative cardio ROS ?Normal cardiovascular exam ?Rhythm:Regular Rate:Normal ? ? ?  ?Neuro/Psych ? Headaches,  Neuromuscular disease   ? GI/Hepatic ?negative GI ROS, Neg liver ROS,   ?Endo/Other  ?Obesity ? Renal/GU ?Renal InsufficiencyRenal disease  ?negative genitourinary ?  ?Musculoskeletal ? ?(+) Arthritis , Osteoarthritis,  Degenerative spondylolisthesis L4-L5 with left radiculopathy  ? Abdominal ?(+) + obese,   ?Peds ? Hematology ? ?(+) HIV, Undetectable HIV level   ?Anesthesia Other Findings ? ? Reproductive/Obstetrics ? ?  ? ? ? ? ? ? ? ? ? ? ? ? ? ?  ?  ? ? ? ? ? ? ? ?Anesthesia Physical ?Anesthesia Plan ? ?ASA: 3 ? ?Anesthesia Plan: General  ? ?Post-op Pain Management: Tylenol PO (pre-op)*, Precedex and Dilaudid IV  ? ?Induction: Intravenous ? ?PONV Risk Score and Plan: 4 or greater and Treatment may vary due to age or medical condition, Midazolam, Ondansetron, Dexamethasone and Scopolamine patch - Pre-op ? ?Airway Management Planned: Oral ETT ? ?Additional Equipment:  ? ?Intra-op Plan:  ? ?Post-operative Plan: Extubation in OR ? ?Informed Consent: I have reviewed the patients History and Physical, chart, labs and discussed the procedure including the risks, benefits and alternatives for the proposed anesthesia with the patient or authorized representative who has indicated his/her understanding and acceptance.  ? ? ? ?Dental advisory  given ? ?Plan Discussed with: CRNA and Anesthesiologist ? ?Anesthesia Plan Comments:   ? ? ? ? ? ? ?Anesthesia Quick Evaluation ? ?

## 2021-10-02 NOTE — Op Note (Signed)
OPERATIVE REPORT ? ?DATE OF SURGERY: 10/02/2021 ? ?PATIENT NAME:  Tracy [name] ?MRN: [id number] ?DOB: [birth year] ? ?PCP: Lindaann Pascal, PA-C ? ?PRE-OPERATIVE DIAGNOSIS: Degenerative spondylolisthesis L4-5 with radicular left leg pain ? ?POST-OPERATIVE DIAGNOSIS: Same ? ?PROCEDURE:   ?TLIF L4-5 ? ?SURGEON:  Venita Lick, MD ? ?PHYSICIAN ASSISTANT: None ? ?ANESTHESIA:   General ? ?EBL: 450 ml  ? ?Complications: None ? ?Implants: Nu[employer]asive X PL TLIF cage.  7 x 9 x 28 expanded to anterior height of 11 mm, posterior height of 9 mm.  Cortical pedicle screws.  5.5 x 35 mm length.  40 mm length locking rod. ? ?Graft: Autograft from decompression supplemented with DBX. ? ?Neuro monitoring: Right L4 and L5 pedicle screws were directly stimulated and there was adverse activity at 24 mA.  Left L4 pedicle screw: Positive activity greater than 40 mA.  Left L5: Positive activity at 41mA  ? ?BRIEF HISTORY: ?Tracy [name] is a 56 y.o. female who had a previous ALIF at L5-S1 and has done well until recently.  Imaging studies demonstrate that she has developed an adjacent segment spondylolisthesis with foraminal stenosis.  Attempts at conservative management had failed to alleviate her symptoms and so we elected to proceed with surgery.  All appropriate risks, benefits, and alternatives were discussed with the patient and consent was obtained ? ?PROCEDURE DETAILS: ?Patient was brought into the operating room and was properly positioned on the operating room table.  After induction with general anesthesia the patient was endotracheally intubated.  A timeout was taken to confirm all important data: including patient, procedure, and the level. Teds, SCD's were applied.  ? ?She was placed prone onto the [facility] spine frame and the back was prepped and draped in a standard fashion.  Using fluoroscopy identified the L4 and L5 pedicles and marked out my midline incision.  The incision was then infiltrated with quarter percent Marcaine with  epinephrine in the midline incision was made.  Sharp dissection was carried out down to the deep fascia.  Deep fascia was sharply incised and I stripped the paraspinal muscles to expose the L4, L5 spinous process and lamina as well as the L3-4 and L4-5 facet complexes.  Using fluoroscopy I confirmed that I was at the appropriate level.  I then remove the facet capsule at the L4-5 facet complex. ? ?Using a high-speed bur I decorticated the area just at the 7 o'clock position on the AP view of the right L4 and L5 pedicle.  The pedicle probe was placed and then advanced starting at the 7 o'clock position and aiming towards the 1 o'clock position.  I monitored his progress using AP fluoroscopy.  Just as I was nearing the lateral wall of the pedicle I switched to the lateral view and confirmed that I was beyond the posterior wall of the vertebral body.  I then advanced into the vertebral body.  I removed the pedicle probe and sounded canal with a ball-tipped feeler.  Once I confirmed a solid bony canal I then placed the tap, and then repalpated the hole.  The 35 mm cortical screw was then placed.  Using the exact same technique I placed the L5 cortical screw.  With the right screws in place I then placed my high-speed bur on the 5 o'clock position of the left L4 and L5 pedicle and decorticated I then advanced my pedicle probe starting at the 5 o'clock position aiming towards the 11 o'clock position.  Using fluoroscopy to guide my trajectory I successfully placed  the left L4 and L5 cortical screws.  All 4 screws were directly stimulated and there was no adverse activity below 24 mA. ? ?At this point I repositioned my retractors to expose the L4 lamina.  I resected the interspinous process ligament and placed my lamina spreader and began performing a left-sided laminotomy.  Shortly after creating a laminotomy defect I realized that I had been displaced superiorly.  Imaging confirmed that infected a partial L3 laminotomy at  this point I repositioned my retractor so I could better visualize the appropriate level at this point the remaining portion of the L4 spinous process was resected in a more aggressive hemilaminotomy of L4 was created.  Osteotome was used to resect the entire inferior L4 facet and pars.  At this point I then resected the ligamentum flavum to expose the thecal sac and.  I continued into the lateral recess until I could palpate and visualize the medial and superior border of the L5 pedicle.  Was also able to visualize the L4 nerve root and palpate the inferior aspect of the L4 pedicle.  I now had a complete Gill decompression on visualization of the posterior annulus, exiting L4 nerve root, and the traversing L5 nerve root.  Nerve root retractor was used to protect the thecal sac and annulotomy was performed. ? ?Using curettes, endplate shavers, pituitary rongeurs I resected the bulk of the disc material.  I continued using sidecutting curettes to remove disc material all the way over to the contralateral side.  I then placed a trial implant and confirmed that I was able to get past the midline with the implant.  At this point with the expandable cage was obtained and the wound was copiously irrigated with normal saline portion of the bone graft that had obtained from the decompression was then placed along the anterior annulus to the disc space itself to aid in the fusion.  The implant was then [facility] into position.  I confirmed satisfactory position of the intervertebral cage in both the AP and lateral planes.  I then expanded the anterior and posterior portions of the cage in order to attain an excellent fit.  I then backfilled the cage with DBX.  The inserting device was removed and I irrigated the wound copiously with normal saline.  At this point the polyaxial heads were secured to the pedicle screws and the rods were inserted.  On the right-hand side I used a high-speed bur to disrupt the facet complex and  packed the posterior lateral gutter with bone graft. ? ?At this point the wound was copes irrigated normal saline and I confirmed hemostasis using bipolar electrocautery and direct visualization.  Thrombin-soaked Gelfoam patty was placed over the laminotomy defect and the retractors were removed.  The deep fascia was closed with interrupted #1 Vicryl suture, then a layer of 2-0 Vicryl suture, and finally 3-0 Monocryl for the skin.  Steri-Strips and dry dressings were applied and the patient was ultimately extubated transfer the PACU without incident.  The end of the case all needle sponge counts were correct.  There were no adverse intraoperative events. ? ?Venita Lick, MD ?10/02/2021 ?3:44 PM ? ? ?

## 2021-10-02 NOTE — Brief Op Note (Signed)
10/02/2021 ? ?4:13 PM ? ?PATIENT:  Tracy [name]  56 y.o. female ? ?PRE-OPERATIVE DIAGNOSIS:  Degenerative spondylothesis with left radicular leg pain ? ?POST-OPERATIVE DIAGNOSIS:  Degenerative spondylothesis with left radicular leg pain ? ?PROCEDURE:  Procedure(s): ?TRANSFORAMINAL LUMBAR INTERBODY FUSION LUMBAR FOUR THROUGH FIVE (N/A) ? ?SURGEON:  Surgeon(s) and Role: ?   Venita Lick, MD - Primary ? ?PHYSICIAN ASSISTANT:  ? ?ASSISTANTS: none  ? ?ANESTHESIA:   general ? ?EBL:  450 mL  ? ?BLOOD ADMINISTERED:none ? ?DRAINS: none  ? ?LOCAL MEDICATIONS USED:  MARCAINE    ? ?SPECIMEN:  No Specimen ? ?DISPOSITION OF SPECIMEN:  N/A ? ?COUNTS:  YES ? ?TOURNIQUET:  * No tourniquets in log * ? ?DICTATION: .Dragon Dictation ? ?PLAN OF CARE: Admit to inpatient  ? ?PATIENT DISPOSITION:  PACU - hemodynamically stable. ?  ?

## 2021-10-03 MED ORDER — DIAZEPAM 5 MG PO TABS
10.0000 mg | ORAL_TABLET | Freq: Three times a day (TID) | ORAL | Status: DC | PRN
  Administered 2021-10-03 – 2021-10-04 (×4): 10 mg via ORAL
  Filled 2021-10-03 (×4): qty 2

## 2021-10-03 NOTE — Evaluation (Signed)
Occupational Therapy Evaluation ?Patient Details ?Name: Tracy [name] ?MRN: [id number] ?DOB: 12/12/1965 ?Today's Date: 10/03/2021 ? ? ?History of Present Illness 56 y/o female admitted on 10/02/21 following TLIF L4-5. PMH: HIV, schizoaffective disorder  ? ?Clinical Impression ?  ?PTA pt lives independently with her husband. Began education regarding compensatory strategies  to adhere to back precautions for ADL and mobility tasks. Husband will be able to assist at DC. Will follow up prior to DC to review back precautions for ADL and mobility.  ?   ? ?Recommendations for follow up therapy are one component of a multi-disciplinary discharge planning process, led by the attending physician.  Recommendations may be updated based on patient status, additional functional criteria and insurance authorization.  ? ?Follow Up Recommendations ? No OT follow up  ?  ?Assistance Recommended at Discharge Intermittent Supervision/Assistance  ?Patient can return home with the following A little help with bathing/dressing/bathroom;Assistance with cooking/housework;Assist for transportation;Help with stairs or ramp for entrance ? ?  ?Functional Status Assessment ? Patient has had a recent decline in their functional status and demonstrates the ability to make significant improvements in function in a reasonable and predictable amount of time.  ?Equipment Recommendations ? BSC/3in1  ?  ?Recommendations for Other Services   ? ? ?  ?Precautions / Restrictions Precautions ?Precautions: Back ?Precaution Booklet Issued: Yes (comment) ?Required Braces or Orthoses: Spinal Brace ?Spinal Brace: Lumbar corset ?Restrictions ?Weight Bearing Restrictions: No  ? ?  ? ?Mobility Bed Mobility ?Overal bed mobility: Needs Assistance ?Bed Mobility: Sidelying to Sit ?  ?Sidelying to sit: Supervision ?  ?  ?  ?General bed mobility comments: vc on log rolling ?  ? ?Transfers ?Overall transfer level: Needs assistance ?Equipment used: Rolling walker (2  wheels) ?Transfers: Sit to/from Stand ?Sit to Stand: Supervision ?  ?  ?  ?  ?  ?General transfer comment: increased time and effort to stand from low recliner ?  ? ?  ?Balance Overall balance assessment: Needs assistance ?Sitting-balance support: No upper extremity supported, Feet supported ?Sitting balance-Leahy Scale: Good ?  ?  ?Standing balance support: Bilateral upper extremity supported, Reliant on assistive device for balance ?Standing balance-Leahy Scale: Poor ?Standing balance comment: reliant on UE support ?  ?  ?  ?  ?  ?  ?  ?  ?  ?  ?  ?   ? ?ADL either performed or assessed with clinical judgement  ? ?ADL Overall ADL's : Needs assistance/impaired ?  ?  ?  ?  ?  ?  ?  ?  ?  ?  ?  ?  ?  ?  ?  ?  ?  ?  ?Functional mobility during ADLs: Supervision/safety;Rolling walker (2 wheels) ?General ADL Comments: Unable to ocmplete figurre four positioning at this time however able to do so prior to surgery. Educated on compensatory strateiges for ADL with available AE if needed. Husband plans to help. Pt has a "very low toilet", which was difficult for her to stand from prior to surgery. Pt will benefit from a BSC to increase safety adn independence with ADL tasks  ? ? ? ?Vision   ?   ?   ?Perception   ?  ?Praxis   ?  ? ?Pertinent Vitals/Pain Pain Assessment ?Pain Assessment: 0-10 ?Pain Score: 8  ?Pain Location: back ?Pain Descriptors / Indicators: Guarding, Grimacing ?Pain Intervention(s): Limited activity within patient's tolerance, Premedicated before session  ? ? ? ?Hand Dominance Right ?  ?Extremity/Trunk Assessment Upper Extremity Assessment ?  Upper Extremity Assessment: Overall WFL for tasks assessed ?  ?Lower Extremity Assessment ?Lower Extremity Assessment: Defer to PT evaluation ?  ?Cervical / Trunk Assessment ?Cervical / Trunk Assessment: Back Surgery ?  ?Communication Communication ?Communication: No difficulties ?  ?Cognition Arousal/Alertness: Awake/alert ?Behavior During Therapy: [facility] for tasks  assessed/performed ?Overall Cognitive Status: Within Functional Limits for tasks assessed ?  ?  ?  ?  ?  ?  ?  ?  ?  ?  ?  ?  ?  ?  ?  ?  ?  ?  ?  ?General Comments    ? ?  ?Exercises   ?  ?Shoulder Instructions    ? ? ?Home Living Family/patient expects to be discharged to:: Private residence ?Living Arrangements: Spouse/significant other ?Available Help at Discharge: Family ?Type of Home: Other(Comment) (Condo) ?Home Access: Stairs to enter ?Entrance Stairs-Number of Steps: 10 ?Entrance Stairs-Rails: Right ?Home Layout: One level ?  ?  ?Bathroom Shower/Tub: Tub/shower unit;Curtain ?  ?Bathroom Toilet: Standard ?Bathroom Accessibility: Yes ?How Accessible: Accessible via walker ?Home Equipment: None ?  ?  ?  ? ?  ?Prior Functioning/Environment Prior Level of Function : Independent/Modified Independent ?  ?  ?  ?  ?  ?  ?  ?  ?  ? ?  ?  ?OT Problem List: Decreased knowledge of use of DME or AE;Decreased knowledge of precautions;Obesity;Pain;Decreased range of motion ?  ?   ?OT Treatment/Interventions: Self-care/ADL training;DME and/or AE instruction;Therapeutic activities;Patient/family education  ?  ?OT Goals(Current goals can be found in the care plan section) Acute Rehab OT Goals ?Patient Stated Goal: to not be in pain ?OT Goal Formulation: With patient ?Time For Goal Achievement: 10/17/21 ?Potential to Achieve Goals: Good  ?OT Frequency: Min 2X/week ?  ? ?Co-evaluation   ?  ?  ?  ?  ? ?  ?AM-PAC OT "6 Clicks" Daily Activity     ?Outcome Measure Help from another person eating meals?: None ?Help from another person taking care of personal grooming?: A Little ?Help from another person toileting, which includes using toliet, bedpan, or urinal?: A Little ?Help from another person bathing (including washing, rinsing, drying)?: A Little ?Help from another person to put on and taking off regular upper body clothing?: A Little ?Help from another person to put on and taking off regular lower body clothing?: A Little ?6  Click Score: 19 ?  ?End of Session Equipment Utilized During Treatment: Rolling walker (2 wheels);Back brace ?Nurse Communication: Other (comment) (DC needs) ? ?Activity Tolerance: Patient tolerated treatment well ?Patient left: in chair;with call bell/phone within reach;with family/visitor present ? ?OT Visit Diagnosis: Unsteadiness on feet (R26.81);Muscle weakness (generalized) (M62.81);Pain ?Pain - part of body:  (back)  ?              ?Time: 0800-0820 ?OT Time Calculation (min): 20 min ?Charges:  OT General Charges ?$OT Visit: 1 Visit ?OT Evaluation ?$OT Eval Low Complexity: 1 Low ? ?[facility], OT/L  ? ?Acute OT Clinical Specialist ?Acute Rehabilitation Services ?Pager [phone] ?Office [phone]  ? ?Mung Rinker,HILLARY ?10/03/2021, 9:52 AM ?

## 2021-10-03 NOTE — Progress Notes (Signed)
? ? ?  Subjective: ?Procedure(s) (LRB): ?TRANSFORAMINAL LUMBAR INTERBODY FUSION LUMBAR FOUR THROUGH FIVE (N/A) 1 Day Post-Op  ?Patient reports pain as 6 on 0-10 scale.  ?Reports Muscle spasms/cramps in the left leg pain ?reports incisional back pain   ?Positive void ?Negative bowel movement ?Positive flatus ?Negative chest pain or shortness of breath ? ?Objective: ?Vital signs in last 24 hours: ?Temp:  [97.8 ?F (36.6 ?C)-99.5 ?F (37.5 ?C)] 99.5 ?F (37.5 ?C) (03/24 0418) ?Pulse Rate:  [70-102] 98 (03/24 0418) ?Resp:  [12-25] 20 (03/24 0418) ?BP: (105-183)/(45-100) 106/45 (03/24 0418) ?SpO2:  [93 %-100 %] 97 % (03/24 0418) ?Weight:  [103 kg] 103 kg (03/23 0903) ? ?Intake/Output from previous day: ?03/23 0701 - 03/24 0700 ?In: 1600 [I.V.:1400; IV Piggyback:200] ?Out: 1350 [Urine:900; Blood:450] ? ?Labs: ?No results for input(s): WBC, RBC, HCT, PLT in the last 72 hours. ?No results for input(s): NA, K, CL, CO2, BUN, CREATININE, GLUCOSE, CALCIUM in the last 72 hours. ?No results for input(s): LABPT, INR in the last 72 hours. ? ?Physical Exam: ?Neurologically intact ?ABD soft ?Neurovascular intact ?Intact pulses distally ?Incision: dressing C/D/I and no drainage ?Compartment soft ?Body mass index is 34.52 kg/m?. ? ? ?Assessment/Plan: ?Patient stable  ?xrays n/a ?Continue mobilization with physical therapy ?Continue care  ?Advance diet ?Up with therapy ?Will change robaxin for valium. ?Possible d/c Saturday. ? ?Melina Schools, MD ?[facility] ?[phone] ? ?

## 2021-10-03 NOTE — Progress Notes (Signed)
Patient continue to c/o of left  leg calf pain that extends to the back of left thigh.Patient described the pain like charley horse like cramp and does not go away even with Valium. Per patient, the left leg pain is worse than her back pain. Assessment done - bothe legs equally warm to touch, no swelling noted, pedal pulses  palpable and strong 2+. Patient has history of PE and stopped taking Xarelto last 11/29/21.  On  call PA notified and new order received: Venous ultrasound to r/o DVT STAT.  Followed up with vascular radiology, procedure will be done tomorrow morning. Will cont to monitor patient. ?

## 2021-10-03 NOTE — Evaluation (Signed)
Physical Therapy Evaluation & Discharge ?Patient Details ?Name: Tracy [name] ?MRN: [id number] ?DOB: March 05, 1966 ?Today's Date: 10/03/2021 ? ?History of Present Illness ? 56 y/o female admitted on 10/02/21 following TLIF L4-5. PMH: HIV, schizoaffective disorder  ?Clinical Impression ? Patient admitted with above procedure. Patient functioning at supervision level for mobility with use of RW. Patient will have husband to provide necessary supervision at discharge. Educated patient on brace wear, reinforced back precautions, and progressive walking program, patient verbalized understanding. No further skilled PT needs identified acutely. No PT follow up recommended at this time.    ?   ? ?Recommendations for follow up therapy are one component of a multi-disciplinary discharge planning process, led by the attending physician.  Recommendations may be updated based on patient status, additional functional criteria and insurance authorization. ? ?Follow Up Recommendations No PT follow up ? ?  ?Assistance Recommended at Discharge Intermittent Supervision/Assistance  ?Patient can return home with the following ?   ? ?  ?Equipment Recommendations Rolling Rual Vermeer (2 wheels);BSC/3in1  ?Recommendations for Other Services ?    ?  ?Functional Status Assessment Patient has had a recent decline in their functional status and demonstrates the ability to make significant improvements in function in a reasonable and predictable amount of time.  ? ?  ?Precautions / Restrictions Precautions ?Precautions: Back ?Precaution Booklet Issued: Yes (comment) ?Required Braces or Orthoses: Spinal Brace ?Spinal Brace: Lumbar corset ?Restrictions ?Weight Bearing Restrictions: No  ? ?  ? ?Mobility ? Bed Mobility ?  ?  ?  ?  ?  ?  ?  ?General bed mobility comments: in recliner on arrival ?  ? ?Transfers ?Overall transfer level: Needs assistance ?Equipment used: Rolling Charnel Giles (2 wheels) ?Transfers: Sit to/from Stand ?Sit to Stand: Supervision ?  ?  ?  ?   ?  ?General transfer comment: increased time and effort to stand from low recliner ?  ? ?Ambulation/Gait ?Ambulation/Gait assistance: Supervision ?Gait Distance (Feet): 300 Feet ?Assistive device: Rolling Allesandra Huebsch (2 wheels) ?Gait Pattern/deviations: Step-through pattern, Decreased stride length ?Gait velocity: decreased ?  ?  ?General Gait Details: various short standing rest breaks due to fatigue but overall supervision with no LOB noted ? ?Stairs ?Stairs: Yes ?Stairs assistance: Supervision ?Stair Management: One rail Right, Step to pattern, Forwards ?Number of Stairs: 10 ?General stair comments: instructed patient on up with good and down with the bad ? ?Wheelchair Mobility ?  ? ?Modified Rankin (Stroke Patients Only) ?  ? ?  ? ?Balance Overall balance assessment: Needs assistance ?Sitting-balance support: No upper extremity supported, Feet supported ?Sitting balance-Leahy Scale: Good ?  ?  ?Standing balance support: Bilateral upper extremity supported, Reliant on assistive device for balance ?Standing balance-Leahy Scale: Poor ?Standing balance comment: reliant on UE support ?  ?  ?  ?  ?  ?  ?  ?  ?  ?  ?  ?   ? ? ? ?Pertinent Vitals/Pain Pain Assessment ?Pain Assessment: 0-10 ?Pain Score: 8  ?Pain Location: back ?Pain Descriptors / Indicators: Guarding, Grimacing ?Pain Intervention(s): Monitored during session  ? ? ?Home Living Family/patient expects to be discharged to:: Private residence ?Living Arrangements: Spouse/significant other ?Available Help at Discharge: Family ?Type of Home: Other(Comment) (Condo) ?Home Access: Stairs to enter ?Entrance Stairs-Rails: Right ?Entrance Stairs-Number of Steps: 10 ?  ?  ?Home Equipment: None ?   ?  ?Prior Function Prior Level of Function : Independent/Modified Independent ?  ?  ?  ?  ?  ?  ?  ?  ?  ? ? ?  Hand Dominance  ?   ? ?  ?Extremity/Trunk Assessment  ? Upper Extremity Assessment ?Upper Extremity Assessment: Defer to OT evaluation ?  ? ?Lower Extremity  Assessment ?Lower Extremity Assessment: Generalized weakness ?  ? ?Cervical / Trunk Assessment ?Cervical / Trunk Assessment: Back Surgery  ?Communication  ? Communication: No difficulties  ?Cognition Arousal/Alertness: Awake/alert ?Behavior During Therapy: [facility] for tasks assessed/performed ?Overall Cognitive Status: Within Functional Limits for tasks assessed ?  ?  ?  ?  ?  ?  ?  ?  ?  ?  ?  ?  ?  ?  ?  ?  ?  ?  ?  ? ?  ?General Comments   ? ?  ?Exercises    ? ?Assessment/Plan  ?  ?PT Assessment Patient does not need any further PT services  ?PT Problem List   ? ?   ?  ?PT Treatment Interventions     ? ?PT Goals (Current goals can be found in the Care Plan section)  ?Acute Rehab PT Goals ?Patient Stated Goal: to go home ?PT Goal Formulation: All assessment and education complete, DC therapy ?Time For Goal Achievement: 10/17/21 ?Potential to Achieve Goals: Good ? ?  ?Frequency   ?  ? ? ?Co-evaluation   ?  ?  ?  ?  ? ? ?  ?AM-PAC PT "6 Clicks" Mobility  ?Outcome Measure Help needed turning from your back to your side while in a flat bed without using bedrails?: None ?Help needed moving from lying on your back to sitting on the side of a flat bed without using bedrails?: A Little ?Help needed moving to and from a bed to a chair (including a wheelchair)?: A Little ?Help needed standing up from a chair using your arms (e.g., wheelchair or bedside chair)?: A Little ?Help needed to walk in hospital room?: A Little ?Help needed climbing 3-5 steps with a railing? : A Little ?6 Click Score: 19 ? ?  ?End of Session Equipment Utilized During Treatment: Back brace ?Activity Tolerance: Patient tolerated treatment well ?Patient left: in chair;with call bell/phone within reach;with family/visitor present ?Nurse Communication: Mobility status ?PT Visit Diagnosis: Muscle weakness (generalized) (M62.81) ?  ? ?Time: 5170-0174 ?PT Time Calculation (min) (ACUTE ONLY): 15 min ? ? ?Charges:   PT Evaluation ?$PT Eval Low Complexity: 1 Low ?   ?  ?   ? ? ?Twila Rappa A. Dan Humphreys, PT, DPT ?Acute Rehabilitation Services ?Pager [phone] ?Office [phone] ? ? ?Aiyanna Awtrey A Tyia Binford ?10/03/2021, 8:58 AM ? ?

## 2021-10-03 NOTE — Anesthesia Postprocedure Evaluation (Signed)
Anesthesia Post Note ? ?Patient: Tracy [name] ? ?Procedure(s) Performed: TRANSFORAMINAL LUMBAR INTERBODY FUSION LUMBAR FOUR THROUGH FIVE (Spine Lumbar) ? ?  ? ?Patient location during evaluation: PACU ?Anesthesia Type: General ?Level of consciousness: awake ?Pain management: pain level controlled ?Vital Signs Assessment: post-procedure vital signs reviewed and stable ?Respiratory status: spontaneous breathing and respiratory function stable ?Cardiovascular status: stable ?Postop Assessment: no apparent nausea or vomiting ?Anesthetic complications: no ? ? ?No notable events documented. ? ?Last Vitals:  ?Vitals:  ? 10/03/21 1212 10/03/21 1559  ?BP: 106/61 126/64  ?Pulse: 94 (!) 105  ?Resp: 16 16  ?Temp: 37.1 ?C (!) 38.2 ?C  ?SpO2: 95% 93%  ?  ?Last Pain:  ?Vitals:  ? 10/03/21 1655  ?TempSrc:   ?PainSc: 8   ? ?Pain Goal: Patients Stated Pain Goal: 3 (10/03/21 1655) ? ?  ?  ?  ?  ?  ?  ?  ? ?Tracy [name] ? ? ? ? ?

## 2021-10-04 ENCOUNTER — Inpatient Hospital Stay (HOSPITAL_COMMUNITY): Payer: Medicaid Other

## 2021-10-04 DIAGNOSIS — M79662 Pain in left lower leg: Secondary | ICD-10-CM

## 2021-10-04 MED ORDER — ONDANSETRON HCL 4 MG PO TABS: 4.0000 mg | ORAL_TABLET | Freq: Three times a day (TID) | ORAL | 0 refills | Status: DC | PRN

## 2021-10-04 MED ORDER — METHOCARBAMOL 500 MG PO TABS: 500.0000 mg | ORAL_TABLET | Freq: Three times a day (TID) | ORAL | 0 refills | Status: AC | PRN

## 2021-10-04 MED ORDER — GABAPENTIN 300 MG PO CAPS: 300.0000 mg | ORAL_CAPSULE | Freq: Three times a day (TID) | ORAL | 0 refills | Status: DC | PRN

## 2021-10-04 MED ORDER — OXYCODONE-ACETAMINOPHEN 10-325 MG PO TABS: 1.0000 | ORAL_TABLET | Freq: Four times a day (QID) | ORAL | 0 refills | Status: AC | PRN

## 2021-10-04 NOTE — Plan of Care (Signed)

## 2021-10-04 NOTE — Progress Notes (Signed)
LLE venous duplex has been completed.  Preliminary results given to Benson Norway and Ulice Dash, Therapist, sports. ? ? ?Results can be found under chart review under CV PROC. ?10/04/2021 9:25 AM ?Latifah Padin RVT, RDMS ? ?

## 2021-10-04 NOTE — Progress Notes (Signed)
Patient awaiting for transport via wheelchair by RN for discharge to home; with complaints of moderate pain on her left calf and thigh more than her back incision and was medicated before leaving the unit; US Doppler on her left leg done this am and was negative for DVT; incision on her back with gauze dressing and is clean, dry and intact; room was checked for all belongings and was carried along by husband and staff on the way to her vehicle; discharge instructions concerning her medications, incision care, follow up appointment and when to call the doctor as needed were all discussed with patient and husband by RN and both expressed understanding on the instructions given. ?

## 2021-10-04 NOTE — Discharge Summary (Addendum)
Patient ID: ?Tracy [name] ?MRN: [id number] ?DOB/AGE: January 18, 1966 56 y.o. ? ?Admit date: 10/02/2021 ?Discharge date: 10/04/2021 ? ?Primary Diagnosis: Degenerative spondylolisthesis L4-L5 ?Admission Diagnoses: Status post TLIF L4-L5 ?Past Medical History:  ?Diagnosis Date  ? Arthritis   ? Chronic back pain   ? Chronic headaches   ? Depression   ? HIV infection (HCC)   ? Joint pain   ? Localized swelling of both lower legs   ? Legs, feet and hands  ? Pre-diabetes   ? Pulmonary embolism (HCC)   ? on Xarelto  ? Schizoaffective disorder (HCC)   ? Seasonal allergies   ? Stroke (cerebrum) (HCC)   ? patient denies, states it was a heat stroke  ? Weight loss   ? ?Discharge Diagnoses:   ?Principal Problem: ?  S/P lumbar fusion ? ?Estimated body mass index is 34.52 kg/m? as calculated from the following: ?  Height as of this encounter: 5\' 8"  (1.727 m). ?  Weight as of this encounter: 103 kg. ? ?Procedure:  ?Procedure(s) (LRB): ?TRANSFORAMINAL LUMBAR INTERBODY FUSION LUMBAR FOUR THROUGH FIVE (N/A)  ? ?Consults: None ? ?HPI: Tracy [name] is a 56 year old female presenting today with the office by Dr. 57 for worsening of progressive low back, buttock, and radicular left leg pain status post previous L5-S1 ALIF fusion.  Risk and benefits of surgery and after trial of conservative treatment patient elected to proceed with a TLIF L4-L5.  She presented to the hospital on 10/02/2021 for surgery and was admitted for postoperative monitoring. ? ?Laboratory Data: ?Hospital Outpatient Visit on 09/29/2021  ?Component Date Value Ref Range Status  ? WBC 09/29/2021 5.4  4.0 - 10.5 K/uL Final  ? RBC 09/29/2021 4.49  3.87 - 5.11 MIL/uL Final  ? Hemoglobin 09/29/2021 14.1  12.0 - 15.0 g/dL Final  ? HCT 10/01/2021 42.7  36.0 - 46.0 % Final  ? MCV 09/29/2021 95.1  80.0 - 100.0 fL Final  ? MCH 09/29/2021 31.4  26.0 - 34.0 pg Final  ? MCHC 09/29/2021 33.0  30.0 - 36.0 g/dL Final  ? RDW 10/01/2021 12.8  11.5 - 15.5 % Final  ? Platelets 09/29/2021  291  150 - 400 K/uL Final  ? nRBC 09/29/2021 0.0  0.0 - 0.2 % Final  ? Performed at [facility], [address], [place], [place]  ? Sodium 09/29/2021 143  135 - 145 mmol/L Final  ? Potassium 09/29/2021 4.2  3.5 - 5.1 mmol/L Final  ? Chloride 09/29/2021 111  98 - 111 mmol/L Final  ? CO2 09/29/2021 24  22 - 32 mmol/L Final  ? Glucose, Bld 09/29/2021 89  70 - 99 mg/dL Final  ? Glucose reference range applies only to samples taken after fasting for at least 8 hours.  ? BUN 09/29/2021 12  6 - 20 mg/dL Final  ? Creatinine, Ser 09/29/2021 1.23 (H)  0.44 - 1.00 mg/dL Final  ? Calcium 10/01/2021 9.9  8.9 - 10.3 mg/dL Final  ? GFR, Estimated 09/29/2021 52 (L)  >60 mL/min Final  ? Comment: (NOTE) ?Calculated using the CKD-EPI Creatinine Equation (2021) ?  ? Anion gap 09/29/2021 8  5 - 15 Final  ? Performed at [facility], [address] [place], [place]  ? Color, Urine 09/29/2021 YELLOW  YELLOW Final  ? APPearance 09/29/2021 CLEAR  CLEAR Final  ? Specific Gravity, Urine 09/29/2021 1.016  1.005 - 1.030 Final  ? pH 09/29/2021 7.0  5.0 - 8.0 Final  ? Glucose,  UA 09/29/2021 NEGATIVE  NEGATIVE mg/dL Final  ? Hgb urine dipstick 09/29/2021 NEGATIVE  NEGATIVE Final  ? Bilirubin Urine 09/29/2021 NEGATIVE  NEGATIVE Final  ? Ketones, ur 09/29/2021 NEGATIVE  NEGATIVE mg/dL Final  ? Protein, ur 29/92/4268 NEGATIVE  NEGATIVE mg/dL Final  ? Nitrite 34/19/6222 NEGATIVE  NEGATIVE Final  ? Leukocytes,Ua 09/29/2021 NEGATIVE  NEGATIVE Final  ? Performed at [facility], [address] [place], [place] [address]  ? ABO/RH(D) 09/29/2021 O POS   Final  ? Antibody Screen 09/29/2021 NEG   Final  ? Sample Expiration 09/29/2021 10/13/2021,2359   Final  ? Extend sample reason 09/29/2021    Final  ?                 Value:NO TRANSFUSIONS OR PREGNANCY IN THE PAST 3 MONTHS ?Performed at [facility], [address], [place], [place] [address] ?  ? MRSA, PCR 09/29/2021 NEGATIVE  NEGATIVE Final  ? Staphylococcus  aureus 09/29/2021 NEGATIVE  NEGATIVE Final  ? Comment: (NOTE) ?The Xpert SA Assay (FDA approved for NASAL specimens in patients 16 ?years of age and older), is one component of a comprehensive ?surveillance program. It is not intended to diagnose infection nor to ?guide or monitor treatment. ?Performed at [facility], [address], [place], [place] ?[address] ?  ?Office Visit on 08/12/2021  ?Component Date Value Ref Range Status  ? Hep B S Ab 08/12/2021 NON-REACTIVE  NON-REACTIVE Final  ? Hepatitis B Surface Ag 08/12/2021 NON-REACTIVE  NON-REACTIVE Final  ? Hep B Core Total Ab 08/12/2021 NON-REACTIVE  NON-REACTIVE Final  ? Hepatitis C Ab 08/12/2021 NON-REACTIVE  NON-REACTIVE Final  ? SIGNAL TO CUT-OFF 08/12/2021 0.05  <1.00 Final  ? Comment: . ?HCV antibody was non-reactive. There is no laboratory  ?evidence of HCV infection. ?. ?In most cases, no further action is required. However, ?if recent HCV exposure is suspected, a test for HCV RNA ?(test code 14481) is suggested. ?. ?For additional information please refer to ?http://education.quest[facility].com/faq/FAQ22v1 ?(This link is being provided for informational/ ?educational purposes only.) ?. ?  ? Hepatitis A AB,Total 08/12/2021 REACTIVE (A)  NON-REACTIVE Final  ? Comment: . ?For additional information, please refer to  ?http://education.[facility].com/faq/FAQ202  ?(This link is being provided for informational/ ?educational purposes only.) ?. ?  ?  ? ?X-Rays:DG Lumbar Spine 2-3 Views ? ?Result Date: 10/02/2021 ?CLINICAL DATA:  L4-L5 TLIF EXAM: LUMBAR SPINE - 2-3 VIEW COMPARISON:  Intraoperative lumbar spine radiographs 07/27/2018 lumbar spine radiographs 02/05/2017 FINDINGS: Three C-arm fluoroscopic images were obtained intraoperatively and submitted for post operative interpretation. Has been interval posterior instrumented fusion at L4-L5 with interbody spacer placement. Pre-existing anterior fusion hardware at L5-S1 is again seen, stable in  appearance. There is no evidence of immediate complication. Fluoro time 3 minutes 38 seconds, dose 100.47 mGy. Please see the performing provider's procedural report for further detail. IMPRESSION: Intraoperative images during L4-L5 posterior instrumented fusion without evidence of immediate complication. Electronically Signed   By: Lesia Hausen M.D.   On: 10/02/2021 15:29  ? ?DG Shoulder Right ? ?Result Date: 09/10/2021 ?CLINICAL DATA:  Pain, fall EXAM: RIGHT SHOULDER - 2+ VIEW COMPARISON:  None. FINDINGS: There is no evidence of fracture or dislocation. There is no evidence of arthropathy or other focal bone abnormality. Soft tissues are unremarkable. IMPRESSION: Negative. Electronically Signed   By: Charlett Nose M.D.   On: 09/10/2021 19:50  ? ?DG Tibia/Fibula Right ? ?Result Date: 09/10/2021 ?CLINICAL DATA:  Fall, pain EXAM:  RIGHT TIBIA AND FIBULA - 2 VIEW COMPARISON:  None. FINDINGS: There is no evidence of fracture or other focal bone lesions. Soft tissues are unremarkable. IMPRESSION: Negative. Electronically Signed   By: Charlett NoseKevin  Dover M.D.   On: 09/10/2021 19:50  ? ?CT Head Wo Contrast ? ?Result Date: 09/10/2021 ?CLINICAL DATA:  Head and neck trauma, fall. EXAM: CT HEAD WITHOUT CONTRAST CT CERVICAL SPINE WITHOUT CONTRAST TECHNIQUE: Multidetector CT imaging of the head and cervical spine was performed following the standard protocol without intravenous contrast. Multiplanar CT image reconstructions of the cervical spine were also generated. RADIATION DOSE REDUCTION: This exam was performed according to the departmental dose-optimization program which includes automated exposure control, adjustment of the mA and/or kV according to patient size and/or use of iterative reconstruction technique. COMPARISON:  12/29/2016. FINDINGS: CT HEAD FINDINGS Brain: No acute intracranial hemorrhage, midline shift or mass effect. No extra-axial fluid collection. Gray-white matter differentiation is within normal limits. No  hydrocephalus. Vascular: No hyperdense vessel or unexpected calcification. Skull: No acute fracture. Sinuses/Orbits: There is bony deformity of the medial orbital wall on the left which is unchanged from the prior

## 2021-10-04 NOTE — Progress Notes (Signed)
Occupational Therapy Treatment ?Patient Details ?Name: Tracy [name] ?MRN: [id number] ?DOB: 07-21-1965 ?Today's Date: 10/04/2021 ? ? ?History of present illness 56 y/o female admitted on 10/02/21 following TLIF L4-5. PMH: HIV, schizoaffective disorder ?  ?OT comments ? Patient issued reacher and sponge for home use.  Primary c/o back and L calf pain.  Doppler planned prior to discharge. Spouse will be at the home 24 hour to assist as needed.  All education completed, and patient verbalizes understanding.  DME ordered, and no further OT needed.     ? ?Recommendations for follow up therapy are one component of a multi-disciplinary discharge planning process, led by the attending physician.  Recommendations may be updated based on patient status, additional functional criteria and insurance authorization. ?   ?Follow Up Recommendations ?    ?  ?Assistance Recommended at Discharge Intermittent Supervision/Assistance  ?Patient can return home with the following ? A little help with bathing/dressing/bathroom;Assistance with cooking/housework;Assist for transportation;Help with stairs or ramp for entrance ?  ?Equipment Recommendations ? BSC/3in1  ?  ?Recommendations for Other Services   ? ?  ?Precautions / Restrictions Precautions ?Precautions: Back ?Precaution Booklet Issued: Yes (comment) ?Required Braces or Orthoses: Spinal Brace ?Spinal Brace: Lumbar corset ?Restrictions ?Weight Bearing Restrictions: No  ? ? ?  ? ?Mobility Bed Mobility ?Overal bed mobility: Needs Assistance ?Bed Mobility: Sidelying to Sit ?  ?Sidelying to sit: Supervision ?  ?  ?  ?  ?  ? ?Transfers ?Overall transfer level: Needs assistance ?Equipment used: Rolling walker (2 wheels) ?Transfers: Sit to/from Stand ?Sit to Stand: Supervision ?  ?  ?  ?  ?  ?  ?  ?  ?Balance Overall balance assessment: Needs assistance ?Sitting-balance support: No upper extremity supported, Feet supported ?Sitting balance-Leahy Scale: Good ?  ?  ?Standing balance support:  Bilateral upper extremity supported, Reliant on assistive device for balance ?Standing balance-Leahy Scale: Fair ?  ?  ?  ?  ?  ?  ?  ?  ?  ?  ?  ?  ?   ? ?ADL either performed or assessed with clinical judgement  ? ?ADL   ?  ?  ?  ?  ?  ?  ?  ?  ?  ?  ?  ?  ?  ?  ?  ?  ?  ?  ?Functional mobility during ADLs: Supervision/safety;Rolling walker (2 wheels) ?General ADL Comments: Min A with reacher to donn pants.  Assist as needed for socks via spouse. ?  ? ?Extremity/Trunk Assessment   ?  ?  ?  ?  ?  ? ?Vision   ?  ?  ?Perception   ?  ?Praxis   ?  ? ?Cognition Arousal/Alertness: Awake/alert ?Behavior During Therapy: [facility] [place] for tasks assessed/performed ?Overall Cognitive Status: Within Functional Limits for tasks assessed ?  ?  ?  ?  ?  ?  ?  ?  ?  ?  ?  ?  ?  ?  ?  ?  ?  ?  ?  ?   ?Exercises   ? ?  ?Shoulder Instructions   ? ? ?  ?General Comments    ? ? ?Pertinent Vitals/ Pain       Pain Assessment ?Pain Assessment: Faces ?Faces Pain Scale: Hurts little more ?Pain Location: back ?Pain Descriptors / Indicators: Guarding, Grimacing ?Pain Intervention(s): Monitored during session ? ?Home Living   ?  ?  ?  ?  ?  ?  ?  ?  ?  ?  ?  ?  ?  ?  ?  ?  ?  ?  ? ?  ?  Prior Functioning/Environment    ?  ?  ?  ?   ? ?Frequency ?    ? ? ? ? ?  ?Progress Toward Goals ? ?OT Goals(current goals can now be found in the care plan section) ? Progress towards OT goals: Progressing toward goals ? ?Acute Rehab OT Goals ?OT Goal Formulation: With patient ?Time For Goal Achievement: 10/17/21 ?Potential to Achieve Goals: Good  ?Plan All goals met and education completed, patient discharged from OT services   ? ?Co-evaluation ? ? ?   ?  ?  ?  ?  ? ?  ?AM-PAC OT "6 Clicks" Daily Activity     ?Outcome Measure ? ? Help from another person eating meals?: None ?Help from another person taking care of personal grooming?: None ?Help from another person toileting, which includes using toliet, bedpan, or urinal?: A Little ?Help from another person bathing  (including washing, rinsing, drying)?: A Little ?Help from another person to put on and taking off regular upper body clothing?: None ?Help from another person to put on and taking off regular lower body clothing?: A Little ?6 Click Score: 21 ? ?  ?End of Session Equipment Utilized During Treatment: Rolling walker (2 wheels);Back brace ? ?OT Visit Diagnosis: Unsteadiness on feet (R26.81);Muscle weakness (generalized) (M62.81);Pain ?  ?Activity Tolerance Patient tolerated treatment well ?  ?Patient Left in chair;with call bell/phone within reach;with family/visitor present ?  ?Nurse Communication   ?  ? ?   ? ?Time: 2297-9892 ?OT Time Calculation (min): 18 min ? ?Charges: OT General Charges ?$OT Visit: 1 Visit ?OT Treatments ?$Self Care/Home Management : 8-22 mins ? ?10/04/2021 ? ?RP, OTR/L ? ?Acute Rehabilitation Services ? ?Office:  [phone] ? ? ?Durelle Zepeda D Durene Dodge ?10/04/2021, 9:00 AM ?

## 2021-10-06 ENCOUNTER — Encounter (HOSPITAL_COMMUNITY): Payer: Self-pay | Admitting: Orthopedic Surgery

## 2021-11-04 ENCOUNTER — Ambulatory Visit: Payer: Medicaid Other

## 2021-12-08 ENCOUNTER — Encounter (HOSPITAL_COMMUNITY): Payer: Self-pay | Admitting: Emergency Medicine

## 2021-12-08 ENCOUNTER — Emergency Department (HOSPITAL_COMMUNITY)
Admission: EM | Admit: 2021-12-08 | Discharge: 2021-12-08 | Disposition: A | Discharge status: Home / Self Care | Payer: Medicaid Other | Attending: Emergency Medicine | Admitting: Emergency Medicine

## 2021-12-08 ENCOUNTER — Emergency Department (HOSPITAL_COMMUNITY): Payer: Medicaid Other

## 2021-12-08 ENCOUNTER — Other Ambulatory Visit: Payer: Self-pay

## 2021-12-08 DIAGNOSIS — Z21 Asymptomatic human immunodeficiency virus [HIV] infection status: Secondary | ICD-10-CM | POA: Diagnosis not present

## 2021-12-08 DIAGNOSIS — R519 Headache, unspecified: Secondary | ICD-10-CM | POA: Diagnosis present

## 2021-12-08 DIAGNOSIS — G43009 Migraine without aura, not intractable, without status migrainosus: Secondary | ICD-10-CM | POA: Insufficient documentation

## 2021-12-08 LAB — CBC WITH DIFFERENTIAL/PLATELET
Abs Immature Granulocytes: 0.01 10*3/uL (ref 0.00–0.07)
Basophils Absolute: 0 10*3/uL (ref 0.0–0.1)
Basophils Relative: 1 %
Eosinophils Absolute: 0 10*3/uL (ref 0.0–0.5)
Eosinophils Relative: 1 %
HCT: 39.5 % (ref 36.0–46.0)
Hemoglobin: 12.6 g/dL (ref 12.0–15.0)
Immature Granulocytes: 0 %
Lymphocytes Relative: 46 %
Lymphs Abs: 2 10*3/uL (ref 0.7–4.0)
MCH: 30.3 pg (ref 26.0–34.0)
MCHC: 31.9 g/dL (ref 30.0–36.0)
MCV: 95 fL (ref 80.0–100.0)
Monocytes Absolute: 0.7 10*3/uL (ref 0.1–1.0)
Monocytes Relative: 16 %
Neutro Abs: 1.6 10*3/uL — ABNORMAL LOW (ref 1.7–7.7)
Neutrophils Relative %: 36 %
Platelets: 267 10*3/uL (ref 150–400)
RBC: 4.16 MIL/uL (ref 3.87–5.11)
RDW: 13.2 % (ref 11.5–15.5)
WBC: 4.3 10*3/uL (ref 4.0–10.5)
nRBC: 0 % (ref 0.0–0.2)

## 2021-12-08 LAB — BASIC METABOLIC PANEL
Anion gap: 7 (ref 5–15)
BUN: 9 mg/dL (ref 6–20)
CO2: 23 mmol/L (ref 22–32)
Calcium: 9.7 mg/dL (ref 8.9–10.3)
Chloride: 110 mmol/L (ref 98–111)
Creatinine, Ser: 1.18 mg/dL — ABNORMAL HIGH (ref 0.44–1.00)
GFR, Estimated: 55 mL/min — ABNORMAL LOW (ref >60)
Glucose, Bld: 99 mg/dL (ref 70–99)
Potassium: 3.8 mmol/L (ref 3.5–5.1)
Sodium: 140 mmol/L (ref 135–145)

## 2021-12-08 MED ORDER — SODIUM CHLORIDE 0.9 % IV BOLUS
1000.0000 mL | Freq: Once | INTRAVENOUS | Status: AC
  Administered 2021-12-08: 1000 mL via INTRAVENOUS

## 2021-12-08 MED ORDER — DIPHENHYDRAMINE HCL 50 MG/ML IJ SOLN
12.5000 mg | Freq: Once | INTRAMUSCULAR | Status: AC
  Administered 2021-12-08: 12.5 mg via INTRAVENOUS
  Filled 2021-12-08: qty 1

## 2021-12-08 MED ORDER — PROCHLORPERAZINE EDISYLATE 10 MG/2ML IJ SOLN
10.0000 mg | Freq: Once | INTRAMUSCULAR | Status: AC
  Administered 2021-12-08: 10 mg via INTRAVENOUS
  Filled 2021-12-08: qty 2

## 2021-12-08 MED ORDER — DEXAMETHASONE SODIUM PHOSPHATE 10 MG/ML IJ SOLN
10.0000 mg | Freq: Once | INTRAMUSCULAR | Status: AC
  Administered 2021-12-08: 10 mg via INTRAVENOUS
  Filled 2021-12-08: qty 1

## 2021-12-08 NOTE — ED Provider Notes (Signed)
[facility] EMERGENCY DEPARTMENT Provider Note   CSN: [id number] Arrival date & time: 12/08/21  1014     History  Chief Complaint  Patient presents with   Headache    Tracy [name] is a 56 y.o. female.  Patient here with headache for the last several days.  Frontal headache, slightly worse than her typical migraines.  Took Tylenol without much improvement.  She is on a blood thinner and cannot take ibuprofen.  Nothing has helped.  Nothing makes it better.  Denies any fever or neck pain.  History of HIV on antivirals.  Last HIV counts several months ago were normal.  Denies any trauma.  Headache for the last 3 days.  Has had photophobia.  Denies any vision changes, weakness, numbness.  No chest pain or abdominal pain.  The history is provided by the patient.      Home Medications Prior to Admission medications   Medication Sig Start Date End Date Taking? Authorizing Provider  buPROPion (WELLBUTRIN XL) 150 MG 24 hr tablet Take 150 mg by mouth every morning. 07/31/21   [provider]  calcium carbonate (OSCAL) 1500 (600 Ca) MG TABS tablet Take 600 mg of elemental calcium by mouth daily with breakfast.    [provider]  dolutegravir (TIVICAY) 50 MG tablet Take 1 tablet (50 mg total) by mouth daily. 08/12/21 02/08/22  Kathlynn Grate, DO  doxepin (SINEQUAN) 25 MG capsule Take 25 mg by mouth at bedtime.    [provider]  emtricitabine-rilpivir-tenofovir DF (COMPLERA) 200-25-300 MG tablet Take 1 tablet by mouth daily. 08/12/21   Kathlynn Grate, DO  fexofenadine (ALLEGRA) 180 MG tablet Take 180 mg by mouth daily.    [provider]  gabapentin (NEURONTIN) 300 MG capsule Take 1 capsule (300 mg total) by mouth 3 (three) times daily as needed for up to 14 days. 10/04/21 10/18/21  McClung, Fuller Plan D, PA  ondansetron (ZOFRAN) 4 MG tablet Take 1 tablet (4 mg total) by mouth every 8 (eight) hours as needed for nausea or vomiting. 10/04/21    McClung, Fuller Plan D, PA  QUEtiapine (SEROQUEL XR) 300 MG 24 hr tablet Take 300 mg by mouth at bedtime.    [provider]  rivaroxaban (XARELTO) 20 MG TABS tablet Take 20 mg by mouth at bedtime. 07/10/20   [provider]      Allergies    Pork-derived products, Flexeril [cyclobenzaprine], Moxifloxacin, Tetracyclines & related, and Tramadol    Review of Systems   Review of Systems  Physical Exam Updated Vital Signs BP 129/86   Pulse 76   Temp 98.3 F (36.8 C)   Resp 16   Ht 5\' 8"  (1.727 m)   Wt 99.8 kg   SpO2 98%   BMI 33.45 kg/m  Physical Exam Vitals and nursing note reviewed.  Constitutional:      General: She is not in acute distress.    Appearance: She is well-developed. She is not ill-appearing.  HENT:     Head: Normocephalic and atraumatic.  Eyes:     General: No visual field deficit.    Extraocular Movements: Extraocular movements intact.     Right eye: Normal extraocular motion and no nystagmus.     Left eye: Normal extraocular motion and no nystagmus.     Conjunctiva/sclera: Conjunctivae normal.     Pupils: Pupils are equal, round, and reactive to light.     Right eye: Pupil is round and reactive.     Left  eye: Pupil is round and reactive.  Cardiovascular:     Rate and Rhythm: Normal rate and regular rhythm.     Heart sounds: No murmur heard. Pulmonary:     Effort: Pulmonary effort is normal. No respiratory distress.     Breath sounds: Normal breath sounds.  Abdominal:     Palpations: Abdomen is soft.     Tenderness: There is no abdominal tenderness.  Musculoskeletal:        General: No swelling.     Cervical back: Normal range of motion and neck supple.  Skin:    General: Skin is warm and dry.     Capillary Refill: Capillary refill takes less than 2 seconds.  Neurological:     Mental Status: She is alert and oriented to person, place, and time.     Cranial Nerves: No cranial nerve deficit, dysarthria or facial asymmetry.     Sensory:  No sensory deficit.     Motor: No weakness.     Coordination: Coordination normal.     Comments: 5+ out of 5 strength throughout, normal sensation, no drift, normal finger-nose-finger, normal speech  Psychiatric:        Mood and Affect: Mood normal.    ED Results / Procedures / Treatments   Labs (all labs ordered are listed, but only abnormal results are displayed) Labs Reviewed  CBC WITH DIFFERENTIAL/PLATELET - Abnormal; Notable for the following components:      Result Value   Neutro Abs 1.6 (*)    All other components within normal limits  BASIC METABOLIC PANEL - Abnormal; Notable for the following components:   Creatinine, Ser 1.18 (*)    GFR, Estimated 55 (*)    All other components within normal limits    EKG None  Radiology CT Head Wo Contrast  Result Date: 12/08/2021 CLINICAL DATA:  Headache. EXAM: CT HEAD WITHOUT CONTRAST TECHNIQUE: Contiguous axial images were obtained from the base of the skull through the vertex without intravenous contrast. RADIATION DOSE REDUCTION: This exam was performed according to the departmental dose-optimization program which includes automated exposure control, adjustment of the mA and/or kV according to patient size and/or use of iterative reconstruction technique. COMPARISON:  09/10/2021. FINDINGS: Brain: No evidence of acute infarction, hemorrhage, hydrocephalus, extra-axial collection or mass lesion/mass effect. Vascular: No hyperdense vessel or unexpected calcification. Skull: Normal. Negative for fracture or focal lesion. Sinuses/Orbits: Globes and orbits are unremarkable. Visualized sinuses are clear. Other: None. IMPRESSION: Normal enhanced CT scan of the brain. Electronically Signed   By: Amie Portland M.D.   On: 12/08/2021 11:25    Procedures Procedures    Medications Ordered in ED Medications  dexamethasone (DECADRON) injection 10 mg (has no administration in time range)  prochlorperazine (COMPAZINE) injection 10 mg (10 mg  Intravenous Given 12/08/21 1109)  diphenhydrAMINE (BENADRYL) injection 12.5 mg (12.5 mg Intravenous Given 12/08/21 1107)  sodium chloride 0.9 % bolus 1,000 mL (1,000 mLs Intravenous New Bag/Given 12/08/21 1107)    ED Course/ Medical Decision Making/ A&P                           Medical Decision Making Amount and/or Complexity of Data Reviewed Labs: ordered. Radiology: ordered.  Risk Prescription drug management.   Tracy [name] is a 56 year old female with history of HIV, schizophrenia, chronic headaches who presents to the ED with headache.  Normal vitals.  No fever.  Per chart review her last HIV and CD4 counts were normal.  She has had headache for the last 3 days.  Photophobia and frontal headache.  Not the worst headache of her life.  Tylenol has helped a little bit.  Neurologically she is intact.  She has no fever and no concern for infectious process.  I have no concern for meningitis or stroke.  But this is slightly worse than her normal migraines we will get a head CT.  Differential diagnosis is likely migraine but will rule out head bleed or other issues like head mass.  Will give IV Compazine, Benadryl, fluid bolus and check basic labs.  Per my review and interpretation of labs there is no significant anemia, electrolyte abnormality or kidney injury.  CT scan per my review and interpretation is unremarkable.  Patient feeling much better after headache cocktail.  Overall suspect migraine.  Discharged in good condition.  This chart was dictated using voice recognition software.  Despite best efforts to proofread,  errors can occur which can change the documentation meaning.         Final Clinical Impression(s) / ED Diagnoses Final diagnoses:  Migraine without aura and without status migrainosus, not intractable    Rx / DC Orders ED Discharge Orders     None         Virgina NorfolkCuratolo, Toshika Parrow, DO 12/08/21 1317

## 2021-12-08 NOTE — ED Triage Notes (Signed)
Pt with hx of migraines here for three days of severe headache accompanied by nausea and photophobia. Has tried Tylenol without relief and cannot take ibuprofen d/t taking a blood thinner.

## 2021-12-08 NOTE — ED Notes (Signed)
Patient transported to CT 

## 2021-12-19 ENCOUNTER — Other Ambulatory Visit: Payer: Self-pay

## 2021-12-19 ENCOUNTER — Encounter (HOSPITAL_COMMUNITY): Payer: Self-pay | Admitting: *Deleted

## 2021-12-19 ENCOUNTER — Emergency Department (HOSPITAL_COMMUNITY): Payer: Medicaid Other

## 2021-12-19 ENCOUNTER — Emergency Department (HOSPITAL_COMMUNITY)
Admission: EM | Admit: 2021-12-19 | Discharge: 2021-12-20 | Disposition: A | Discharge status: Home / Self Care | Payer: Medicaid Other | Attending: Emergency Medicine | Admitting: Emergency Medicine

## 2021-12-19 DIAGNOSIS — R079 Chest pain, unspecified: Secondary | ICD-10-CM | POA: Insufficient documentation

## 2021-12-19 DIAGNOSIS — Z21 Asymptomatic human immunodeficiency virus [HIV] infection status: Secondary | ICD-10-CM | POA: Diagnosis not present

## 2021-12-19 DIAGNOSIS — R0602 Shortness of breath: Secondary | ICD-10-CM | POA: Insufficient documentation

## 2021-12-19 DIAGNOSIS — Z7901 Long term (current) use of anticoagulants: Secondary | ICD-10-CM | POA: Insufficient documentation

## 2021-12-19 DIAGNOSIS — R7989 Other specified abnormal findings of blood chemistry: Secondary | ICD-10-CM | POA: Insufficient documentation

## 2021-12-19 LAB — BASIC METABOLIC PANEL
Anion gap: 9 (ref 5–15)
BUN: 13 mg/dL (ref 6–20)
CO2: 22 mmol/L (ref 22–32)
Calcium: 9.6 mg/dL (ref 8.9–10.3)
Chloride: 111 mmol/L (ref 98–111)
Creatinine, Ser: 1.32 mg/dL — ABNORMAL HIGH (ref 0.44–1.00)
GFR, Estimated: 48 mL/min — ABNORMAL LOW (ref >60)
Glucose, Bld: 133 mg/dL — ABNORMAL HIGH (ref 70–99)
Potassium: 3.7 mmol/L (ref 3.5–5.1)
Sodium: 142 mmol/L (ref 135–145)

## 2021-12-19 LAB — CBC
HCT: 39.4 % (ref 36.0–46.0)
Hemoglobin: 13 g/dL (ref 12.0–15.0)
MCH: 30.9 pg (ref 26.0–34.0)
MCHC: 33 g/dL (ref 30.0–36.0)
MCV: 93.6 fL (ref 80.0–100.0)
Platelets: 290 10*3/uL (ref 150–400)
RBC: 4.21 MIL/uL (ref 3.87–5.11)
RDW: 13.2 % (ref 11.5–15.5)
WBC: 6.7 10*3/uL (ref 4.0–10.5)
nRBC: 0 % (ref 0.0–0.2)

## 2021-12-19 LAB — I-STAT BETA HCG BLOOD, ED (MC, WL, AP ONLY): I-stat hCG, quantitative: <5 m[IU]/mL (ref <5)

## 2021-12-19 LAB — TROPONIN I (HIGH SENSITIVITY)
Troponin I (High Sensitivity): 3 ng/L (ref <18)
Troponin I (High Sensitivity): 3 ng/L (ref <18)

## 2021-12-19 LAB — TSH: TSH: 0.656 u[IU]/mL (ref 0.350–4.500)

## 2021-12-19 MED ORDER — IOHEXOL 350 MG/ML SOLN
57.0000 mL | Freq: Once | INTRAVENOUS | Status: AC | PRN
  Administered 2021-12-19: 57 mL via INTRAVENOUS

## 2021-12-19 MED ORDER — FENTANYL CITRATE PF 50 MCG/ML IJ SOSY
50.0000 ug | PREFILLED_SYRINGE | Freq: Once | INTRAMUSCULAR | Status: AC
  Administered 2021-12-19: 50 ug via INTRAVENOUS
  Filled 2021-12-19: qty 1

## 2021-12-19 NOTE — ED Triage Notes (Addendum)
Pt c/o R sided chest pain onset 30 mins ago while watching a pull. Described as a pulling sensation, nonradiating. Hx of PE, denies missing any doses of Xeralto

## 2021-12-19 NOTE — ED Provider Triage Note (Signed)
Emergency Medicine Provider Triage Evaluation Note  Tracy [name] , a 56 y.o. female  was evaluated in triage.  Pt complains of sudden onset chest pain 2 hours ago.  Feels as though something is pulling inside.  Does not radiate anywhere.  History of PE, still on Xarelto.  Says she has not missed any doses.  No associated cough.  Review of Systems  Positive: Chest pain, diaphoresis, mild shortness of breath, dizziness Negative: Syncope  Physical Exam  BP 109/75 (BP Location: Right Arm)   Pulse (!) 130   Temp 97.6 F (36.4 C) (Oral)   Resp (!) 24   SpO2 100%  Gen:   Awake, no distress   Resp:  Normal effort  MSK:   Moves extremities without difficulty  Other:  Diaphoretic in triage.  Groaning in pain.  Says the pain is very severe.  Tachycardic, normal rhythm.  Lung sounds clear  Medical Decision Making  Medically screening exam initiated at 8:35 PM.  Appropriate orders placed.  Tracy [name] was informed that the remainder of the evaluation will be completed by another provider, this initial triage assessment does not replace that evaluation, and the importance of remaining in the ED until their evaluation is complete.    Needs next room.  Nursing staff made aware.  No history of ACS   Tracy Benders, PA-C 12/19/21 2036

## 2021-12-20 NOTE — Discharge Instructions (Signed)
You were seen today for chest pain.  Your work-up was reassuring for no signs of ACS or pulmonary embolism.  I recommend that you follow-up with your primary care provider or cardiologist as needed

## 2021-12-20 NOTE — ED Provider Notes (Signed)
[facility] Provider Note   CSN: [id number] Arrival date & time: 12/19/21  2013     History  Chief Complaint  Patient presents with   Chest Pain    Tracy [name] is a 56 y.o. female.  Patient complains of right-sided chest pain that began approximately 730 tonight.  Patient describes as a pulling sensation on the right side of her chest.  Denies any radiation.  The patient does endorse mild shortness of breath at onset.  The patient has a history of PE and currently takes Xarelto.  Patient states she has not missed any doses of Xarelto.  Denies abdominal pain, nausea, vomiting.  Medical history significant for HIV, history of PE on Xarelto, arthritis, history of stroke, schizoaffective disorder  HPI     Home Medications Prior to Admission medications   Medication Sig Start Date End Date Taking? Authorizing Provider  buPROPion (WELLBUTRIN XL) 150 MG 24 hr tablet Take 150 mg by mouth every morning. 07/31/21   [provider]  calcium carbonate (OSCAL) 1500 (600 Ca) MG TABS tablet Take 600 mg of elemental calcium by mouth daily with breakfast.    [provider]  dolutegravir (TIVICAY) 50 MG tablet Take 1 tablet (50 mg total) by mouth daily. 08/12/21 02/08/22  Kathlynn GrateWallace, Andrew N, DO  doxepin (SINEQUAN) 25 MG capsule Take 25 mg by mouth at bedtime.    [provider]  emtricitabine-rilpivir-tenofovir DF (COMPLERA) 200-25-300 MG tablet Take 1 tablet by mouth daily. 08/12/21   Kathlynn GrateWallace, Andrew N, DO  fexofenadine (ALLEGRA) 180 MG tablet Take 180 mg by mouth daily.    [provider]  gabapentin (NEURONTIN) 300 MG capsule Take 1 capsule (300 mg total) by mouth 3 (three) times daily as needed for up to 14 days. 10/04/21 10/18/21  McClung, Fuller PlanKevan D, PA  ondansetron (ZOFRAN) 4 MG tablet Take 1 tablet (4 mg total) by mouth every 8 (eight) hours as needed for nausea or vomiting. 10/04/21   McClung, Fuller PlanKevan D, PA  QUEtiapine (SEROQUEL  XR) 300 MG 24 hr tablet Take 300 mg by mouth at bedtime.    [provider]  rivaroxaban (XARELTO) 20 MG TABS tablet Take 20 mg by mouth at bedtime. 07/10/20   [provider]      Allergies    Pork-derived products, Flexeril [cyclobenzaprine], Moxifloxacin, Tetracyclines & related, and Tramadol    Review of Systems   Review of Systems  Constitutional:  Negative for fever.  Respiratory:  Positive for shortness of breath.   Cardiovascular:  Positive for chest pain.  Gastrointestinal:  Negative for abdominal pain, nausea and vomiting.  Musculoskeletal:  Negative for back pain and myalgias.    Physical Exam Updated Vital Signs BP 117/81   Pulse 78   Temp 97.6 F (36.4 C) (Oral)   Resp 14   SpO2 100%  Physical Exam Vitals and nursing note reviewed.  Constitutional:      General: She is not in acute distress. HENT:     Head: Normocephalic and atraumatic.  Eyes:     Extraocular Movements: Extraocular movements intact.  Cardiovascular:     Rate and Rhythm: Normal rate and regular rhythm.     Heart sounds: Normal heart sounds.  Pulmonary:     Effort: Pulmonary effort is normal. No respiratory distress.     Breath sounds: Normal breath sounds.  Chest:     Chest wall: No tenderness.  Musculoskeletal:     Cervical back: Normal range of motion  and neck supple.     Right lower leg: No edema.     Left lower leg: No edema.  Neurological:     Mental Status: She is alert.     ED Results / Procedures / Treatments   Labs (all labs ordered are listed, but only abnormal results are displayed) Labs Reviewed  BASIC METABOLIC PANEL - Abnormal; Notable for the following components:      Result Value   Glucose, Bld 133 (*)    Creatinine, Ser 1.32 (*)    GFR, Estimated 48 (*)    All other components within normal limits  CBC  TSH  I-STAT BETA HCG BLOOD, ED ([facility], [facility], [facility] ONLY)  TROPONIN I (HIGH SENSITIVITY)  TROPONIN I (HIGH SENSITIVITY)     EKG None  Radiology CT Angio Chest PE W and/or Wo Contrast  Result Date: 12/19/2021 CLINICAL DATA:  Pulmonary embolism (PE) suspected, high prob. Right chest pain EXAM: CT ANGIOGRAPHY CHEST WITH CONTRAST TECHNIQUE: Multidetector CT imaging of the chest was performed using the standard protocol during bolus administration of intravenous contrast. Multiplanar CT image reconstructions and MIPs were obtained to evaluate the vascular anatomy. RADIATION DOSE REDUCTION: This exam was performed according to the departmental dose-optimization program which includes automated exposure control, adjustment of the mA and/or kV according to patient size and/or use of iterative reconstruction technique. CONTRAST:  63mL OMNIPAQUE IOHEXOL 350 MG/ML SOLN COMPARISON:  None Available. FINDINGS: Cardiovascular: Adequate opacification of the pulmonary arterial tree. No intraluminal filling defect identified to suggest acute pulmonary embolism. Central pulmonary arteries are of normal caliber. No significant coronary artery calcification. Cardiac size within normal limits. No pericardial effusion. Lobulated fluid attenuation structure surrounding the right pulmonary veins at their confluence with the left atrium is compatible with a pericardial cyst. No significant atherosclerotic calcification within the thoracic aorta. No aortic aneurysm. Mediastinum/Nodes: Mild infiltration of the superior mediastinum and lobulated soft tissues surrounding the aortic arch appears stable since remote prior CTA 12/29/2016 and likely represents fluid within a superior pericardial recess. No pathologic adenopathy within the thorax. The esophagus is unremarkable. The visualized thyroid is unremarkable. Lungs/Pleura: Bibasilar atelectasis or scarring. The lungs are otherwise clear. No pneumothorax or pleural effusion. Central airways are widely patent. Upper Abdomen: No acute abnormality. Musculoskeletal: No chest wall abnormality. No acute or  significant osseous findings. Review of the MIP images confirms the above findings. IMPRESSION: No pulmonary embolism. No acute intrathoracic pathology identified. No definite radiographic explanation for the patient's reported symptoms. Electronically Signed   By: Helyn Numbers M.D.   On: 12/19/2021 23:59   DG Chest 2 View  Result Date: 12/19/2021 CLINICAL DATA:  RIGHT-sided chest pain for 30 minutes EXAM: CHEST - 2 VIEW COMPARISON:  None Available. FINDINGS: Normal cardiac silhouette. Low lung volumes. There is interstitial thickening at the lung bases seen on lateral projection. Upper lungs are clear. IMPRESSION: Interstitial thickening the lung bases seen on lateral projection could represent edema or bronchitis. Upper lungs are clear. Electronically Signed   By: Genevive Bi M.D.   On: 12/19/2021 21:45    Procedures Procedures    Medications Ordered in ED Medications  fentaNYL (SUBLIMAZE) injection 50 mcg (50 mcg Intravenous Given 12/19/21 2225)  iohexol (OMNIPAQUE) 350 MG/ML injection 57 mL (57 mLs Intravenous Contrast Given 12/19/21 2346)    ED Course/ Medical Decision Making/ A&P  Medical Decision Making Amount and/or Complexity of Data Reviewed Labs: ordered. Radiology: ordered.  Risk Prescription drug management.   This patient presents to the ED for concern of chest pain, this involves an extensive number of treatment options, and is a complaint that carries with it a high risk of complications and morbidity.  The differential diagnosis includes PE, ACS, anxiety, musculoskeletal pain, and others   Co morbidities that complicate the patient evaluation  History of PE   Additional history obtained:  Additional history obtained from family at bedside  Lab Tests:  I Ordered, and personally interpreted labs.  The pertinent results include: CBC TSH grossly normal.  Negative pregnancy test.  Creatinine 1.32, glucose 133.  Initial and repeat  troponins 3   Imaging Studies ordered:  I ordered imaging studies including CT angio PE study and chest x-ray I independently visualized and interpreted imaging which showed no pulmonary embolism or acute intrathoracic pathology I agree with the radiologist interpretation   Cardiac Monitoring: / EKG:  The patient was maintained on a cardiac monitor.  I personally viewed and interpreted the cardiac monitored which showed an underlying rhythm of: Sinus tachycardia  Problem List / ED Course / Critical interventions / Medication management   I ordered medication including fentanyl for pain Reevaluation of the patient after these medicines showed that the patient improved I have reviewed the patients home medicines and have made adjustments as needed  Test / Admission - Considered:  No sign of ACS with work-up.  PE ruled out with CT scan.  Upon reassessment the patient was resting, sleeping in the room.  Patient states her pain is completely resolved.  Her chief concern was that she did not have a repeat pulmonary embolism.  I reassured her that imaging showed no sign of PE at this time.  Patient will continue Xarelto and follow-up outpatient as needed        Final Clinical Impression(s) / ED Diagnoses Final diagnoses:  Chest pain, unspecified type    Rx / DC Orders ED Discharge Orders     None         Pamala Duffel 12/20/21 0044    Vanetta Mulders, MD 12/22/21 [phone]

## 2021-12-24 ENCOUNTER — Emergency Department (HOSPITAL_COMMUNITY): Payer: Medicaid Other

## 2021-12-24 ENCOUNTER — Emergency Department (HOSPITAL_COMMUNITY)
Admission: EM | Admit: 2021-12-24 | Discharge: 2021-12-24 | Discharge status: Against Medical Advice | Payer: Medicaid Other | Attending: Emergency Medicine | Admitting: Emergency Medicine

## 2021-12-24 ENCOUNTER — Other Ambulatory Visit: Payer: Self-pay

## 2021-12-24 DIAGNOSIS — R519 Headache, unspecified: Secondary | ICD-10-CM | POA: Diagnosis present

## 2021-12-24 DIAGNOSIS — Z5321 Procedure and treatment not carried out due to patient leaving prior to being seen by health care provider: Secondary | ICD-10-CM | POA: Diagnosis not present

## 2021-12-24 DIAGNOSIS — G43909 Migraine, unspecified, not intractable, without status migrainosus: Secondary | ICD-10-CM | POA: Diagnosis not present

## 2021-12-24 LAB — CBC WITH DIFFERENTIAL/PLATELET
Abs Immature Granulocytes: 0.02 10*3/uL (ref 0.00–0.07)
Basophils Absolute: 0 10*3/uL (ref 0.0–0.1)
Basophils Relative: 1 %
Eosinophils Absolute: 0.1 10*3/uL (ref 0.0–0.5)
Eosinophils Relative: 2 %
HCT: 42.8 % (ref 36.0–46.0)
Hemoglobin: 14 g/dL (ref 12.0–15.0)
Immature Granulocytes: 0 %
Lymphocytes Relative: 47 %
Lymphs Abs: 2.7 10*3/uL (ref 0.7–4.0)
MCH: 30.9 pg (ref 26.0–34.0)
MCHC: 32.7 g/dL (ref 30.0–36.0)
MCV: 94.5 fL (ref 80.0–100.0)
Monocytes Absolute: 0.4 10*3/uL (ref 0.1–1.0)
Monocytes Relative: 7 %
Neutro Abs: 2.5 10*3/uL (ref 1.7–7.7)
Neutrophils Relative %: 43 %
Platelets: 289 10*3/uL (ref 150–400)
RBC: 4.53 MIL/uL (ref 3.87–5.11)
RDW: 13.2 % (ref 11.5–15.5)
WBC: 5.8 10*3/uL (ref 4.0–10.5)
nRBC: 0 % (ref 0.0–0.2)

## 2021-12-24 LAB — BASIC METABOLIC PANEL
Anion gap: 10 (ref 5–15)
BUN: 14 mg/dL (ref 6–20)
CO2: 22 mmol/L (ref 22–32)
Calcium: 10.2 mg/dL (ref 8.9–10.3)
Chloride: 108 mmol/L (ref 98–111)
Creatinine, Ser: 1.34 mg/dL — ABNORMAL HIGH (ref 0.44–1.00)
GFR, Estimated: 47 mL/min — ABNORMAL LOW (ref >60)
Glucose, Bld: 92 mg/dL (ref 70–99)
Potassium: 3.8 mmol/L (ref 3.5–5.1)
Sodium: 140 mmol/L (ref 135–145)

## 2021-12-24 NOTE — ED Triage Notes (Signed)
Pt with headache since Monday. Remote hx of migraines. Endorses photophobia, chills, and nausea. Taking tylenol without relief.

## 2021-12-24 NOTE — ED Notes (Signed)
Patient states, "I will visit urgent care because I have waited too long here."

## 2021-12-24 NOTE — ED Provider Triage Note (Signed)
Emergency Medicine Provider Triage Evaluation Note  Tracy [name] , a 56 y.o. female  was evaluated in triage.  Pt complains of severe headache.  Associated with photophobia, hyperacusis.  Denies weakness, difficulty with speech, or other concerns.  Review of Systems  Positive: As above Negative: As above  Physical Exam  There were no vitals taken for this visit. Gen:   Awake, no distress   Resp:  Normal effort  MSK:   Moves extremities without difficulty  Other:  Normal speech.  Without facial droop.  Moves all extremities without difficulty.  Ambulates without difficulty.  Medical Decision Making  Medically screening exam initiated at 4:22 PM.  Appropriate orders placed.  Tracy [name] was informed that the remainder of the evaluation will be completed by another provider, this initial triage assessment does not replace that evaluation, and the importance of remaining in the ED until their evaluation is complete.    Tracy [place], PA-C 12/24/21 1624

## 2022-02-11 ENCOUNTER — Other Ambulatory Visit: Payer: Medicaid Other

## 2022-02-16 ENCOUNTER — Other Ambulatory Visit: Payer: Self-pay

## 2022-02-16 ENCOUNTER — Other Ambulatory Visit: Payer: Medicaid Other

## 2022-02-16 DIAGNOSIS — Z113 Encounter for screening for infections with a predominantly sexual mode of transmission: Secondary | ICD-10-CM

## 2022-02-16 DIAGNOSIS — B20 Human immunodeficiency virus [HIV] disease: Secondary | ICD-10-CM

## 2022-02-17 LAB — T-HELPER CELL (CD4) - (RCID CLINIC ONLY)
CD4 % Helper T Cell: 53 % (ref 33–65)
CD4 T Cell Abs: 1092 /uL (ref 400–1790)

## 2022-02-18 LAB — CBC WITH DIFFERENTIAL/PLATELET
Absolute Monocytes: 308 cells/uL (ref 200–950)
Basophils Absolute: 29 cells/uL (ref 0–200)
Basophils Relative: 0.7 %
Eosinophils Absolute: 78 cells/uL (ref 15–500)
Eosinophils Relative: 1.9 %
HCT: 39.7 % (ref 35.0–45.0)
Hemoglobin: 13.7 g/dL (ref 11.7–15.5)
Lymphs Abs: 2103 cells/uL (ref 850–3900)
MCH: 30.4 pg (ref 27.0–33.0)
MCHC: 34.5 g/dL (ref 32.0–36.0)
MCV: 88.2 fL (ref 80.0–100.0)
MPV: 10.6 fL (ref 7.5–12.5)
Monocytes Relative: 7.5 %
Neutro Abs: 1583 cells/uL (ref 1500–7800)
Neutrophils Relative %: 38.6 %
Platelets: 261 10*3/uL (ref 140–400)
RBC: 4.5 10*6/uL (ref 3.80–5.10)
RDW: 13.1 % (ref 11.0–15.0)
Total Lymphocyte: 51.3 %
WBC: 4.1 10*3/uL (ref 3.8–10.8)

## 2022-02-18 LAB — COMPLETE METABOLIC PANEL WITH GFR
AG Ratio: 1.7 (calc) (ref 1.0–2.5)
ALT: 12 U/L (ref 6–29)
AST: 14 U/L (ref 10–35)
Albumin: 4.5 g/dL (ref 3.6–5.1)
Alkaline phosphatase (APISO): 101 U/L (ref 37–153)
BUN/Creatinine Ratio: 12 (calc) (ref 6–22)
BUN: 15 mg/dL (ref 7–25)
CO2: 27 mmol/L (ref 20–32)
Calcium: 10 mg/dL (ref 8.6–10.4)
Chloride: 108 mmol/L (ref 98–110)
Creat: 1.29 mg/dL — ABNORMAL HIGH (ref 0.50–1.03)
Globulin: 2.6 g/dL (calc) (ref 1.9–3.7)
Glucose, Bld: 91 mg/dL (ref 65–99)
Potassium: 4.4 mmol/L (ref 3.5–5.3)
Sodium: 142 mmol/L (ref 135–146)
Total Bilirubin: 0.4 mg/dL (ref 0.2–1.2)
Total Protein: 7.1 g/dL (ref 6.1–8.1)
eGFR: 49 mL/min/{1.73_m2} — ABNORMAL LOW (ref >=60)

## 2022-02-18 LAB — HIV-1 RNA QUANT-NO REFLEX-BLD
HIV 1 RNA Quant: NOT DETECTED Copies/mL
HIV-1 RNA Quant, Log: NOT DETECTED Log cps/mL

## 2022-02-18 LAB — RPR: RPR Ser Ql: NONREACTIVE

## 2022-02-25 ENCOUNTER — Emergency Department (HOSPITAL_COMMUNITY)
Admission: EM | Admit: 2022-02-25 | Discharge: 2022-02-25 | Discharge status: Against Medical Advice | Payer: Medicaid Other | Attending: Emergency Medicine | Admitting: Emergency Medicine

## 2022-02-25 ENCOUNTER — Encounter (HOSPITAL_COMMUNITY): Payer: Self-pay | Admitting: Emergency Medicine

## 2022-02-25 ENCOUNTER — Ambulatory Visit (INDEPENDENT_AMBULATORY_CARE_PROVIDER_SITE_OTHER): Payer: Medicaid Other | Admitting: Internal Medicine

## 2022-02-25 ENCOUNTER — Encounter: Payer: Self-pay | Admitting: Internal Medicine

## 2022-02-25 ENCOUNTER — Emergency Department (HOSPITAL_COMMUNITY): Payer: Medicaid Other

## 2022-02-25 ENCOUNTER — Other Ambulatory Visit: Payer: Self-pay

## 2022-02-25 VITALS — BP 105/72 | HR 102 | Temp 97.9°F | Wt 211.0 lb

## 2022-02-25 DIAGNOSIS — Z5321 Procedure and treatment not carried out due to patient leaving prior to being seen by health care provider: Secondary | ICD-10-CM | POA: Diagnosis not present

## 2022-02-25 DIAGNOSIS — R202 Paresthesia of skin: Secondary | ICD-10-CM | POA: Insufficient documentation

## 2022-02-25 DIAGNOSIS — Z720 Tobacco use: Secondary | ICD-10-CM | POA: Diagnosis not present

## 2022-02-25 DIAGNOSIS — Z21 Asymptomatic human immunodeficiency virus [HIV] infection status: Secondary | ICD-10-CM | POA: Insufficient documentation

## 2022-02-25 DIAGNOSIS — Z7901 Long term (current) use of anticoagulants: Secondary | ICD-10-CM | POA: Insufficient documentation

## 2022-02-25 DIAGNOSIS — R0789 Other chest pain: Secondary | ICD-10-CM | POA: Diagnosis present

## 2022-02-25 DIAGNOSIS — R110 Nausea: Secondary | ICD-10-CM | POA: Insufficient documentation

## 2022-02-25 DIAGNOSIS — B20 Human immunodeficiency virus [HIV] disease: Secondary | ICD-10-CM

## 2022-02-25 DIAGNOSIS — R030 Elevated blood-pressure reading, without diagnosis of hypertension: Secondary | ICD-10-CM | POA: Diagnosis not present

## 2022-02-25 LAB — BASIC METABOLIC PANEL
Anion gap: 9 (ref 5–15)
BUN: 15 mg/dL (ref 6–20)
CO2: 24 mmol/L (ref 22–32)
Calcium: 9.9 mg/dL (ref 8.9–10.3)
Chloride: 109 mmol/L (ref 98–111)
Creatinine, Ser: 1.49 mg/dL — ABNORMAL HIGH (ref 0.44–1.00)
GFR, Estimated: 41 mL/min — ABNORMAL LOW (ref >60)
Glucose, Bld: 109 mg/dL — ABNORMAL HIGH (ref 70–99)
Potassium: 3.9 mmol/L (ref 3.5–5.1)
Sodium: 142 mmol/L (ref 135–145)

## 2022-02-25 LAB — I-STAT BETA HCG BLOOD, ED (MC, WL, AP ONLY): I-stat hCG, quantitative: <5 m[IU]/mL (ref <5)

## 2022-02-25 LAB — TROPONIN I (HIGH SENSITIVITY): Troponin I (High Sensitivity): <2 ng/L (ref <18)

## 2022-02-25 NOTE — ED Notes (Signed)
Pt called 4x no answer  

## 2022-02-25 NOTE — Progress Notes (Unsigned)
[facility] for Infectious Disease   CHIEF COMPLAINT    HIV follow up.    SUBJECTIVE:    Tracy [name] is a 56 y.o. female with PMHx as below who presents to the clinic for HIV follow up.   Please see A&P for the details of today's visit and status of the patient's medical problems.   Patient's Medications  New Prescriptions   No medications on file  Previous Medications   BUPROPION (WELLBUTRIN XL) 150 MG 24 HR TABLET    Take 150 mg by mouth every morning.   CALCIUM CARBONATE (OSCAL) 1500 (600 CA) MG TABS TABLET    Take 600 mg of elemental calcium by mouth daily with breakfast.   DOXEPIN (SINEQUAN) 25 MG CAPSULE    Take 25 mg by mouth at bedtime.   EMTRICITABINE-RILPIVIR-TENOFOVIR DF (COMPLERA) 200-25-300 MG TABLET    Take 1 tablet by mouth daily.   FEXOFENADINE (ALLEGRA) 180 MG TABLET    Take 180 mg by mouth daily.   GABAPENTIN (NEURONTIN) 300 MG CAPSULE    Take 1 capsule (300 mg total) by mouth 3 (three) times daily as needed for up to 14 days.   ONDANSETRON (ZOFRAN) 4 MG TABLET    Take 1 tablet (4 mg total) by mouth every 8 (eight) hours as needed for nausea or vomiting.   QUETIAPINE (SEROQUEL XR) 300 MG 24 HR TABLET    Take 300 mg by mouth at bedtime.   RIVAROXABAN (XARELTO) 20 MG TABS TABLET    Take 20 mg by mouth at bedtime.  Modified Medications   No medications on file  Discontinued Medications   No medications on file      Past Medical History:  Diagnosis Date   Arthritis    Chronic back pain    Chronic headaches    Depression    HIV infection (HCC)    Joint pain    Localized swelling of both lower legs    Legs, feet and hands   Pre-diabetes    Pulmonary embolism (HCC)    on Xarelto   Schizoaffective disorder (HCC)    Seasonal allergies    Stroke (cerebrum) ([place])    patient denies, states it was a heat stroke   Weight loss     Social History   Tobacco Use   Smoking status: Former    Packs/day: 0.30    Types: Cigarettes    Quit date:  04/26/2016    Years since quitting: 5.8   Smokeless tobacco: Never  Vaping Use   Vaping Use: Former  Substance Use Topics   Alcohol use: No    Alcohol/week: 0.0 standard drinks of alcohol    Comment: quit 12/2005   Drug use: Yes    Types: "Crack" cocaine    Comment: Previous drug use, quit 2004-05; relapsed 05/2018, clean since 06/08/2018    Family History  Problem Relation Age of Onset   Diabetes Mother    Hypertension Mother    Tremor Mother    Breast cancer Maternal Aunt    High blood pressure Sister    High blood pressure Maternal Uncle    Diabetes Maternal Uncle     Allergies  Allergen Reactions   Pork-Derived Products Other (See Comments)    Religious practice   Flexeril [Cyclobenzaprine] Rash   Moxifloxacin Rash   Tetracyclines & Related Itching   Tramadol Itching    ROS   OBJECTIVE:    There were no vitals filed for  this visit.   There is no height or weight on file to calculate BMI.  Physical Exam  Labs and Microbiology:    Latest Ref Rng & Units 02/16/2022   11:45 AM 12/24/2021    4:28 PM 12/19/2021    8:51 PM  CMP  Glucose 65 - 99 mg/dL 91  92  133   BUN 7 - 25 mg/dL '15  14  13   ' Creatinine 0.50 - 1.03 mg/dL 1.29  1.34  1.32   Sodium 135 - 146 mmol/L 142  140  142   Potassium 3.5 - 5.3 mmol/L 4.4  3.8  3.7   Chloride 98 - 110 mmol/L 108  108  111   CO2 20 - 32 mmol/L '27  22  22   ' Calcium 8.6 - 10.4 mg/dL 10.0  10.2  9.6   Total Protein 6.1 - 8.1 g/dL 7.1     Total Bilirubin 0.2 - 1.2 mg/dL 0.4     AST 10 - 35 U/L 14     ALT 6 - 29 U/L 12         Latest Ref Rng & Units 02/16/2022   11:45 AM 12/24/2021    4:28 PM 12/19/2021    8:51 PM  CBC  WBC 3.8 - 10.8 Thousand/uL 4.1  5.8  6.7   Hemoglobin 11.7 - 15.5 g/dL 13.7  14.0  13.0   Hematocrit 35.0 - 45.0 % 39.7  42.8  39.4   Platelets 140 - 400 Thousand/uL 261  289  290      Lab Results  Component Value Date   HIV1RNAQUANT Not Detected 02/16/2022   HIV1RNAQUANT Not Detected 07/28/2021    HIV1RNAQUANT 49 (H) 03/02/2018   CD4TABS 1,092 02/16/2022   CD4TABS 1,357 07/28/2021   CD4TABS 1,120 03/02/2018    RPR and STI: Lab Results  Component Value Date   LABRPR NON-REACTIVE 02/16/2022   LABRPR NON-REACTIVE 07/28/2021   LABRPR NON-REACTIVE 07/28/2017   LABRPR NON-REACTIVE 04/07/2017   LABRPR NON REAC 09/19/2015    STI Results GC CT  07/28/2021  3:47 PM Negative  Negative   09/19/2015 12:00 AM Negative  Negative     Hepatitis B: Lab Results  Component Value Date   HEPBSAB NON-REACTIVE 08/12/2021   HEPBSAG NON-REACTIVE 08/12/2021   HEPBCAB NON-REACTIVE 08/12/2021   Hepatitis C: Lab Results  Component Value Date   HEPCAB NON-REACTIVE 08/12/2021   Hepatitis A: Lab Results  Component Value Date   HAV REACTIVE (A) 08/12/2021   Lipids: Lab Results  Component Value Date   CHOL 188 06/21/2018   TRIG 137 06/21/2018   HDL 76 06/21/2018   CHOLHDL 2.5 06/21/2018   VLDL 27 06/21/2018   LDLCALC 85 06/21/2018    Imaging: ***   ASSESSMENT & PLAN:    No problem-specific Assessment & Plan notes found for this encounter.   No orders of the defined types were placed in this encounter.      *** Vaccines Influenza: give every year COVID: recommend vaccination if not already done Prevnar 20: Give x 1 if no prior pneumonia vaccine.    - If only 1 dose of either PPSV 23 OR PCV 13 ----> give PCV 20 if > 1 year since last vaccine  - If received both PPSV 23 AND PCV 13 ----> give PCV 20 if > 5 years since last vaccine.  If < 5 years then wait to give PCV 20 Pneumovax-23: (if CD4 >200) give twice every 5 years apart before age  41, then once at age 72.  Give >8 weeks from [place] Prevnar-13: (preferably when CD4 >200) give once, give >1 year from last Pneumovax-23 Hepatitis A: give Havrix 2 dose series at 0 and 6-12 months if non-immune Hepatitis B: give Heplisav 2 dose series at 0 and 4 weeks if non-immune.  Repeat serology 2 months after vaccine and revaccinate  if needed MenACWY: 2 dose primary series 8 weeks apart, then 1 dose booster every 5 years HPV: Gardasil-9 at 0, 2, and 6 months for ages 9-26 should be vaccinated.  Ages 63-45 should be offered if appropriate Tdap: give every 10 years Shingles: give Shingrix 2 dose series at 0 and 2-6 months if >50 years on ART with CD4 cell count >200 Varicella: primary vaccination may be considered in VZV seronegative persons aged >8 years (if CD4 >200)  MMR: vaccine should be given if born in 4 or after and do not have immunity (if CD4 >200)  Screening DEXA Scan: if age >38 Quantiferon: check at initiation of care Hepatitis C: check at initiation of care.  Screen annually if risk factors HLA [id number]: check at initiation of care G6PD: check if starting therapy with oxidant drugs Lipids: check annually Urinalysis: check annually or every 6 months if on tenofovir Hgb A1c: check annually  ASCVD Risk Score Consider high-intensity statin therapy if 10-year ASCVD risk score >7.5% The ASCVD Risk score (Arnett DK, et al., 2019) failed to calculate for the following reasons:   Cannot find a previous HDL lab   Cannot find a previous total cholesterol lab   [facility] 02/25/2022, 8:55 AM  HIV: Here for routine visit.  Last seen in January 2023 and in the interim had back surgery.  Her husband was also admitted to the hospital earlier this year.  She continues on stable regimen of Tivicay and Complera.  Her labs on 02/16/22 were stable with undetectable viral load and CD4 count 1092.  Will continue current regimen and follow up in 6 months.   Need for PCP PPx: ***  Vaccines: ***  STI/Screening: ***  Encounter for medication monitoring: ***

## 2022-02-25 NOTE — Progress Notes (Unsigned)
Patient transported to [facility] emergency department via wheelchair for chest pain. Checked patient in and took to triage waiting area. Husband present. Patient was in no acute distress when RN left.   Sandie Ano, RN

## 2022-02-25 NOTE — ED Triage Notes (Signed)
Pt presents with CP and left arm tingling x 2 days.  Pt states this has waxed and waned.  Does not radiate, stays central chest, described as sharp.  No n/v/d or shob.

## 2022-02-25 NOTE — ED Provider Triage Note (Signed)
Emergency Medicine Provider Triage Evaluation Note  Tracy [name]  was evaluated in triage.  Pt complains of sharp, left-sided chest pain for 3 days.  Patient reports that it is worse with exertion.  She reports that the pain comes and goes.  She endorses some nausea, left arm tingling and numbness.  She endorses history of high blood pressure, previous blood clots.  She is currently anticoagulated on Xarelto.  She additionally has a history of HIV, schizoaffective disorder.  She endorses tobacco use.  Review of Systems  Positive: Chest pain, shortness of breath, nausea Negative: Vomiting, fever, chills  Physical Exam  BP 133/87   Pulse (!) 105   Temp 98 F (36.7 C) (Oral)   Resp 18   SpO2 99%  Gen:   Awake, no distress   Resp:  Normal effort  MSK:   Moves extremities without difficulty Other:    Medical Decision Making  Medically screening exam initiated at 4:47 PM.  Appropriate orders placed.  Tracy [name] was informed that the remainder of the evaluation will be completed by another provider, this initial triage assessment does not replace that evaluation, and the importance of remaining in the ED until their evaluation is complete.  Workup initiated   Tracy [name], [place] 02/25/22 1647

## 2022-04-17 ENCOUNTER — Other Ambulatory Visit: Payer: Self-pay | Admitting: Physician Assistant

## 2022-04-17 DIAGNOSIS — R928 Other abnormal and inconclusive findings on diagnostic imaging of breast: Secondary | ICD-10-CM

## 2022-04-22 ENCOUNTER — Other Ambulatory Visit: Payer: Self-pay | Admitting: Physician Assistant

## 2022-04-22 DIAGNOSIS — R928 Other abnormal and inconclusive findings on diagnostic imaging of breast: Secondary | ICD-10-CM

## 2022-05-05 ENCOUNTER — Other Ambulatory Visit: Payer: Self-pay

## 2022-05-05 DIAGNOSIS — B20 Human immunodeficiency virus [HIV] disease: Secondary | ICD-10-CM

## 2022-05-05 MED ORDER — TIVICAY 50 MG PO TABS: 50.0000 mg | ORAL_TABLET | Freq: Every day | ORAL | 0 refills | Status: DC

## 2022-05-05 MED ORDER — COMPLERA 200-25-300 MG PO TABS: 1.0000 | ORAL_TABLET | Freq: Every day | ORAL | 0 refills | Status: DC

## 2022-05-22 ENCOUNTER — Other Ambulatory Visit (HOSPITAL_COMMUNITY)
Admission: RE | Admit: 2022-05-22 | Discharge: 2022-05-22 | Disposition: A | Discharge status: Home / Self Care | Payer: Medicaid Other | Source: Ambulatory Visit | Attending: Internal Medicine | Admitting: Internal Medicine

## 2022-05-22 ENCOUNTER — Other Ambulatory Visit: Payer: Self-pay

## 2022-05-22 ENCOUNTER — Other Ambulatory Visit: Payer: Medicaid Other

## 2022-05-22 DIAGNOSIS — Z79899 Other long term (current) drug therapy: Secondary | ICD-10-CM | POA: Insufficient documentation

## 2022-05-22 DIAGNOSIS — B20 Human immunodeficiency virus [HIV] disease: Secondary | ICD-10-CM

## 2022-05-22 DIAGNOSIS — Z113 Encounter for screening for infections with a predominantly sexual mode of transmission: Secondary | ICD-10-CM | POA: Diagnosis present

## 2022-05-25 ENCOUNTER — Ambulatory Visit: Payer: Medicaid Other

## 2022-05-25 LAB — LIPID PANEL
Cholesterol: 209 mg/dL — ABNORMAL HIGH (ref <200)
HDL: 72 mg/dL (ref >=50)
LDL Cholesterol (Calc): 117 mg/dL (calc) — ABNORMAL HIGH
Non-HDL Cholesterol (Calc): 137 mg/dL (calc) — ABNORMAL HIGH (ref <130)
Total CHOL/HDL Ratio: 2.9 (calc) (ref <5.0)
Triglycerides: 92 mg/dL (ref <150)

## 2022-05-25 LAB — URINE CYTOLOGY ANCILLARY ONLY
Chlamydia: NEGATIVE
Comment: NEGATIVE
Comment: NORMAL
Neisseria Gonorrhea: NEGATIVE

## 2022-05-25 LAB — CBC WITH DIFFERENTIAL/PLATELET
Absolute Monocytes: 331 cells/uL (ref 200–950)
Basophils Absolute: 30 cells/uL (ref 0–200)
Basophils Relative: 0.7 %
Eosinophils Absolute: 60 cells/uL (ref 15–500)
Eosinophils Relative: 1.4 %
HCT: 38.6 % (ref 35.0–45.0)
Hemoglobin: 13.3 g/dL (ref 11.7–15.5)
Lymphs Abs: 1875 cells/uL (ref 850–3900)
MCH: 31.5 pg (ref 27.0–33.0)
MCHC: 34.5 g/dL (ref 32.0–36.0)
MCV: 91.5 fL (ref 80.0–100.0)
MPV: 10.7 fL (ref 7.5–12.5)
Monocytes Relative: 7.7 %
Neutro Abs: 2004 cells/uL (ref 1500–7800)
Neutrophils Relative %: 46.6 %
Platelets: 249 10*3/uL (ref 140–400)
RBC: 4.22 10*6/uL (ref 3.80–5.10)
RDW: 13 % (ref 11.0–15.0)
Total Lymphocyte: 43.6 %
WBC: 4.3 10*3/uL (ref 3.8–10.8)

## 2022-05-25 LAB — T-HELPER CELLS (CD4) COUNT (NOT AT ARMC)
Absolute CD4: 1062 cells/uL (ref 490–1740)
CD4 T Helper %: 49 % (ref 30–61)
Total lymphocyte count: 2176 cells/uL (ref 850–3900)

## 2022-05-25 LAB — COMPLETE METABOLIC PANEL WITH GFR
AG Ratio: 1.8 (calc) (ref 1.0–2.5)
ALT: 12 U/L (ref 6–29)
AST: 14 U/L (ref 10–35)
Albumin: 4.6 g/dL (ref 3.6–5.1)
Alkaline phosphatase (APISO): 91 U/L (ref 37–153)
BUN/Creatinine Ratio: 17 (calc) (ref 6–22)
BUN: 22 mg/dL (ref 7–25)
CO2: 26 mmol/L (ref 20–32)
Calcium: 9.7 mg/dL (ref 8.6–10.4)
Chloride: 106 mmol/L (ref 98–110)
Creat: 1.32 mg/dL — ABNORMAL HIGH (ref 0.50–1.03)
Globulin: 2.5 g/dL (calc) (ref 1.9–3.7)
Glucose, Bld: 91 mg/dL (ref 65–99)
Potassium: 4.4 mmol/L (ref 3.5–5.3)
Sodium: 140 mmol/L (ref 135–146)
Total Bilirubin: 0.6 mg/dL (ref 0.2–1.2)
Total Protein: 7.1 g/dL (ref 6.1–8.1)
eGFR: 48 mL/min/{1.73_m2} — ABNORMAL LOW (ref >=60)

## 2022-05-25 LAB — HIV-1 RNA QUANT-NO REFLEX-BLD
HIV 1 RNA Quant: 34 Copies/mL — ABNORMAL HIGH
HIV-1 RNA Quant, Log: 1.53 Log cps/mL — ABNORMAL HIGH

## 2022-05-25 LAB — RPR: RPR Ser Ql: NONREACTIVE

## 2022-06-02 ENCOUNTER — Other Ambulatory Visit: Payer: Self-pay | Admitting: Internal Medicine

## 2022-06-02 DIAGNOSIS — B20 Human immunodeficiency virus [HIV] disease: Secondary | ICD-10-CM

## 2022-06-11 ENCOUNTER — Ambulatory Visit: Payer: Medicaid Other | Admitting: Internal Medicine

## 2022-06-11 NOTE — Progress Notes (Deleted)
[facility]   CHIEF COMPLAINT    HIV follow up.    SUBJECTIVE:    Tracy [name] is a 56 y.o. female with PMHx as below who presents to the clinic for HIV follow up.   Please see A&P for the details of today's visit and status of the patient's medical problems.   Patient's Medications  New Prescriptions   No medications on file  Previous Medications   AMLODIPINE (NORVASC) 5 MG TABLET    Take 5 mg by mouth daily.   BUPROPION (WELLBUTRIN XL) 150 MG 24 HR TABLET    Take 150 mg by mouth every morning.   CALCIUM CARBONATE (OSCAL) 1500 (600 CA) MG TABS TABLET    Take 600 mg of elemental calcium by mouth daily with breakfast.   DOLUTEGRAVIR (TIVICAY) 50 MG TABLET    TAKE 1 Tablet BY MOUTH ONCE DAILY   DOXEPIN (SINEQUAN) 25 MG CAPSULE    Take 25 mg by mouth at bedtime.   EMTRICITABINE-RILPIVIR-TENOFOVIR DF (COMPLERA) 200-25-300 MG TABLET    TAKE 1 Tablet BY MOUTH ONCE DAILY   FEXOFENADINE (ALLEGRA) 180 MG TABLET    Take 180 mg by mouth daily.   GABAPENTIN (NEURONTIN) 300 MG CAPSULE    Take 1 capsule (300 mg total) by mouth 3 (three) times daily as needed for up to 14 days.   ONDANSETRON (ZOFRAN) 4 MG TABLET    Take 1 tablet (4 mg total) by mouth every 8 (eight) hours as needed for nausea or vomiting.   QUETIAPINE (SEROQUEL XR) 300 MG 24 HR TABLET    Take 300 mg by mouth at bedtime.   RIVAROXABAN (XARELTO) 20 MG TABS TABLET    Take 20 mg by mouth at bedtime.  Modified Medications   No medications on file  Discontinued Medications   No medications on file      Past Medical History:  Diagnosis Date   Arthritis    Chronic back pain    Chronic headaches    Depression    HIV infection (HCC)    Joint pain    Localized swelling of both lower legs    Legs, feet and hands   Pre-diabetes    Pulmonary embolism (HCC)    on Xarelto   Schizoaffective disorder (HCC)    Seasonal allergies    Stroke (cerebrum) ([place])    patient denies, states it was a  heat stroke   Weight loss     Social History   Tobacco Use   Smoking status: Former    Packs/day: 0.30    Types: Cigarettes    Quit date: 04/26/2016    Years since quitting: 6.1   Smokeless tobacco: Never   Tobacco comments:    1 pack of cigarettes will last a month  Vaping Use   Vaping Use: Former  Substance Use Topics   Alcohol use: No    Alcohol/week: 0.0 standard drinks of alcohol    Comment: quit 12/2005   Drug use: Yes    Types: "Crack" cocaine    Comment: Previous drug use, quit 2004-05; relapsed 05/2018, clean since 06/08/2018    Family History  Problem Relation Age of Onset   Diabetes Mother    Hypertension Mother    Tremor Mother    Breast cancer Maternal Aunt    High blood pressure Sister    High blood pressure Maternal Uncle    Diabetes Maternal Uncle     Allergies  Allergen Reactions   Pork-Derived Products Other (See Comments)    Religious practice   Flexeril [Cyclobenzaprine] Rash   Moxifloxacin Rash   Tetracyclines & Related Itching   Tramadol Itching    ROS   OBJECTIVE:    There were no vitals filed for this visit.   There is no height or weight on file to calculate BMI.  Physical Exam  Labs and Microbiology:    Latest Ref Rng & Units 05/22/2022    8:25 AM 02/25/2022    4:41 PM 02/16/2022   11:45 AM  CMP  Glucose 65 - 99 mg/dL 91  109  91   BUN 7 - 25 mg/dL _0 Creatinine 0.50 - 1.03 mg/dL 1.32  1.49  1.29   Sodium 135 - 146 mmol/L 140  142  142   Potassium 3.5 - 5.3 mmol/L 4.4  3.9  4.4   Chloride 98 - 110 mmol/L 106  109  108   CO2 20 - 32 mmol/L _1 Calcium 8.6 - 10.4 mg/dL 9.7  9.9  10.0   Total Protein 6.1 - 8.1 g/dL 7.1   7.1   Total Bilirubin 0.2 - 1.2 mg/dL 0.6   0.4   AST 10 - 35 U/L 14   14   ALT 6 - 29 U/L 12   12       Latest Ref Rng & Units 05/22/2022    8:25 AM 02/16/2022   11:45 AM 12/24/2021    4:28 PM  CBC  WBC 3.8 - 10.8 Thousand/uL 4.3  4.1  5.8   Hemoglobin 11.7 - 15.5 g/dL 13.3  13.7   14.0   Hematocrit 35.0 - 45.0 % 38.6  39.7  42.8   Platelets 140 - 400 Thousand/uL 249  261  289      Lab Results  Component Value Date   HIV1RNAQUANT 34 (H) 05/22/2022   HIV1RNAQUANT Not Detected 02/16/2022   HIV1RNAQUANT Not Detected 07/28/2021   CD4TABS 1,092 02/16/2022   CD4TABS 1,357 07/28/2021   CD4TABS 1,120 03/02/2018    RPR and STI: Lab Results  Component Value Date   LABRPR NON-REACTIVE 05/22/2022   LABRPR NON-REACTIVE 02/16/2022   LABRPR NON-REACTIVE 07/28/2021   LABRPR NON-REACTIVE 07/28/2017   LABRPR NON-REACTIVE 04/07/2017    STI Results GC CT  05/22/2022  8:43 AM Negative  Negative   07/28/2021  3:47 PM Negative  Negative   09/19/2015 12:00 AM Negative  Negative     Hepatitis B: Lab Results  Component Value Date   HEPBSAB NON-REACTIVE 08/12/2021   HEPBSAG NON-REACTIVE 08/12/2021   HEPBCAB NON-REACTIVE 08/12/2021   Hepatitis C: Lab Results  Component Value Date   HEPCAB NON-REACTIVE 08/12/2021   Hepatitis A: Lab Results  Component Value Date   HAV REACTIVE (A) 08/12/2021   Lipids: Lab Results  Component Value Date   CHOL 209 (H) 05/22/2022   TRIG 92 05/22/2022   HDL 72 05/22/2022   CHOLHDL 2.9 05/22/2022   VLDL 27 06/21/2018   LDLCALC 117 (H) 05/22/2022    Imaging:    ASSESSMENT & PLAN:    No problem-specific Assessment & Plan notes found for this encounter.   No orders of the defined types were placed in this encounter.      *** Vaccines Influenza: give every year COVID: recommend vaccination if not already done Prevnar 20: Give x 1 if no prior pneumonia vaccine.    - If only 1  dose of either PPSV 23 OR PCV 13 ----> give PCV 20 if > 1 year since last vaccine  - If received both PPSV 23 AND PCV 13 ----> give PCV 20 if > 5 years since last vaccine.  If < 5 years then wait to give PCV 20 Pneumovax-23: (if CD4 >200) give twice every 5 years apart before age 31, then once at age 79.  Give >8 weeks from [place] Prevnar-13:  (preferably when CD4 >200) give once, give >1 year from last Pneumovax-23 Hepatitis A: give Havrix 2 dose series at 0 and 6-12 months if non-immune Hepatitis B: give Heplisav 2 dose series at 0 and 4 weeks if non-immune.  Repeat serology 2 months after vaccine and revaccinate if needed MenACWY: 2 dose primary series 8 weeks apart, then 1 dose booster every 5 years HPV: Gardasil-9 at 0, 2, and 6 months for ages 9-26 should be vaccinated.  Ages 41-45 should be offered if appropriate Tdap: give every 10 years Shingles: give Shingrix 2 dose series at 0 and 2-6 months if >50 years on ART with CD4 cell count >200 Varicella: primary vaccination may be considered in VZV seronegative persons aged >8 years (if CD4 >200)  MMR: vaccine should be given if born in 40 or after and do not have immunity (if CD4 >200)  Screening DEXA Scan: if age >26 Quantiferon: check at initiation of care Hepatitis C: check at initiation of care.  Screen annually if risk factors HLA B5701: check at initiation of care G6PD: check if starting therapy with oxidant drugs Lipids: check annually Urinalysis: check annually or every 6 months if on tenofovir Hgb A1c: check annually  ASCVD Risk Score Consider high-intensity statin therapy if 10-year ASCVD risk score >7.5% The 10-year ASCVD risk score (Arnett DK, et al., 2019) is: 2.1%   Tracy [name] for Infectious Disease [facility] 06/11/2022, 12:54 PM  HIV: She is here today for routine HIV follow up and currently is on Complera and Tivicay.  Her most recent labs from 05/22/22 show a minimal viral blip of 34 copies and CD4 count of 1062.  She has no issues tolerating her medication at this time and her other monitoring labs were stable.   Need for PCP PPx: ***  Vaccines: ***  STI/Screening: ***  Encounter for medication monitoring: ***

## 2022-06-15 ENCOUNTER — Other Ambulatory Visit: Payer: Self-pay

## 2022-06-15 ENCOUNTER — Ambulatory Visit (INDEPENDENT_AMBULATORY_CARE_PROVIDER_SITE_OTHER): Payer: Medicaid Other | Admitting: Internal Medicine

## 2022-06-15 ENCOUNTER — Encounter: Payer: Self-pay | Admitting: Internal Medicine

## 2022-06-15 VITALS — BP 141/94 | HR 86 | Temp 98.2°F | Ht 68.0 in | Wt 205.0 lb

## 2022-06-15 DIAGNOSIS — Z7185 Encounter for immunization safety counseling: Secondary | ICD-10-CM

## 2022-06-15 DIAGNOSIS — Z23 Encounter for immunization: Secondary | ICD-10-CM | POA: Diagnosis not present

## 2022-06-15 DIAGNOSIS — B20 Human immunodeficiency virus [HIV] disease: Secondary | ICD-10-CM | POA: Diagnosis not present

## 2022-06-15 MED ORDER — COMPLERA 200-25-300 MG PO TABS: 1.0000 | ORAL_TABLET | Freq: Every day | ORAL | 3 refills | Status: DC

## 2022-06-15 MED ORDER — TIVICAY 50 MG PO TABS: 50.0000 mg | ORAL_TABLET | Freq: Every day | ORAL | 3 refills | Status: DC

## 2022-06-15 NOTE — Assessment & Plan Note (Signed)
Flu shot given today.  She also inquired whether she should receive the Shingrix vaccine and I recommended that she do so at her local pharmacy.

## 2022-06-15 NOTE — Assessment & Plan Note (Signed)
She is here today for routine HIV follow up and currently is on Complera and Tivicay.  Her most recent labs from 05/22/22 show a minimal viral blip of 34 copies and CD4 count of 1062.  She has no issues tolerating her medication at this time and her other monitoring labs were stable.  Refills sent today and follow up in 6 months.

## 2022-06-15 NOTE — Progress Notes (Signed)
[facility]   CHIEF COMPLAINT    HIV follow up.    SUBJECTIVE:    Tracy [name] is a 55 y.o. female with PMHx as below who presents to the clinic for HIV follow up.   Please see A&P for the details of today's visit and status of the patient's medical problems.   Patient's Medications  New Prescriptions   No medications on file  Previous Medications   AMLODIPINE (NORVASC) 5 MG TABLET    Take 5 mg by mouth daily.   BUPROPION (WELLBUTRIN XL) 150 MG 24 HR TABLET    Take 150 mg by mouth every morning.   DOXEPIN (SINEQUAN) 25 MG CAPSULE    Take 25 mg by mouth at bedtime.   GABAPENTIN (NEURONTIN) 300 MG CAPSULE    Take 1 capsule (300 mg total) by mouth 3 (three) times daily as needed for up to 14 days.   QUETIAPINE (SEROQUEL XR) 300 MG 24 HR TABLET    Take 300 mg by mouth at bedtime.   RIVAROXABAN (XARELTO) 20 MG TABS TABLET    Take 20 mg by mouth at bedtime.  Modified Medications   Modified Medication Previous Medication   DOLUTEGRAVIR (TIVICAY) 50 MG TABLET dolutegravir (TIVICAY) 50 MG tablet      Take 1 tablet (50 mg total) by mouth daily.    TAKE 1 Tablet BY MOUTH ONCE DAILY   EMTRICITABINE-RILPIVIR-TENOFOVIR DF (COMPLERA) 200-25-300 MG TABLET emtricitabine-rilpivir-tenofovir DF (COMPLERA) 200-25-300 MG tablet      Take 1 tablet by mouth daily.    TAKE 1 Tablet BY MOUTH ONCE DAILY  Discontinued Medications   CALCIUM CARBONATE (OSCAL) 1500 (600 CA) MG TABS TABLET    Take 600 mg of elemental calcium by mouth daily with breakfast.   FEXOFENADINE (ALLEGRA) 180 MG TABLET    Take 180 mg by mouth daily.   ONDANSETRON (ZOFRAN) 4 MG TABLET    Take 1 tablet (4 mg total) by mouth every 8 (eight) hours as needed for nausea or vomiting.      Past Medical History:  Diagnosis Date   Arthritis    Chronic back pain    Chronic headaches    Depression    HIV infection (HCC)    Joint pain    Localized swelling of both lower legs    Legs, feet and hands    Pre-diabetes    Pulmonary embolism (HCC)    on Xarelto   Schizoaffective disorder (HCC)    Seasonal allergies    Stroke (cerebrum) (HCC)    patient denies, states it was a heat stroke   Weight loss     Social History   Tobacco Use   Smoking status: Former    Packs/day: 0.30    Types: Cigarettes    Quit date: 04/26/2016    Years since quitting: 6.1   Smokeless tobacco: Never   Tobacco comments:    1 pack of cigarettes will last a month  Vaping Use   Vaping Use: Former  Substance Use Topics   Alcohol use: No    Alcohol/week: 0.0 standard drinks of alcohol    Comment: quit 12/2005   Drug use: Yes    Types: "Crack" cocaine    Comment: Previous drug use, quit 2004-05; relapsed 05/2018, clean since 06/08/2018    Family History  Problem Relation Age of Onset   Diabetes Mother    Hypertension Mother    Tremor Mother    Breast  cancer Maternal Aunt    High blood pressure Sister    High blood pressure Maternal Uncle    Diabetes Maternal Uncle     Allergies  Allergen Reactions   Pork-Derived Products Other (See Comments)    Religious practice   Flexeril [Cyclobenzaprine] Rash   Moxifloxacin Rash   Tetracyclines & Related Itching   Tramadol Itching    Review of Systems  Constitutional: Negative.   Respiratory: Negative.    Cardiovascular: Negative.   Gastrointestinal: Negative.      OBJECTIVE:    Vitals:   06/15/22 0834  BP: (!) 141/94  Pulse: 86  Temp: 98.2 F (36.8 C)  TempSrc: Oral  SpO2: 100%  Weight: 205 lb (93 kg)  Height: 5\' 8"  (1.727 m)     Body mass index is 31.17 kg/m.  Physical Exam Constitutional:      Appearance: Normal appearance.  HENT:     Head: Normocephalic and atraumatic.  Eyes:     Extraocular Movements: Extraocular movements intact.     Conjunctiva/sclera: Conjunctivae normal.  Pulmonary:     Effort: Pulmonary effort is normal. No respiratory distress.  Abdominal:     General: There is no distension.     Palpations:  Abdomen is soft.  Musculoskeletal:     Cervical back: Normal range of motion and neck supple.  Skin:    General: Skin is warm and dry.  Neurological:     General: No focal deficit present.     Mental Status: She is alert and oriented to person, place, and time.  Psychiatric:        Mood and Affect: Mood normal.        Behavior: Behavior normal.     Labs and Microbiology:    Latest Ref Rng & Units 05/22/2022    8:25 AM 02/25/2022    4:41 PM 02/16/2022   11:45 AM  CMP  Glucose 65 - 99 mg/dL 91  04/18/2022  91   BUN 7 - 25 mg/dL 22  15  15    Creatinine 0.50 - 1.03 mg/dL 185   6.31   Sodium 135 - 146 mmol/L 140  142  142   Potassium 3.5 - 5.3 mmol/L 4.4  3.9  4.4   Chloride 98 - 110 mmol/L 106  109  108   CO2 20 - 32 mmol/L 26  24  27    Calcium 8.6 - 10.4 mg/dL 9.7  9.9  4.97   Total Protein 6.1 - 8.1 g/dL 7.1   7.1   Total Bilirubin 0.2 - 1.2 mg/dL 0.6   0.4   AST 10 - 35 U/L 14   14   ALT 6 - 29 U/L 12   12       Latest Ref Rng & Units 05/22/2022    8:25 AM 02/16/2022   11:45 AM 12/24/2021    4:28 PM  CBC  WBC 3.8 - 10.8 Thousand/uL 4.3  4.1  5.8   Hemoglobin 11.7 - 15.5 g/dL 13/04/2022  04/18/2022  12/26/2021   Hematocrit 35.0 - 45.0 % 38.6  39.7  42.8   Platelets 140 - 400 Thousand/uL 249  261  289      Lab Results  Component Value Date   HIV1RNAQUANT 34 (H) 05/22/2022   HIV1RNAQUANT Not Detected 02/16/2022   HIV1RNAQUANT Not Detected 07/28/2021   CD4TABS 1,092 02/16/2022   CD4TABS 1,357 07/28/2021   CD4TABS 1,120 03/02/2018    RPR and STI: Lab Results  Component Value Date  LABRPR NON-REACTIVE 05/22/2022   LABRPR NON-REACTIVE 02/16/2022   LABRPR NON-REACTIVE 07/28/2021   LABRPR NON-REACTIVE 07/28/2017   LABRPR NON-REACTIVE 04/07/2017    STI Results GC CT  05/22/2022  8:43 AM Negative  Negative   07/28/2021  3:47 PM Negative  Negative   09/19/2015 12:00 AM Negative  Negative     Hepatitis B: Lab Results  Component Value Date   HEPBSAB NON-REACTIVE 08/12/2021    HEPBSAG NON-REACTIVE 08/12/2021   HEPBCAB NON-REACTIVE 08/12/2021   Hepatitis C: Lab Results  Component Value Date   HEPCAB NON-REACTIVE 08/12/2021   Hepatitis A: Lab Results  Component Value Date   HAV REACTIVE (A) 08/12/2021   Lipids: Lab Results  Component Value Date   CHOL 209 (H) 05/22/2022   TRIG 92 05/22/2022   HDL 72 05/22/2022   CHOLHDL 2.9 05/22/2022   VLDL 27 06/21/2018   LDLCALC 117 (H) 05/22/2022    Imaging:    ASSESSMENT & PLAN:    HIV disease (HCC) She is here today for routine HIV follow up and currently is on Complera and Tivicay.  Her most recent labs from 05/22/22 show a minimal viral blip of 34 copies and CD4 count of 1062.  She has no issues tolerating her medication at this time and her other monitoring labs were stable.  Refills sent today and follow up in 6 months.   Vaccine counseling Flu shot given today.  She also inquired whether she should receive the Shingrix vaccine and I recommended that she do so at her local pharmacy.     Vedia Coffer for Infectious Disease [facility] 06/15/2022, 8:48 AM

## 2022-07-21 ENCOUNTER — Ambulatory Visit: Payer: Medicaid Other

## 2022-09-08 ENCOUNTER — Ambulatory Visit: Payer: Medicaid Other

## 2022-12-09 ENCOUNTER — Other Ambulatory Visit: Payer: Self-pay

## 2022-12-09 ENCOUNTER — Emergency Department (HOSPITAL_COMMUNITY): Payer: Medicaid Other

## 2022-12-09 ENCOUNTER — Other Ambulatory Visit: Payer: Medicaid Other

## 2022-12-09 ENCOUNTER — Other Ambulatory Visit (HOSPITAL_COMMUNITY)
Admission: RE | Admit: 2022-12-09 | Discharge: 2022-12-09 | Disposition: A | Discharge status: Home / Self Care | Payer: Medicaid Other | Source: Ambulatory Visit | Attending: Internal Medicine | Admitting: Internal Medicine

## 2022-12-09 ENCOUNTER — Emergency Department (HOSPITAL_COMMUNITY)
Admission: EM | Admit: 2022-12-09 | Discharge: 2022-12-09 | Disposition: A | Discharge status: Home / Self Care | Payer: Medicaid Other | Attending: Emergency Medicine | Admitting: Emergency Medicine

## 2022-12-09 DIAGNOSIS — B20 Human immunodeficiency virus [HIV] disease: Secondary | ICD-10-CM

## 2022-12-09 DIAGNOSIS — R000 Tachycardia, unspecified: Secondary | ICD-10-CM | POA: Diagnosis not present

## 2022-12-09 DIAGNOSIS — Z79899 Other long term (current) drug therapy: Secondary | ICD-10-CM | POA: Insufficient documentation

## 2022-12-09 DIAGNOSIS — Z113 Encounter for screening for infections with a predominantly sexual mode of transmission: Secondary | ICD-10-CM | POA: Insufficient documentation

## 2022-12-09 DIAGNOSIS — Z21 Asymptomatic human immunodeficiency virus [HIV] infection status: Secondary | ICD-10-CM | POA: Diagnosis not present

## 2022-12-09 DIAGNOSIS — J189 Pneumonia, unspecified organism: Secondary | ICD-10-CM | POA: Diagnosis not present

## 2022-12-09 DIAGNOSIS — Z1152 Encounter for screening for COVID-19: Secondary | ICD-10-CM | POA: Insufficient documentation

## 2022-12-09 DIAGNOSIS — R0602 Shortness of breath: Secondary | ICD-10-CM | POA: Diagnosis present

## 2022-12-09 LAB — BASIC METABOLIC PANEL
Anion gap: 11 (ref 5–15)
BUN: 16 mg/dL (ref 6–20)
CO2: 27 mmol/L (ref 22–32)
Calcium: 10.2 mg/dL (ref 8.9–10.3)
Chloride: 102 mmol/L (ref 98–111)
Creatinine, Ser: 1.26 mg/dL — ABNORMAL HIGH (ref 0.44–1.00)
GFR, Estimated: 50 mL/min — ABNORMAL LOW (ref >60)
Glucose, Bld: 91 mg/dL (ref 70–99)
Potassium: 4.6 mmol/L (ref 3.5–5.1)
Sodium: 140 mmol/L (ref 135–145)

## 2022-12-09 LAB — CBC WITH DIFFERENTIAL/PLATELET
MCH: 31.4 pg (ref 27.0–33.0)
Monocytes Relative: 6.3 %
Platelets: 267 10*3/uL (ref 140–400)
RBC: 4.46 10*6/uL (ref 3.80–5.10)
WBC: 4.6 10*3/uL (ref 3.8–10.8)

## 2022-12-09 LAB — CBC
HCT: 42.2 % (ref 36.0–46.0)
Hemoglobin: 14 g/dL (ref 12.0–15.0)
MCH: 31.2 pg (ref 26.0–34.0)
MCHC: 33.2 g/dL (ref 30.0–36.0)
MCV: 94 fL (ref 80.0–100.0)
Platelets: 293 10*3/uL (ref 150–400)
RBC: 4.49 MIL/uL (ref 3.87–5.11)
RDW: 13.1 % (ref 11.5–15.5)
WBC: 5.9 10*3/uL (ref 4.0–10.5)
nRBC: 0 % (ref 0.0–0.2)

## 2022-12-09 LAB — SARS CORONAVIRUS 2 BY RT PCR: SARS Coronavirus 2 by RT PCR: NEGATIVE

## 2022-12-09 LAB — TROPONIN I (HIGH SENSITIVITY): Troponin I (High Sensitivity): <2 ng/L (ref <18)

## 2022-12-09 MED ORDER — SODIUM CHLORIDE 0.9 % IV BOLUS
1000.0000 mL | Freq: Once | INTRAVENOUS | Status: AC
  Administered 2022-12-09: 1000 mL via INTRAVENOUS

## 2022-12-09 MED ORDER — ASPIRIN 81 MG PO CHEW
324.0000 mg | CHEWABLE_TABLET | Freq: Once | ORAL | Status: AC
  Administered 2022-12-09: 324 mg via ORAL
  Filled 2022-12-09: qty 4

## 2022-12-09 MED ORDER — AZITHROMYCIN 250 MG PO TABS
500.0000 mg | ORAL_TABLET | Freq: Once | ORAL | Status: AC
  Administered 2022-12-09: 500 mg via ORAL
  Filled 2022-12-09: qty 2

## 2022-12-09 MED ORDER — AZITHROMYCIN 250 MG PO TABS: 250.0000 mg | ORAL_TABLET | Freq: Every day | ORAL | 0 refills | Status: DC

## 2022-12-09 MED ORDER — IOHEXOL 350 MG/ML SOLN
75.0000 mL | Freq: Once | INTRAVENOUS | Status: AC | PRN
  Administered 2022-12-09: 75 mL via INTRAVENOUS

## 2022-12-09 MED ORDER — HYDROMORPHONE HCL 1 MG/ML IJ SOLN
1.0000 mg | Freq: Once | INTRAMUSCULAR | Status: AC
  Administered 2022-12-09: 1 mg via INTRAVENOUS
  Filled 2022-12-09: qty 1

## 2022-12-09 NOTE — ED Triage Notes (Signed)
Pt. Stated, I've been SOb and chest pain for 3 days , today it seems worse and it hurts when I breath.

## 2022-12-09 NOTE — ED Provider Notes (Signed)
[place] [facility]ERGENCY DEPARTMENT AT [facility] Provider Note   CSN: [id number] Arrival date & time: 12/09/22  1034     History  Chief Complaint  Patient presents with   Shortness of Breath   Chest Pain    Tracy [name] is a 57 y.o. female.  HPI 57 year old female with a history of a HIV and PE a few years ago presents with chest pain.  She has been dealing with sharp mid chest pain for a couple days but then today is having pleuritic left-sided lateral chest pain.  This feels similar to when she had a PE on the right side.  She has been compliant with Xarelto.  She has had a cough with some green sputum for about a week but no shortness of breath.  She is currently breathing shallow breaths but states this because of the pain rather than dyspnea.  No fevers, abdominal pain, vomiting, or leg swelling.  She has taken Tylenol without relief.  Home Medications Prior to Admission medications   Medication Sig Start Date End Date Taking? Authorizing Provider  amLODipine (NORVASC) 5 MG tablet Take 5 mg by mouth daily. 01/30/22   [provider]  buPROPion (WELLBUTRIN XL) 150 MG 24 hr tablet Take 150 mg by mouth every morning. 07/31/21   [provider]  dolutegravir (TIVICAY) 50 MG tablet Take 1 tablet (50 mg total) by mouth daily. 06/15/22 06/10/23  Kathlynn Grate, DO  doxepin (SINEQUAN) 25 MG capsule Take 25 mg by mouth at bedtime.    [provider]  emtricitabine-rilpivir-tenofovir DF (COMPLERA) 200-25-300 MG tablet Take 1 tablet by mouth daily. 06/15/22 06/10/23  Kathlynn Grate, DO  gabapentin (NEURONTIN) 300 MG capsule Take 1 capsule (300 mg total) by mouth 3 (three) times daily as needed for up to 14 days. 10/04/21 02/25/22  Dion Saucier D, PA  QUEtiapine (SEROQUEL XR) 300 MG 24 hr tablet Take 300 mg by mouth at bedtime.    [provider]  rivaroxaban (XARELTO) 20 MG TABS tablet Take 20 mg by mouth at bedtime. 07/10/20   [provider]      Allergies    Pork-derived products, Flexeril [cyclobenzaprine], Moxifloxacin, Tetracyclines & related, and Tramadol    Review of Systems   Review of Systems  Constitutional:  Negative for fever.  Respiratory:  Positive for cough. Negative for shortness of breath.   Cardiovascular:  Positive for chest pain. Negative for leg swelling.  Gastrointestinal:  Negative for abdominal pain and vomiting.    Physical Exam Updated Vital Signs BP 120/86 (BP Location: Right Arm)   Pulse 75   Temp 98.5 F (36.9 C) (Oral)   Resp 19   Ht 5\' 7"  (1.702 m)   Wt 88 kg   SpO2 100%   BMI 30.38 kg/m  Physical Exam Vitals and nursing note reviewed.  Constitutional:      Appearance: She is well-developed. She is not ill-appearing or diaphoretic.  HENT:     Head: Normocephalic and atraumatic.  Cardiovascular:     Rate and Rhythm: Normal rate and regular rhythm.     Heart sounds: Normal heart sounds.  Pulmonary:     Effort: Pulmonary effort is normal. Tachypnea present.     Breath sounds: Normal breath sounds.     Comments: Patient is taking frequent but shallow breaths due to pain. I am able to get her to slow down her breathing consciously but it causes some pain Chest:     Chest  wall: Tenderness (patient is able to reproduce the pain by palpating on her own sternum) present.  Abdominal:     Palpations: Abdomen is soft.     Tenderness: There is no abdominal tenderness.  Musculoskeletal:     Right lower leg: No edema.     Left lower leg: No edema.  Skin:    General: Skin is warm and dry.  Neurological:     Mental Status: She is alert.     ED Results / Procedures / Treatments   Labs (all labs ordered are listed, but only abnormal results are displayed) Labs Reviewed  BASIC METABOLIC PANEL - Abnormal; Notable for the following components:      Result Value   Creatinine, Ser 1.26 (*)    GFR, Estimated 50 (*)    All other components within normal limits  SARS  CORONAVIRUS 2 BY RT PCR  CBC  TROPONIN I (HIGH SENSITIVITY)    EKG EKG Interpretation  Date/Time:  Wednesday Dec 09 2022 13:45:40 EDT Ventricular Rate:  69 PR Interval:  194 QRS Duration: 95 QT Interval:  406 QTC Calculation: 435 R Axis:   39 Text Interpretation: Sinus rhythm Low voltage, precordial leads Borderline T abnormalities, anterior leads similar to Aug 2023 Confirmed by Pricilla Loveless [phone]) on 12/09/2022 2:56:40 PM  Radiology DG Chest 2 View  Result Date: 12/09/2022 CLINICAL DATA:  Shortness of breath EXAM: CHEST - 2 VIEW COMPARISON:  X-ray 02/25/2022 FINDINGS: Underinflation. Stable cardiopericardial silhouette. No pneumothorax, edema or consolidation. No pleural effusion. IMPRESSION: Underinflation.  No acute cardiopulmonary disease Electronically Signed   By: Karen Kays M.D.   On: 12/09/2022 11:11    Procedures Procedures    Medications Ordered in ED Medications  aspirin chewable tablet 324 mg (324 mg Oral Given 12/09/22 1250)  HYDROmorphone (DILAUDID) injection 1 mg (1 mg Intravenous Given 12/09/22 1331)  sodium chloride 0.9 % bolus 1,000 mL (0 mLs Intravenous Paused 12/09/22 1542)    ED Course/ Medical Decision Making/ A&P                             Medical Decision Making Amount and/or Complexity of Data Reviewed Labs: ordered.    Details: Normal troponin Radiology: ordered and independent interpretation performed.    Details: No CHF ECG/medicine tests: ordered and independent interpretation performed.    Details: No ischemia  Risk OTC drugs. Prescription drug management.   Doubt ACS, would be very atypical.  Troponin normal after days of chest pain.  Given the pleuritic nature and degree of pain and previous history of PE, will get CTA.  Not hypoxic.  Her tachypnea is probably more pain related than true shortness of breath.  Lungs are clear.  Care transferred to Dr. Fredderick Phenix.        Final Clinical Impression(s) / ED Diagnoses Final  diagnoses:  None    Rx / DC Orders ED Discharge Orders     None         Pricilla Loveless, MD 12/09/22 1600

## 2022-12-09 NOTE — ED Provider Notes (Addendum)
Patient is a 57 year old female with a history of HIV and PE on anticoagulants who presents with pleuritic chest pain.  Care was taken over from Dr. Criss Alvine. she has also had a productive cough recently.  No fever.  No hypoxia.  She is compliant with her antiretroviral medications.  Her last counts were good.  CT scan shows no evidence of PE.  There is some bibasilar atelectasis versus pneumonia.  Given her productive cough, we will go ahead and treat her with antibiotics.  She was given initial dose of Zithromax here and will give her prescription for 4 more days.  Advised her to follow-up with her PCP within the next few days.  Return precautions were given.   Rolan Bucco, MD 12/09/22 1754    Rolan Bucco, MD 12/09/22 1754

## 2022-12-10 LAB — URINE CYTOLOGY ANCILLARY ONLY
Chlamydia: NEGATIVE
Comment: NEGATIVE
Comment: NORMAL
Neisseria Gonorrhea: NEGATIVE

## 2022-12-11 LAB — CBC WITH DIFFERENTIAL/PLATELET
Absolute Monocytes: 290 cells/uL (ref 200–950)
Basophils Absolute: 32 cells/uL (ref 0–200)
Basophils Relative: 0.7 %
Eosinophils Absolute: 69 cells/uL (ref 15–500)
Eosinophils Relative: 1.5 %
HCT: 40.4 % (ref 35.0–45.0)
Hemoglobin: 14 g/dL (ref 11.7–15.5)
Lymphs Abs: 1978 cells/uL (ref 850–3900)
MCHC: 34.7 g/dL (ref 32.0–36.0)
MCV: 90.6 fL (ref 80.0–100.0)
MPV: 10.3 fL (ref 7.5–12.5)
Neutro Abs: 2231 cells/uL (ref 1500–7800)
Neutrophils Relative %: 48.5 %
RDW: 12.9 % (ref 11.0–15.0)
Total Lymphocyte: 43 %

## 2022-12-11 LAB — HIV-1 RNA QUANT-NO REFLEX-BLD
HIV 1 RNA Quant: NOT DETECTED Copies/mL
HIV-1 RNA Quant, Log: NOT DETECTED Log cps/mL

## 2022-12-11 LAB — COMPLETE METABOLIC PANEL WITH GFR
AG Ratio: 1.9 (calc) (ref 1.0–2.5)
ALT: 13 U/L (ref 6–29)
AST: 15 U/L (ref 10–35)
Albumin: 4.7 g/dL (ref 3.6–5.1)
Alkaline phosphatase (APISO): 95 U/L (ref 37–153)
BUN/Creatinine Ratio: 14 (calc) (ref 6–22)
BUN: 19 mg/dL (ref 7–25)
CO2: 25 mmol/L (ref 20–32)
Calcium: 10 mg/dL (ref 8.6–10.4)
Chloride: 105 mmol/L (ref 98–110)
Creat: 1.33 mg/dL — ABNORMAL HIGH (ref 0.50–1.03)
Globulin: 2.5 g/dL (calc) (ref 1.9–3.7)
Glucose, Bld: 77 mg/dL (ref 65–99)
Potassium: 4.4 mmol/L (ref 3.5–5.3)
Sodium: 141 mmol/L (ref 135–146)
Total Bilirubin: 0.6 mg/dL (ref 0.2–1.2)
Total Protein: 7.2 g/dL (ref 6.1–8.1)
eGFR: 47 mL/min/{1.73_m2} — ABNORMAL LOW (ref >=60)

## 2022-12-11 LAB — T-HELPER CELL (CD4) - (RCID CLINIC ONLY)
CD4 % Helper T Cell: 56 % (ref 33–65)
CD4 T Cell Abs: 1059 /uL (ref 400–1790)

## 2022-12-11 LAB — RPR: RPR Ser Ql: NONREACTIVE

## 2022-12-23 ENCOUNTER — Other Ambulatory Visit: Payer: Self-pay

## 2022-12-23 ENCOUNTER — Ambulatory Visit (INDEPENDENT_AMBULATORY_CARE_PROVIDER_SITE_OTHER): Payer: Medicaid Other | Admitting: Family

## 2022-12-23 ENCOUNTER — Encounter: Payer: Self-pay | Admitting: Family

## 2022-12-23 ENCOUNTER — Ambulatory Visit: Payer: Medicaid Other | Admitting: Internal Medicine

## 2022-12-23 ENCOUNTER — Other Ambulatory Visit: Payer: Self-pay | Admitting: Family

## 2022-12-23 VITALS — BP 126/72 | HR 84 | Temp 98.3°F | Ht 67.5 in | Wt 192.0 lb

## 2022-12-23 DIAGNOSIS — F17210 Nicotine dependence, cigarettes, uncomplicated: Secondary | ICD-10-CM

## 2022-12-23 DIAGNOSIS — Z9189 Other specified personal risk factors, not elsewhere classified: Secondary | ICD-10-CM

## 2022-12-23 DIAGNOSIS — F17211 Nicotine dependence, cigarettes, in remission: Secondary | ICD-10-CM | POA: Diagnosis not present

## 2022-12-23 DIAGNOSIS — Z79899 Other long term (current) drug therapy: Secondary | ICD-10-CM

## 2022-12-23 DIAGNOSIS — B20 Human immunodeficiency virus [HIV] disease: Secondary | ICD-10-CM

## 2022-12-23 DIAGNOSIS — N1831 Chronic kidney disease, stage 3a: Secondary | ICD-10-CM | POA: Diagnosis not present

## 2022-12-23 DIAGNOSIS — Z0000 Encounter for general adult medical examination without abnormal findings: Secondary | ICD-10-CM | POA: Insufficient documentation

## 2022-12-23 DIAGNOSIS — N183 Chronic kidney disease, stage 3 unspecified: Secondary | ICD-10-CM | POA: Insufficient documentation

## 2022-12-23 DIAGNOSIS — F250 Schizoaffective disorder, bipolar type: Secondary | ICD-10-CM

## 2022-12-23 DIAGNOSIS — Z113 Encounter for screening for infections with a predominantly sexual mode of transmission: Secondary | ICD-10-CM

## 2022-12-23 DIAGNOSIS — N644 Mastodynia: Secondary | ICD-10-CM

## 2022-12-23 MED ORDER — LAMOTRIGINE 25 MG PO TABS: 50.0000 mg | ORAL_TABLET | Freq: Every day | ORAL | 1 refills | Status: DC

## 2022-12-23 MED ORDER — QUETIAPINE FUMARATE ER 300 MG PO TB24
300.0000 mg | ORAL_TABLET | Freq: Every day | ORAL | 1 refills | Status: DC
Start: 2022-12-23 — End: 2023-09-08

## 2022-12-23 MED ORDER — DOXEPIN HCL 25 MG PO CAPS
25.0000 mg | ORAL_CAPSULE | Freq: Every day | ORAL | 1 refills | Status: DC
Start: 2022-12-23 — End: 2023-03-10

## 2022-12-23 NOTE — Assessment & Plan Note (Signed)
Discussed importance of safe sexual practice and condom use. Condoms and STD testing offered.  Referrals placed for colon cancer and breast cancer screening. Will schedule appointment with Tracy Alberts, NP for Pap smear.  Routine dental care up to date with dentures. Declines vaccinations. Will be due for Shingrix and Tetanus.

## 2022-12-23 NOTE — Assessment & Plan Note (Signed)
Recently quit smoking approximately 2 months ago.  Congratulated on this accomplishment.  Encouraged to remain tobacco free reduce risk of cardiovascular, respiratory, renal, and malignant disease in the future.Marland Kitchen

## 2022-12-23 NOTE — Progress Notes (Signed)
Brief Narrative   Patient ID: Tracy [name], female    DOB: March 21, 1966, 57 y.o.   MRN: [id number]   Tracy [name] is a 57 y/o female diagnosed with HIV disease in 2000 with unknown risk factor. Initial CD4 count and viral load are unavailable. Did not start treatment until 2007. No history of opportunistic infection. Genotype reviewed with no significant medication resistant mutations. ART experienced with Kaletra/Trivizir, Epzicom/Viread/Prezista/Norvir, and Complera/Tivicay.   Subjective:    Chief Complaint  Patient presents with   Follow-up   HIV Positive/AIDS    HPI:  Tracy [name] is a 57 y.o. female with HIV disease last seen on 06/15/2022 by Dr. Earlene Plater with well-controlled virus and good adherence and tolerance to Complera and Tivicay.  Viral load was undetectable with CD4 count 1062.  Most recent lab work completed on 12/09/2022 with viral load that is undetectable and CD4 count of 1059.  Renal function with creatinine of 1.33 and estimated GFR 47 unchanged from previous. Creatinine clearance of 57  Hepatic function and electrolytes within normal ranges.  STI testing negative.  Here today for follow-up.  Tracy [name] has been doing well since her last office visit and has quit smoking about 2 months ago. Continues to take Complera and Tivicay with no adverse side effects or problems obtaining medication. Requesting refills of mental health medications as she is seeking a new provider. No new concerns/complaints. Healthcare maintenance due includes mammogram, Pap smear, and colonoscopy. Dental care up to date with dentures last November. Condoms and STD testing offered.   Denies fevers, chills, night sweats, headaches, changes in vision, neck pain/stiffness, nausea, diarrhea, vomiting, lesions or rashes.  Allergies  Allergen Reactions   Pork-Derived Products Other (See Comments)    Religious practice   Flexeril [Cyclobenzaprine] Rash   Moxifloxacin Rash   Tetracyclines & Related  Itching   Tramadol Itching      Outpatient Medications Prior to Visit  Medication Sig Dispense Refill   amLODipine (NORVASC) 5 MG tablet Take 5 mg by mouth daily.     buPROPion (WELLBUTRIN XL) 150 MG 24 hr tablet Take 150 mg by mouth every morning.     dolutegravir (TIVICAY) 50 MG tablet Take 1 tablet (50 mg total) by mouth daily. 90 tablet 3   emtricitabine-rilpivir-tenofovir DF (COMPLERA) 200-25-300 MG tablet Take 1 tablet by mouth daily. 90 tablet 3   olmesartan (BENICAR) 20 MG tablet Take 20 mg by mouth daily.     rivaroxaban (XARELTO) 20 MG TABS tablet Take 20 mg by mouth at bedtime.     doxepin (SINEQUAN) 25 MG capsule Take 25 mg by mouth at bedtime.     lamoTRIgine (LAMICTAL) 25 MG tablet Take 50 mg by mouth daily.     QUEtiapine (SEROQUEL XR) 300 MG 24 hr tablet Take 300 mg by mouth at bedtime.     gabapentin (NEURONTIN) 300 MG capsule Take 1 capsule (300 mg total) by mouth 3 (three) times daily as needed for up to 14 days. 42 capsule 0   azithromycin (ZITHROMAX Z-PAK) 250 MG tablet Take 1 tablet (250 mg total) by mouth daily. (Patient not taking: Reported on 12/23/2022) 4 tablet 0   No facility-administered medications prior to visit.     Past Medical History:  Diagnosis Date   Arthritis    Chronic back pain    Chronic headaches    Depression    HIV infection (HCC)    Joint pain    Localized swelling of both lower legs  Legs, feet and hands   Pre-diabetes    Pulmonary embolism (HCC)    on Xarelto   Schizoaffective disorder (HCC)    Seasonal allergies    Stroke (cerebrum) (HCC)    patient denies, states it was a heat stroke   Weight loss      Past Surgical History:  Procedure Laterality Date   ABDOMINAL EXPOSURE N/A 07/27/2018   Procedure: ABDOMINAL EXPOSURE;  Surgeon: Nada Libman, MD;  Location: [facility] OR;  Service: Vascular;  Laterality: N/A;   ABDOMINAL HYSTERECTOMY     ANTERIOR LUMBAR FUSION N/A 07/27/2018   Procedure: ANTERIOR LUMBAR FUSION L5-S1;   Surgeon: Venita Lick, MD;  Location: [facility] OR;  Service: Orthopedics;  Laterality: N/A;  3 hrs/ Dr. Myra Gianotti to do approach HIV Positive   BREAST EXCISIONAL BIOPSY Right 1990's   BREAST SURGERY     TRANSFORAMINAL LUMBAR INTERBODY FUSION (TLIF) WITH PEDICLE SCREW FIXATION 1 LEVEL N/A 10/02/2021   Procedure: TRANSFORAMINAL LUMBAR INTERBODY FUSION LUMBAR FOUR THROUGH FIVE;  Surgeon: Venita Lick, MD;  Location: [facility] OR;  Service: Orthopedics;  Laterality: N/A;      Review of Systems  Constitutional:  Negative for appetite change, chills, diaphoresis, fatigue, fever and unexpected weight change.  Eyes:        Negative for acute change in vision  Respiratory:  Negative for chest tightness, shortness of breath and wheezing.   Cardiovascular:  Negative for chest pain.  Gastrointestinal:  Negative for diarrhea, nausea and vomiting.  Genitourinary:  Negative for dysuria, pelvic pain and vaginal discharge.  Musculoskeletal:  Negative for neck pain and neck stiffness.  Skin:  Negative for rash.  Neurological:  Negative for seizures, syncope, weakness and headaches.  Hematological:  Negative for adenopathy. Does not bruise/bleed easily.  Psychiatric/Behavioral:  Negative for hallucinations.       Objective:    BP 126/72   Pulse 84   Temp 98.3 F (36.8 C) (Oral)   Ht 5' 7.5" (1.715 m)   Wt 192 lb (87.1 kg)   SpO2 98%   BMI 29.63 kg/m  Nursing note and vital signs reviewed.  Physical Exam Constitutional:      General: She is not in acute distress.    Appearance: She is well-developed.  Eyes:     Conjunctiva/sclera: Conjunctivae normal.  Cardiovascular:     Rate and Rhythm: Normal rate and regular rhythm.     Heart sounds: Normal heart sounds. No murmur heard.    No friction rub. No gallop.  Pulmonary:     Effort: Pulmonary effort is normal. No respiratory distress.     Breath sounds: Normal breath sounds. No wheezing or rales.  Chest:     Chest wall: No tenderness.  Abdominal:      General: Bowel sounds are normal.     Palpations: Abdomen is soft.     Tenderness: There is no abdominal tenderness.  Musculoskeletal:     Cervical back: Neck supple.  Lymphadenopathy:     Cervical: No cervical adenopathy.  Skin:    General: Skin is warm and dry.     Findings: No rash.  Neurological:     Mental Status: She is alert and oriented to person, place, and time.  Psychiatric:        Behavior: Behavior normal.        Thought Content: Thought content normal.        Judgment: Judgment normal.         12/23/2022    8:37 AM 02/25/2022  3:57 PM 08/31/2017   10:31 AM 07/28/2017    1:46 PM 04/27/2017    9:54 AM  Depression screen PHQ 2/9  Decreased Interest 0 0 0 1 0  Down, Depressed, Hopeless 0 1 0 1 0  PHQ - 2 Score 0 1 0 2 0  Altered sleeping    1   Tired, decreased energy    1   Change in appetite    1   Feeling bad or failure about yourself     2   Trouble concentrating    0   Moving slowly or fidgety/restless    0   Suicidal thoughts    0   PHQ-9 Score    7   Difficult doing work/chores    Somewhat difficult        Assessment & Plan:    Patient Active Problem List   Diagnosis Date Noted   Chronic kidney disease, stage III (moderate) (HCC) 12/23/2022   Healthcare maintenance 12/23/2022   S/P lumbar fusion 10/02/2021   Vaccine counseling 08/12/2021   Exposure to hepatitis C 08/12/2021   Degenerative disc disease at L5-S1 level 07/27/2018   Schizophrenia, acute (HCC) 06/20/2018   Conversion disorder    Cervical radiculopathy    Chronic headaches 12/29/2016   Breast pain, left 05/25/2016   Dry skin dermatitis 02/10/2016   Itching due to drug 12/26/2015   HIV disease (HCC) 10/03/2015   Schizoaffective disorder (HCC) 10/03/2015   Chronic pain syndrome 10/03/2015   Cigarette smoker 10/03/2015     Problem List Items Addressed This Visit       Genitourinary   Chronic kidney disease, stage III (moderate) (HCC)    Tracy [name] has chronic kidney  disease stage III which has been stable over the past year likely multifactorial.  She has quit smoking recently and blood pressure remains well-controlled.  Creatinine clearance remains at a satisfactory level.  Will consider changing Complera if renal function worsens.        Other   HIV disease (HCC) - Primary    Tracy [name] continues to have well-controlled virus with good adherence and tolerance to Complera and Tivicay.  Reviewed lab work and discussed plan of care and U equals U.  Continue current dose of Complera and Tivicay.  Plan for follow-up in 6 months or sooner if needed with lab work 1 to 2 weeks prior to appointment.      Relevant Orders   COMPLETE METABOLIC PANEL WITH GFR   HIV-1 RNA quant-no reflex-bld   T-helper cell (CD4)- ([facility] clinic only)   Schizoaffective disorder (HCC)    Previously maintained on quetiapine, Lamictal, and doxepin.  In the process of finding a new provider for her mental health requesting refills of medication.  Refill quetiapine, Lamictal, and doxepin.      Relevant Medications   QUEtiapine (SEROQUEL XR) 300 MG 24 hr tablet   lamoTRIgine (LAMICTAL) 25 MG tablet   doxepin (SINEQUAN) 25 MG capsule   Cigarette smoker    Recently quit smoking approximately 2 months ago.  Congratulated on this accomplishment.  Encouraged to remain tobacco free reduce risk of cardiovascular, respiratory, renal, and malignant disease in the future.Marland Kitchen      Healthcare maintenance    Discussed importance of safe sexual practice and condom use. Condoms and STD testing offered.  Referrals placed for colon cancer and breast cancer screening. Will schedule appointment with Rexene Alberts, NP for Pap smear.  Routine dental care up to date with dentures. Declines  vaccinations. Will be due for Shingrix and Tetanus.       Other Visit Diagnoses     At risk for colon cancer       Relevant Orders   Ambulatory referral to Gastroenterology   At risk for breast cancer        Relevant Orders   MM Digital Screening   Pharmacologic therapy       Relevant Orders   Lipid panel   Screening for STDs (sexually transmitted diseases)       Relevant Orders   RPR        I have discontinued Tracy [name]'s azithromycin. I have also changed her QUEtiapine, lamoTRIgine, and doxepin. Additionally, I am having her maintain her buPROPion, rivaroxaban, gabapentin, amLODipine, Tivicay, Complera, and olmesartan.   Meds ordered this encounter  Medications   QUEtiapine (SEROQUEL XR) 300 MG 24 hr tablet    Sig: Take 1 tablet (300 mg total) by mouth at bedtime.    Dispense:  30 tablet    Refill:  1    Order Specific Question:   Supervising Provider    Answer:   Drue Second, CYNTHIA [[id number]]   lamoTRIgine (LAMICTAL) 25 MG tablet    Sig: Take 2 tablets (50 mg total) by mouth daily.    Dispense:  30 tablet    Refill:  1    Order Specific Question:   Supervising Provider    Answer:   Drue Second, CYNTHIA [[id number]]   doxepin (SINEQUAN) 25 MG capsule    Sig: Take 1 capsule (25 mg total) by mouth at bedtime.    Dispense:  30 capsule    Refill:  1    Order Specific Question:   Supervising Provider    Answer:   Judyann Munson [[id number]]     Follow-up: Return in about 6 months (around 06/24/2023).   Marcos Eke, MSN, FNP-C Nurse Practitioner [facility] Main number: [phone]

## 2022-12-23 NOTE — Assessment & Plan Note (Signed)
Tracy [name] continues to have well-controlled virus with good adherence and tolerance to Complera and Tivicay.  Reviewed lab work and discussed plan of care and U equals U.  Continue current dose of Complera and Tivicay.  Plan for follow-up in 6 months or sooner if needed with lab work 1 to 2 weeks prior to appointment.

## 2022-12-23 NOTE — Assessment & Plan Note (Signed)
Previously maintained on quetiapine, Lamictal, and doxepin.  In the process of finding a new provider for her mental health requesting refills of medication.  Refill quetiapine, Lamictal, and doxepin.

## 2022-12-23 NOTE — Progress Notes (Signed)
The ASCVD Risk score (Arnett DK, et al., 2019) failed to calculate for the following reasons:   The patient has a prior MI or stroke diagnosis  Sandie Ano, RN

## 2022-12-23 NOTE — Assessment & Plan Note (Signed)
Tracy [name] has chronic kidney disease stage III which has been stable over the past year likely multifactorial.  She has quit smoking recently and blood pressure remains well-controlled.  Creatinine clearance remains at a satisfactory level.  Will consider changing Complera if renal function worsens.

## 2022-12-23 NOTE — Patient Instructions (Signed)
Nice to see you.  Continue to take your medication daily as prescribed.  Refills have been sent to the pharmacy.  Set up appointment for PAP smear with Judeth Cornfield.  They will call for colonoscopy and mammogram appointment.   Plan for follow up in 6 months or sooner if needed with lab work 1-2 weeks prior to appointment.   Have a great day and stay safe!

## 2022-12-28 ENCOUNTER — Encounter (HOSPITAL_COMMUNITY): Payer: Self-pay

## 2022-12-28 ENCOUNTER — Observation Stay (HOSPITAL_COMMUNITY)
Admission: EM | Admit: 2022-12-28 | Discharge: 2022-12-30 | Disposition: A | Discharge status: Home / Self Care | Payer: Medicaid Other | Attending: Internal Medicine | Admitting: Internal Medicine

## 2022-12-28 ENCOUNTER — Other Ambulatory Visit: Payer: Self-pay

## 2022-12-28 ENCOUNTER — Emergency Department (HOSPITAL_COMMUNITY): Payer: Medicaid Other

## 2022-12-28 DIAGNOSIS — F17210 Nicotine dependence, cigarettes, uncomplicated: Secondary | ICD-10-CM | POA: Diagnosis present

## 2022-12-28 DIAGNOSIS — R55 Syncope and collapse: Secondary | ICD-10-CM | POA: Diagnosis present

## 2022-12-28 DIAGNOSIS — I951 Orthostatic hypotension: Secondary | ICD-10-CM | POA: Diagnosis not present

## 2022-12-28 DIAGNOSIS — Z7901 Long term (current) use of anticoagulants: Secondary | ICD-10-CM | POA: Diagnosis not present

## 2022-12-28 DIAGNOSIS — F17200 Nicotine dependence, unspecified, uncomplicated: Secondary | ICD-10-CM | POA: Diagnosis present

## 2022-12-28 DIAGNOSIS — Z87891 Personal history of nicotine dependence: Secondary | ICD-10-CM | POA: Diagnosis not present

## 2022-12-28 DIAGNOSIS — Z6831 Body mass index (BMI) 31.0-31.9, adult: Secondary | ICD-10-CM | POA: Insufficient documentation

## 2022-12-28 DIAGNOSIS — Z79899 Other long term (current) drug therapy: Secondary | ICD-10-CM | POA: Insufficient documentation

## 2022-12-28 DIAGNOSIS — D631 Anemia in chronic kidney disease: Secondary | ICD-10-CM | POA: Insufficient documentation

## 2022-12-28 DIAGNOSIS — E669 Obesity, unspecified: Secondary | ICD-10-CM | POA: Diagnosis not present

## 2022-12-28 DIAGNOSIS — B20 Human immunodeficiency virus [HIV] disease: Secondary | ICD-10-CM | POA: Diagnosis present

## 2022-12-28 DIAGNOSIS — N179 Acute kidney failure, unspecified: Secondary | ICD-10-CM | POA: Diagnosis not present

## 2022-12-28 DIAGNOSIS — Z21 Asymptomatic human immunodeficiency virus [HIV] infection status: Secondary | ICD-10-CM | POA: Insufficient documentation

## 2022-12-28 DIAGNOSIS — I129 Hypertensive chronic kidney disease with stage 1 through stage 4 chronic kidney disease, or unspecified chronic kidney disease: Secondary | ICD-10-CM | POA: Insufficient documentation

## 2022-12-28 DIAGNOSIS — E860 Dehydration: Secondary | ICD-10-CM | POA: Diagnosis not present

## 2022-12-28 DIAGNOSIS — E872 Acidosis, unspecified: Secondary | ICD-10-CM | POA: Diagnosis not present

## 2022-12-28 DIAGNOSIS — Z1152 Encounter for screening for COVID-19: Secondary | ICD-10-CM | POA: Insufficient documentation

## 2022-12-28 DIAGNOSIS — N1831 Chronic kidney disease, stage 3a: Secondary | ICD-10-CM | POA: Insufficient documentation

## 2022-12-28 DIAGNOSIS — Z86711 Personal history of pulmonary embolism: Secondary | ICD-10-CM | POA: Diagnosis not present

## 2022-12-28 DIAGNOSIS — Z8673 Personal history of transient ischemic attack (TIA), and cerebral infarction without residual deficits: Secondary | ICD-10-CM | POA: Insufficient documentation

## 2022-12-28 DIAGNOSIS — F259 Schizoaffective disorder, unspecified: Secondary | ICD-10-CM | POA: Diagnosis present

## 2022-12-28 LAB — TROPONIN I (HIGH SENSITIVITY): Troponin I (High Sensitivity): 2 ng/L (ref <18)

## 2022-12-28 LAB — I-STAT CHEM 8, ED
BUN: 21 mg/dL — ABNORMAL HIGH (ref 6–20)
Calcium, Ion: 1.11 mmol/L — ABNORMAL LOW (ref 1.15–1.40)
Chloride: 108 mmol/L (ref 98–111)
Creatinine, Ser: 1.8 mg/dL — ABNORMAL HIGH (ref 0.44–1.00)
Glucose, Bld: 114 mg/dL — ABNORMAL HIGH (ref 70–99)
HCT: 38 % (ref 36.0–46.0)
Hemoglobin: 12.9 g/dL (ref 12.0–15.0)
Potassium: 4.9 mmol/L (ref 3.5–5.1)
Sodium: 141 mmol/L (ref 135–145)
TCO2: 25 mmol/L (ref 22–32)

## 2022-12-28 LAB — CBC
HCT: 38.6 % (ref 36.0–46.0)
Hemoglobin: 12.6 g/dL (ref 12.0–15.0)
MCH: 31.2 pg (ref 26.0–34.0)
MCHC: 32.6 g/dL (ref 30.0–36.0)
MCV: 95.5 fL (ref 80.0–100.0)
Platelets: 263 10*3/uL (ref 150–400)
RBC: 4.04 MIL/uL (ref 3.87–5.11)
RDW: 13.2 % (ref 11.5–15.5)
WBC: 5.6 10*3/uL (ref 4.0–10.5)
nRBC: 0 % (ref 0.0–0.2)

## 2022-12-28 LAB — ETHANOL: Alcohol, Ethyl (B): <10 mg/dL (ref <10)

## 2022-12-28 LAB — PROTIME-INR
INR: 1.4 — ABNORMAL HIGH (ref 0.8–1.2)
Prothrombin Time: 17.3 seconds — ABNORMAL HIGH (ref 11.4–15.2)

## 2022-12-28 LAB — SAMPLE TO BLOOD BANK

## 2022-12-28 LAB — LACTIC ACID, PLASMA: Lactic Acid, Venous: 2.3 mmol/L (ref 0.5–1.9)

## 2022-12-28 MED ORDER — NALOXONE HCL 2 MG/2ML IJ SOSY: 1.0000 mg | PREFILLED_SYRINGE | Freq: Once | INTRAMUSCULAR | Status: DC

## 2022-12-28 MED ORDER — SODIUM CHLORIDE 0.9 % IV SOLN
INTRAVENOUS | Status: DC
  Administered 2022-12-28: 1000 mL via INTRAVENOUS

## 2022-12-28 MED ORDER — IOHEXOL 350 MG/ML SOLN
75.0000 mL | Freq: Once | INTRAVENOUS | Status: AC | PRN
  Administered 2022-12-28: 75 mL via INTRAVENOUS

## 2022-12-28 MED ORDER — SODIUM CHLORIDE 0.9 % IV BOLUS
1000.0000 mL | Freq: Once | INTRAVENOUS | Status: AC
  Administered 2022-12-28: 1000 mL via INTRAVENOUS

## 2022-12-28 MED ORDER — LACTATED RINGERS IV BOLUS
1000.0000 mL | Freq: Once | INTRAVENOUS | Status: AC
  Administered 2022-12-28: 1000 mL via INTRAVENOUS

## 2022-12-28 NOTE — ED Notes (Signed)
Trauma Response Nurse Documentation   Tracy [name] is a 57 y.o. female arriving to [facility] ED via [facility]  On Xarelto (rivaroxaban) daily. Trauma was activated as a Level 1 by Tracy [name] based on the following trauma criteria Anytime Systolic Blood Pressure < 90.  Patient cleared for CT by Dr. Sheliah [name]. Pt transported to CT with trauma response nurse present to monitor. RN remained with the patient throughout their absence from the department for clinical observation.   GCS 15.  History   Past Medical History:  Diagnosis Date   Arthritis    Chronic back pain    Chronic headaches    Depression    HIV infection (HCC)    Joint pain    Localized swelling of both lower legs    Legs, feet and hands   Pre-diabetes    Pulmonary embolism (HCC)    on Xarelto   Schizoaffective disorder (HCC)    Seasonal allergies    Stroke (cerebrum) (HCC)    patient denies, states it was a heat stroke   Weight loss      Past Surgical History:  Procedure Laterality Date   ABDOMINAL EXPOSURE N/A 07/27/2018   Procedure: ABDOMINAL EXPOSURE;  Surgeon: Tracy Libman, MD;  Location: [facility] OR;  Service: Vascular;  Laterality: N/A;   ABDOMINAL HYSTERECTOMY     ANTERIOR LUMBAR FUSION N/A 07/27/2018   Procedure: ANTERIOR LUMBAR FUSION L5-S1;  Surgeon: Tracy Lick, MD;  Location: [facility] OR;  Service: Orthopedics;  Laterality: N/A;  3 hrs/ Dr. Myra [name] to do approach HIV Positive   BREAST EXCISIONAL BIOPSY Right 1990's   BREAST SURGERY     TRANSFORAMINAL LUMBAR INTERBODY FUSION (TLIF) WITH PEDICLE SCREW FIXATION 1 LEVEL N/A 10/02/2021   Procedure: TRANSFORAMINAL LUMBAR INTERBODY FUSION LUMBAR FOUR THROUGH FIVE;  Surgeon: Tracy Lick, MD;  Location: [facility] OR;  Service: Orthopedics;  Laterality: N/A;       Initial Focused Assessment (If applicable, or please see trauma documentation): Airway-- intact, no visible obstruction Breathing-- spontaneous, unlabored Circulation-- no obvious  bleeding noted  CT's Completed:   CT Head, CT C-Spine, CT Chest w/ contrast, and CT abdomen/pelvis w/ contrast   Interventions:  See event summary  Plan for disposition:  Admission to floor   Consults completed:  {Trauma Consults:26862} at ***.  Event Summary: Patient brought in by [facility] from home. Patient had a syncopal event and fell striking her head. Patient on Xarelto. Report of other falls recently. Patient hypotensive with EMS into the 80s. On arrival patient transferred to hospital stretcher. Manual BP obtained, 88/62. 18 G LAC established, 1L NS started. Xray chest and pelvis completed. Patient to CT with Primary RN.  MTP Summary (If applicable):  N/A  Bedside handoff with ED RN Tracy [name].    Tracy [name]  Trauma Response RN  Please call TRN at [phone] for further assistance.

## 2022-12-28 NOTE — ED Provider Notes (Signed)
[place] EMERGENCY DEPARTMENT AT [facility] Provider Note   HPI: Tracy [name] is a 57 year old female with a past medical history as below presenting today as a level 1 trauma after a fall.  The patient reportedly was feeling warm and lightheaded.  She then proceeded to have a ground-level fall while on Xarelto upstairs.  Her husband reported no loss of consciousness.  The patient does not recall the fall.  They called 911 and the patient was noted to be hypotensive.  Due to her hypotension EMS encoded the patient and she arrives as a level 1 trauma.  Patient denies any urinary symptoms.  She does endorse fever/chills/cough.  EMS report the patient's blood pressures have been in the 80s systolic and they have been unable to obtain IV access.  Past Medical History:  Diagnosis Date   Arthritis    Chronic back pain    Chronic headaches    Depression    HIV infection (HCC)    Joint pain    Localized swelling of both lower legs    Legs, feet and hands   Pre-diabetes    Pulmonary embolism (HCC)    on Xarelto   Schizoaffective disorder (HCC)    Seasonal allergies    Stroke (cerebrum) (HCC)    patient denies, states it was a heat stroke   Weight loss     Past Surgical History:  Procedure Laterality Date   ABDOMINAL EXPOSURE N/A 07/27/2018   Procedure: ABDOMINAL EXPOSURE;  Surgeon: Nada Libman, MD;  Location: [facility] OR;  Service: Vascular;  Laterality: N/A;   ABDOMINAL HYSTERECTOMY     ANTERIOR LUMBAR FUSION N/A 07/27/2018   Procedure: ANTERIOR LUMBAR FUSION L5-S1;  Surgeon: Venita Lick, MD;  Location: [facility] OR;  Service: Orthopedics;  Laterality: N/A;  3 hrs/ Dr. Myra Gianotti to do approach HIV Positive   BREAST EXCISIONAL BIOPSY Right 1990's   BREAST SURGERY     TRANSFORAMINAL LUMBAR INTERBODY FUSION (TLIF) WITH PEDICLE SCREW FIXATION 1 LEVEL N/A 10/02/2021   Procedure: TRANSFORAMINAL LUMBAR INTERBODY FUSION LUMBAR FOUR THROUGH FIVE;  Surgeon: Venita Lick, MD;  Location:  [facility] OR;  Service: Orthopedics;  Laterality: N/A;     Social History   Tobacco Use   Smoking status: Former    Packs/day: .3    Types: Cigarettes    Quit date: 04/26/2016    Years since quitting: 6.6   Smokeless tobacco: Never   Tobacco comments:    1 pack of cigarettes will last a month  Vaping Use   Vaping Use: Former  Substance Use Topics   Alcohol use: No    Alcohol/week: 0.0 standard drinks of alcohol    Comment: quit 12/2005   Drug use: Yes    Types: "Crack" cocaine    Comment: Previous drug use, quit 2004-05; relapsed 05/2018, clean since 06/08/2018      Review of Systems  A complete ROS was performed with pertinent positives/negatives noted in the HPI.   Vitals:   12/29/22 0551 12/29/22 0715  BP: 110/62 106/75  Pulse: 81 65  Resp: 15 17  Temp: (!) 97.5 F (36.4 C) (!) 97.4 F (36.3 C)  SpO2: 100% 100%    Physical Exam Vitals and nursing note reviewed.  Constitutional:      General: She is not in acute distress.    Appearance: She is well-developed.  Eyes:     Conjunctiva/sclera: Conjunctivae normal.  Cardiovascular:     Rate and Rhythm: Normal rate and regular rhythm.  Pulses: Normal pulses.     Heart sounds: Normal heart sounds. No murmur heard.    No friction rub. No gallop.  Pulmonary:     Effort: Pulmonary effort is normal. No respiratory distress.     Breath sounds: Normal breath sounds. No stridor. No wheezing, rhonchi or rales.  Abdominal:     General: There is no distension.     Palpations: Abdomen is soft.     Tenderness: There is no abdominal tenderness. There is no guarding or rebound.  Musculoskeletal:        General: No swelling.  Skin:    General: Skin is warm and dry.     Capillary Refill: Capillary refill takes less than 2 seconds.  Neurological:     Mental Status: She is alert.     Procedures  MDM:  Imaging/radiology results:  CT HEAD WO CONTRAST  Result Date: 12/29/2022 CLINICAL DATA:  Trauma. EXAM: CT HEAD WITHOUT  CONTRAST CT CERVICAL SPINE WITHOUT CONTRAST TECHNIQUE: Multidetector CT imaging of the head and cervical spine was performed following the standard protocol without intravenous contrast. Multiplanar CT image reconstructions of the cervical spine were also generated. RADIATION DOSE REDUCTION: This exam was performed according to the departmental dose-optimization program which includes automated exposure control, adjustment of the mA and/or kV according to patient size and/or use of iterative reconstruction technique. COMPARISON:  Head CT dated 12/24/2021. FINDINGS: CT HEAD FINDINGS Brain: The ventricles and sulci are appropriate size for the patient's age. The gray-white matter discrimination is preserved. There is no acute intracranial hemorrhage. No mass effect or midline shift. No extra-axial fluid collection. Vascular: No hyperdense vessel or unexpected calcification. Skull: Normal. Negative for fracture or focal lesion. Sinuses/Orbits: No acute finding. Other: None CT CERVICAL SPINE FINDINGS Alignment: No acute subluxation. Skull base and vertebrae: No acute fracture. Soft tissues and spinal canal: No prevertebral fluid or swelling. No visible canal hematoma. Disc levels:  No acute findings.  Mild degenerative changes. Upper chest: Negative. Other: None IMPRESSION: 1. No acute intracranial pathology. 2. No acute/traumatic cervical spine pathology. These results were called by telephone at the time of interpretation on 12/28/2022 at 10:45 pm to Dr. Sheliah Hatch, who verbally acknowledged these results. Electronically Signed   By: Elgie Collard M.D.   On: 12/29/2022 00:14   CT CERVICAL SPINE WO CONTRAST  Result Date: 12/29/2022 CLINICAL DATA:  Trauma. EXAM: CT HEAD WITHOUT CONTRAST CT CERVICAL SPINE WITHOUT CONTRAST TECHNIQUE: Multidetector CT imaging of the head and cervical spine was performed following the standard protocol without intravenous contrast. Multiplanar CT image reconstructions of the cervical  spine were also generated. RADIATION DOSE REDUCTION: This exam was performed according to the departmental dose-optimization program which includes automated exposure control, adjustment of the mA and/or kV according to patient size and/or use of iterative reconstruction technique. COMPARISON:  Head CT dated 12/24/2021. FINDINGS: CT HEAD FINDINGS Brain: The ventricles and sulci are appropriate size for the patient's age. The gray-white matter discrimination is preserved. There is no acute intracranial hemorrhage. No mass effect or midline shift. No extra-axial fluid collection. Vascular: No hyperdense vessel or unexpected calcification. Skull: Normal. Negative for fracture or focal lesion. Sinuses/Orbits: No acute finding. Other: None CT CERVICAL SPINE FINDINGS Alignment: No acute subluxation. Skull base and vertebrae: No acute fracture. Soft tissues and spinal canal: No prevertebral fluid or swelling. No visible canal hematoma. Disc levels:  No acute findings.  Mild degenerative changes. Upper chest: Negative. Other: None IMPRESSION: 1. No acute intracranial pathology. 2. No acute/traumatic  cervical spine pathology. These results were called by telephone at the time of interpretation on 12/28/2022 at 10:45 pm to Dr. Sheliah Hatch, who verbally acknowledged these results. Electronically Signed   By: Elgie Collard M.D.   On: 12/29/2022 00:14   CT CHEST ABDOMEN PELVIS W CONTRAST  Result Date: 12/28/2022 CLINICAL DATA:  Trauma. EXAM: CT CHEST, ABDOMEN, AND PELVIS WITH CONTRAST TECHNIQUE: Multidetector CT imaging of the chest, abdomen and pelvis was performed following the standard protocol during bolus administration of intravenous contrast. RADIATION DOSE REDUCTION: This exam was performed according to the departmental dose-optimization program which includes automated exposure control, adjustment of the mA and/or kV according to patient size and/or use of iterative reconstruction technique. CONTRAST:  75mL  OMNIPAQUE IOHEXOL 350 MG/ML SOLN COMPARISON:  Chest CT dated 12/09/2022. FINDINGS: CT CHEST FINDINGS Cardiovascular: There is no cardiomegaly. Probable trace pericardial effusion. The thoracic aorta is unremarkable. The origins of the great vessels of the aortic arch and the central pulmonary arteries appear patent as visualized. Mediastinum/Nodes: No hilar or mediastinal adenopathy. Similar appearance of fluid density structure inferior to the right pulmonary vein, likely a duplicated cyst or pericardial recess. The esophagus and thyroid gland are grossly unremarkable. No mediastinal fluid collection. Lungs/Pleura: Bibasilar subpleural atelectasis/scarring. Minimal biapical subpleural blebs. No focal consolidation, pleural effusion, or pneumothorax. The central airways are patent. Musculoskeletal: No acute osseous pathology. CT ABDOMEN PELVIS FINDINGS No intra-abdominal free air or free fluid. Hepatobiliary: No focal liver abnormality is seen. No gallstones, gallbladder wall thickening, or biliary dilatation. Pancreas: Unremarkable. No pancreatic ductal dilatation or surrounding inflammatory changes. Spleen: Normal in size without focal abnormality. Adrenals/Urinary Tract: The adrenal glands are unremarkable. There is a 2 mm nonobstructing right renal upper pole calculus. No hydronephrosis. The left kidney is unremarkable. There is symmetric enhancement and excretion of contrast by both kidneys. The visualized ureters appear unremarkable. The urinary bladder is collapsed. Stomach/Bowel: There is no bowel obstruction or active inflammation. The appendix is normal. Vascular/Lymphatic: Mild aortoiliac atherosclerotic disease. The IVC is unremarkable. No portal venous gas. There is no adenopathy. Reproductive: Hysterectomy.  No adnexal masses. Other: Small fat containing umbilical hernia. Musculoskeletal: L4-L5 disc spacer and posterior fusion. L5-S1 anterior fusion. No acute osseous pathology. IMPRESSION: 1. No  acute/traumatic intrathoracic, abdominal, or pelvic pathology. 2. A 2 mm nonobstructing right renal upper pole calculus. No hydronephrosis. 3.  Aortic Atherosclerosis (ICD10-I70.0). Electronically Signed   By: Elgie Collard M.D.   On: 12/28/2022 23:16   DG Pelvis Portable  Result Date: 12/28/2022 CLINICAL DATA:  Recent fall with pelvic pain, initial encounter EXAM: PORTABLE PELVIS 1 VIEWS COMPARISON:  None Available. FINDINGS: Pelvic ring is intact. Postsurgical changes in the lower lumbar spine are noted. No acute fracture is seen. No soft tissue changes are noted. IMPRESSION: No acute abnormality noted. Electronically Signed   By: Alcide Clever M.D.   On: 12/28/2022 22:35   DG Chest Port 1 View  Result Date: 12/28/2022 CLINICAL DATA:  Recent fall with chest pain, initial encounter EXAM: PORTABLE CHEST 1 VIEW COMPARISON:  12/09/2022 FINDINGS: Cardiac shadow is stable. Inspiratory effort is poor. No infiltrate or pneumothorax is seen. Minimal left basilar atelectasis is noted. No bony abnormality is seen. IMPRESSION: Minimal left basilar atelectasis. No other focal abnormality is noted. Electronically Signed   By: Alcide Clever M.D.   On: 12/28/2022 22:34      EKG results: ECG on my interpretation is normal sinus rhythm and rate, without anatomical ischemia representing STEMI, New-onset Arrhythmia, or ischemic equivalent.  Lab results:  Results for orders placed or performed during the hospital encounter of 12/28/22 (from the past 24 hour(s))  Lactic acid, plasma     Status: Abnormal   Collection Time: 12/28/22 10:03 PM  Result Value Ref Range   Lactic Acid, Venous 2.3 (HH) 0.5 - 1.9 mmol/L  Resp panel by RT-PCR (RSV, Flu A&B, Covid) Anterior Nasal Swab     Status: None   Collection Time: 12/28/22 10:20 PM   Specimen: Anterior Nasal Swab  Result Value Ref Range   SARS Coronavirus 2 by RT PCR NEGATIVE NEGATIVE   Influenza A by PCR NEGATIVE NEGATIVE   Influenza B by PCR NEGATIVE  NEGATIVE   Resp Syncytial Virus by PCR NEGATIVE NEGATIVE  Ethanol     Status: None   Collection Time: 12/28/22 10:23 PM  Result Value Ref Range   Alcohol, Ethyl (B) <10 <10 mg/dL  Protime-INR     Status: Abnormal   Collection Time: 12/28/22 10:23 PM  Result Value Ref Range   Prothrombin Time 17.3 (H) 11.4 - 15.2 seconds   INR 1.4 (H) 0.8 - 1.2  Sample to Blood Bank     Status: None   Collection Time: 12/28/22 10:23 PM  Result Value Ref Range   Blood Bank Specimen SAMPLE AVAILABLE FOR TESTING    Sample Expiration      12/31/2022,2359 Performed at [facility], [address] [place], [place] [address]   Troponin I (High Sensitivity)     Status: None   Collection Time: 12/28/22 10:23 PM  Result Value Ref Range   Troponin I (High Sensitivity) 2 <18 ng/L  CBC     Status: None   Collection Time: 12/28/22 10:23 PM  Result Value Ref Range   WBC 5.6 4.0 - 10.5 K/uL   RBC 4.04 3.87 - 5.11 MIL/uL   Hemoglobin 12.6 12.0 - 15.0 g/dL   HCT 62.1 30.8 - 65.7 %   MCV 95.5 80.0 - 100.0 fL   MCH 31.2 26.0 - 34.0 pg   MCHC 32.6 30.0 - 36.0 g/dL   RDW 84.6 96.2 - 95.2 %   Platelets 263 150 - 400 K/uL   nRBC 0.0 0.0 - 0.2 %  I-Stat Chem 8, ED     Status: Abnormal   Collection Time: 12/28/22 10:24 PM  Result Value Ref Range   Sodium 141 135 - 145 mmol/L   Potassium 4.9 3.5 - 5.1 mmol/L   Chloride 108 98 - 111 mmol/L   BUN 21 (H) 6 - 20 mg/dL   Creatinine, Ser 8.41 (H) 0.44 - 1.00 mg/dL   Glucose, Bld 324 (H) 70 - 99 mg/dL   Calcium, Ion 4.01 (L) 1.15 - 1.40 mmol/L   TCO2 25 22 - 32 mmol/L   Hemoglobin 12.9 12.0 - 15.0 g/dL   HCT 02.7 25.3 - 66.4 %  Comprehensive metabolic panel     Status: Abnormal   Collection Time: 12/28/22 11:42 PM  Result Value Ref Range   Sodium 139 135 - 145 mmol/L   Potassium 3.2 (L) 3.5 - 5.1 mmol/L   Chloride 108 98 - 111 mmol/L   CO2 20 (L) 22 - 32 mmol/L   Glucose, Bld 157 (H) 70 - 99 mg/dL   BUN 14 6 - 20 mg/dL   Creatinine, Ser 4.03 (H) 0.44 -  1.00 mg/dL   Calcium 8.4 (L) 8.9 - 10.3 mg/dL   Total Protein 5.5 (L) 6.5 - 8.1 g/dL   Albumin 3.4 (L) 3.5 -  5.0 g/dL   AST 19 15 - 41 U/L   ALT 14 0 - 44 U/L   Alkaline Phosphatase 76 38 - 126 U/L   Total Bilirubin 0.4 0.3 - 1.2 mg/dL   GFR, Estimated 38 (L) >60 mL/min   Anion gap 11 5 - 15  Urinalysis, Routine w reflex microscopic -Urine, Clean Catch     Status: Abnormal   Collection Time: 12/29/22  2:07 AM  Result Value Ref Range   Color, Urine YELLOW YELLOW   APPearance CLEAR CLEAR   Specific Gravity, Urine >1.046 (H) 1.005 - 1.030   pH 5.0 5.0 - 8.0   Glucose, UA NEGATIVE NEGATIVE mg/dL   Hgb urine dipstick NEGATIVE NEGATIVE   Bilirubin Urine NEGATIVE NEGATIVE   Ketones, ur NEGATIVE NEGATIVE mg/dL   Protein, ur NEGATIVE NEGATIVE mg/dL   Nitrite NEGATIVE NEGATIVE   Leukocytes,Ua TRACE (A) NEGATIVE   RBC / HPF 0-5 0 - 5 RBC/hpf   WBC, UA 0-5 0 - 5 WBC/hpf   Bacteria, UA NONE SEEN NONE SEEN   Squamous Epithelial / HPF 0-5 0 - 5 /HPF   Mucus PRESENT   Comprehensive metabolic panel     Status: Abnormal   Collection Time: 12/29/22  6:58 AM  Result Value Ref Range   Sodium 138 135 - 145 mmol/L   Potassium 3.8 3.5 - 5.1 mmol/L   Chloride 111 98 - 111 mmol/L   CO2 21 (L) 22 - 32 mmol/L   Glucose, Bld 110 (H) 70 - 99 mg/dL   BUN 13 6 - 20 mg/dL   Creatinine, Ser 6.57 (H) 0.44 - 1.00 mg/dL   Calcium 8.5 (L) 8.9 - 10.3 mg/dL   Total Protein 5.2 (L) 6.5 - 8.1 g/dL   Albumin 3.1 (L) 3.5 - 5.0 g/dL   AST 17 15 - 41 U/L   ALT 14 0 - 44 U/L   Alkaline Phosphatase 79 38 - 126 U/L   Total Bilirubin 0.4 0.3 - 1.2 mg/dL   GFR, Estimated 47 (L) >60 mL/min   Anion gap 6 5 - 15  Lactic acid, plasma     Status: None   Collection Time: 12/29/22  6:58 AM  Result Value Ref Range   Lactic Acid, Venous 1.0 0.5 - 1.9 mmol/L  CBC with Differential/Platelet     Status: Abnormal   Collection Time: 12/29/22  8:16 AM  Result Value Ref Range   WBC 5.7 4.0 - 10.5 K/uL   RBC 3.40 (L) 3.87 -  5.11 MIL/uL   Hemoglobin 10.4 (L) 12.0 - 15.0 g/dL   HCT 84.6 (L) 96.2 - 95.2 %   MCV 95.6 80.0 - 100.0 fL   MCH 30.6 26.0 - 34.0 pg   MCHC 32.0 30.0 - 36.0 g/dL   RDW 84.1 32.4 - 40.1 %   Platelets 216 150 - 400 K/uL   nRBC 0.0 0.0 - 0.2 %   Neutrophils Relative % 48 %   Neutro Abs 2.7 1.7 - 7.7 K/uL   Lymphocytes Relative 41 %   Lymphs Abs 2.4 0.7 - 4.0 K/uL   Monocytes Relative 10 %   Monocytes Absolute 0.5 0.1 - 1.0 K/uL   Eosinophils Relative 1 %   Eosinophils Absolute 0.1 0.0 - 0.5 K/uL   Basophils Relative 0 %   Basophils Absolute 0.0 0.0 - 0.1 K/uL   Immature Granulocytes 0 %   Abs Immature Granulocytes 0.02 0.00 - 0.07 K/uL  Lactic acid, plasma     Status: Abnormal  Collection Time: 12/29/22 10:06 AM  Result Value Ref Range   Lactic Acid, Venous 2.1 (HH) 0.5 - 1.9 mmol/L     Key medications administered in the ER:  Medications  sodium chloride 0.9 % bolus 1,000 mL (0 mLs Intravenous Stopped 12/28/22 2305)    And  0.9 %  sodium chloride infusion (1,000 mLs Intravenous New Bag/Given 12/28/22 2351)  lactated ringers infusion (1,000 mLs Intravenous New Bag/Given 12/29/22 0116)  lactated ringers 1,000 mL with potassium chloride 20 mEq infusion ( Intravenous New Bag/Given 12/29/22 0301)  QUEtiapine (SEROQUEL XR) 24 hr tablet 300 mg (has no administration in time range)  lamoTRIgine (LAMICTAL) tablet 50 mg (50 mg Oral Given 12/29/22 0842)  doxepin (SINEQUAN) capsule 25 mg (has no administration in time range)  buPROPion (WELLBUTRIN XL) 24 hr tablet 300 mg (300 mg Oral Given 12/29/22 0843)  dolutegravir (TIVICAY) tablet 50 mg (50 mg Oral Given 12/29/22 0843)  emtricitabine-rilpivir-tenofovir AF (ODEFSEY) 200-25-25 MG per tablet 1 tablet (1 tablet Oral Given 12/29/22 0843)  acetaminophen (TYLENOL) tablet 650 mg (has no administration in time range)    Or  acetaminophen (TYLENOL) suppository 650 mg (has no administration in time range)  senna-docusate (Senokot-S) tablet 1 tablet  (has no administration in time range)  sodium chloride 0.9 % bolus 500 mL (has no administration in time range)  iohexol (OMNIPAQUE) 350 MG/ML injection 75 mL (75 mLs Intravenous Contrast Given 12/28/22 2246)  lactated ringers bolus 1,000 mL (0 mLs Intravenous Stopped 12/29/22 0109)  acetaminophen (TYLENOL) tablet 1,000 mg (1,000 mg Oral Given 12/29/22 0113)    Consults: trauma surgery  Medical decision making: -Vital signs show hypotension. Patient afebrile and non-toxic appearing. -Patient's presentation is most consistent with acute presentation with potential threat to life or bodily function.. Jo Bolhuis is a 57 y.o. female presenting to the emergency department with hypotension and a fall.  -Additional history obtained from EMS -Per chart review, patient takes Xarelto. -Patient arrives as a level 1 trauma she has an intact airway and bilateral breath sounds.  She is hypotensive.  IV access has been obtained bilaterally and IV fluids initiated.  Patient has had improvement in her maps greater than 65.  Proceeded to CT scans which do not show evidence of acute traumatic abnormality.  CBC is without anemia.  CMP demonstrates baseline kidney function.  Lactic acid is mildly elevated to 2.3.  Currently pending cardiac workup with troponin due to syncopal episode and hypotension.  Patient persistently hypotensive therefore given second fluid bolus.  Overall lower suspicion for traumatic etiology and more concern for medical cause.  Polypharmacy is on the differential as well as dehydration.  Plan at signout is to follow-up remainder of workup and reassess. Pt care was handed off to oncoming team at 2330.  Complete history and physical and current plan have been communicated.  Please refer to their note for the remainder of ED care and ultimate disposition.     Medical Decision Making Amount and/or Complexity of Data Reviewed Labs: ordered. Radiology: ordered.  Risk OTC drugs. Prescription  drug management. Decision regarding hospitalization.     The plan for this patient was discussed with Dr. Jodi Mourning, who voiced agreement and who oversaw evaluation and treatment of this patient.  Marta Lamas, MD Emergency Medicine, PGY-3  Note: Dragon medical dictation software was used in the creation of this note.   Clinical Impression:  1. Orthostatic hypotension   2. Dehydration          Chase Caller,  MD 12/29/22 1138    Blane Ohara, MD 12/31/22 [phone]

## 2022-12-28 NOTE — ED Provider Notes (Signed)
ATTENDING SUPERVISORY NOTE I have personally viewed the imaging studies performed. I have personally seen and examined the patient, and discussed the plan of care with the resident.  I have reviewed the documentation of the resident and agree.  No diagnosis found.   .Critical Care  Performed by: Blane Ohara, MD Authorized by: Blane Ohara, MD   Critical care provider statement:    Critical care time (minutes):  35   Critical care start time:  12/28/2022 10:45 PM   Critical care end time:  12/28/2022 11:15 PM   Critical care time was exclusive of:  Teaching time and separately billable procedures and treating other patients   Critical care was time spent personally by me on the following activities:  Development of treatment plan with patient or surrogate, discussions with consultants, evaluation of patient's response to treatment, examination of patient, ordering and review of laboratory studies, ordering and review of radiographic studies, ordering and performing treatments and interventions, pulse oximetry, re-evaluation of patient's condition and review of old charts     Blane Ohara, MD 12/31/22 [phone]

## 2022-12-28 NOTE — Progress Notes (Signed)
Orthopedic Tech Progress Note Patient Details:  Tracy [name] 27-Jun-1966 [id number]  Patient ID: [name], female   DOB: 1966/02/17, 57 y.o.   MRN: [id number] I attended trauma page. Trinna Post 12/28/2022, 10:40 PM

## 2022-12-28 NOTE — Consult Note (Signed)
Activation and Reason: level I, ground level fall  Primary Survey: airway intact, breath sounds present bilaterally, hypotensive, pulses intact  Tracy [name] is an 57 y.o. female.  HPI: 57 yo female fell over onto carpet but hit her head. He had loss of consciousness for 2 minutes and came back but confused. She takes seroquel and sometimes can get sleepy at night but tonight seems different to family.  Past Medical History:  Diagnosis Date   Arthritis    Chronic back pain    Chronic headaches    Depression    HIV infection (HCC)    Joint pain    Localized swelling of both lower legs    Legs, feet and hands   Pre-diabetes    Pulmonary embolism (HCC)    on Xarelto   Schizoaffective disorder (HCC)    Seasonal allergies    Stroke (cerebrum) (HCC)    patient denies, states it was a heat stroke   Weight loss     Past Surgical History:  Procedure Laterality Date   ABDOMINAL EXPOSURE N/A 07/27/2018   Procedure: ABDOMINAL EXPOSURE;  Surgeon: Nada Libman, MD;  Location: [facility] OR;  Service: Vascular;  Laterality: N/A;   ABDOMINAL HYSTERECTOMY     ANTERIOR LUMBAR FUSION N/A 07/27/2018   Procedure: ANTERIOR LUMBAR FUSION L5-S1;  Surgeon: Venita Lick, MD;  Location: [facility] OR;  Service: Orthopedics;  Laterality: N/A;  3 hrs/ Dr. Myra Gianotti to do approach HIV Positive   BREAST EXCISIONAL BIOPSY Right 1990's   BREAST SURGERY     TRANSFORAMINAL LUMBAR INTERBODY FUSION (TLIF) WITH PEDICLE SCREW FIXATION 1 LEVEL N/A 10/02/2021   Procedure: TRANSFORAMINAL LUMBAR INTERBODY FUSION LUMBAR FOUR THROUGH FIVE;  Surgeon: Venita Lick, MD;  Location: [facility] OR;  Service: Orthopedics;  Laterality: N/A;    Family History  Problem Relation Age of Onset   Diabetes Mother    Hypertension Mother    Tremor Mother    Breast cancer Maternal Aunt    High blood pressure Sister    High blood pressure Maternal Uncle    Diabetes Maternal Uncle     Social History:  reports that she quit smoking about 6  years ago. Her smoking use included cigarettes. She smoked an average of .3 packs per day. She has never used smokeless tobacco. She reports current drug use. Drug: "Crack" cocaine. She reports that she does not drink alcohol.  Allergies:  Allergies  Allergen Reactions   Pork-Derived Products Other (See Comments)    Religious practice   Flexeril [Cyclobenzaprine] Rash   Moxifloxacin Rash   Tetracyclines & Related Itching   Tramadol Itching    Medications: I have reviewed the patient's current medications.  Results for orders placed or performed during the hospital encounter of 12/28/22 (from the past 48 hour(s))  Ethanol     Status: None   Collection Time: 12/28/22 10:23 PM  Result Value Ref Range   Alcohol, Ethyl (B) <10 <10 mg/dL    Comment: (NOTE) Lowest detectable limit for serum alcohol is 10 mg/dL.  For medical purposes only. Performed at [facility], [address], [place], [place] [address]   Protime-INR     Status: Abnormal   Collection Time: 12/28/22 10:23 PM  Result Value Ref Range   Prothrombin Time 17.3 (H) 11.4 - 15.2 seconds   INR 1.4 (H) 0.8 - 1.2    Comment: (NOTE) INR goal varies based on device and disease states. Performed at [facility], [address] [place], [place] [address]   Sample to Blood Bank     Status: None   Collection Time: 12/28/22 10:23 PM  Result Value Ref Range   Blood Bank Specimen SAMPLE AVAILABLE FOR TESTING    Sample Expiration      12/31/2022,2359 Performed at [facility], [address], [place], [place] [address]   CBC     Status: None   Collection Time: 12/28/22 10:23 PM  Result Value Ref Range   WBC 5.6 4.0 - 10.5 K/uL   RBC 4.04 3.87 - 5.11 MIL/uL   Hemoglobin 12.6 12.0 - 15.0 g/dL   HCT 56.2 13.0 - 86.5 %   MCV 95.5 80.0 - 100.0 fL   MCH 31.2 26.0 - 34.0 pg   MCHC 32.6 30.0 - 36.0 g/dL   RDW 78.4 69.6 - 29.5 %   Platelets 263 150 - 400 K/uL   nRBC 0.0 0.0 - 0.2 %    Comment: Performed at  [facility], [address], [place], [place] [address]  I-Stat Chem 8, ED     Status: Abnormal   Collection Time: 12/28/22 10:24 PM  Result Value Ref Range   Sodium 141 135 - 145 mmol/L   Potassium 4.9 3.5 - 5.1 mmol/L   Chloride 108 98 - 111 mmol/L   BUN 21 (H) 6 - 20 mg/dL   Creatinine, Ser 2.44 (H) 0.44 - 1.00 mg/dL   Glucose, Bld 010 (H) 70 - 99 mg/dL    Comment: Glucose reference range applies only to samples taken after fasting for at least 8 hours.   Calcium, Ion 1.11 (L) 1.15 - 1.40 mmol/L   TCO2 25 22 - 32 mmol/L   Hemoglobin 12.9 12.0 - 15.0 g/dL   HCT 27.2 53.6 - 64.4 %    DG Pelvis Portable  Result Date: 12/28/2022 CLINICAL DATA:  Recent fall with pelvic pain, initial encounter EXAM: PORTABLE PELVIS 1 VIEWS COMPARISON:  None Available. FINDINGS: Pelvic ring is intact. Postsurgical changes in the lower lumbar spine are noted. No acute fracture is seen. No soft tissue changes are noted. IMPRESSION: No acute abnormality noted. Electronically Signed   By: Alcide Clever M.D.   On: 12/28/2022 22:35   DG Chest Port 1 View  Result Date: 12/28/2022 CLINICAL DATA:  Recent fall with chest pain, initial encounter EXAM: PORTABLE CHEST 1 VIEW COMPARISON:  12/09/2022 FINDINGS: Cardiac shadow is stable. Inspiratory effort is poor. No infiltrate or pneumothorax is seen. Minimal left basilar atelectasis is noted. No bony abnormality is seen. IMPRESSION: Minimal left basilar atelectasis. No other focal abnormality is noted. Electronically Signed   By: Alcide Clever M.D.   On: 12/28/2022 22:34    ROS  PE Blood pressure 93/62, pulse 79, temperature 97.8 F (36.6 C), temperature source Oral, resp. rate 10, height 5\' 8"  (1.727 m), weight 88.5 kg, SpO2 100 %. Constitutional: NAD; conversant; no deformities Eyes: Moist conjunctiva; no lid lag; anicteric; PERRL Neck: Trachea midline; no thyromegaly, no cervicalgia Lungs: Normal respiratory effort; no tactile fremitus CV: RRR; no palpable  thrills; no pitting edema GI: Abd soft, NT; no palpable hepatosplenomegaly MSK: unable to assess gait; no clubbing/cyanosis Psychiatric: Appropriate affect; somnolent but arousable, asked simple questions Lymphatic: No palpable cervical or axillary lymphadenopathy   Assessment/Plan: 57 yo female with ground level fall on blood thinners -CT HCCAP negative for bleed, no external signs of hematoma to think hemorrhagic shock.  -Worsened CKD which is newer diagnosis -fluid resuscitation -hypotension more medical,  trauma is available for concerns  Procedures: none  I reviewed last 24 h vitals and pain scores, last 48 h intake and output, last 24 h labs and trends, and last 24 h imaging results.  This care required high  level of medical decision making.   De Blanch Pepper Wyndham 12/28/2022, 11:17 PM

## 2022-12-29 ENCOUNTER — Observation Stay (HOSPITAL_COMMUNITY): Payer: Medicaid Other

## 2022-12-29 DIAGNOSIS — F250 Schizoaffective disorder, bipolar type: Secondary | ICD-10-CM

## 2022-12-29 DIAGNOSIS — R42 Dizziness and giddiness: Secondary | ICD-10-CM

## 2022-12-29 DIAGNOSIS — B20 Human immunodeficiency virus [HIV] disease: Secondary | ICD-10-CM

## 2022-12-29 DIAGNOSIS — I951 Orthostatic hypotension: Secondary | ICD-10-CM

## 2022-12-29 DIAGNOSIS — R55 Syncope and collapse: Secondary | ICD-10-CM | POA: Diagnosis not present

## 2022-12-29 DIAGNOSIS — N179 Acute kidney failure, unspecified: Secondary | ICD-10-CM

## 2022-12-29 DIAGNOSIS — N1831 Chronic kidney disease, stage 3a: Secondary | ICD-10-CM

## 2022-12-29 DIAGNOSIS — F17210 Nicotine dependence, cigarettes, uncomplicated: Secondary | ICD-10-CM

## 2022-12-29 LAB — URINALYSIS, ROUTINE W REFLEX MICROSCOPIC
Bacteria, UA: NONE SEEN
Bilirubin Urine: NEGATIVE
Glucose, UA: NEGATIVE mg/dL
Hgb urine dipstick: NEGATIVE
Ketones, ur: NEGATIVE mg/dL
Nitrite: NEGATIVE
Protein, ur: NEGATIVE mg/dL
Specific Gravity, Urine: >1.046 — ABNORMAL HIGH (ref 1.005–1.030)
pH: 5 (ref 5.0–8.0)

## 2022-12-29 LAB — RESP PANEL BY RT-PCR (RSV, FLU A&B, COVID)  RVPGX2
Influenza A by PCR: NEGATIVE
Influenza B by PCR: NEGATIVE
Resp Syncytial Virus by PCR: NEGATIVE
SARS Coronavirus 2 by RT PCR: NEGATIVE

## 2022-12-29 LAB — COMPREHENSIVE METABOLIC PANEL
ALT: 14 U/L (ref 0–44)
ALT: 14 U/L (ref 0–44)
AST: 17 U/L (ref 15–41)
AST: 19 U/L (ref 15–41)
Albumin: 3.1 g/dL — ABNORMAL LOW (ref 3.5–5.0)
Albumin: 3.4 g/dL — ABNORMAL LOW (ref 3.5–5.0)
Alkaline Phosphatase: 76 U/L (ref 38–126)
Alkaline Phosphatase: 79 U/L (ref 38–126)
Anion gap: 11 (ref 5–15)
Anion gap: 6 (ref 5–15)
BUN: 13 mg/dL (ref 6–20)
BUN: 14 mg/dL (ref 6–20)
CO2: 20 mmol/L — ABNORMAL LOW (ref 22–32)
CO2: 21 mmol/L — ABNORMAL LOW (ref 22–32)
Calcium: 8.4 mg/dL — ABNORMAL LOW (ref 8.9–10.3)
Calcium: 8.5 mg/dL — ABNORMAL LOW (ref 8.9–10.3)
Chloride: 108 mmol/L (ref 98–111)
Chloride: 111 mmol/L (ref 98–111)
Creatinine, Ser: 1.34 mg/dL — ABNORMAL HIGH (ref 0.44–1.00)
Creatinine, Ser: 1.6 mg/dL — ABNORMAL HIGH (ref 0.44–1.00)
GFR, Estimated: 38 mL/min — ABNORMAL LOW (ref >60)
GFR, Estimated: 47 mL/min — ABNORMAL LOW (ref >60)
Glucose, Bld: 110 mg/dL — ABNORMAL HIGH (ref 70–99)
Glucose, Bld: 157 mg/dL — ABNORMAL HIGH (ref 70–99)
Potassium: 3.2 mmol/L — ABNORMAL LOW (ref 3.5–5.1)
Potassium: 3.8 mmol/L (ref 3.5–5.1)
Sodium: 138 mmol/L (ref 135–145)
Sodium: 139 mmol/L (ref 135–145)
Total Bilirubin: 0.4 mg/dL (ref 0.3–1.2)
Total Bilirubin: 0.4 mg/dL (ref 0.3–1.2)
Total Protein: 5.2 g/dL — ABNORMAL LOW (ref 6.5–8.1)
Total Protein: 5.5 g/dL — ABNORMAL LOW (ref 6.5–8.1)

## 2022-12-29 LAB — CBC WITH DIFFERENTIAL/PLATELET
Abs Immature Granulocytes: 0.02 10*3/uL (ref 0.00–0.07)
Basophils Absolute: 0 10*3/uL (ref 0.0–0.1)
Basophils Relative: 0 %
Eosinophils Absolute: 0.1 10*3/uL (ref 0.0–0.5)
Eosinophils Relative: 1 %
HCT: 32.5 % — ABNORMAL LOW (ref 36.0–46.0)
Hemoglobin: 10.4 g/dL — ABNORMAL LOW (ref 12.0–15.0)
Immature Granulocytes: 0 %
Lymphocytes Relative: 41 %
Lymphs Abs: 2.4 10*3/uL (ref 0.7–4.0)
MCH: 30.6 pg (ref 26.0–34.0)
MCHC: 32 g/dL (ref 30.0–36.0)
MCV: 95.6 fL (ref 80.0–100.0)
Monocytes Absolute: 0.5 10*3/uL (ref 0.1–1.0)
Monocytes Relative: 10 %
Neutro Abs: 2.7 10*3/uL (ref 1.7–7.7)
Neutrophils Relative %: 48 %
Platelets: 216 10*3/uL (ref 150–400)
RBC: 3.4 MIL/uL — ABNORMAL LOW (ref 3.87–5.11)
RDW: 13.5 % (ref 11.5–15.5)
WBC: 5.7 10*3/uL (ref 4.0–10.5)
nRBC: 0 % (ref 0.0–0.2)

## 2022-12-29 LAB — LACTIC ACID, PLASMA
Lactic Acid, Venous: 1 mmol/L (ref 0.5–1.9)
Lactic Acid, Venous: 2.1 mmol/L (ref 0.5–1.9)

## 2022-12-29 MED ORDER — BUPROPION HCL ER (XL) 150 MG PO TB24
300.0000 mg | ORAL_TABLET | Freq: Every morning | ORAL | Status: DC
  Administered 2022-12-29 – 2022-12-30 (×2): 300 mg via ORAL
  Filled 2022-12-29: qty 2
  Filled 2022-12-29: qty 1
  Filled 2022-12-29: qty 2

## 2022-12-29 MED ORDER — SENNOSIDES-DOCUSATE SODIUM 8.6-50 MG PO TABS: 1.0000 | ORAL_TABLET | Freq: Every evening | ORAL | Status: DC | PRN

## 2022-12-29 MED ORDER — RIVAROXABAN 10 MG PO TABS
20.0000 mg | ORAL_TABLET | Freq: Every day | ORAL | Status: DC
  Administered 2022-12-29: 20 mg via ORAL
  Filled 2022-12-29: qty 2

## 2022-12-29 MED ORDER — DOXEPIN HCL 25 MG PO CAPS
25.0000 mg | ORAL_CAPSULE | Freq: Every day | ORAL | Status: DC
  Administered 2022-12-29: 25 mg via ORAL
  Filled 2022-12-29 (×2): qty 1

## 2022-12-29 MED ORDER — DOLUTEGRAVIR SODIUM 50 MG PO TABS
50.0000 mg | ORAL_TABLET | Freq: Every day | ORAL | Status: DC
  Administered 2022-12-29 – 2022-12-30 (×2): 50 mg via ORAL
  Filled 2022-12-29 (×2): qty 1

## 2022-12-29 MED ORDER — LACTATED RINGERS IV SOLN
INTRAVENOUS | Status: DC
  Administered 2022-12-29: 1000 mL via INTRAVENOUS

## 2022-12-29 MED ORDER — QUETIAPINE FUMARATE ER 300 MG PO TB24
300.0000 mg | ORAL_TABLET | Freq: Every day | ORAL | Status: DC
  Administered 2022-12-29: 300 mg via ORAL
  Filled 2022-12-29 (×2): qty 1

## 2022-12-29 MED ORDER — ACETAMINOPHEN 650 MG RE SUPP: 650.0000 mg | Freq: Four times a day (QID) | RECTAL | Status: DC | PRN

## 2022-12-29 MED ORDER — EMTRICITAB-RILPIVIR-TENOFOV AF 200-25-25 MG PO TABS
1.0000 | ORAL_TABLET | Freq: Every day | ORAL | Status: DC
  Administered 2022-12-29 – 2022-12-30 (×2): 1 via ORAL
  Filled 2022-12-29 (×2): qty 1

## 2022-12-29 MED ORDER — LAMOTRIGINE 25 MG PO TABS
50.0000 mg | ORAL_TABLET | Freq: Every day | ORAL | Status: DC
  Administered 2022-12-29 – 2022-12-30 (×2): 50 mg via ORAL
  Filled 2022-12-29 (×2): qty 2

## 2022-12-29 MED ORDER — SODIUM CHLORIDE 0.9 % IV BOLUS
500.0000 mL | Freq: Once | INTRAVENOUS | Status: AC
  Administered 2022-12-29: 500 mL via INTRAVENOUS

## 2022-12-29 MED ORDER — ACETAMINOPHEN 325 MG PO TABS: 650.0000 mg | ORAL_TABLET | Freq: Four times a day (QID) | ORAL | Status: DC | PRN

## 2022-12-29 MED ORDER — ACETAMINOPHEN 500 MG PO TABS
1000.0000 mg | ORAL_TABLET | Freq: Once | ORAL | Status: AC
  Administered 2022-12-29: 1000 mg via ORAL
  Filled 2022-12-29: qty 2

## 2022-12-29 MED ORDER — POTASSIUM CHLORIDE 2 MEQ/ML IV SOLN
INTRAVENOUS | Status: DC
  Filled 2022-12-29 (×4): qty 1000

## 2022-12-29 NOTE — Progress Notes (Signed)
Transition of Care [facility]) - Inpatient Brief Assessment   Patient Details  Name: Tracy [name] MRN: [id number] Date of Birth: [birth year]  [facility] CM/SW Contact:    Janae Bridgeman, RN Phone Number: 12/29/2022, 9:47 AM   Clinical Narrative: No TOC needs.   Transition of Care Asessment: Insurance and Status: (P) Insurance coverage has been reviewed Patient has primary care physician: (P) Yes Home environment has been reviewed: (P) Yes   Prior/Current Home Services: (P) No current home services Social Determinants of Health Reivew: (P) SDOH reviewed no interventions necessary Readmission risk has been reviewed: (P) Yes Transition of care needs: (P) no transition of care needs at this time

## 2022-12-29 NOTE — Evaluation (Signed)
Physical Therapy Evaluation Patient Details Name: Tracy [name] MRN: [id number] DOB: May 11, 1966 Today's Date: 12/29/2022  History of Present Illness  57 year old female, arrived 6/17 via EMS after fall in which she hit her head no LoC. In ED, pt BP 81/67 which rebounded to low 100's with 2L saline bolus. Trauma imaging negative. Admitted for observation. NWG:[id number], CVA, HIV, PE.  Clinical Impression  PTA pt living with husband in 2 story town home with 1 step to enter and 12 steps to bed/bathroom. Pt reports complete independence until a week ago when she started experiencing dizziness with standing and experienced a fall, dizziness with upright continued until fall which required trip to ED. Pt is severely limited in safe mobility with positional change not related to orthostatic hypotension. On vestibular screen pt exhibits 2 beat right sided nystagmus with finger follow and can not tolerate head turns. Pt is independent with bed mobility but experiences dizziness when coming to seated. Pt with increase bracing posteriorly with coming to standing. Able to perform marching in place x 30 with gaze stabilization but experiences loss of balance requiring mod-maxA for steadying if concentration on gaze stabilization lapses. PT recommending Vestibular exam and will continue to follow acutely.        Recommendations for follow up therapy are one component of a multi-disciplinary discharge planning process, led by the attending physician.  Recommendations may be updated based on patient status, additional functional criteria and insurance authorization.     Assistance Recommended at Discharge Frequent or constant Supervision/Assistance  Patient can return home with the following  A lot of help with walking and/or transfers;A lot of help with bathing/dressing/bathroom;Assistance with cooking/housework;Assist for transportation;Help with stairs or ramp for entrance    Equipment Recommendations  Rolling walker (2 wheels)  Recommendations for Other Services  Other (comment) (Vestibular consult)    Functional Status Assessment Patient has had a recent decline in their functional status and demonstrates the ability to make significant improvements in function in a reasonable and predictable amount of time.     Precautions / Restrictions Precautions Precautions: Fall Precaution Comments: fell 2x in last week Restrictions Weight Bearing Restrictions: No      Mobility  Bed Mobility Overal bed mobility: Independent                  Transfers Overall transfer level: Needs assistance Equipment used: Rolling walker (2 wheels) Transfers: Sit to/from Stand Sit to Stand: Min assist           General transfer comment: pt with increased dizziness with coming to standing requiring posterior LE bracing on EoB as well as holding onto bed rail. Pt instructed to hold onto RW with both hands as RW elevating    Ambulation/Gait Ambulation/Gait assistance: Min guard, Max assist Gait Distance (Feet): 20 Feet Assistive device: Rolling walker (2 wheels) Gait Pattern/deviations: Step-through pattern, Narrow base of support, Knees buckling Gait velocity: slowed Gait velocity interpretation: <1.31 ft/sec, indicative of household ambulator Pre-gait activities: marching in place x 30 General Gait Details: vc for gaze stabilitation, increased BoS and improved proximity to RW, pt with very tenative steps towards door, pt concentration on door knob waivered briefly and pt with knee buckling and LoB requiring modA for steadying with ambulation back to bed similar LoB with loss of gaze concentration      Balance Overall balance assessment: Needs assistance Sitting-balance support: Feet supported, No upper extremity supported, Feet unsupported Sitting balance-Leahy Scale: Fair     Standing balance support: Bilateral upper  extremity supported, During functional activity, Reliant on  assistive device for balance Standing balance-Leahy Scale: Poor                               Pertinent Vitals/Pain Pain Assessment Pain Assessment: No/denies pain    Home Living Family/patient expects to be discharged to:: Private residence Living Arrangements: Spouse/significant other;Parent Available Help at Discharge: Family;Available 24 hours/day Type of Home: Other(Comment) (townhome) Home Access: Stairs to enter   Entrance Stairs-Number of Steps: 1 Alternate Level Stairs-Number of Steps: 12 Home Layout: Two level;Bed/bath upstairs Home Equipment: Grab bars - tub/shower;Hand held shower head      Prior Function Prior Level of Function : Independent/Modified Independent                     Hand Dominance   Dominant Hand: Right    Extremity/Trunk Assessment   Upper Extremity Assessment Upper Extremity Assessment: Defer to OT evaluation    Lower Extremity Assessment Lower Extremity Assessment: Overall WFL for tasks assessed    Cervical / Trunk Assessment Cervical / Trunk Assessment: Normal  Communication   Communication: No difficulties  Cognition Arousal/Alertness: Awake/alert Behavior During Therapy: Flat affect, Anxious Overall Cognitive Status: Within Functional Limits for tasks assessed                                 General Comments: pt is very worried about why she is dizzy and falling over the last week        General Comments General comments (skin integrity, edema, etc.): with Vestibular screen, pt exhibits 2 beats of R nystagmus with finger follow and can not tolerate head turns at all, BP steady with postional change        Assessment/Plan    PT Assessment Patient needs continued PT services  PT Problem List Decreased mobility;Other (comment);Decreased activity tolerance (Dizziness with movement)       PT Treatment Interventions DME instruction;Gait training;Stair training;Functional mobility  training;Therapeutic activities;Therapeutic exercise;Balance training;Cognitive remediation;Patient/family education    PT Goals (Current goals can be found in the Care Plan section)  Acute Rehab PT Goals Patient Stated Goal: stop being dizzy PT Goal Formulation: With patient Time For Goal Achievement: 01/12/23 Potential to Achieve Goals: Fair    Frequency Min 3X/week        AM-PAC PT "6 Clicks" Mobility  Outcome Measure Help needed turning from your back to your side while in a flat bed without using bedrails?: None Help needed moving from lying on your back to sitting on the side of a flat bed without using bedrails?: None Help needed moving to and from a bed to a chair (including a wheelchair)?: A Little Help needed standing up from a chair using your arms (e.g., wheelchair or bedside chair)?: A Little Help needed to walk in hospital room?: A Lot Help needed climbing 3-5 steps with a railing? : Total 6 Click Score: 17    End of Session Equipment Utilized During Treatment: Gait belt Activity Tolerance: Treatment limited secondary to medical complications (Comment) (profound dizziness) Patient left: in bed;with call bell/phone within reach;with bed alarm set Nurse Communication: Mobility status;Other (comment) (gaze stabilization cuing with transfers to [facility], needs assist for OOB) PT Visit Diagnosis: Dizziness and giddiness (R42);Unsteadiness on feet (R26.81)    Time: 1610-9604 PT Time Calculation (min) (ACUTE ONLY): 32 min   Charges:  PT Evaluation $PT Eval Moderate Complexity: 1 Mod PT Treatments $Therapeutic Activity: 8-22 mins        Ami Thornsberry B. Beverely Risen PT, DPT Acute Rehabilitation Services Please use secure chat or  Call Office [phone]   Elon Alas [facility] 12/29/2022, 3:26 PM

## 2022-12-29 NOTE — Progress Notes (Signed)
Attempted ECHO: patient went to MRI  Dondra Prader RVT RCS

## 2022-12-29 NOTE — Hospital Course (Signed)
Care started prior to midnight in the emergency room and patient was admitted early this morning after midnight by Dr. Gery Pray I am in current agreement with her assessment and plan.  Additional changes in the plan of care been made currently.  The patient is a 57 year old obese African-American female with past medical history significant for below 2 schizophrenia, history of CVA, HIV and PE as well as other comorbidities who presents with syncope and yesterday patient's husband found her getting really hot and very dizzy.  Patient went her husband and she is getting scared and she blocked her husband that she recalls and per the husband the patient fell and hit her head.  She is on Xarelto.  The patient's husband stated there is no loss of consciousness however patient states that she did lose consciousness given that she did immediately as she went down.  Patient does not remember going down and falling.  911 was called and she was found to be hypotensive.  In the ED her blood pressure on the lower side at 81/67 she is given 2 L normal saline boluses and blood pressure improved.  Her blood pressure remained occasionally borderline.  She is given IV fluid hydration and further workup was initiated for syncope and collapse.  Patient continues to still feel dizzy so we will obtain further imaging with an echocardiogram and an MRI of the brain.  Currently she is being admitted and treated for the following but not limited to:  Syncope and collapse/ Symptomatic orthostatic hypotension -Likely secondary to heat exhaustion to rule out other causes given that she continues to be dizzy and lactic acid level to be elevated.  No evidence of infection -2 L NS given in ER.  Continue IV fluids with potassium -Orthostatic vitals in a.m. still pending to be done and PT OT to further evaluate and treat -Check echocardiogram and MRI of the brain given that the patient still dizzy  Lactic Acidosis -Lactic Acid  Trend: Recent Labs  Lab 12/28/22 2203 12/29/22 0658 12/29/22 1006  LATICACIDVEN 2.3* 1.0 2.1*   Tobacco Abuse/Cigarette smoker -Nicotine patch ordered -Smoking Cessastion Counseling given   HIV disease (HCC) -Home medication Complera and Tivicay resumed   HTN -All BP meds on hold   Schizoaffective disorder (HCC)/Depression -Lamictal and Seroquel resumed   History of PE -Resume Xarelto    History of CVA -Xarelto  AKI on CKD Stage 3a Metabolic Acidosis -BUN/Cr Trend: Recent Labs  Lab 12/09/22 0840 12/09/22 1052 12/28/22 2224 12/28/22 2342 12/29/22 0658  BUN 19 16 21* 14 13  CREATININE 1.33* 1.26* 1.80* 1.60* 1.34*  -C/w IVF Hydration -Patient has a CO2 of 21, anion gap of 6, chloride level of 111 -Avoid Nephrotoxic Medications, Contrast Dyes, Hypotension and Dehydration to Ensure Adequate Renal Perfusion and will need to Renally Adjust Meds -Continue to Monitor and Trend Renal Function carefully and repeat CMP in the AM   Normocytic Anemia/Anemia of Chronic Kidney Disease Stage 3a -Likely was hemoconcentrated on admission and has a dilutional drop; Hgb/Hct Trend: Recent Labs  Lab 12/09/22 0840 12/09/22 1052 12/28/22 2223 12/28/22 2224 12/29/22 0816  HGB 14.0 14.0 12.6 12.9 10.4*  HCT 40.4 42.2 38.6 38.0 32.5*  MCV 90.6 94.0 95.5  --  95.6  -Check Anemia Panel in the AM -Continue to Monitor for S/Sx of Bleeding; No over bleeding noted  Hypoalbuminemia -Patient's Albumin Trend: Recent Labs  Lab 12/28/22 2342 12/29/22 0658  ALBUMIN 3.4* 3.1*  -Continue to Monitor and Trend and  repeat CMP in the AM  Obesity -Complicates overall prognosis and care -Estimated body mass index is 31.18 kg/m as calculated from the following:   Height as of this encounter: 5\' 7"  (1.702 m).   Weight as of this encounter: 90.3 kg.  -Weight Loss and Dietary Counseling given  We will continue to monitor the patient's clinical response to intervention and follow-up on the  imaging studies that have been ordered

## 2022-12-29 NOTE — TOC CAGE-AID Note (Signed)
Transition of Care [facility]) - CAGE-AID Screening   Patient Details  Name: Tracy [name] MRN: [id number] Date of Birth: 08/01/1965   Hewitt Shorts, RN Trauma Response Nurse Phone Number: [phone] 12/29/2022, 2:55 PM   Clinical Narrative:  Denies any alcohol or drug use  CAGE-AID Screening:    Have You Ever Felt You Ought to Cut Down on Your Drinking or Drug Use?: No Have People Annoyed You By Critizing Your Drinking Or Drug Use?: No Have You Felt Bad Or Guilty About Your Drinking Or Drug Use?: No Have You Ever Had a Drink or Used Drugs First Thing In The Morning to Steady Your Nerves or to Get Rid of a Hangover?: No CAGE-AID Score: 0  Substance Abuse Education Offered: No
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
## 2022-12-29 NOTE — ED Provider Notes (Signed)
  Physical Exam  BP 108/65   Pulse 79   Temp 97.9 F (36.6 C) (Rectal)   Resp 14   Ht 5\' 8"  (1.727 m)   Wt 88.5 kg   SpO2 99%   BMI 29.65 kg/m   Physical Exam Vitals and nursing note reviewed.  Constitutional:      General: She is not in acute distress.    Appearance: She is well-developed.  HENT:     Head: Normocephalic and atraumatic.  Eyes:     Conjunctiva/sclera: Conjunctivae normal.  Cardiovascular:     Rate and Rhythm: Normal rate and regular rhythm.     Heart sounds: No murmur heard. Pulmonary:     Effort: Pulmonary effort is normal. No respiratory distress.     Breath sounds: Normal breath sounds.  Abdominal:     Palpations: Abdomen is soft.     Tenderness: There is no abdominal tenderness.  Musculoskeletal:        General: No swelling.     Cervical back: Neck supple.  Skin:    General: Skin is warm and dry.     Capillary Refill: Capillary refill takes less than 2 seconds.  Neurological:     Mental Status: She is alert.  Psychiatric:        Mood and Affect: Mood normal.     Procedures  Procedures  ED Course / MDM    Medical Decision Making Amount and/or Complexity of Data Reviewed Labs: ordered. Radiology: ordered.  Risk OTC drugs. Prescription drug management.   Patient received in handoff.  Fall with hypotension pending completion of trauma imaging.  Trauma imaging reassuringly negative and pressure starting to improve with second fluid liter bolus but on my third reevaluation patient starting to drop her blood pressures again and she will require hospital observation for dehydration.  Patient placed on maintenance fluids and admitted       Glendora Score, MD 12/29/22 0100

## 2022-12-29 NOTE — Progress Notes (Signed)
Patient ID: Tracy [name] 1965-11-04  Admit date: 12/28/2022 Admitting Diagnoses: Fall and hypotension   Tertiary Survey: Trauma scans reviewed: Yes Additional Imaging recommendations: No Labs reviewed: Yes Additional lab work recommendations: No  Does the patient have any new complaints? No Cervical Collar Cleared: Yes DVT ppx ordered? No Note: dosing for DVT prophylaxis in the setting of trauma and normal renal function is 30mg  BID or 40mg  BID if >100kg or BMI>35 Is patient age > 31 (57 y.o.) : No  Exam: General: pleasant, WD, overweight female who is laying in bed in NAD HEENT: head is normocephalic, atraumatic.  Sclera are noninjected.  PERRL.  Ears and nose without any masses or lesions.  Mouth is pink and moist Heart: regular, rate, and rhythm.  Normal s1,s2. No obvious murmurs, gallops, or rubs noted.  Palpable radial and pedal pulses bilaterally Lungs: CTAB, no wheezes, rhonchi, or rales noted.  Respiratory effort nonlabored Abd: soft, NT, ND, +BS, no masses, hernias, or organomegaly MS: all 4 extremities are symmetrical with no cyanosis, clubbing, or edema. Skin: warm and dry with no masses, lesions, or rashes Neuro: Cranial nerves 2-12 grossly intact, sensation is normal throughout Psych: A&Ox3 with an appropriate affect.   Incidental Findings: A 2 mm nonobstructing right renal upper pole calculus. No hydronephrosis.   Follow-up Appointments: Per [facility] pending further workup of hypotension and dizziness  No other recommendations from a trauma standpoint, tertiary survey completed. We will sign off, please call if we can be of further assistance.   Juliet Rude, [facility] 12/29/2022, 10:49 AM Please see Amion for pager number during day hours 7:00am-4:30pm

## 2022-12-29 NOTE — Plan of Care (Signed)

## 2022-12-29 NOTE — H&P (Signed)
PCP:   Lindaann Pascal, PA-C   Chief Complaint:  Syncope and collapse  HPI: This is a 57 year old female with past medical history schizophrenia, CVA, HIV, PE.  Today patient found her still getting really hot and after while very dizzy.  She went to her husband because she was getting scared.  She bumped into her husband and that is all she recalls.  Per husband the patient fell and hit her head.  She is on Xarelto.  Husband states there was no loss of consciousness as she responded to them almost as soon as she went down.  But patient does not remember going down.  911 was called.  Patient found to be hypotensive  On presentation to the ER patient initial blood pressure was 81/67.  She was given 2 L normal saline bolus, her blood pressure responded with SBP's in the low 100.  However, her blood pressure remains borderline occasional in the high 90s, and patient still with some dizziness.  Overnight hospitalization and continued hydration requested.  Viral panel, UA, chest x-ray negative.  WBC normal.  Lactic acid 2.3  Review of Systems:  Per HPI  Past Medical History: Past Medical History:  Diagnosis Date   Arthritis    Chronic back pain    Chronic headaches    Depression    HIV infection (HCC)    Joint pain    Localized swelling of both lower legs    Legs, feet and hands   Pre-diabetes    Pulmonary embolism (HCC)    on Xarelto   Schizoaffective disorder (HCC)    Seasonal allergies    Stroke (cerebrum) (HCC)    patient denies, states it was a heat stroke   Weight loss    Past Surgical History:  Procedure Laterality Date   ABDOMINAL EXPOSURE N/A 07/27/2018   Procedure: ABDOMINAL EXPOSURE;  Surgeon: Nada Libman, MD;  Location: [facility] OR;  Service: Vascular;  Laterality: N/A;   ABDOMINAL HYSTERECTOMY     ANTERIOR LUMBAR FUSION N/A 07/27/2018   Procedure: ANTERIOR LUMBAR FUSION L5-S1;  Surgeon: Venita Lick, MD;  Location: [facility] OR;  Service: Orthopedics;  Laterality: N/A;  3 hrs/ Dr.  Myra Gianotti to do approach HIV Positive   BREAST EXCISIONAL BIOPSY Right 1990's   BREAST SURGERY     TRANSFORAMINAL LUMBAR INTERBODY FUSION (TLIF) WITH PEDICLE SCREW FIXATION 1 LEVEL N/A 10/02/2021   Procedure: TRANSFORAMINAL LUMBAR INTERBODY FUSION LUMBAR FOUR THROUGH FIVE;  Surgeon: Venita Lick, MD;  Location: [facility] OR;  Service: Orthopedics;  Laterality: N/A;    Medications: Prior to Admission medications   Medication Sig Start Date End Date Taking? Authorizing Provider  amLODipine (NORVASC) 5 MG tablet Take 5 mg by mouth daily. 01/30/22   [provider]  buPROPion (WELLBUTRIN XL) 150 MG 24 hr tablet Take 150 mg by mouth every morning. 07/31/21   [provider]  dolutegravir (TIVICAY) 50 MG tablet Take 1 tablet (50 mg total) by mouth daily. 06/15/22 06/10/23  Kathlynn Grate, DO  doxepin (SINEQUAN) 25 MG capsule Take 1 capsule (25 mg total) by mouth at bedtime. 12/23/22   Veryl Speak, FNP  emtricitabine-rilpivir-tenofovir DF (COMPLERA) 200-25-300 MG tablet Take 1 tablet by mouth daily. 06/15/22 06/10/23  Kathlynn Grate, DO  gabapentin (NEURONTIN) 300 MG capsule Take 1 capsule (300 mg total) by mouth 3 (three) times daily as needed for up to 14 days. 10/04/21 02/25/22  Dion Saucier D, PA  lamoTRIgine (LAMICTAL) 25 MG tablet Take 2 tablets (50  mg total) by mouth daily. 12/23/22   Veryl Speak, FNP  olmesartan (BENICAR) 20 MG tablet Take 20 mg by mouth daily. 12/09/22   [provider]  QUEtiapine (SEROQUEL XR) 300 MG 24 hr tablet Take 1 tablet (300 mg total) by mouth at bedtime. 12/23/22   Veryl Speak, FNP  rivaroxaban (XARELTO) 20 MG TABS tablet Take 20 mg by mouth at bedtime. 07/10/20   [provider]    Allergies:   Allergies  Allergen Reactions   Pork-Derived Products Other (See Comments)    Religious practice   Flexeril [Cyclobenzaprine] Rash   Moxifloxacin Rash   Tetracyclines & Related Itching   Tramadol Itching    Social  History:  reports that she quit smoking about 6 years ago. Her smoking use included cigarettes. She smoked an average of .3 packs per day. She has never used smokeless tobacco. She reports current drug use. Drug: "Crack" cocaine. She reports that she does not drink alcohol.  Family History: Family History  Problem Relation Age of Onset   Diabetes Mother    Hypertension Mother    Tremor Mother    Breast cancer Maternal Aunt    High blood pressure Sister    High blood pressure Maternal Uncle    Diabetes Maternal Uncle     Physical Exam: Vitals:   12/29/22 0000 12/29/22 0030 12/29/22 0045 12/29/22 0118  BP: 94/65 101/68 108/65 103/69  Pulse: 72 74 79 82  Resp: 16 13 14 14   Temp:      TempSrc:      SpO2: 100% 100% 99% 99%  Weight:      Height:        General:  Alert and oriented times three, well developed and nourished, no acute distress Eyes: Pink conjunctiva, no scleral icterus ENT: Moist oral mucosa, neck supple, no thyromegaly Lungs: clear to ascultation, no wheeze, no crackles, no use of accessory muscles Cardiovascular: regular rate and rhythm, no regurgitation, no gallops, no murmurs. No carotid bruits, no JVD Abdomen: soft, positive BS, non-tender, non-distended, no organomegaly, not an acute abdomen GU: not examined Neuro: CN II - XII grossly intact, sensation intact Musculoskeletal: strength 5/5 all extremities, no clubbing, cyanosis or edema Skin: no rash, no subcutaneous crepitation, no decubitus Psych: appropriate patient   Labs on Admission:  Recent Labs    12/28/22 2224 12/28/22 2342  NA 141 139  K 4.9 3.2*  CL 108 108  CO2  --  20*  GLUCOSE 114* 157*  BUN 21* 14  CREATININE 1.80* 1.60*  CALCIUM  --  8.4*   Recent Labs    12/28/22 2342  AST 19  ALT 14  ALKPHOS 76  BILITOT 0.4  PROT 5.5*  ALBUMIN 3.4*    Recent Labs    12/28/22 2223 12/28/22 2224  WBC 5.6  --   HGB 12.6 12.9  HCT 38.6 38.0  MCV 95.5  --   PLT 263  --     Micro  Results: Recent Results (from the past 240 hour(s))  Resp panel by RT-PCR (RSV, Flu A&B, Covid) Anterior Nasal Swab     Status: None   Collection Time: 12/28/22 10:20 PM   Specimen: Anterior Nasal Swab  Result Value Ref Range Status   SARS Coronavirus 2 by RT PCR NEGATIVE NEGATIVE Final   Influenza A by PCR NEGATIVE NEGATIVE Final   Influenza B by PCR NEGATIVE NEGATIVE Final    Comment: (NOTE) The Xpert Xpress SARS-CoV-2/FLU/RSV plus assay is intended  as an aid in the diagnosis of influenza from Nasopharyngeal swab specimens and should not be used as a sole basis for treatment. Nasal washings and aspirates are unacceptable for Xpert Xpress SARS-CoV-2/FLU/RSV testing.  Fact Sheet for Patients: BloggerCourse.com  Fact Sheet for Healthcare Providers: SeriousBroker.it  This test is not yet approved or cleared by the [place] FDA and has been authorized for detection and/or diagnosis of SARS-CoV-2 by FDA under an Emergency Use Authorization (EUA). This EUA will remain in effect (meaning this test can be used) for the duration of the COVID-19 declaration under Section 564(b)(1) of the Act, 21 U.S.C. section 360bbb-3(b)(1), unless the authorization is terminated or revoked.     Resp Syncytial Virus by PCR NEGATIVE NEGATIVE Final    Comment: (NOTE) Fact Sheet for Patients: BloggerCourse.com  Fact Sheet for Healthcare Providers: SeriousBroker.it  This test is not yet approved or cleared by the [place] FDA and has been authorized for detection and/or diagnosis of SARS-CoV-2 by FDA under an Emergency Use Authorization (EUA). This EUA will remain in effect (meaning this test can be used) for the duration of the COVID-19 declaration under Section 564(b)(1) of the Act, 21 U.S.C. section 360bbb-3(b)(1), unless the authorization is terminated or revoked.  Performed at [facility], [address], [place], [place] [address]      Radiological Exams on Admission: CT HEAD WO CONTRAST  Result Date: 12/29/2022 CLINICAL DATA:  Trauma. EXAM: CT HEAD WITHOUT CONTRAST CT CERVICAL SPINE WITHOUT CONTRAST TECHNIQUE: Multidetector CT imaging of the head and cervical spine was performed following the standard protocol without intravenous contrast. Multiplanar CT image reconstructions of the cervical spine were also generated. RADIATION DOSE REDUCTION: This exam was performed according to the departmental dose-optimization program which includes automated exposure control, adjustment of the mA and/or kV according to patient size and/or use of iterative reconstruction technique. COMPARISON:  Head CT dated 12/24/2021. FINDINGS: CT HEAD FINDINGS Brain: The ventricles and sulci are appropriate size for the patient's age. The gray-white matter discrimination is preserved. There is no acute intracranial hemorrhage. No mass effect or midline shift. No extra-axial fluid collection. Vascular: No hyperdense vessel or unexpected calcification. Skull: Normal. Negative for fracture or focal lesion. Sinuses/Orbits: No acute finding. Other: None CT CERVICAL SPINE FINDINGS Alignment: No acute subluxation. Skull base and vertebrae: No acute fracture. Soft tissues and spinal canal: No prevertebral fluid or swelling. No visible canal hematoma. Disc levels:  No acute findings.  Mild degenerative changes. Upper chest: Negative. Other: None IMPRESSION: 1. No acute intracranial pathology. 2. No acute/traumatic cervical spine pathology. These results were called by telephone at the time of interpretation on 12/28/2022 at 10:45 pm to Dr. Sheliah Hatch, who verbally acknowledged these results. Electronically Signed   By: Elgie Collard M.D.   On: 12/29/2022 00:14   CT CERVICAL SPINE WO CONTRAST  Result Date: 12/29/2022 CLINICAL DATA:  Trauma. EXAM: CT HEAD WITHOUT CONTRAST CT CERVICAL SPINE WITHOUT CONTRAST  TECHNIQUE: Multidetector CT imaging of the head and cervical spine was performed following the standard protocol without intravenous contrast. Multiplanar CT image reconstructions of the cervical spine were also generated. RADIATION DOSE REDUCTION: This exam was performed according to the departmental dose-optimization program which includes automated exposure control, adjustment of the mA and/or kV according to patient size and/or use of iterative reconstruction technique. COMPARISON:  Head CT dated 12/24/2021. FINDINGS: CT HEAD FINDINGS Brain: The ventricles and sulci are appropriate size for the patient's age. The gray-white matter discrimination is preserved. There  is no acute intracranial hemorrhage. No mass effect or midline shift. No extra-axial fluid collection. Vascular: No hyperdense vessel or unexpected calcification. Skull: Normal. Negative for fracture or focal lesion. Sinuses/Orbits: No acute finding. Other: None CT CERVICAL SPINE FINDINGS Alignment: No acute subluxation. Skull base and vertebrae: No acute fracture. Soft tissues and spinal canal: No prevertebral fluid or swelling. No visible canal hematoma. Disc levels:  No acute findings.  Mild degenerative changes. Upper chest: Negative. Other: None IMPRESSION: 1. No acute intracranial pathology. 2. No acute/traumatic cervical spine pathology. These results were called by telephone at the time of interpretation on 12/28/2022 at 10:45 pm to Dr. Sheliah Hatch, who verbally acknowledged these results. Electronically Signed   By: Elgie Collard M.D.   On: 12/29/2022 00:14   CT CHEST ABDOMEN PELVIS W CONTRAST  Result Date: 12/28/2022 CLINICAL DATA:  Trauma. EXAM: CT CHEST, ABDOMEN, AND PELVIS WITH CONTRAST TECHNIQUE: Multidetector CT imaging of the chest, abdomen and pelvis was performed following the standard protocol during bolus administration of intravenous contrast. RADIATION DOSE REDUCTION: This exam was performed according to the departmental  dose-optimization program which includes automated exposure control, adjustment of the mA and/or kV according to patient size and/or use of iterative reconstruction technique. CONTRAST:  75mL OMNIPAQUE IOHEXOL 350 MG/ML SOLN COMPARISON:  Chest CT dated 12/09/2022. FINDINGS: CT CHEST FINDINGS Cardiovascular: There is no cardiomegaly. Probable trace pericardial effusion. The thoracic aorta is unremarkable. The origins of the great vessels of the aortic arch and the central pulmonary arteries appear patent as visualized. Mediastinum/Nodes: No hilar or mediastinal adenopathy. Similar appearance of fluid density structure inferior to the right pulmonary vein, likely a duplicated cyst or pericardial recess. The esophagus and thyroid gland are grossly unremarkable. No mediastinal fluid collection. Lungs/Pleura: Bibasilar subpleural atelectasis/scarring. Minimal biapical subpleural blebs. No focal consolidation, pleural effusion, or pneumothorax. The central airways are patent. Musculoskeletal: No acute osseous pathology. CT ABDOMEN PELVIS FINDINGS No intra-abdominal free air or free fluid. Hepatobiliary: No focal liver abnormality is seen. No gallstones, gallbladder wall thickening, or biliary dilatation. Pancreas: Unremarkable. No pancreatic ductal dilatation or surrounding inflammatory changes. Spleen: Normal in size without focal abnormality. Adrenals/Urinary Tract: The adrenal glands are unremarkable. There is a 2 mm nonobstructing right renal upper pole calculus. No hydronephrosis. The left kidney is unremarkable. There is symmetric enhancement and excretion of contrast by both kidneys. The visualized ureters appear unremarkable. The urinary bladder is collapsed. Stomach/Bowel: There is no bowel obstruction or active inflammation. The appendix is normal. Vascular/Lymphatic: Mild aortoiliac atherosclerotic disease. The IVC is unremarkable. No portal venous gas. There is no adenopathy. Reproductive: Hysterectomy.  No  adnexal masses. Other: Small fat containing umbilical hernia. Musculoskeletal: L4-L5 disc spacer and posterior fusion. L5-S1 anterior fusion. No acute osseous pathology. IMPRESSION: 1. No acute/traumatic intrathoracic, abdominal, or pelvic pathology. 2. A 2 mm nonobstructing right renal upper pole calculus. No hydronephrosis. 3.  Aortic Atherosclerosis (ICD10-I70.0). Electronically Signed   By: Elgie Collard M.D.   On: 12/28/2022 23:16   DG Pelvis Portable  Result Date: 12/28/2022 CLINICAL DATA:  Recent fall with pelvic pain, initial encounter EXAM: PORTABLE PELVIS 1 VIEWS COMPARISON:  None Available. FINDINGS: Pelvic ring is intact. Postsurgical changes in the lower lumbar spine are noted. No acute fracture is seen. No soft tissue changes are noted. IMPRESSION: No acute abnormality noted. Electronically Signed   By: Alcide Clever M.D.   On: 12/28/2022 22:35   DG Chest Port 1 View  Result Date: 12/28/2022 CLINICAL DATA:  Recent fall with chest  pain, initial encounter EXAM: PORTABLE CHEST 1 VIEW COMPARISON:  12/09/2022 FINDINGS: Cardiac shadow is stable. Inspiratory effort is poor. No infiltrate or pneumothorax is seen. Minimal left basilar atelectasis is noted. No bony abnormality is seen. IMPRESSION: Minimal left basilar atelectasis. No other focal abnormality is noted. Electronically Signed   By: Alcide Clever M.D.   On: 12/28/2022 22:34    Assessment/Plan Present on Admission:  Syncope and collapse/ Symptomatic orthostatic hypotension -Likely secondary to heat exhaustion.  No evidence of infection -2 L NS given in ER.  Continue IV fluids with potassium -Orthostatic vitals in a.m. -Likely discharge in a.m.   Cigarette smoker -Nicotine patch ordered   HIV disease (HCC) -Home medication Complera and Tivicay resumed  HTN -All BP meds on hold   Schizoaffective disorder (HCC)/ Depression -Lamictal and Seroquel resumed  History of PE -Resume Xarelto tomorrow   History of  CVA -Xarelto  Tracy [name] 12/29/2022, 1:19 AM

## 2022-12-29 NOTE — ED Notes (Signed)
ED TO INPATIENT HANDOFF REPORT  ED Nurse Name and Phone #: Raquel Sarna [phone]  S Name/Age/Gender Tracy [name] 57 y.o. female Room/Bed: TRABC/TRABC  Code Status   Code Status: Full Code  Home/SNF/Other Home Patient oriented to: self, place, time, and situation Is this baseline? Yes   Triage Complete: Triage complete  Chief Complaint Orthostatic hypotension [I95.1]  Triage Note No notes on file   Allergies Allergies  Allergen Reactions   Pork-Derived Products Other (See Comments)    Religious practice   Flexeril [Cyclobenzaprine] Rash   Moxifloxacin Rash   Tetracyclines & Related Itching   Tramadol Itching    Level of Care/Admitting Diagnosis ED Disposition     ED Disposition  Admit   Condition  --   Comment  Hospital Area: [facility] [[id number]]  Level of Care: Telemetry Medical [104]  May place patient in observation at [facility] or [facility] if equivalent level of care is available:: No  Covid Evaluation: Confirmed COVID Negative  Diagnosis: Orthostatic hypotension [458.0.ICD-9-CM]  Admitting Physician: Gery Pray [[id number]]  Attending Physician: Gery Pray [[id number]]          B Medical/Surgery History Past Medical History:  Diagnosis Date   Arthritis    Chronic back pain    Chronic headaches    Depression    HIV infection (HCC)    Joint pain    Localized swelling of both lower legs    Legs, feet and hands   Pre-diabetes    Pulmonary embolism (HCC)    on Xarelto   Schizoaffective disorder (HCC)    Seasonal allergies    Stroke (cerebrum) (HCC)    patient denies, states it was a heat stroke   Weight loss    Past Surgical History:  Procedure Laterality Date   ABDOMINAL EXPOSURE N/A 07/27/2018   Procedure: ABDOMINAL EXPOSURE;  Surgeon: Nada Libman, MD;  Location: [facility] OR;  Service: Vascular;  Laterality: N/A;   ABDOMINAL HYSTERECTOMY     ANTERIOR LUMBAR FUSION N/A 07/27/2018   Procedure: ANTERIOR LUMBAR FUSION  L5-S1;  Surgeon: Venita Lick, MD;  Location: [facility] OR;  Service: Orthopedics;  Laterality: N/A;  3 hrs/ Dr. Myra Gianotti to do approach HIV Positive   BREAST EXCISIONAL BIOPSY Right 1990's   BREAST SURGERY     TRANSFORAMINAL LUMBAR INTERBODY FUSION (TLIF) WITH PEDICLE SCREW FIXATION 1 LEVEL N/A 10/02/2021   Procedure: TRANSFORAMINAL LUMBAR INTERBODY FUSION LUMBAR FOUR THROUGH FIVE;  Surgeon: Venita Lick, MD;  Location: [facility] OR;  Service: Orthopedics;  Laterality: N/A;     A IV Location/Drains/Wounds Patient Lines/Drains/Airways Status     Active Line/Drains/Airways     Name Placement date Placement time Site Days   Peripheral IV 12/28/22 18 G Left Antecubital 12/28/22  2210  Antecubital  1   Peripheral IV 12/28/22 20 G Right Antecubital 12/28/22  2235  Antecubital  1            Intake/Output Last 24 hours  Intake/Output Summary (Last 24 hours) at 12/29/2022 0447 Last data filed at 12/29/2022 0109 Gross per 24 hour  Intake 1000 ml  Output --  Net 1000 ml    Labs/Imaging Results for orders placed or performed during the hospital encounter of 12/28/22 (from the past 48 hour(s))  Lactic acid, plasma     Status: Abnormal   Collection Time: 12/28/22 10:03 PM  Result Value Ref Range   Lactic Acid, Venous 2.3 (HH) 0.5 - 1.9 mmol/L    Comment: CRITICAL RESULT CALLED TO,  READ BACK BY AND VERIFIED WITH Maia Plan, RN. [phone] 12/28/22. LPAIT Performed at [facility], [address], [place], [place] [address]   Resp panel by RT-PCR (RSV, Flu A&B, Covid) Anterior Nasal Swab     Status: None   Collection Time: 12/28/22 10:20 PM   Specimen: Anterior Nasal Swab  Result Value Ref Range   SARS Coronavirus 2 by RT PCR NEGATIVE NEGATIVE   Influenza A by PCR NEGATIVE NEGATIVE   Influenza B by PCR NEGATIVE NEGATIVE    Comment: (NOTE) The Xpert Xpress SARS-CoV-2/FLU/RSV plus assay is intended as an aid in the diagnosis of influenza from Nasopharyngeal swab specimens and should not be used  as a sole basis for treatment. Nasal washings and aspirates are unacceptable for Xpert Xpress SARS-CoV-2/FLU/RSV testing.  Fact Sheet for Patients: BloggerCourse.com  Fact Sheet for Healthcare Providers: SeriousBroker.it  This test is not yet approved or cleared by the [place] FDA and has been authorized for detection and/or diagnosis of SARS-CoV-2 by FDA under an Emergency Use Authorization (EUA). This EUA will remain in effect (meaning this test can be used) for the duration of the COVID-19 declaration under Section 564(b)(1) of the Act, 21 U.S.C. section 360bbb-3(b)(1), unless the authorization is terminated or revoked.     Resp Syncytial Virus by PCR NEGATIVE NEGATIVE    Comment: (NOTE) Fact Sheet for Patients: BloggerCourse.com  Fact Sheet for Healthcare Providers: SeriousBroker.it  This test is not yet approved or cleared by the [place] FDA and has been authorized for detection and/or diagnosis of SARS-CoV-2 by FDA under an Emergency Use Authorization (EUA). This EUA will remain in effect (meaning this test can be used) for the duration of the COVID-19 declaration under Section 564(b)(1) of the Act, 21 U.S.C. section 360bbb-3(b)(1), unless the authorization is terminated or revoked.  Performed at [facility], [address], [place], [place] [address]   Ethanol     Status: None   Collection Time: 12/28/22 10:23 PM  Result Value Ref Range   Alcohol, Ethyl (B) <10 <10 mg/dL    Comment: (NOTE) Lowest detectable limit for serum alcohol is 10 mg/dL.  For medical purposes only. Performed at [facility], [address] [place], [place] [address]   Protime-INR     Status: Abnormal   Collection Time: 12/28/22 10:23 PM  Result Value Ref Range   Prothrombin Time 17.3 (H) 11.4 - 15.2 seconds   INR 1.4 (H) 0.8 - 1.2    Comment: (NOTE) INR goal  varies based on device and disease states. Performed at [facility], [address], [place], [place] [address]   Sample to Blood Bank     Status: None   Collection Time: 12/28/22 10:23 PM  Result Value Ref Range   Blood Bank Specimen SAMPLE AVAILABLE FOR TESTING    Sample Expiration      12/31/2022,2359 Performed at [facility], [address] [place], [place] [address]   Troponin I (High Sensitivity)     Status: None   Collection Time: 12/28/22 10:23 PM  Result Value Ref Range   Troponin I (High Sensitivity) 2 <18 ng/L    Comment: (NOTE) Elevated high sensitivity troponin I (hsTnI) values and significant  changes across serial measurements may suggest ACS but many other  chronic and acute conditions are known to elevate hsTnI results.  Refer to the "Links" section for chest pain algorithms and additional  guidance. Performed at [facility]  Lab, [address] [place], [place] [address]   CBC     Status: None   Collection Time: 12/28/22 10:23 PM  Result Value Ref Range   WBC 5.6 4.0 - 10.5 K/uL   RBC 4.04 3.87 - 5.11 MIL/uL   Hemoglobin 12.6 12.0 - 15.0 g/dL   HCT 32.4 40.1 - 02.7 %   MCV 95.5 80.0 - 100.0 fL   MCH 31.2 26.0 - 34.0 pg   MCHC 32.6 30.0 - 36.0 g/dL   RDW 25.3 66.4 - 40.3 %   Platelets 263 150 - 400 K/uL   nRBC 0.0 0.0 - 0.2 %    Comment: Performed at [facility], [address] [place], [place] [address]  I-Stat Chem 8, ED     Status: Abnormal   Collection Time: 12/28/22 10:24 PM  Result Value Ref Range   Sodium 141 135 - 145 mmol/L   Potassium 4.9 3.5 - 5.1 mmol/L   Chloride 108 98 - 111 mmol/L   BUN 21 (H) 6 - 20 mg/dL   Creatinine, Ser 9.56 (H) 0.44 - 1.00 mg/dL   Glucose, Bld 387 (H) 70 - 99 mg/dL    Comment: Glucose reference range applies only to samples taken after fasting for at least 8 hours.   Calcium, Ion 1.11 (L) 1.15 - 1.40 mmol/L   TCO2 25 22 - 32 mmol/L   Hemoglobin 12.9 12.0 - 15.0 g/dL   HCT 56.4 33.2 -  95.1 %  Comprehensive metabolic panel     Status: Abnormal   Collection Time: 12/28/22 11:42 PM  Result Value Ref Range   Sodium 139 135 - 145 mmol/L   Potassium 3.2 (L) 3.5 - 5.1 mmol/L   Chloride 108 98 - 111 mmol/L   CO2 20 (L) 22 - 32 mmol/L   Glucose, Bld 157 (H) 70 - 99 mg/dL    Comment: Glucose reference range applies only to samples taken after fasting for at least 8 hours.   BUN 14 6 - 20 mg/dL   Creatinine, Ser 8.84 (H) 0.44 - 1.00 mg/dL   Calcium 8.4 (L) 8.9 - 10.3 mg/dL   Total Protein 5.5 (L) 6.5 - 8.1 g/dL   Albumin 3.4 (L) 3.5 - 5.0 g/dL   AST 19 15 - 41 U/L   ALT 14 0 - 44 U/L   Alkaline Phosphatase 76 38 - 126 U/L   Total Bilirubin 0.4 0.3 - 1.2 mg/dL   GFR, Estimated 38 (L) >60 mL/min    Comment: (NOTE) Calculated using the CKD-EPI Creatinine Equation (2021)    Anion gap 11 5 - 15    Comment: Performed at [facility], [address] [place], [place] [address]  Urinalysis, Routine w reflex microscopic -Urine, Clean Catch     Status: Abnormal   Collection Time: 12/29/22  2:07 AM  Result Value Ref Range   Color, Urine YELLOW YELLOW   APPearance CLEAR CLEAR   Specific Gravity, Urine >1.046 (H) 1.005 - 1.030   pH 5.0 5.0 - 8.0   Glucose, UA NEGATIVE NEGATIVE mg/dL   Hgb urine dipstick NEGATIVE NEGATIVE   Bilirubin Urine NEGATIVE NEGATIVE   Ketones, ur NEGATIVE NEGATIVE mg/dL   Protein, ur NEGATIVE NEGATIVE mg/dL   Nitrite NEGATIVE NEGATIVE   Leukocytes,Ua TRACE (A) NEGATIVE   RBC / HPF 0-5 0 - 5 RBC/hpf   WBC, UA 0-5 0 - 5 WBC/hpf   Bacteria, UA NONE SEEN NONE SEEN   Squamous Epithelial /  HPF 0-5 0 - 5 /HPF   Mucus PRESENT     Comment: Performed at [facility], [address], [place], [place] [address]   CT HEAD WO CONTRAST  Result Date: 12/29/2022 CLINICAL DATA:  Trauma. EXAM: CT HEAD WITHOUT CONTRAST CT CERVICAL SPINE WITHOUT CONTRAST TECHNIQUE: Multidetector CT imaging of the head and cervical spine was performed following the standard  protocol without intravenous contrast. Multiplanar CT image reconstructions of the cervical spine were also generated. RADIATION DOSE REDUCTION: This exam was performed according to the departmental dose-optimization program which includes automated exposure control, adjustment of the mA and/or kV according to patient size and/or use of iterative reconstruction technique. COMPARISON:  Head CT dated 12/24/2021. FINDINGS: CT HEAD FINDINGS Brain: The ventricles and sulci are appropriate size for the patient's age. The gray-white matter discrimination is preserved. There is no acute intracranial hemorrhage. No mass effect or midline shift. No extra-axial fluid collection. Vascular: No hyperdense vessel or unexpected calcification. Skull: Normal. Negative for fracture or focal lesion. Sinuses/Orbits: No acute finding. Other: None CT CERVICAL SPINE FINDINGS Alignment: No acute subluxation. Skull base and vertebrae: No acute fracture. Soft tissues and spinal canal: No prevertebral fluid or swelling. No visible canal hematoma. Disc levels:  No acute findings.  Mild degenerative changes. Upper chest: Negative. Other: None IMPRESSION: 1. No acute intracranial pathology. 2. No acute/traumatic cervical spine pathology. These results were called by telephone at the time of interpretation on 12/28/2022 at 10:45 pm to Dr. Sheliah Hatch, who verbally acknowledged these results. Electronically Signed   By: Elgie Collard M.D.   On: 12/29/2022 00:14   CT CERVICAL SPINE WO CONTRAST  Result Date: 12/29/2022 CLINICAL DATA:  Trauma. EXAM: CT HEAD WITHOUT CONTRAST CT CERVICAL SPINE WITHOUT CONTRAST TECHNIQUE: Multidetector CT imaging of the head and cervical spine was performed following the standard protocol without intravenous contrast. Multiplanar CT image reconstructions of the cervical spine were also generated. RADIATION DOSE REDUCTION: This exam was performed according to the departmental dose-optimization program which includes  automated exposure control, adjustment of the mA and/or kV according to patient size and/or use of iterative reconstruction technique. COMPARISON:  Head CT dated 12/24/2021. FINDINGS: CT HEAD FINDINGS Brain: The ventricles and sulci are appropriate size for the patient's age. The gray-white matter discrimination is preserved. There is no acute intracranial hemorrhage. No mass effect or midline shift. No extra-axial fluid collection. Vascular: No hyperdense vessel or unexpected calcification. Skull: Normal. Negative for fracture or focal lesion. Sinuses/Orbits: No acute finding. Other: None CT CERVICAL SPINE FINDINGS Alignment: No acute subluxation. Skull base and vertebrae: No acute fracture. Soft tissues and spinal canal: No prevertebral fluid or swelling. No visible canal hematoma. Disc levels:  No acute findings.  Mild degenerative changes. Upper chest: Negative. Other: None IMPRESSION: 1. No acute intracranial pathology. 2. No acute/traumatic cervical spine pathology. These results were called by telephone at the time of interpretation on 12/28/2022 at 10:45 pm to Dr. Sheliah Hatch, who verbally acknowledged these results. Electronically Signed   By: Elgie Collard M.D.   On: 12/29/2022 00:14   CT CHEST ABDOMEN PELVIS W CONTRAST  Result Date: 12/28/2022 CLINICAL DATA:  Trauma. EXAM: CT CHEST, ABDOMEN, AND PELVIS WITH CONTRAST TECHNIQUE: Multidetector CT imaging of the chest, abdomen and pelvis was performed following the standard protocol during bolus administration of intravenous contrast. RADIATION DOSE REDUCTION: This exam was performed according to the departmental dose-optimization program which includes automated exposure control, adjustment of the mA and/or kV according to patient size and/or  use of iterative reconstruction technique. CONTRAST:  75mL OMNIPAQUE IOHEXOL 350 MG/ML SOLN COMPARISON:  Chest CT dated 12/09/2022. FINDINGS: CT CHEST FINDINGS Cardiovascular: There is no cardiomegaly. Probable  trace pericardial effusion. The thoracic aorta is unremarkable. The origins of the great vessels of the aortic arch and the central pulmonary arteries appear patent as visualized. Mediastinum/Nodes: No hilar or mediastinal adenopathy. Similar appearance of fluid density structure inferior to the right pulmonary vein, likely a duplicated cyst or pericardial recess. The esophagus and thyroid gland are grossly unremarkable. No mediastinal fluid collection. Lungs/Pleura: Bibasilar subpleural atelectasis/scarring. Minimal biapical subpleural blebs. No focal consolidation, pleural effusion, or pneumothorax. The central airways are patent. Musculoskeletal: No acute osseous pathology. CT ABDOMEN PELVIS FINDINGS No intra-abdominal free air or free fluid. Hepatobiliary: No focal liver abnormality is seen. No gallstones, gallbladder wall thickening, or biliary dilatation. Pancreas: Unremarkable. No pancreatic ductal dilatation or surrounding inflammatory changes. Spleen: Normal in size without focal abnormality. Adrenals/Urinary Tract: The adrenal glands are unremarkable. There is a 2 mm nonobstructing right renal upper pole calculus. No hydronephrosis. The left kidney is unremarkable. There is symmetric enhancement and excretion of contrast by both kidneys. The visualized ureters appear unremarkable. The urinary bladder is collapsed. Stomach/Bowel: There is no bowel obstruction or active inflammation. The appendix is normal. Vascular/Lymphatic: Mild aortoiliac atherosclerotic disease. The IVC is unremarkable. No portal venous gas. There is no adenopathy. Reproductive: Hysterectomy.  No adnexal masses. Other: Small fat containing umbilical hernia. Musculoskeletal: L4-L5 disc spacer and posterior fusion. L5-S1 anterior fusion. No acute osseous pathology. IMPRESSION: 1. No acute/traumatic intrathoracic, abdominal, or pelvic pathology. 2. A 2 mm nonobstructing right renal upper pole calculus. No hydronephrosis. 3.  Aortic  Atherosclerosis (ICD10-I70.0). Electronically Signed   By: Elgie Collard M.D.   On: 12/28/2022 23:16   DG Pelvis Portable  Result Date: 12/28/2022 CLINICAL DATA:  Recent fall with pelvic pain, initial encounter EXAM: PORTABLE PELVIS 1 VIEWS COMPARISON:  None Available. FINDINGS: Pelvic ring is intact. Postsurgical changes in the lower lumbar spine are noted. No acute fracture is seen. No soft tissue changes are noted. IMPRESSION: No acute abnormality noted. Electronically Signed   By: Alcide Clever M.D.   On: 12/28/2022 22:35   DG Chest Port 1 View  Result Date: 12/28/2022 CLINICAL DATA:  Recent fall with chest pain, initial encounter EXAM: PORTABLE CHEST 1 VIEW COMPARISON:  12/09/2022 FINDINGS: Cardiac shadow is stable. Inspiratory effort is poor. No infiltrate or pneumothorax is seen. Minimal left basilar atelectasis is noted. No bony abnormality is seen. IMPRESSION: Minimal left basilar atelectasis. No other focal abnormality is noted. Electronically Signed   By: Alcide Clever M.D.   On: 12/28/2022 22:34    Pending Labs Unresulted Labs (From admission, onward)     Start     Ordered   12/29/22 0500  Comprehensive metabolic panel  Tomorrow morning,   R        12/29/22 0234            Vitals/Pain Today's Vitals   12/29/22 0218 12/29/22 0300 12/29/22 0340 12/29/22 0400  BP:  (!) 104/59 101/65 102/65  Pulse:  70 78 75  Resp:  13    Temp:   98 F (36.7 C)   TempSrc:   Oral   SpO2:  100% 94% 94%  Weight:      Height:      PainSc: 2        Isolation Precautions No active isolations  Medications Medications  sodium chloride 0.9 % bolus  1,000 mL (0 mLs Intravenous Stopped 12/28/22 2305)    And  0.9 %  sodium chloride infusion (1,000 mLs Intravenous New Bag/Given 12/28/22 2351)  lactated ringers infusion (1,000 mLs Intravenous New Bag/Given 12/29/22 0116)  lactated ringers 1,000 mL with potassium chloride 20 mEq infusion ( Intravenous New Bag/Given 12/29/22 0301)  QUEtiapine  (SEROQUEL XR) 24 hr tablet 300 mg (has no administration in time range)  lamoTRIgine (LAMICTAL) tablet 50 mg (has no administration in time range)  doxepin (SINEQUAN) capsule 25 mg (has no administration in time range)  buPROPion (WELLBUTRIN XL) 24 hr tablet 300 mg (has no administration in time range)  dolutegravir (TIVICAY) tablet 50 mg (has no administration in time range)  emtricitabine-rilpivir-tenofovir AF (ODEFSEY) 200-25-25 MG per tablet 1 tablet (has no administration in time range)  acetaminophen (TYLENOL) tablet 650 mg (has no administration in time range)    Or  acetaminophen (TYLENOL) suppository 650 mg (has no administration in time range)  senna-docusate (Senokot-S) tablet 1 tablet (has no administration in time range)  iohexol (OMNIPAQUE) 350 MG/ML injection 75 mL (75 mLs Intravenous Contrast Given 12/28/22 2246)  lactated ringers bolus 1,000 mL (0 mLs Intravenous Stopped 12/29/22 0109)  acetaminophen (TYLENOL) tablet 1,000 mg (1,000 mg Oral Given 12/29/22 0113)    Mobility walks with person assist     Focused Assessments Cardiac Assessment Handoff:  Cardiac Rhythm: Normal sinus rhythm No results found for: "CKTOTAL", "CKMB", "CKMBINDEX", "TROPONINI" No results found for: "DDIMER" Does the Patient currently have chest pain? No    R Recommendations: See Admitting Provider Note  Report given to:   Additional Notes: Pt a/o x 4, unable to ambulate without assistance

## 2022-12-30 ENCOUNTER — Observation Stay (HOSPITAL_BASED_OUTPATIENT_CLINIC_OR_DEPARTMENT_OTHER): Payer: Medicaid Other

## 2022-12-30 DIAGNOSIS — R55 Syncope and collapse: Secondary | ICD-10-CM

## 2022-12-30 LAB — COMPREHENSIVE METABOLIC PANEL
ALT: 15 U/L (ref 0–44)
AST: 16 U/L (ref 15–41)
Albumin: 3.3 g/dL — ABNORMAL LOW (ref 3.5–5.0)
Alkaline Phosphatase: 75 U/L (ref 38–126)
Anion gap: 14 (ref 5–15)
BUN: 15 mg/dL (ref 6–20)
CO2: 20 mmol/L — ABNORMAL LOW (ref 22–32)
Calcium: 9.2 mg/dL (ref 8.9–10.3)
Chloride: 106 mmol/L (ref 98–111)
Creatinine, Ser: 1.14 mg/dL — ABNORMAL HIGH (ref 0.44–1.00)
GFR, Estimated: 57 mL/min — ABNORMAL LOW (ref >60)
Glucose, Bld: 100 mg/dL — ABNORMAL HIGH (ref 70–99)
Potassium: 3.9 mmol/L (ref 3.5–5.1)
Sodium: 140 mmol/L (ref 135–145)
Total Bilirubin: 0.6 mg/dL (ref 0.3–1.2)
Total Protein: 5.7 g/dL — ABNORMAL LOW (ref 6.5–8.1)

## 2022-12-30 LAB — CBC WITH DIFFERENTIAL/PLATELET
Abs Immature Granulocytes: 0.01 10*3/uL (ref 0.00–0.07)
Basophils Absolute: 0 10*3/uL (ref 0.0–0.1)
Basophils Relative: 1 %
Eosinophils Absolute: 0.1 10*3/uL (ref 0.0–0.5)
Eosinophils Relative: 2 %
HCT: 38.4 % (ref 36.0–46.0)
Hemoglobin: 12.4 g/dL (ref 12.0–15.0)
Immature Granulocytes: 0 %
Lymphocytes Relative: 54 %
Lymphs Abs: 2.5 10*3/uL (ref 0.7–4.0)
MCH: 30.8 pg (ref 26.0–34.0)
MCHC: 32.3 g/dL (ref 30.0–36.0)
MCV: 95.5 fL (ref 80.0–100.0)
Monocytes Absolute: 0.5 10*3/uL (ref 0.1–1.0)
Monocytes Relative: 10 %
Neutro Abs: 1.6 10*3/uL — ABNORMAL LOW (ref 1.7–7.7)
Neutrophils Relative %: 33 %
Platelets: 224 10*3/uL (ref 150–400)
RBC: 4.02 MIL/uL (ref 3.87–5.11)
RDW: 13.2 % (ref 11.5–15.5)
WBC: 4.7 10*3/uL (ref 4.0–10.5)
nRBC: 0 % (ref 0.0–0.2)

## 2022-12-30 LAB — ECHOCARDIOGRAM COMPLETE
Area-P 1/2: 3.21 cm2
Height: 67 in
S' Lateral: 2.9 cm
Weight: 3185.21 oz

## 2022-12-30 LAB — PHOSPHORUS: Phosphorus: 3.3 mg/dL (ref 2.5–4.6)

## 2022-12-30 LAB — MAGNESIUM: Magnesium: 1.8 mg/dL (ref 1.7–2.4)

## 2022-12-30 NOTE — Evaluation (Signed)
Occupational Therapy Evaluation Patient Details Name: Tracy [name] MRN: [id number] DOB: 1966/03/19 Today's Date: 12/30/2022   History of Present Illness 57 year old female, arrived 6/17 via EMS after fall in which she hit her head no LoC. In ED, pt BP 81/67 which rebounded to low 100's with 2L saline bolus. Trauma imaging negative. Admitted for observation. WUJ:[id number], CVA, HIV, PE.   Clinical Impression   PTA, pt lived with her spouse and was independent. Upon eval, pt performing UB ADL mod I while seated and LB ADL with up to min guard A. Pt requiring up to min A for balance with functional mobility especially with pivotal turns. Pt with overall good carryover of compensatory strategies learned with PT yesterday, but benefiting from additional education to optimize balance and decr experience of dizziness with transitions/turns. Will continue to follow, but as pt receives more vestibular PT, do not suspect need for follow up OT after acute stay.   Recommending shower seat to optimize safety in shower as pt with decr safety and requiring external assist with transitions that would affect her safety in the shower.      Recommendations for follow up therapy are one component of a multi-disciplinary discharge planning process, led by the attending physician.  Recommendations may be updated based on patient status, additional functional criteria and insurance authorization.   Assistance Recommended at Discharge Intermittent Supervision/Assistance  Patient can return home with the following A little help with walking and/or transfers;A little help with bathing/dressing/bathroom;Assistance with cooking/housework;Assist for transportation;Help with stairs or ramp for entrance    Functional Status Assessment  Patient has had a recent decline in their functional status and demonstrates the ability to make significant improvements in function in a reasonable and predictable amount of time.   Equipment Recommendations  Tub/shower seat    Recommendations for Other Services       Precautions / Restrictions Precautions Precautions: Fall Precaution Comments: fell 2x in last week Restrictions Weight Bearing Restrictions: No      Mobility Bed Mobility Overal bed mobility: Independent                  Transfers Overall transfer level: Needs assistance Equipment used: Rolling walker (2 wheels) Transfers: Sit to/from Stand Sit to Stand: Min guard           General transfer comment: for safety only      Balance Overall balance assessment: Needs assistance Sitting-balance support: Feet supported, No upper extremity supported, Feet unsupported Sitting balance-Leahy Scale: Fair     Standing balance support: Bilateral upper extremity supported, During functional activity, Reliant on assistive device for balance Standing balance-Leahy Scale: Poor Standing balance comment: intermittent min A with turns 90+ degrees. Reviewed compensatory techniques and pt progressing; approaching fair.                           ADL either performed or assessed with clinical judgement   ADL Overall ADL's : Needs assistance/impaired Eating/Feeding: Modified independent;Bed level Eating/Feeding Details (indicate cue type and reason): finishing breakfast on arrival. Grooming: Min guard;Standing Grooming Details (indicate cue type and reason): for safety. Upper Body Bathing: Sitting;Modified independent   Lower Body Bathing: Min guard;Sit to/from stand   Upper Body Dressing : Sitting;Modified independent   Lower Body Dressing: Supervision/safety;Sitting/lateral leans Lower Body Dressing Details (indicate cue type and reason): donning socks EOB Toilet Transfer: Min guard;Rolling walker (2 wheels);Ambulation;Minimal assistance Toilet Transfer Details (indicate cue type and reason): Min guard A overall;  min A 2x during session with turns. Pt progressing when returning  to bed with visual anchoring and slowed speed of head turn as compared to quick head turn to adjust visual anchor in attempts prior Toileting- Clothing Manipulation and Hygiene: Supervision/safety;Sitting/lateral lean       Functional mobility during ADLs: Min guard;Rolling walker (2 wheels);Minimal assistance General ADL Comments: Min A with turns 2x see "toilet transfer"     Vision Ability to See in Adequate Light: 0 Adequate Patient Visual Report: No change from baseline Additional Comments: Pt denying blurry or double vision. Some dizziness with head turns OOB but not provoked with head turns at EOB.     Perception Perception Perception Tested?: No   Praxis Praxis Praxis tested?: Not tested    Pertinent Vitals/Pain Pain Assessment Pain Assessment: No/denies pain     Hand Dominance Right   Extremity/Trunk Assessment Upper Extremity Assessment Upper Extremity Assessment: Overall WFL for tasks assessed   Lower Extremity Assessment Lower Extremity Assessment: Defer to PT evaluation   Cervical / Trunk Assessment Cervical / Trunk Assessment: Normal   Communication Communication Communication: No difficulties   Cognition Arousal/Alertness: Awake/alert Behavior During Therapy: WFL for tasks assessed/performed Overall Cognitive Status: Within Functional Limits for tasks assessed                                 General Comments: Pt with improved spirits/mood state this date, reporting no more dizziness on arrival. Pt with minimal fear of falling as compared to PT note yesterday. Oriented and with good carryover of visual compensatory strategies reviewed yesterday.     General Comments       Exercises     Shoulder Instructions      Home Living Family/patient expects to be discharged to:: Private residence Living Arrangements: Spouse/significant other;Parent Available Help at Discharge: Family;Available 24 hours/day Type of Home: Other(Comment) Home  Access: Stairs to enter Entrance Stairs-Number of Steps: 1   Home Layout: Two level;Bed/bath upstairs Alternate Level Stairs-Number of Steps: 12 Alternate Level Stairs-Rails: Right Bathroom Shower/Tub: Tub/shower unit;Curtain   Bathroom Toilet: Standard Bathroom Accessibility: Yes How Accessible: Accessible via walker Home Equipment: Grab bars - tub/shower;Hand held shower head          Prior Functioning/Environment Prior Level of Function : Independent/Modified Independent;Driving             Mobility Comments: No AD ADLs Comments: Keeps her grandchildren frequently        OT Problem List: Decreased activity tolerance;Impaired balance (sitting and/or standing);Decreased safety awareness;Decreased knowledge of use of DME or AE;Decreased knowledge of precautions      OT Treatment/Interventions: Self-care/ADL training;Therapeutic exercise;DME and/or AE instruction;Balance training;Patient/family education;Therapeutic activities    OT Goals(Current goals can be found in the care plan section) Acute Rehab OT Goals Patient Stated Goal: No more dizziness. OT Goal Formulation: With patient Time For Goal Achievement: 01/13/23 Potential to Achieve Goals: Good  OT Frequency: Min 2X/week    Co-evaluation              AM-PAC OT "6 Clicks" Daily Activity     Outcome Measure Help from another person eating meals?: None Help from another person taking care of personal grooming?: A Little Help from another person toileting, which includes using toliet, bedpan, or urinal?: A Little Help from another person bathing (including washing, rinsing, drying)?: A Little Help from another person to put on and taking off regular upper body  clothing?: None Help from another person to put on and taking off regular lower body clothing?: A Little 6 Click Score: 20   End of Session Equipment Utilized During Treatment: Gait belt;Rolling walker (2 wheels) Nurse Communication: Mobility  status  Activity Tolerance: Patient tolerated treatment well Patient left: in bed;with call bell/phone within reach;with family/visitor present  OT Visit Diagnosis: Unsteadiness on feet (R26.81);Muscle weakness (generalized) (M62.81);History of falling (Z91.81);BPPV BPPV - Right/Left: Right                Time: 8657-8469 OT Time Calculation (min): 18 min Charges:  OT General Charges $OT Visit: 1 Visit OT Evaluation $OT Eval Low Complexity: 1 Low  Tyler Deis, OTR/L [facility] Acute Rehabilitation Office: [phone]   Myrla Halsted 12/30/2022, 9:37 AM

## 2022-12-30 NOTE — Plan of Care (Signed)

## 2022-12-30 NOTE — TOC Initial Note (Signed)
Transition of Care [facility]) - Initial/Assessment Note    Patient Details  Name: Tracy [name] MRN: [id number] Date of Birth: 1965-10-22  Transition of Care [facility]) CM/SW Contact:    Janae Bridgeman, RN Phone Number: 12/30/2022, 1:37 PM  Clinical Narrative:                 CM met with the patient and spouse at the bedside to discuss TOC needs.  The patient was admitted for Syncopal episode and patient states that her dizziness has resolved and she plans to go home today.  The husband is at the bedside and will provide transportation to the home.  The patient was agreeable to outpatient therapy - Referral placed in the hub for OUtpatient therapy at the [facility]. Location - to be co-signed by the MD.  I advised to the patient to not drive until she is cleared by her PCP for safety reasons and the patient is aware.  The patient has no DME at the home and states that she does not need a RW.  Patient states that she walked down to the nursing desk without assistance nor use of DME and will not need the RW.  The patient has PCP appointment with Dr. Jacqulyn Bath on January 05, 2023 and will follow up.  Expected Discharge Plan: OP Rehab Barriers to Discharge: No Barriers Identified   Patient Goals and CMS Choice Patient states their goals for this hospitalization and ongoing recovery are:: To return home CMS Medicare.gov Compare Post Acute Care list provided to:: Patient Choice offered to / list presented to : Patient [place] ownership interest in [facility].provided to:: Patient    Expected Discharge Plan and Services   Discharge Planning Services: CM Consult Post Acute Care Choice: Resumption of Svcs/PTA Provider (Referral placed for OUtpatient therapy) Living arrangements for the past 2 months: Apartment                                      Prior Living Arrangements/Services Living arrangements for the past 2 months: Apartment Lives with:: Spouse, Parents Patient  language and need for interpreter reviewed:: Yes Do you feel safe going back to the place where you live?: Yes      Need for Family Participation in Patient Care: Yes (Comment) Care giver support system in place?: Yes (comment)   Criminal Activity/Legal Involvement Pertinent to Current Situation/Hospitalization: No - Comment as needed  Activities of Daily Living Home Assistive Devices/Equipment: None ADL Screening (condition at time of admission) Patient's cognitive ability adequate to safely complete daily activities?: Yes Is the patient deaf or have difficulty hearing?: No Does the patient have difficulty seeing, even when wearing glasses/contacts?: No Does the patient have difficulty concentrating, remembering, or making decisions?: No Patient able to express need for assistance with ADLs?: Yes Does the patient have difficulty dressing or bathing?: No Independently performs ADLs?: Yes (appropriate for developmental age) Does the patient have difficulty walking or climbing stairs?: No Weakness of Legs: None Weakness of Arms/Hands: None  Permission Sought/Granted Permission sought to share information with : Case Manager, Magazine features editor, PCP, Family Supports          Permission granted to share info w Relationship: Husband is present at the bedside     Emotional Assessment Appearance:: Appears stated age Attitude/Demeanor/Rapport: Gracious Affect (typically observed): Accepting Orientation: : Oriented to Self, Oriented to Place, Oriented to  Time,  Oriented to Situation Alcohol / Substance Use: Not Applicable (Patient states she quit smoking 2 months ago) Psych Involvement: No (comment)  Admission diagnosis:  Orthostatic hypotension [I95.1] Dehydration [E86.0] Patient Active Problem List   Diagnosis Date Noted   Syncope and collapse 12/29/2022   Orthostatic hypotension 12/29/2022   Acute renal failure superimposed on stage 3a chronic kidney disease (HCC)  12/29/2022   Chronic kidney disease, stage III (moderate) (HCC) 12/23/2022   Healthcare maintenance 12/23/2022   S/P lumbar fusion 10/02/2021   Vaccine counseling 08/12/2021   Exposure to hepatitis C 08/12/2021   Degenerative disc disease at L5-S1 level 07/27/2018   Schizophrenia, acute (HCC) 06/20/2018   Conversion disorder    Cervical radiculopathy    Chronic headaches 12/29/2016   Breast pain, left 05/25/2016   Dry skin dermatitis 02/10/2016   Itching due to drug 12/26/2015   HIV disease (HCC) 10/03/2015   Schizoaffective disorder (HCC) 10/03/2015   Chronic pain syndrome 10/03/2015   Cigarette smoker 10/03/2015   PCP:  Lindaann Pascal, PA-C Pharmacy:   [facility] - [place], [place] - [address] [place] [address] Phone: [phone] Fax: [phone]     Social Determinants of Health (SDOH) Social History: SDOH Screenings   Food Insecurity: No Food Insecurity (12/29/2022)  Housing: Low Risk  (12/29/2022)  Transportation Needs: No Transportation Needs (12/29/2022)  Utilities: Not At Risk (12/29/2022)  Alcohol Screen: Low Risk  (06/20/2018)  Depression (PHQ2-9): Low Risk  (12/23/2022)  Tobacco Use: Medium Risk (12/28/2022)   SDOH Interventions:     Readmission Risk Interventions     No data to display

## 2022-12-30 NOTE — Discharge Summary (Addendum)
[facility]  Physician Discharge Summary   Patient ID: Tracy [name] MRN: [id number] DOB/AGE: Feb 24, 1966 57 y.o.  Admit date: 12/28/2022 Discharge date:   12/30/2022   PCP: Lindaann Pascal, PA-C  DISCHARGE DIAGNOSES:  Principal Problem:   Syncope and collapse Active Problems:   HIV disease (HCC)   Schizoaffective disorder (HCC)   Cigarette smoker   Orthostatic hypotension   Acute renal failure superimposed on stage 3a chronic kidney disease (HCC)   RECOMMENDATIONS FOR OUTPATIENT FOLLOW UP: Follow-up with PCP within 1 week   Home Health: PT Equipment/Devices: None  CODE STATUS: Full code  DISCHARGE CONDITION: fair  Diet recommendation: As before  INITIAL HISTORY: 57 year old obese African-American female with past medical history significant for below 2 schizophrenia, history of CVA, HIV and PE as well as other comorbidities who presents with syncope and yesterday patient's husband found her getting really hot and very dizzy. Patient went her husband and she is getting scared and she blocked her husband that she recalls and per the husband the patient fell and hit her head. She is on Xarelto. The patient's husband stated there is no loss of consciousness however patient states that she did lose consciousness given that she did immediately as she went down. Patient does not remember going down and falling. 911 was called and she was found to be hypotensive. In the ED her blood pressure on the lower side at 81/67 she is given 2 L normal saline boluses and blood pressure improved   Consultations: None  Procedures: Echocardiogram is pending   HOSPITAL COURSE:   Syncope and collapse/ Symptomatic orthostatic hypotension -Likely secondary to heat exhaustion.  Patient was hydrated with improvement in her symptoms.  Seen by physical therapy.  Home health will be ordered. MRI brain was negative. Echocardiogram is pending.    Lactic Acidosis Likely due to hypovolemia.   She was hydrated.   Tobacco Abuse/Cigarette smoker -Smoking Cessastion Counseling given   HIV disease (HCC) -Home medication Complera and Tivicay resumed   Essential hypertension  Amlodipine to be placed on hold for now.   Schizoaffective disorder (HCC)/Depression -Lamictal and Seroquel resumed   History of PE -Resume Xarelto    History of CVA -Xarelto   AKI on CKD Stage 3a Metabolic Acidosis Likely due to dehydration.  She was aggressively hydrated with improvement in renal function.  Back to baseline.   Normocytic Anemia/Anemia of Chronic Kidney Disease Stage 3a -Likely was hemoconcentrated on admission and has a dilutional drop.  No overt bleeding noted.    Obesity Estimated body mass index is 31.18 kg/m as calculated from the following:   Height as of this encounter: 5\' 7"  (1.702 m).   Weight as of this encounter: 90.3 kg.  Patient feels well.  Wants to go home.  Okay for discharge once echocardiogram has been completed and report does not show anything concerning.   PERTINENT LABS:  The results of significant diagnostics from this hospitalization (including imaging, microbiology, ancillary and laboratory) are listed below for reference.    Microbiology: Recent Results (from the past 240 hour(s))  Resp panel by RT-PCR (RSV, Flu A&B, Covid) Anterior Nasal Swab     Status: None   Collection Time: 12/28/22 10:20 PM   Specimen: Anterior Nasal Swab  Result Value Ref Range Status   SARS Coronavirus 2 by RT PCR NEGATIVE NEGATIVE Final   Influenza A by PCR NEGATIVE NEGATIVE Final   Influenza B by PCR NEGATIVE NEGATIVE Final    Comment: (NOTE) The Xpert Xpress  SARS-CoV-2/FLU/RSV plus assay is intended as an aid in the diagnosis of influenza from Nasopharyngeal swab specimens and should not be used as a sole basis for treatment. Nasal washings and aspirates are unacceptable for Xpert Xpress SARS-CoV-2/FLU/RSV testing.  Fact Sheet for  Patients: BloggerCourse.com  Fact Sheet for Healthcare Providers: SeriousBroker.it  This test is not yet approved or cleared by the [place] FDA and has been authorized for detection and/or diagnosis of SARS-CoV-2 by FDA under an Emergency Use Authorization (EUA). This EUA will remain in effect (meaning this test can be used) for the duration of the COVID-19 declaration under Section 564(b)(1) of the Act, 21 U.S.C. section 360bbb-3(b)(1), unless the authorization is terminated or revoked.     Resp Syncytial Virus by PCR NEGATIVE NEGATIVE Final    Comment: (NOTE) Fact Sheet for Patients: BloggerCourse.com  Fact Sheet for Healthcare Providers: SeriousBroker.it  This test is not yet approved or cleared by the [place] FDA and has been authorized for detection and/or diagnosis of SARS-CoV-2 by FDA under an Emergency Use Authorization (EUA). This EUA will remain in effect (meaning this test can be used) for the duration of the COVID-19 declaration under Section 564(b)(1) of the Act, 21 U.S.C. section 360bbb-3(b)(1), unless the authorization is terminated or revoked.  Performed at [facility], [address] [place], [place] [address]      Labs:   Basic Metabolic Panel: Recent Labs  Lab 12/28/22 2224 12/28/22 2342 12/29/22 0658 12/30/22 0250  NA 141 139 138 140  K 4.9 3.2* 3.8 3.9  CL 108 108 111 106  CO2  --  20* 21* 20*  GLUCOSE 114* 157* 110* 100*  BUN 21* 14 13 15   CREATININE 1.80* 1.60* 1.34* 1.14*  CALCIUM  --  8.4* 8.5* 9.2  MG  --   --   --  1.8  PHOS  --   --   --  3.3   Liver Function Tests: Recent Labs  Lab 12/28/22 2342 12/29/22 0658 12/30/22 0250  AST 19 17 16   ALT 14 14 15   ALKPHOS 76 79 75  BILITOT 0.4 0.4 0.6  PROT 5.5* 5.2* 5.7*  ALBUMIN 3.4* 3.1* 3.3*    CBC: Recent Labs  Lab 12/28/22 2223 12/28/22 2224  12/29/22 0816 12/30/22 0250  WBC 5.6  --  5.7 4.7  NEUTROABS  --   --  2.7 1.6*  HGB 12.6 12.9 10.4* 12.4  HCT 38.6 38.0 32.5* 38.4  MCV 95.5  --  95.6 95.5  PLT 263  --  216 224      IMAGING STUDIES MR BRAIN WO CONTRAST  Result Date: 12/30/2022 CLINICAL DATA:  Fall EXAM: MRI HEAD WITHOUT CONTRAST TECHNIQUE: Multiplanar, multiecho pulse sequences of the brain and surrounding structures were obtained without intravenous contrast. COMPARISON:  03/23/2017 FINDINGS: Brain: No acute infarct, mass effect or extra-axial collection. No acute or chronic hemorrhage. Normal white matter signal, parenchymal volume and CSF spaces. The midline structures are normal. Vascular: Major flow voids are preserved. Skull and upper cervical spine: Normal calvarium and skull base. Visualized upper cervical spine and soft tissues are normal. Sinuses/Orbits:No paranasal sinus fluid levels or advanced mucosal thickening. No mastoid or middle ear effusion. Normal orbits. IMPRESSION: Normal brain MRI. Electronically Signed   By: Deatra Robinson M.D.   On: 12/30/2022 01:41   CT HEAD WO CONTRAST  Result Date: 12/29/2022 CLINICAL DATA:  Trauma. EXAM: CT HEAD WITHOUT CONTRAST CT CERVICAL SPINE WITHOUT CONTRAST TECHNIQUE: Multidetector CT imaging of  the head and cervical spine was performed following the standard protocol without intravenous contrast. Multiplanar CT image reconstructions of the cervical spine were also generated. RADIATION DOSE REDUCTION: This exam was performed according to the departmental dose-optimization program which includes automated exposure control, adjustment of the mA and/or kV according to patient size and/or use of iterative reconstruction technique. COMPARISON:  Head CT dated 12/24/2021. FINDINGS: CT HEAD FINDINGS Brain: The ventricles and sulci are appropriate size for the patient's age. The gray-white matter discrimination is preserved. There is no acute intracranial hemorrhage. No mass effect or  midline shift. No extra-axial fluid collection. Vascular: No hyperdense vessel or unexpected calcification. Skull: Normal. Negative for fracture or focal lesion. Sinuses/Orbits: No acute finding. Other: None CT CERVICAL SPINE FINDINGS Alignment: No acute subluxation. Skull base and vertebrae: No acute fracture. Soft tissues and spinal canal: No prevertebral fluid or swelling. No visible canal hematoma. Disc levels:  No acute findings.  Mild degenerative changes. Upper chest: Negative. Other: None IMPRESSION: 1. No acute intracranial pathology. 2. No acute/traumatic cervical spine pathology. These results were called by telephone at the time of interpretation on 12/28/2022 at 10:45 pm to Dr. Sheliah Hatch, who verbally acknowledged these results. Electronically Signed   By: Elgie Collard M.D.   On: 12/29/2022 00:14   CT CERVICAL SPINE WO CONTRAST  Result Date: 12/29/2022 CLINICAL DATA:  Trauma. EXAM: CT HEAD WITHOUT CONTRAST CT CERVICAL SPINE WITHOUT CONTRAST TECHNIQUE: Multidetector CT imaging of the head and cervical spine was performed following the standard protocol without intravenous contrast. Multiplanar CT image reconstructions of the cervical spine were also generated. RADIATION DOSE REDUCTION: This exam was performed according to the departmental dose-optimization program which includes automated exposure control, adjustment of the mA and/or kV according to patient size and/or use of iterative reconstruction technique. COMPARISON:  Head CT dated 12/24/2021. FINDINGS: CT HEAD FINDINGS Brain: The ventricles and sulci are appropriate size for the patient's age. The gray-white matter discrimination is preserved. There is no acute intracranial hemorrhage. No mass effect or midline shift. No extra-axial fluid collection. Vascular: No hyperdense vessel or unexpected calcification. Skull: Normal. Negative for fracture or focal lesion. Sinuses/Orbits: No acute finding. Other: None CT CERVICAL SPINE FINDINGS  Alignment: No acute subluxation. Skull base and vertebrae: No acute fracture. Soft tissues and spinal canal: No prevertebral fluid or swelling. No visible canal hematoma. Disc levels:  No acute findings.  Mild degenerative changes. Upper chest: Negative. Other: None IMPRESSION: 1. No acute intracranial pathology. 2. No acute/traumatic cervical spine pathology. These results were called by telephone at the time of interpretation on 12/28/2022 at 10:45 pm to Dr. Sheliah Hatch, who verbally acknowledged these results. Electronically Signed   By: Elgie Collard M.D.   On: 12/29/2022 00:14   CT CHEST ABDOMEN PELVIS W CONTRAST  Result Date: 12/28/2022 CLINICAL DATA:  Trauma. EXAM: CT CHEST, ABDOMEN, AND PELVIS WITH CONTRAST TECHNIQUE: Multidetector CT imaging of the chest, abdomen and pelvis was performed following the standard protocol during bolus administration of intravenous contrast. RADIATION DOSE REDUCTION: This exam was performed according to the departmental dose-optimization program which includes automated exposure control, adjustment of the mA and/or kV according to patient size and/or use of iterative reconstruction technique. CONTRAST:  75mL OMNIPAQUE IOHEXOL 350 MG/ML SOLN COMPARISON:  Chest CT dated 12/09/2022. FINDINGS: CT CHEST FINDINGS Cardiovascular: There is no cardiomegaly. Probable trace pericardial effusion. The thoracic aorta is unremarkable. The origins of the great vessels of the aortic arch and the central pulmonary arteries appear patent as visualized. Mediastinum/Nodes:  No hilar or mediastinal adenopathy. Similar appearance of fluid density structure inferior to the right pulmonary vein, likely a duplicated cyst or pericardial recess. The esophagus and thyroid gland are grossly unremarkable. No mediastinal fluid collection. Lungs/Pleura: Bibasilar subpleural atelectasis/scarring. Minimal biapical subpleural blebs. No focal consolidation, pleural effusion, or pneumothorax. The central airways  are patent. Musculoskeletal: No acute osseous pathology. CT ABDOMEN PELVIS FINDINGS No intra-abdominal free air or free fluid. Hepatobiliary: No focal liver abnormality is seen. No gallstones, gallbladder wall thickening, or biliary dilatation. Pancreas: Unremarkable. No pancreatic ductal dilatation or surrounding inflammatory changes. Spleen: Normal in size without focal abnormality. Adrenals/Urinary Tract: The adrenal glands are unremarkable. There is a 2 mm nonobstructing right renal upper pole calculus. No hydronephrosis. The left kidney is unremarkable. There is symmetric enhancement and excretion of contrast by both kidneys. The visualized ureters appear unremarkable. The urinary bladder is collapsed. Stomach/Bowel: There is no bowel obstruction or active inflammation. The appendix is normal. Vascular/Lymphatic: Mild aortoiliac atherosclerotic disease. The IVC is unremarkable. No portal venous gas. There is no adenopathy. Reproductive: Hysterectomy.  No adnexal masses. Other: Small fat containing umbilical hernia. Musculoskeletal: L4-L5 disc spacer and posterior fusion. L5-S1 anterior fusion. No acute osseous pathology. IMPRESSION: 1. No acute/traumatic intrathoracic, abdominal, or pelvic pathology. 2. A 2 mm nonobstructing right renal upper pole calculus. No hydronephrosis. 3.  Aortic Atherosclerosis (ICD10-I70.0). Electronically Signed   By: Elgie Collard M.D.   On: 12/28/2022 23:16   DG Pelvis Portable  Result Date: 12/28/2022 CLINICAL DATA:  Recent fall with pelvic pain, initial encounter EXAM: PORTABLE PELVIS 1 VIEWS COMPARISON:  None Available. FINDINGS: Pelvic ring is intact. Postsurgical changes in the lower lumbar spine are noted. No acute fracture is seen. No soft tissue changes are noted. IMPRESSION: No acute abnormality noted. Electronically Signed   By: Alcide Clever M.D.   On: 12/28/2022 22:35   DG Chest Port 1 View  Result Date: 12/28/2022 CLINICAL DATA:  Recent fall with chest pain,  initial encounter EXAM: PORTABLE CHEST 1 VIEW COMPARISON:  12/09/2022 FINDINGS: Cardiac shadow is stable. Inspiratory effort is poor. No infiltrate or pneumothorax is seen. Minimal left basilar atelectasis is noted. No bony abnormality is seen. IMPRESSION: Minimal left basilar atelectasis. No other focal abnormality is noted. Electronically Signed   By: Alcide Clever M.D.   On: 12/28/2022 22:34     DISCHARGE EXAMINATION: Vitals:   12/30/22 0056 12/30/22 0406 12/30/22 0718 12/30/22 1208  BP: 115/83 109/68 132/82 131/69  Pulse: 75 69 70 86  Resp: 18 18 18 18   Temp: (!) 97.4 F (36.3 C) 97.6 F (36.4 C) 98 F (36.7 C) 98.2 F (36.8 C)  TempSrc:   Oral Oral  SpO2: 95% 100% 99% 98%  Weight:      Height:       General appearance: Awake alert.  In no distress Resp: Clear to auscultation bilaterally.  Normal effort Cardio: S1-S2 is normal regular.  No S3-S4.  No rubs murmurs or bruit GI: Abdomen is soft.  Nontender nondistended.  Bowel sounds are present normal.  No masses organomegaly   DISPOSITION: Home  Discharge Instructions     Ambulatory referral to Physical Therapy   Complete by: As directed    Iontophoresis - 4 mg/ml of dexamethasone: No   T.E.N.S. Unit Evaluation and Dispense as Indicated: No   Call MD for:  difficulty breathing, headache or visual disturbances   Complete by: As directed    Call MD for:  extreme fatigue   Complete by:  As directed    Call MD for:  persistant dizziness or light-headedness   Complete by: As directed    Call MD for:  persistant nausea and vomiting   Complete by: As directed    Call MD for:  severe uncontrolled pain   Complete by: As directed    Call MD for:  temperature >100.4   Complete by: As directed    Diet - low sodium heart healthy   Complete by: As directed    Discharge instructions   Complete by: As directed    Please take your medications as prescribed.  Get up slowly from a sitting or lying position to avoid sudden drop in  blood pressure.  Seek attention if your symptoms recur.  You were cared for by a hospitalist during your hospital stay. If you have any questions about your discharge medications or the care you received while you were in the hospital after you are discharged, you can call the unit and asked to speak with the hospitalist on call if the hospitalist that took care of you is not available. Once you are discharged, your primary care physician will handle any further medical issues. Please note that NO REFILLS for any discharge medications will be authorized once you are discharged, as it is imperative that you return to your primary care physician (or establish a relationship with a primary care physician if you do not have one) for your aftercare needs so that they can reassess your need for medications and monitor your lab values. If you do not have a primary care physician, you can call [phone] for a physician referral.   Increase activity slowly   Complete by: As directed          Allergies as of 12/30/2022       Reactions   Pork-derived Products Other (See Comments)   Religious practice   Cyclobenzaprine Rash, Itching   Moxifloxacin Rash   Tetracyclines & Related Itching, Rash   Tramadol Itching, Rash        Medication List     STOP taking these medications    amLODipine 5 MG tablet Commonly known as: NORVASC       TAKE these medications    acetaminophen 500 MG tablet Commonly known as: TYLENOL Take 1,000 mg by mouth daily.   buPROPion 150 MG 24 hr tablet Commonly known as: WELLBUTRIN XL Take 150 mg by mouth every morning.   Complera 200-25-300 MG tablet Generic drug: emtricitabine-rilpivir-tenofovir DF Take 1 tablet by mouth daily.   doxepin 25 MG capsule Commonly known as: SINEQUAN Take 1 capsule (25 mg total) by mouth at bedtime.   lamoTRIgine 25 MG tablet Commonly known as: LAMICTAL Take 2 tablets (50 mg total) by mouth daily.   olmesartan 20 MG  tablet Commonly known as: BENICAR Take 20 mg by mouth daily.   QUEtiapine 300 MG 24 hr tablet Commonly known as: SEROQUEL XR Take 1 tablet (300 mg total) by mouth at bedtime.   rivaroxaban 20 MG Tabs tablet Commonly known as: XARELTO Take 20 mg by mouth at bedtime.   Tivicay 50 MG tablet Generic drug: dolutegravir Take 1 tablet (50 mg total) by mouth daily.          Follow-up Information     Long, Lorin Picket, [place]. Schedule an appointment as soon as possible for a visit in 1 week(s).   Specialty: Physician Assistant Contact information: [address] [place] [address] [phone]  [facility] at [facility]. Call.   Specialty: Rehabilitation Why: Please call the OUtpatient therapy center and follow up regarding Outpatient therapy needs.  Thank you. Contact information: [address] [id number] mc [place] [address] [phone]                TOTAL DISCHARGE TIME: 35 minutes  Tracy [name]  [facility] Pager on [email or web address]  12/30/2022, 3:55 PM

## 2022-12-30 NOTE — Progress Notes (Signed)
Physical Therapy Treatment & Vestibular Assessment Patient Details Name: Tracy [name] MRN: [id number] DOB: 10/04/1965 Today's Date: 12/30/2022   History of Present Illness 57 year old female, arrived 6/17 via EMS after fall in which she hit her head no LoC. In ED, pt BP 81/67 which rebounded to low 100's with 2L saline bolus. Trauma imaging negative. Admitted for observation. WUJ:[id number], CVA, HIV, PE.    PT Comments    Pt is demonstrating good progress in regards to her dizziness and her functional mobility. She was anxious in regards to fear of falling, resulting in a slowed, cautious gait pattern, but her fluidity and stability with ambulating improved as the distance and her confidence progressed. Pt did display x1 L knee buckle initially when ambulating, but this ceased quickly with reoccurrence. Pt progressed from needing minA to only needing min guard assist for safety to ambulate in the halls without UE support. She denied any further episodes of dizziness with vestibular testing and with changing head positions and visually scanning her environment while ambulating. At this time, still recommending HHPT to ensure pt returns to her baseline, but if she stays admitted she may progress quickly and no longer need PT follow-up. Will continue to follow acutely.   Vestibular Assessment - 12/30/22 0001       Vestibular Assessment   General Observation Pt's dizziness was unable to be reproduced and no nystagmus was noted except x2 L beating nystagmus with smooth pursuits at end range tracking to the L, which does not appear to be abnormal. Pt's dizziness does not appear to be related to BPPV and her orthostatics were negative.      Symptom Behavior   Subjective history of current problem Pt felt "faint" on 12/28/22. Pt then reports she thinks she passed out. She then reports a "spinning" sensation with movement on 6/17 and 6/18 after being admitted to the hospital. She reports no further  dizziness today, 6/19.    Type of Dizziness  Spinning    Frequency of Dizziness everytime she got up and moved    Duration of Dizziness a few seconds    Symptom Nature Motion provoked    Aggravating Factors Sit to stand    Relieving Factors Head stationary;Comments;Slow movements   gaze fixation   Progression of Symptoms Better    History of similar episodes none      Oculomotor Exam   Oculomotor Alignment Normal    Ocular ROM WFL    Spontaneous Absent    Gaze-induced  Absent    Smooth Pursuits Comment   x2 L beating nystagmus with end range tracking to L but no symptoms   Saccades Intact      Oculomotor Exam-Fixation Suppressed    Ocular Alignment normal    Ocular ROM WFL    Left Head Impulse negative    Right Head Impulse negative      Auditory   Comments able to detect soft noises bil WFL      Positional Testing   Dix-Hallpike Dix-Hallpike Right;Dix-Hallpike Left    Horizontal Canal Testing Horizontal Canal Left;Horizontal Canal Right      Dix-Hallpike Right   Dix-Hallpike Right Duration negative    Dix-Hallpike Right Symptoms No nystagmus      Dix-Hallpike Left   Dix-Hallpike Left Duration negative    Dix-Hallpike Left Symptoms No nystagmus      Horizontal Canal Right   Horizontal Canal Right Duration negative    Horizontal Canal Right Symptoms Normal      Horizontal Canal  Left   Horizontal Canal Left Duration negative    Horizontal Canal Left Symptoms Normal      Orthostatics   BP supine (x 5 minutes) 118/76    HR supine (x 5 minutes) 74    BP sitting 136/93    HR sitting 86    BP standing (after 1 minute) 132/98    HR standing (after 1 minute) 93    BP standing (after 3 minutes) 129/98    HR standing (after 3 minutes) 94    Orthostatics Comment negative, no symptoms                    Recommendations for follow up therapy are one component of a multi-disciplinary discharge planning process, led by the attending physician.  Recommendations may  be updated based on patient status, additional functional criteria and insurance authorization.  Follow Up Recommendations       Assistance Recommended at Discharge Frequent or constant Supervision/Assistance  Patient can return home with the following Assistance with cooking/housework;Assist for transportation;Help with stairs or ramp for entrance;A little help with walking and/or transfers;A little help with bathing/dressing/bathroom   Equipment Recommendations  Rolling walker (2 wheels) (vs no AD pending progress)    Recommendations for Other Services       Precautions / Restrictions Precautions Precautions: Fall Precaution Comments: fell 2x in last week Restrictions Weight Bearing Restrictions: No     Mobility  Bed Mobility Overal bed mobility: Modified Independent             General bed mobility comments: HOB elevated, no assistance needed    Transfers Overall transfer level: Needs assistance Equipment used: None Transfers: Sit to/from Stand Sit to Stand: Min guard           General transfer comment: Min guard for safety, no LOB    Ambulation/Gait Ambulation/Gait assistance: Min guard, Min assist Gait Distance (Feet): 370 Feet Assistive device: None Gait Pattern/deviations: Step-through pattern, Knees buckling Gait velocity: reduced Gait velocity interpretation: 1.31 - 2.62 ft/sec, indicative of limited community ambulator   General Gait Details: Pt with x1 L knee buckle after ambulating the initial ~30 ft but no further buckling after this. MinA to maintain balance initially but progressed to min guard as her confidence improved with mobility. Pt cued not to perform gaze stabilization and to turn her head L <> R 3x and nod her head up <> down 3x to assess her functional balance while ambulating, but pt denying any further dizziness and no other LOB noted with these tasks.   Stairs             Wheelchair Mobility    Modified Rankin (Stroke  Patients Only)       Balance Overall balance assessment: Needs assistance Sitting-balance support: Feet supported, No upper extremity supported, Feet unsupported Sitting balance-Leahy Scale: Good     Standing balance support: During functional activity, No upper extremity supported Standing balance-Leahy Scale: Fair Standing balance comment: Cautious but able to ambulate without UE support at a min guard assist level                            Cognition Arousal/Alertness: Awake/alert Behavior During Therapy: Anxious, WFL for tasks assessed/performed Overall Cognitive Status: Within Functional Limits for tasks assessed  General Comments: Pt mildly anxious in regards to fear of falling again        Exercises      General Comments        Pertinent Vitals/Pain Pain Assessment Pain Assessment: No/denies pain    Home Living Family/patient expects to be discharged to:: Private residence Living Arrangements: Spouse/significant other;Parent Available Help at Discharge: Family;Available 24 hours/day Type of Home: Other(Comment) Home Access: Stairs to enter   Entrance Stairs-Number of Steps: 1 Alternate Level Stairs-Number of Steps: 12 Home Layout: Two level;Bed/bath upstairs Home Equipment: Grab bars - tub/shower;Hand held shower head      Prior Function            PT Goals (current goals can now be found in the care plan section) Acute Rehab PT Goals Patient Stated Goal: to not fall PT Goal Formulation: With patient Time For Goal Achievement: 01/12/23 Potential to Achieve Goals: Fair Progress towards PT goals: Progressing toward goals    Frequency    Min 3X/week      PT Plan Current plan remains appropriate    Co-evaluation              AM-PAC PT "6 Clicks" Mobility   Outcome Measure  Help needed turning from your back to your side while in a flat bed without using bedrails?: None Help  needed moving from lying on your back to sitting on the side of a flat bed without using bedrails?: None Help needed moving to and from a bed to a chair (including a wheelchair)?: A Little Help needed standing up from a chair using your arms (e.g., wheelchair or bedside chair)?: A Little Help needed to walk in hospital room?: A Little Help needed climbing 3-5 steps with a railing? : A Little 6 Click Score: 20    End of Session Equipment Utilized During Treatment: Gait belt Activity Tolerance: Patient tolerated treatment well Patient left: in bed;with call bell/phone within reach;with bed alarm set;with family/visitor present   PT Visit Diagnosis: Unsteadiness on feet (R26.81);Other abnormalities of gait and mobility (R26.89)     Time: 2841-3244 PT Time Calculation (min) (ACUTE ONLY): 25 min  Charges:  $Gait Training: 8-22 mins $Therapeutic Activity: 8-22 mins                     Raymond Gurney, PT, DPT Acute Rehabilitation Services  Office: [phone]    Jewel Baize 12/30/2022, 11:24 AM

## 2022-12-30 NOTE — Progress Notes (Signed)
  Echocardiogram 2D Echocardiogram has been performed.  Delcie Roch 12/30/2022, 3:21 PM

## 2023-01-01 ENCOUNTER — Other Ambulatory Visit: Payer: Self-pay | Admitting: Family

## 2023-01-01 ENCOUNTER — Ambulatory Visit
Admission: RE | Admit: 2023-01-01 | Discharge: 2023-01-01 | Disposition: A | Discharge status: Home / Self Care | Payer: Medicaid Other | Source: Ambulatory Visit | Attending: Family | Admitting: Family

## 2023-01-01 ENCOUNTER — Ambulatory Visit: Payer: Medicaid Other

## 2023-01-01 DIAGNOSIS — N644 Mastodynia: Secondary | ICD-10-CM

## 2023-01-01 DIAGNOSIS — Z9189 Other specified personal risk factors, not elsewhere classified: Secondary | ICD-10-CM

## 2023-02-02 ENCOUNTER — Other Ambulatory Visit: Payer: Self-pay

## 2023-02-02 ENCOUNTER — Ambulatory Visit (INDEPENDENT_AMBULATORY_CARE_PROVIDER_SITE_OTHER): Payer: MEDICAID | Admitting: Infectious Diseases

## 2023-02-02 ENCOUNTER — Encounter: Payer: Self-pay | Admitting: Infectious Diseases

## 2023-02-02 ENCOUNTER — Other Ambulatory Visit (HOSPITAL_COMMUNITY)
Admission: RE | Admit: 2023-02-02 | Discharge: 2023-02-02 | Disposition: A | Discharge status: Home / Self Care | Payer: MEDICAID | Source: Ambulatory Visit | Attending: Infectious Diseases | Admitting: Infectious Diseases

## 2023-02-02 VITALS — BP 136/85 | HR 90 | Temp 98.0°F | Ht 68.0 in | Wt 193.0 lb

## 2023-02-02 DIAGNOSIS — Z113 Encounter for screening for infections with a predominantly sexual mode of transmission: Secondary | ICD-10-CM

## 2023-02-02 DIAGNOSIS — N898 Other specified noninflammatory disorders of vagina: Secondary | ICD-10-CM | POA: Insufficient documentation

## 2023-02-02 DIAGNOSIS — Z124 Encounter for screening for malignant neoplasm of cervix: Secondary | ICD-10-CM | POA: Diagnosis present

## 2023-02-02 MED ORDER — METRONIDAZOLE 500 MG PO TABS
500.0000 mg | ORAL_TABLET | Freq: Two times a day (BID) | ORAL | 0 refills | Status: AC
Start: 2023-02-02 — End: 2023-02-09

## 2023-02-02 NOTE — Assessment & Plan Note (Signed)
Cervical swab collected today - given mild cervicitis observed, will treat empirically with metronidazole 500 mg BID x 7d to cover BV/t vaginalis. Does not seem like candidal infection.  Check GC/CT as well.

## 2023-02-02 NOTE — Patient Instructions (Signed)
   Call the GI Office to set up your colonoscopy  Address: [address], [place], [place] [address] Phone: [phone]  Start the Metronidazole 1 tablet twice a day for 1 week to treat the vaginal drainage.

## 2023-02-02 NOTE — Assessment & Plan Note (Signed)
Cervical erythema noted - see vaginal discharge for problem based charting.  Cervical brushing collected for cytology and HPV.  Discussed recommended screening interval for women living with HIV disease meant to be lifelong and at an interval of Q1-3 years pending results. Acceptable to space out Q3y with 3 consecutively normal exams. Further recommendations for Tracy [name] to follow today's results.  Results will be communicated to the patient via mychart

## 2023-02-02 NOTE — Progress Notes (Signed)
Subjective :    Tracy [name] is a 57 y.o. female here for an annual pelvic exam and pap smear.   Review of Systems: Current GYN complaints or concerns: menopause since 2015 with no periods since.  Vaginal discharge that has been present off and on for months. No odor, more thin white but texture varies. She has had antibiotic use recently.  No new sexual partners - married to husband nearly 19 years.   Patient denies any abdominal/pelvic pain, problems with bowel movements, urination, vaginal discharge or intercourse.   Past Medical History:  Diagnosis Date   Arthritis    Chronic back pain    Chronic headaches    Depression    HIV infection (HCC)    Joint pain    Localized swelling of both lower legs    Legs, feet and hands   Pre-diabetes    Pulmonary embolism (HCC)    on Xarelto   Schizoaffective disorder (HCC)    Seasonal allergies    Stroke (cerebrum) (HCC)    patient denies, states it was a heat stroke   Weight loss     Gynecologic History: No obstetric history on file.  No LMP recorded. Patient has had a hysterectomy. Contraception: post menopausal status Last Pap: can't recall Results were:  Last Mammogram: 2024 - normal results reported     Objective :   Physical Exam - chaperone present  Constitutional: Well developed, well nourished, no acute distress. She is alert and oriented x3.  Pelvic: External genitalia is normal in appearance. The vagina is normal in appearance. The cervix is bulbous and easily visualized. Exam was uncomfortable for her and cervix is erythematous. No drainage noted today.  Psych: She has a normal mood and affect.      Assessment & Plan:    Patient Active Problem List   Diagnosis Date Noted   Vaginal discharge 02/02/2023   Screening for cervical cancer 02/02/2023   Syncope and collapse 12/29/2022   Orthostatic hypotension 12/29/2022   Acute renal failure superimposed on stage 3a chronic kidney disease (HCC) 12/29/2022    Chronic kidney disease, stage III (moderate) (HCC) 12/23/2022   Healthcare maintenance 12/23/2022   S/P lumbar fusion 10/02/2021   Vaccine counseling 08/12/2021   Exposure to hepatitis C 08/12/2021   Degenerative disc disease at L5-S1 level 07/27/2018   Schizophrenia, acute (HCC) 06/20/2018   Conversion disorder    Cervical radiculopathy    Chronic headaches 12/29/2016   Breast pain, left 05/25/2016   Dry skin dermatitis 02/10/2016   Itching due to drug 12/26/2015   HIV disease (HCC) 10/03/2015   Schizoaffective disorder (HCC) 10/03/2015   Chronic pain syndrome 10/03/2015   Cigarette smoker 10/03/2015    Problem List Items Addressed This Visit       Unprioritized   Screening for cervical cancer - Primary    Cervical erythema noted - see vaginal discharge for problem based charting.  Cervical brushing collected for cytology and HPV.  Discussed recommended screening interval for women living with HIV disease meant to be lifelong and at an interval of Q1-3 years pending results. Acceptable to space out Q3y with 3 consecutively normal exams. Further recommendations for Tracy [name] to follow today's results.  Results will be communicated to the patient via mychart        Relevant Orders   Cytology - PAP( [facility])   Vaginal discharge    Cervical swab collected today - given mild cervicitis observed, will treat empirically  with metronidazole 500 mg BID x 7d to cover BV/t vaginalis. Does not seem like candidal infection.  Check GC/CT as well.       Relevant Medications   metroNIDAZOLE (FLAGYL) 500 MG tablet   Other Relevant Orders   Cervicovaginal ancillary only( [facility])   Other Visit Diagnoses     Screening for STDs (sexually transmitted diseases)            Rexene Alberts, MSN, NP-C [facility] Office: [phone] Pager: [phone]  02/02/23 10:59 AM

## 2023-02-03 LAB — CERVICOVAGINAL ANCILLARY ONLY
Bacterial Vaginitis (gardnerella): NEGATIVE
Candida Glabrata: POSITIVE — AB
Candida Vaginitis: NEGATIVE
Chlamydia: NEGATIVE
Comment: NEGATIVE
Comment: NEGATIVE
Comment: NEGATIVE
Comment: NEGATIVE
Comment: NEGATIVE
Comment: NORMAL
Neisseria Gonorrhea: NEGATIVE
Trichomonas: NEGATIVE

## 2023-03-05 ENCOUNTER — Ambulatory Visit (INDEPENDENT_AMBULATORY_CARE_PROVIDER_SITE_OTHER): Payer: MEDICAID | Admitting: Gastroenterology

## 2023-03-05 ENCOUNTER — Telehealth: Payer: Self-pay | Admitting: *Deleted

## 2023-03-05 ENCOUNTER — Encounter: Payer: Self-pay | Admitting: Gastroenterology

## 2023-03-05 VITALS — BP 128/86 | HR 98 | Ht 68.0 in | Wt 193.0 lb

## 2023-03-05 DIAGNOSIS — Z1211 Encounter for screening for malignant neoplasm of colon: Secondary | ICD-10-CM | POA: Diagnosis not present

## 2023-03-05 DIAGNOSIS — Z7901 Long term (current) use of anticoagulants: Secondary | ICD-10-CM | POA: Insufficient documentation

## 2023-03-05 MED ORDER — NA SULFATE-K SULFATE-MG SULF 17.5-3.13-1.6 GM/177ML PO SOLN: 1.0000 | Freq: Once | ORAL | 0 refills | Status: AC

## 2023-03-05 NOTE — Patient Instructions (Addendum)
You will be contacted by our office prior to your procedure for directions on holding your Xarelto.  If you do not hear from our office 1 week prior to your scheduled procedure, please call [phone] to discuss.    You have been scheduled for a colonoscopy. Please follow written instructions given to you at your visit today.   Please pick up your prep supplies at the pharmacy within the next 1-3 days.  If you use inhalers (even only as needed), please bring them with you on the day of your procedure.  DO NOT TAKE 7 DAYS PRIOR TO TEST- Trulicity (dulaglutide) Ozempic, Wegovy (semaglutide) Mounjaro (tirzepatide) Bydureon Bcise (exanatide extended release)  DO NOT TAKE 1 DAY PRIOR TO YOUR TEST Rybelsus (semaglutide) Adlyxin (lixisenatide) Victoza (liraglutide) Byetta (exanatide) _______________________________________________________  If your blood pressure at your visit was 140/90 or greater, please contact your primary care physician to follow up on this.  _______________________________________________________  If you are age 56 or older, your body mass index should be between 23-30. Your Body mass index is 29.35 kg/m. If this is out of the aforementioned range listed, please consider follow up with your Primary Care Provider.  If you are age 62 or younger, your body mass index should be between 19-25. Your Body mass index is 29.35 kg/m. If this is out of the aformentioned range listed, please consider follow up with your Primary Care Provider.   ________________________________________________________  The [facility] providers would like to encourage you to use [facility] to communicate with providers for non-urgent requests or questions.  Due to long hold times on the telephone, sending your provider a message by [facility] may be a faster and more efficient way to get a response.  Please allow 48 business hours for a response.  Please remember that this is for non-urgent requests.   _______________________________________________________

## 2023-03-05 NOTE — Progress Notes (Signed)
03/05/2023 Tracy [name] [id number] [birth year]   HISTORY OF PRESENT ILLNESS: This is a 57 year old female who is new to our office.  Has been referred here by Marcos Eke, FNP, with infectious disease to discuss colonoscopy.  She had a colonoscopy at [facility] in January 2017 and did not have any polyps removed.  They told her she did not need another for 10 years.  She says that her infectious disease provider told her that she needed to have it done, that she was due.  She has a family history of colon cancer in a maternal aunt, but no first-degree relative.  No GI complaints at this time.  She is on Xarelto for history of PEs.  She has held it for 24 hours in the past for back surgeries, etc, without issues.   Past Medical History:  Diagnosis Date   Arthritis    Chronic back pain    Chronic headaches    Chronic kidney disease    Depression    HIV infection (HCC)    Joint pain    Localized swelling of both lower legs    Legs, feet and hands   Pre-diabetes    Pulmonary embolism (HCC)    on Xarelto   Schizoaffective disorder (HCC)    Seasonal allergies    Stroke (cerebrum) (HCC)    patient denies, states it was a heat stroke   Weight loss    Past Surgical History:  Procedure Laterality Date   ABDOMINAL EXPOSURE N/A 07/27/2018   Procedure: ABDOMINAL EXPOSURE;  Surgeon: Nada Libman, MD;  Location: [facility] OR;  Service: Vascular;  Laterality: N/A;   ABDOMINAL HYSTERECTOMY     ANTERIOR LUMBAR FUSION N/A 07/27/2018   Procedure: ANTERIOR LUMBAR FUSION L5-S1;  Surgeon: Venita Lick, MD;  Location: [facility] OR;  Service: Orthopedics;  Laterality: N/A;  3 hrs/ Dr. Myra Gianotti to do approach HIV Positive   BREAST EXCISIONAL BIOPSY Right 1990's   BREAST SURGERY     TRANSFORAMINAL LUMBAR INTERBODY FUSION (TLIF) WITH PEDICLE SCREW FIXATION 1 LEVEL N/A 10/02/2021   Procedure: TRANSFORAMINAL LUMBAR INTERBODY FUSION LUMBAR FOUR THROUGH FIVE;  Surgeon: Venita Lick, MD;  Location: [facility] OR;  Service:  Orthopedics;  Laterality: N/A;    reports that she quit smoking about 6 years ago. Her smoking use included cigarettes. She has never used smokeless tobacco. She reports that she does not currently use drugs after having used the following drugs: "Crack" cocaine. She reports that she does not drink alcohol. family history includes Breast cancer in her maternal aunt; Colon cancer in her maternal aunt; Diabetes in her maternal uncle and mother; High blood pressure in her maternal uncle and sister; Hypertension in her mother; Tremor in her mother. Allergies  Allergen Reactions   Pork-Derived Products Other (See Comments)    Religious practice   Cyclobenzaprine Rash and Itching   Moxifloxacin Rash   Tetracyclines & Related Itching and Rash   Tramadol Itching and Rash      Outpatient Encounter Medications as of 03/05/2023  Medication Sig   acetaminophen (TYLENOL) 500 MG tablet Take 1,000 mg by mouth daily.   buPROPion (WELLBUTRIN XL) 150 MG 24 hr tablet Take 150 mg by mouth every morning.   dolutegravir (TIVICAY) 50 MG tablet Take 1 tablet (50 mg total) by mouth daily.   doxepin (SINEQUAN) 25 MG capsule Take 1 capsule (25 mg total) by mouth at bedtime.   emtricitabine-rilpivir-tenofovir DF (COMPLERA) 200-25-300 MG tablet Take 1 tablet by mouth daily.  lamoTRIgine (LAMICTAL) 25 MG tablet Take 2 tablets (50 mg total) by mouth daily.   olmesartan (BENICAR) 20 MG tablet Take 20 mg by mouth daily.   QUEtiapine (SEROQUEL XR) 300 MG 24 hr tablet Take 1 tablet (300 mg total) by mouth at bedtime.   rivaroxaban (XARELTO) 20 MG TABS tablet Take 20 mg by mouth at bedtime.   No facility-administered encounter medications on file as of 03/05/2023.    REVIEW OF SYSTEMS  : All other systems reviewed and negative except where noted in the History of Present Illness.   PHYSICAL EXAM: BP 128/86   Pulse 98   Ht 5\' 8"  (1.727 m)   Wt 193 lb (87.5 kg)   BMI 29.35 kg/m  General: Well developed female in no  acute distress Head: Normocephalic and atraumatic Eyes:  Sclerae anicteric, conjunctiva pink. Ears: Normal auditory acuity Lungs: Clear throughout to auscultation; no W/R/R. Heart: Regular rate and rhythm; no M/R/G. Abdomen: Soft, non-distended.  BS present.  Non-tender. Rectal:  Will be done at the time of colonoscopy. Musculoskeletal: Symmetrical with no gross deformities  Skin: No lesions on visible extremities Extremities: No edema  Neurological: Alert oriented x 4, grossly non-focal Psychological:  Alert and cooperative. Normal mood and affect  ASSESSMENT AND PLAN: *Colorectal cancer screening:  She had a colonoscopy at [facility] in January 2017 and did not have any polyps removed.  They told her she did not need another for 10 years.  She says that her infectious disease provider told her that she needed to have it done, that she was due.  She has a family history of colon cancer in a maternal aunt, but no first-degree relative.  Will schedule with Dr. Barron Alvine. *Chronic anticoagulation on Xarelto for history of PE: She has held Xarelto for 24 hours in the past for back surgeries, etc issues.  Will hold Xarelto for 24 hours prior to endoscopic procedures - will instruct when and how to resume after procedure. Benefits and risks of procedure explained including risks of bleeding, perforation, infection, missed lesions, reactions to medications and possible need for hospitalization and surgery for complications. Additional rare but real risk of stroke or other vascular clotting events off of Xarelto also explained and need to seek urgent help if any signs of these problems occur. Will communicate by phone or EMR with patient's prescribing provider to confirm that holding Xarelto is reasonable in this case.     CC:  Long, [place], PA-C CC:  Marcos Eke, FNP

## 2023-03-10 ENCOUNTER — Encounter: Payer: Self-pay | Admitting: Infectious Diseases

## 2023-03-10 ENCOUNTER — Ambulatory Visit: Payer: MEDICAID | Admitting: Infectious Diseases

## 2023-03-10 ENCOUNTER — Other Ambulatory Visit: Payer: Self-pay

## 2023-03-10 VITALS — BP 115/80 | HR 101 | Temp 97.9°F | Ht 68.0 in | Wt 192.0 lb

## 2023-03-10 DIAGNOSIS — N1831 Chronic kidney disease, stage 3a: Secondary | ICD-10-CM

## 2023-03-10 DIAGNOSIS — N179 Acute kidney failure, unspecified: Secondary | ICD-10-CM | POA: Diagnosis not present

## 2023-03-10 DIAGNOSIS — B20 Human immunodeficiency virus [HIV] disease: Secondary | ICD-10-CM

## 2023-03-10 DIAGNOSIS — N898 Other specified noninflammatory disorders of vagina: Secondary | ICD-10-CM | POA: Diagnosis not present

## 2023-03-10 DIAGNOSIS — Z79899 Other long term (current) drug therapy: Secondary | ICD-10-CM

## 2023-03-10 MED ORDER — JULUCA 50-25 MG PO TABS: 1.0000 | ORAL_TABLET | Freq: Every day | ORAL | 5 refills | Status: DC

## 2023-03-10 NOTE — Patient Instructions (Addendum)
If you can tolerate the boric acid vaginal suppositories every night - a month of them may help. If you have irritation we can try to drop it down to every Monday / Wednesday and Friday.   If this fails we may have to try some different antifungal pills to try to kill it. The type of yeast you have can be a tough one to treat.   Will work on getting you set up for a bone density scan to see how your bones look - We will call you to arrange this.    STOP the Complera   STOP the Tivicay   The only pill you need is called JULUCA - take this once a day with food (full meal).   This is better for your bone and kidney health going forward.   Will see you back in 4 weeks!

## 2023-03-10 NOTE — Assessment & Plan Note (Signed)
Will change from TDF regimen to more kidney friendly one

## 2023-03-10 NOTE — Assessment & Plan Note (Signed)
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  Will try a longer course of nightly doses (4 weeks) - if she gets irritated can reduce to M/W/F dosing.  If persistent symptoms will look at oral antifungal options that may provide activity.

## 2023-03-10 NOTE — Progress Notes (Signed)
Brief Narrative   Patient ID: Tracy [name], female    DOB: 1965/12/26, 57 y.o.   MRN: [id number]   Tracy [name] is a 57 y/o female diagnosed with HIV disease in 2000 with unknown risk factor. Initial CD4 count and viral load are unavailable. Did not start treatment until 2007. No history of opportunistic infection. Genotype reviewed with no significant medication resistant mutations. ART experienced with Kaletra/Trivizir, Epzicom/Viread/Prezista/Norvir, and Complera/Tivicay.    Subjective:    Chief Complaint  Patient presents with   Follow-up    B20    HPI:  Tracy [name] is a 57 y.o. female with HIV well controlled on Complera + Tivicay   Went to see her mental health team today and switched up some of her medications. She is tired of so many pills. She has stage 3 kidney disease as well which is a newer diagnosis for her   She did a week of the boric acid vaginal suppositories and it helped a little but came back. She feels like the vaginal drainage is about the same and feels uncomfortable.    Allergies  Allergen Reactions   Pork-Derived Products Other (See Comments)    Religious practice   Cyclobenzaprine Rash and Itching   Moxifloxacin Rash   Tetracyclines & Related Itching and Rash   Tramadol Itching and Rash      Outpatient Medications Prior to Visit  Medication Sig Dispense Refill   buPROPion (WELLBUTRIN XL) 150 MG 24 hr tablet Take 150 mg by mouth every morning.     lamoTRIgine (LAMICTAL) 25 MG tablet Take 2 tablets (50 mg total) by mouth daily. (Patient taking differently: Take 100 mg by mouth daily.) 30 tablet 1   olmesartan (BENICAR) 20 MG tablet Take 20 mg by mouth daily.     QUEtiapine (SEROQUEL XR) 300 MG 24 hr tablet Take 1 tablet (300 mg total) by mouth at bedtime. (Patient taking differently: Take 400 mg by mouth at bedtime.) 30 tablet 1   rivaroxaban (XARELTO) 20 MG TABS tablet Take 20 mg by mouth at bedtime.     dolutegravir (TIVICAY) 50 MG tablet  Take 1 tablet (50 mg total) by mouth daily. 90 tablet 3   emtricitabine-rilpivir-tenofovir DF (COMPLERA) 200-25-300 MG tablet Take 1 tablet by mouth daily. 90 tablet 3   acetaminophen (TYLENOL) 500 MG tablet Take 1,000 mg by mouth daily.     doxepin (SINEQUAN) 25 MG capsule Take 1 capsule (25 mg total) by mouth at bedtime. (Patient not taking: Reported on 03/10/2023) 30 capsule 1   No facility-administered medications prior to visit.     Past Medical History:  Diagnosis Date   Arthritis    Chronic back pain    Chronic headaches    Chronic kidney disease    Depression    HIV infection (HCC)    Joint pain    Localized swelling of both lower legs    Legs, feet and hands   Pre-diabetes    Pulmonary embolism (HCC)    on Xarelto   Schizoaffective disorder (HCC)    Seasonal allergies    Stroke (cerebrum) (HCC)    patient denies, states it was a heat stroke   Weight loss      Past Surgical History:  Procedure Laterality Date   ABDOMINAL EXPOSURE N/A 07/27/2018   Procedure: ABDOMINAL EXPOSURE;  Surgeon: Nada Libman, MD;  Location: [facility] OR;  Service: Vascular;  Laterality: N/A;   ABDOMINAL HYSTERECTOMY     ANTERIOR LUMBAR FUSION N/A  07/27/2018   Procedure: ANTERIOR LUMBAR FUSION L5-S1;  Surgeon: Venita Lick, MD;  Location: [facility] OR;  Service: Orthopedics;  Laterality: N/A;  3 hrs/ Dr. Myra Gianotti to do approach HIV Positive   BREAST EXCISIONAL BIOPSY Right 1990's   BREAST SURGERY     TRANSFORAMINAL LUMBAR INTERBODY FUSION (TLIF) WITH PEDICLE SCREW FIXATION 1 LEVEL N/A 10/02/2021   Procedure: TRANSFORAMINAL LUMBAR INTERBODY FUSION LUMBAR FOUR THROUGH FIVE;  Surgeon: Venita Lick, MD;  Location: [facility] OR;  Service: Orthopedics;  Laterality: N/A;      Review of Systems  Constitutional:  Negative for appetite change, chills, diaphoresis, fatigue, fever and unexpected weight change.  Eyes:        Negative for acute change in vision  Respiratory:  Negative for chest tightness, shortness  of breath and wheezing.   Cardiovascular:  Negative for chest pain.  Gastrointestinal:  Negative for diarrhea, nausea and vomiting.  Genitourinary:  Negative for dysuria, pelvic pain and vaginal discharge.  Musculoskeletal:  Negative for neck pain and neck stiffness.  Skin:  Negative for rash.  Neurological:  Negative for seizures, syncope, weakness and headaches.  Hematological:  Negative for adenopathy. Does not bruise/bleed easily.  Psychiatric/Behavioral:  Negative for hallucinations.       Objective:    BP 115/80   Pulse (!) 101   Temp 97.9 F (36.6 C) (Temporal)   Ht 5\' 8"  (1.727 m)   Wt 192 lb (87.1 kg)   BMI 29.19 kg/m  Nursing note and vital signs reviewed.  Physical Exam Constitutional:      General: She is not in acute distress.    Appearance: She is well-developed.  Eyes:     Conjunctiva/sclera: Conjunctivae normal.  Cardiovascular:     Rate and Rhythm: Normal rate and regular rhythm.     Heart sounds: Normal heart sounds. No murmur heard.    No friction rub. No gallop.  Pulmonary:     Effort: Pulmonary effort is normal. No respiratory distress.     Breath sounds: Normal breath sounds. No wheezing or rales.  Chest:     Chest wall: No tenderness.  Abdominal:     General: Bowel sounds are normal.     Palpations: Abdomen is soft.     Tenderness: There is no abdominal tenderness.  Musculoskeletal:     Cervical back: Neck supple.  Lymphadenopathy:     Cervical: No cervical adenopathy.  Skin:    General: Skin is warm and dry.     Findings: No rash.  Neurological:     Mental Status: She is alert and oriented to person, place, and time.  Psychiatric:        Behavior: Behavior normal.        Thought Content: Thought content normal.        Judgment: Judgment normal.         03/10/2023    3:39 PM 02/02/2023    9:29 AM 12/23/2022    8:37 AM 02/25/2022    3:57 PM 08/31/2017   10:31 AM  Depression screen PHQ 2/9  Decreased Interest 0 0 0 0 0  Down,  Depressed, Hopeless 1 0 0 1 0  PHQ - 2 Score 1 0 0 1 0       Assessment & Plan:    Patient Active Problem List   Diagnosis Date Noted   Screening for colon cancer 03/05/2023   Chronic anticoagulation 03/05/2023   Vaginal discharge 02/02/2023   Screening for cervical cancer 02/02/2023   Syncope and  collapse 12/29/2022   Orthostatic hypotension 12/29/2022   Acute renal failure superimposed on stage 3a chronic kidney disease (HCC) 12/29/2022   Chronic kidney disease, stage III (moderate) (HCC) 12/23/2022   Healthcare maintenance 12/23/2022   S/P lumbar fusion 10/02/2021   Vaccine counseling 08/12/2021   Exposure to hepatitis C 08/12/2021   Degenerative disc disease at L5-S1 level 07/27/2018   Schizophrenia, acute (HCC) 06/20/2018   Conversion disorder    Cervical radiculopathy    Chronic headaches 12/29/2016   Breast pain, left 05/25/2016   Dry skin dermatitis 02/10/2016   Itching due to drug 12/26/2015   HIV disease (HCC) 10/03/2015   Schizoaffective disorder (HCC) 10/03/2015   Chronic pain syndrome 10/03/2015   Cigarette smoker 10/03/2015     Problem List Items Addressed This Visit       Unprioritized   Acute renal failure superimposed on stage 3a chronic kidney disease (HCC)    Will change from TDF regimen to more kidney friendly one       HIV disease (HCC)    Once daily Juluca should work nicely for her after reviewing her history and genotypes. We discussed switching - she will stop the complera and tivicay and switch to juluca once she receives the new medication.  Discussed precuations to continue to take with full meal to allow for absorption, avoid taking with multivitamins, avoid with acid reducing/heartburn medications.   DEXA scan to be set up with history of TDF exposure long term  Flu shot at next OV in 60m - will check VL after med switch for her then.       Relevant Medications   dolutegravir-rilpivirine (JULUCA) 50-25 MG tablet   Vaginal discharge     She had mild improvement in symptoms with boric acid vaginal suppositories for candida glabratta infection.  Will try a longer course of nightly doses (4 weeks) - if she gets irritated can reduce to M/W/F dosing.  If persistent symptoms will look at oral antifungal options that may provide activity.       Other Visit Diagnoses     High risk medication use    -  Primary   Relevant Orders   DG DXA FRACTURE ASSESSMENT       Rexene Alberts, MSN, NP-C [facility]  [email or web address] Pager: [phone] Office: [phone] [facility] Main Line: [phone] *Secure Chat Communication Welcome

## 2023-03-10 NOTE — Assessment & Plan Note (Signed)
Once daily Juluca should work nicely for her after reviewing her history and genotypes. We discussed switching - she will stop the complera and tivicay and switch to juluca once she receives the new medication.  Discussed precuations to continue to take with full meal to allow for absorption, avoid taking with multivitamins, avoid with acid reducing/heartburn medications.   DEXA scan to be set up with history of TDF exposure long term  Flu shot at next OV in 50m - will check VL after med switch for her then.

## 2023-03-12 NOTE — Telephone Encounter (Signed)
Clearance requested to patient's PCP

## 2023-03-15 ENCOUNTER — Encounter: Payer: Self-pay | Admitting: Certified Registered Nurse Anesthetist

## 2023-03-17 NOTE — Telephone Encounter (Signed)
Spoke with Aram Beecham and they asked for the clearance to be faxed again. Clearance refaxed. Received confirmation.

## 2023-03-19 ENCOUNTER — Telehealth: Payer: Self-pay | Admitting: *Deleted

## 2023-03-19 NOTE — Telephone Encounter (Signed)
No response from PCP on Xarelto clearance. Rescheduled patient to 9/23 @ 1:30 pm. Patient informed.

## 2023-03-19 NOTE — Telephone Encounter (Signed)
Called patient's PCP again for Xarelto clearance. No one answered. Rescheduled colonoscopy for 04/05/23 @ 1:30 pm.

## 2023-03-19 NOTE — Telephone Encounter (Signed)
Spoke with Aram Beecham at [facility]. She will follow up on clearance request and get back with me today.

## 2023-03-22 ENCOUNTER — Encounter: Payer: MEDICAID | Admitting: Gastroenterology

## 2023-03-25 NOTE — Telephone Encounter (Signed)
Inbound call from patient requesting a call regarding an update on Xarelto clearance. Please advise, thank you.

## 2023-03-25 NOTE — Telephone Encounter (Signed)
Spoke with Tracy [name] and no response on clearance. Provider is out of the office today. She will follow up with provider tomorrow and call back with an update.

## 2023-03-26 NOTE — Telephone Encounter (Signed)
Spoke with Tracy [name] at [facility] and no provider addressed the request still. Asked that she please make a note that the patient has already been rescheduled once could they please address it.

## 2023-03-30 NOTE — Telephone Encounter (Signed)
Left message for patient's primary care to call the office.

## 2023-03-31 NOTE — Progress Notes (Signed)
Agree with the assessment and plan as outlined by Doug Sou, PA-C. ? ?Verna Hamon, DO, FACG ? ?

## 2023-03-31 NOTE — Telephone Encounter (Signed)
Dr. Barron Alvine,   I have tried to get clearance from this patient's PCP and I'm unable to get an answer. I have had to reschedule patient once. Please advise on patient holding Xarelto.

## 2023-03-31 NOTE — Telephone Encounter (Signed)
Left message at patient's primary care to call office.

## 2023-04-01 NOTE — Telephone Encounter (Signed)
Left message for patient to call the office

## 2023-04-01 NOTE — Telephone Encounter (Signed)
Patient informed to hold Xarelto.

## 2023-04-05 ENCOUNTER — Other Ambulatory Visit: Payer: Self-pay | Admitting: Gastroenterology

## 2023-04-05 ENCOUNTER — Ambulatory Visit: Payer: MEDICAID | Admitting: Gastroenterology

## 2023-04-05 ENCOUNTER — Encounter: Payer: Self-pay | Admitting: Gastroenterology

## 2023-04-05 VITALS — BP 123/75 | HR 62 | Temp 98.8°F | Resp 16 | Ht 68.0 in | Wt 193.0 lb

## 2023-04-05 DIAGNOSIS — Z1211 Encounter for screening for malignant neoplasm of colon: Secondary | ICD-10-CM

## 2023-04-05 DIAGNOSIS — D122 Benign neoplasm of ascending colon: Secondary | ICD-10-CM

## 2023-04-05 MED ORDER — SODIUM CHLORIDE 0.9 % IV SOLN: 500.0000 mL | Freq: Once | INTRAVENOUS | Status: DC

## 2023-04-05 NOTE — Progress Notes (Signed)
To pacu, vss. Report to RN.tb

## 2023-04-05 NOTE — Progress Notes (Signed)
Called to room to assist during endoscopic procedure.  Patient ID and intended procedure confirmed with present staff. Received instructions for my participation in the procedure from the performing physician.  

## 2023-04-05 NOTE — Progress Notes (Signed)
GASTROENTEROLOGY PROCEDURE H&P NOTE   Primary Care Physician: Debroah Baller, FNP    Reason for Procedure:  Colon Cancer screening  Plan:    Colonoscopy  Patient is appropriate for endoscopic procedure(s) in the ambulatory ([facility]) setting.  The nature of the procedure, as well as the risks, benefits, and alternatives were carefully and thoroughly reviewed with the patient. Ample time for discussion and questions allowed. The patient understood, was satisfied, and agreed to proceed.     HPI: Tracy [name] is a 57 y.o. female who presents for colonoscopy for routine Colon Cancer screening.  No active GI symptoms.  Maternal aunt with colon cancer.  Patient is otherwise without complaints or active issues today.  Holding Xarelto for procedure today.   Past Medical History:  Diagnosis Date   Arthritis    Chronic back pain    Chronic headaches    Chronic kidney disease    Depression    HIV infection (HCC)    Joint pain    Localized swelling of both lower legs    Legs, feet and hands   Pre-diabetes    Pulmonary embolism (HCC)    on Xarelto   Schizoaffective disorder (HCC)    Seasonal allergies    Stroke (cerebrum) (HCC)    patient denies, states it was a heat stroke   Weight loss     Past Surgical History:  Procedure Laterality Date   ABDOMINAL EXPOSURE N/A 07/27/2018   Procedure: ABDOMINAL EXPOSURE;  Surgeon: Nada Libman, MD;  Location: [facility] OR;  Service: Vascular;  Laterality: N/A;   ABDOMINAL HYSTERECTOMY     ANTERIOR LUMBAR FUSION N/A 07/27/2018   Procedure: ANTERIOR LUMBAR FUSION L5-S1;  Surgeon: Venita Lick, MD;  Location: [facility] OR;  Service: Orthopedics;  Laterality: N/A;  3 hrs/ Dr. Myra Gianotti to do approach HIV Positive   BREAST EXCISIONAL BIOPSY Right 1990's   BREAST SURGERY     TRANSFORAMINAL LUMBAR INTERBODY FUSION (TLIF) WITH PEDICLE SCREW FIXATION 1 LEVEL N/A 10/02/2021   Procedure: TRANSFORAMINAL LUMBAR INTERBODY FUSION LUMBAR FOUR THROUGH FIVE;   Surgeon: Venita Lick, MD;  Location: [facility] OR;  Service: Orthopedics;  Laterality: N/A;    Prior to Admission medications   Medication Sig Start Date End Date Taking? Authorizing Provider  buPROPion (WELLBUTRIN XL) 150 MG 24 hr tablet Take 150 mg by mouth every morning. 07/31/21  Yes [provider]  dolutegravir-rilpivirine (JULUCA) 50-25 MG tablet Take 1 tablet by mouth daily before lunch. 03/10/23  Yes Blanchard Kelch, NP  lamoTRIgine (LAMICTAL) 25 MG tablet Take 2 tablets (50 mg total) by mouth daily. Patient taking differently: Take 100 mg by mouth daily. 12/23/22  Yes Veryl Speak, FNP  olmesartan (BENICAR) 20 MG tablet Take 20 mg by mouth daily. 12/09/22  Yes [provider]  QUEtiapine (SEROQUEL XR) 300 MG 24 hr tablet Take 1 tablet (300 mg total) by mouth at bedtime. Patient taking differently: Take 400 mg by mouth at bedtime. 12/23/22  Yes Veryl Speak, FNP  rivaroxaban (XARELTO) 20 MG TABS tablet Take 20 mg by mouth at bedtime. 07/10/20   [provider]    Current Outpatient Medications  Medication Sig Dispense Refill   buPROPion (WELLBUTRIN XL) 150 MG 24 hr tablet Take 150 mg by mouth every morning.     dolutegravir-rilpivirine (JULUCA) 50-25 MG tablet Take 1 tablet by mouth daily before lunch. 30 tablet 5   lamoTRIgine (LAMICTAL) 25 MG tablet Take 2 tablets (50 mg total) by mouth daily. (  Patient taking differently: Take 100 mg by mouth daily.) 30 tablet 1   olmesartan (BENICAR) 20 MG tablet Take 20 mg by mouth daily.     QUEtiapine (SEROQUEL XR) 300 MG 24 hr tablet Take 1 tablet (300 mg total) by mouth at bedtime. (Patient taking differently: Take 400 mg by mouth at bedtime.) 30 tablet 1   rivaroxaban (XARELTO) 20 MG TABS tablet Take 20 mg by mouth at bedtime.     Current Facility-Administered Medications  Medication Dose Route Frequency Provider Last Rate Last Admin   0.9 %  sodium chloride infusion  500 mL Intravenous Once Lavante Toso  V, DO        Allergies as of 04/05/2023 - Review Complete 04/05/2023  Allergen Reaction Noted   Tetracycline Rash 04/05/2023   Glyceryl stear-peg 100 stear Other (See Comments) 04/05/2023   Pork-derived products Other (See Comments) 09/29/2021   Cyclobenzaprine Rash and Itching 10/03/2015   Moxifloxacin Rash 10/03/2015   Tetracyclines & related Itching and Rash 10/03/2015   Tramadol Itching and Rash 10/03/2015    Family History  Problem Relation Age of Onset   Diabetes Mother    Hypertension Mother    Tremor Mother    High blood pressure Sister    Breast cancer Maternal Aunt    Colon cancer Maternal Aunt    High blood pressure Maternal Uncle    Diabetes Maternal Uncle    Rectal cancer Neg Hx    Esophageal cancer Neg Hx    Stomach cancer Neg Hx     Social History   Socioeconomic History   Marital status: Married    Spouse name: Not on file   Number of children: 1   Years of education: Not on file   Highest education level: Not on file  Occupational History   Occupation: NA  Tobacco Use   Smoking status: Former    Current packs/day: 0.00    Types: Cigarettes    Quit date: 04/26/2016    Years since quitting: 6.9   Smokeless tobacco: Never   Tobacco comments:    1 pack of cigarettes will last a month  Vaping Use   Vaping status: Former  Substance and Sexual Activity   Alcohol use: No    Alcohol/week: 0.0 standard drinks of alcohol    Comment: quit 12/2005   Drug use: Not Currently    Types: "Crack" cocaine    Comment: Previous drug use, quit 2004-05; relapsed 05/2018, clean since 06/08/2018   Sexual activity: Yes    Partners: Male    Birth control/protection: None    Comment: declined condoms   Other Topics Concern   Not on file  Social History Narrative   Lives at home w/ her husband   Caffeine: 1/2 c coffee per day   Social Determinants of Health   Financial Resource Strain: Not on file  Food Insecurity: No Food Insecurity (12/29/2022)   Hunger Vital  Sign    Worried About Running Out of Food in the Last Year: Never true    Ran Out of Food in the Last Year: Never true  Transportation Needs: No Transportation Needs (12/29/2022)   PRAPARE - Administrator, Civil Service (Medical): No    Lack of Transportation (Non-Medical): No  Physical Activity: Not on file  Stress: Not on file  Social Connections: Not on file  Intimate Partner Violence: Not At Risk (12/29/2022)   Humiliation, Afraid, Rape, and Kick questionnaire    Fear of Current or Ex-Partner:  No    Emotionally Abused: No    Physically Abused: No    Sexually Abused: No    Physical Exam: Vital signs in last 24 hours: @BP  119/79   Pulse 69   Temp 98.8 F (37.1 C)   Resp 15   Ht 5\' 8"  (1.727 m)   Wt 193 lb (87.5 kg)   SpO2 100%   BMI 29.35 kg/m  GEN: NAD EYE: Sclerae anicteric ENT: MMM CV: Non-tachycardic Pulm: CTA b/l GI: Soft, NT/ND NEURO:  Alert & Oriented x 3   Doristine Locks, DO [facility]   04/05/2023 1:42 PM

## 2023-04-05 NOTE — Op Note (Signed)
[facility] Patient Name: Tracy [name] Procedure Date: 04/05/2023 1:29 PM MRN: [id number] Endoscopist: Doristine Locks , MD, [id number] Age: 57 Referring MD:  Date of Birth: 1966/06/07 Gender: Female Account #: [id number] Procedure:                Colonoscopy Indications:              Screening for colorectal malignant neoplasm                           Family history notable for Maternal aunt with colon                            cancer. Medicines:                Monitored Anesthesia Care Procedure:                Pre-Anesthesia Assessment:                           - Prior to the procedure, a History and Physical                            was performed, and patient medications and                            allergies were reviewed. The patient's tolerance of                            previous anesthesia was also reviewed. The risks                            and benefits of the procedure and the sedation                            options and risks were discussed with the patient.                            All questions were answered, and informed consent                            was obtained. Prior Anticoagulants: The patient has                            taken Xarelto (rivaroxaban), last dose was 2 days                            prior to procedure. ASA Grade Assessment: II - A                            patient with mild systemic disease. After reviewing                            the risks and benefits, the patient was deemed in  satisfactory condition to undergo the procedure.                           After obtaining informed consent, the colonoscope                            was passed under direct vision. Throughout the                            procedure, the patient's blood pressure, pulse, and                            oxygen saturations were monitored continuously. The                            [id number] Olympus Scope was  introduced through the                            anus and advanced to the the cecum, identified by                            appendiceal orifice and ileocecal valve. The                            colonoscopy was performed without difficulty. The                            patient tolerated the procedure well. The quality                            of the bowel preparation was good. The ileocecal                            valve, appendiceal orifice, and rectum were                            photographed. Scope In: 1:46:38 PM Scope Out: 2:03:42 PM Scope Withdrawal Time: 0 hours 10 minutes 27 seconds  Total Procedure Duration: 0 hours 17 minutes 4 seconds  Findings:                 The perianal and digital rectal examinations were                            normal.                           A 4 mm polyp was found in the ascending colon. The                            polyp was sessile. The polyp was removed with a                            cold snare. Resection and retrieval were complete.  Estimated blood loss was minimal.                           The exam was otherwise normal throughout the                            examined colon.                           The retroflexed view of the distal rectum and anal                            verge was normal and showed no anal or rectal                            abnormalities.                           The ascending colon revealed moderately excessive                            looping. Advancing the scope required using manual                            pressure. Complications:            No immediate complications. Estimated Blood Loss:     Estimated blood loss was minimal. Impression:               - One 4 mm polyp in the ascending colon, removed                            with a cold snare. Resected and retrieved.                           - The distal rectum and anal verge are normal on                             retroflexion view.                           - There was significant looping of the colon. Recommendation:           - Patient has a contact number available for                            emergencies. The signs and symptoms of potential                            delayed complications were discussed with the                            patient. Return to normal activities tomorrow.                            Written discharge instructions were provided to the  patient.                           - Resume previous diet.                           - Resume Xarelto (rivaroxaban) at prior dose                            tomorrow.                           - Await pathology results.                           - Repeat colonoscopy in 5-10 years for surveillance                            based on pathology results.                           - Return to GI clinic PRN. Doristine Locks, MD 04/05/2023 2:09:46 PM

## 2023-04-05 NOTE — Patient Instructions (Addendum)
Resume Xarelto tomorrow 04/06/23 as normal. Do not take today.   YOU HAD AN ENDOSCOPIC PROCEDURE TODAY AT THE [facility]:   Refer to the procedure report that was given to you for any specific questions about what was found during the examination.  If the procedure report does not answer your questions, please call your gastroenterologist to clarify.  If you requested that your care partner not be given the details of your procedure findings, then the procedure report has been included in a sealed envelope for you to review at your convenience later.  YOU SHOULD EXPECT: Some feelings of bloating in the abdomen. Passage of more gas than usual.  Walking can help get rid of the air that was put into your GI tract during the procedure and reduce the bloating. If you had a lower endoscopy (such as a colonoscopy or flexible sigmoidoscopy) you may notice spotting of blood in your stool or on the toilet paper. If you underwent a bowel prep for your procedure, you may not have a normal bowel movement for a few days.  Please Note:  You might notice some irritation and congestion in your nose or some drainage.  This is from the oxygen used during your procedure.  There is no need for concern and it should clear up in a day or so.  SYMPTOMS TO REPORT IMMEDIATELY:  Following lower endoscopy (colonoscopy or flexible sigmoidoscopy):  Excessive amounts of blood in the stool  Significant tenderness or worsening of abdominal pains  Swelling of the abdomen that is new, acute  Fever of 100F or higher  For urgent or emergent issues, a gastroenterologist can be reached at any hour by calling [phone]. Do not use MyChart messaging for urgent concerns.    DIET:  We do recommend a small meal at first, but then you may proceed to your regular diet.  Drink plenty of fluids but you should avoid alcoholic beverages for 24 hours.  ACTIVITY:  You should plan to take it easy for the rest of today and you  should NOT DRIVE or use heavy machinery until tomorrow (because of the sedation medicines used during the test).    FOLLOW UP: Our staff will call the number listed on your records the next business day following your procedure.  We will call around 7:15- 8:00 am to check on you and address any questions or concerns that you may have regarding the information given to you following your procedure. If we do not reach you, we will leave a message.     If any biopsies were taken you will be contacted by phone or by letter within the next 1-3 weeks.  Please call us at [phone] if you have not heard about the biopsies in 3 weeks.    SIGNATURES/CONFIDENTIALITY: You and/or your care partner have signed paperwork which will be entered into your electronic medical record.  These signatures attest to the fact that that the information above on your After Visit Summary has been reviewed and is understood.  Full responsibility of the confidentiality of this discharge information lies with you and/or your care-partner.

## 2023-04-05 NOTE — Progress Notes (Signed)
Pt's states no medical or surgical changes since previsit or office visit. 

## 2023-04-06 ENCOUNTER — Telehealth: Payer: Self-pay

## 2023-04-06 NOTE — Telephone Encounter (Signed)
Follow up call to pt, lm for pt to call if having any difficulty with normal activities or eating and drinking.  Also to call if any other questions or concerns.  

## 2023-04-08 ENCOUNTER — Encounter: Payer: Self-pay | Admitting: Infectious Diseases

## 2023-04-08 ENCOUNTER — Ambulatory Visit (INDEPENDENT_AMBULATORY_CARE_PROVIDER_SITE_OTHER): Payer: MEDICAID | Admitting: Infectious Diseases

## 2023-04-08 ENCOUNTER — Other Ambulatory Visit: Payer: Self-pay

## 2023-04-08 VITALS — BP 130/87 | HR 87 | Temp 98.5°F | Ht 68.0 in | Wt 190.0 lb

## 2023-04-08 DIAGNOSIS — N179 Acute kidney failure, unspecified: Secondary | ICD-10-CM

## 2023-04-08 DIAGNOSIS — B20 Human immunodeficiency virus [HIV] disease: Secondary | ICD-10-CM | POA: Diagnosis not present

## 2023-04-08 DIAGNOSIS — Z23 Encounter for immunization: Secondary | ICD-10-CM

## 2023-04-08 DIAGNOSIS — N898 Other specified noninflammatory disorders of vagina: Secondary | ICD-10-CM

## 2023-04-08 DIAGNOSIS — N1831 Chronic kidney disease, stage 3a: Secondary | ICD-10-CM

## 2023-04-08 DIAGNOSIS — Z7185 Encounter for immunization safety counseling: Secondary | ICD-10-CM

## 2023-04-08 LAB — SURGICAL PATHOLOGY

## 2023-04-08 NOTE — Assessment & Plan Note (Signed)
Check BMP today

## 2023-04-08 NOTE — Patient Instructions (Signed)
Will get your flu and covid vaccines today  Continue your Juluca once a day   Stop by the lab on your way out to make sure all looks great  Depending on how your EKG looks today I may have you hold off on the medication for the vaginal drainage. Stop the boric acid and see how it goes.   Otherwise will see you back in 3-4 months - schedule to your preference

## 2023-04-08 NOTE — Assessment & Plan Note (Signed)
Flu and covid shot today

## 2023-04-08 NOTE — Assessment & Plan Note (Addendum)
Due to candida glabratta - improved with month course of boric acid though tough for her to tolerate.  Considered a few days of higher dose fluconazole today (400mg ) but with Seroquel 300 mg daily not sure it's worth the QTc risk. EKG done and 386 ms today. Will have her monitor for now.   Consider single dose Ibrexafungerp.

## 2023-04-08 NOTE — Assessment & Plan Note (Signed)
Doing well on juluca once daily with food since change. Check VL today for therapeutic monitoring.  Return in about 4 months (around 08/08/2023).

## 2023-04-08 NOTE — Assessment & Plan Note (Signed)
Check BMP today   Lab Results  Component Value Date   CREATININE 1.14 (H) 12/30/2022

## 2023-04-08 NOTE — Progress Notes (Signed)
Brief Narrative   Patient ID: Tracy [name], female    DOB: Apr 11, 1966, 57 y.o.   MRN: [id number]   Tracy [name] is a 57 y/o female diagnosed with HIV disease in 2000 with unknown risk factor. Initial CD4 count and viral load are unavailable. Did not start treatment until 2007. No history of opportunistic infection. Genotype reviewed with no significant medication resistant mutations. ART experienced with Kaletra/Trivizir, Epzicom/Viread/Prezista/Norvir, and Complera/Tivicay.  Changed to Juluca in 2024 for CKD3 preferred regimen.    Subjective:    Chief Complaint  Patient presents with   Follow-up    HPI:  Tracy [name] is a 57 y.o. female with HIV, well controlled.  Recently switched 2m ago to [place] single tablet for consideration of long term kidney protection. Loves having only 1 pill now for HIV medication.   Has a new patient appt with new PCP with [facility].   Vaginal drainage is certainly better but not completely gone. Did have a fair amount of irritation with the boric acid vaginal suppositories.    Allergies  Allergen Reactions   Tetracycline Rash   Glyceryl Stear-Peg 100 Stear Other (See Comments)   Pork-Derived Products Other (See Comments)    Religious practice   Cyclobenzaprine Rash and Itching   Moxifloxacin Rash   Tetracyclines & Related Itching and Rash   Tramadol Itching and Rash      Outpatient Medications Prior to Visit  Medication Sig Dispense Refill   buPROPion (WELLBUTRIN XL) 150 MG 24 hr tablet Take 150 mg by mouth every morning.     dolutegravir-rilpivirine (JULUCA) 50-25 MG tablet Take 1 tablet by mouth daily before lunch. 30 tablet 5   lamoTRIgine (LAMICTAL) 25 MG tablet Take 2 tablets (50 mg total) by mouth daily. (Patient taking differently: Take 100 mg by mouth daily.) 30 tablet 1   olmesartan (BENICAR) 20 MG tablet Take 20 mg by mouth daily.     QUEtiapine (SEROQUEL XR) 300 MG 24 hr tablet Take 1 tablet (300 mg total) by mouth at bedtime.  (Patient taking differently: Take 400 mg by mouth at bedtime.) 30 tablet 1   rivaroxaban (XARELTO) 20 MG TABS tablet Take 20 mg by mouth at bedtime.     No facility-administered medications prior to visit.     Past Medical History:  Diagnosis Date   Arthritis    Chronic back pain    Chronic headaches    Chronic kidney disease    Depression    HIV infection (HCC)    Joint pain    Localized swelling of both lower legs    Legs, feet and hands   Pre-diabetes    Pulmonary embolism (HCC)    on Xarelto   Schizoaffective disorder (HCC)    Seasonal allergies    Stroke (cerebrum) (HCC)    patient denies, states it was a heat stroke   Weight loss      Past Surgical History:  Procedure Laterality Date   ABDOMINAL EXPOSURE N/A 07/27/2018   Procedure: ABDOMINAL EXPOSURE;  Surgeon: Nada Libman, MD;  Location: [facility] OR;  Service: Vascular;  Laterality: N/A;   ABDOMINAL HYSTERECTOMY     ANTERIOR LUMBAR FUSION N/A 07/27/2018   Procedure: ANTERIOR LUMBAR FUSION L5-S1;  Surgeon: Venita Lick, MD;  Location: [facility] OR;  Service: Orthopedics;  Laterality: N/A;  3 hrs/ Dr. Myra Gianotti to do approach HIV Positive   BREAST EXCISIONAL BIOPSY Right 1990's   BREAST SURGERY     TRANSFORAMINAL LUMBAR INTERBODY FUSION (TLIF) WITH PEDICLE  SCREW FIXATION 1 LEVEL N/A 10/02/2021   Procedure: TRANSFORAMINAL LUMBAR INTERBODY FUSION LUMBAR FOUR THROUGH FIVE;  Surgeon: Venita Lick, MD;  Location: [facility] OR;  Service: Orthopedics;  Laterality: N/A;      Review of Systems  Constitutional:  Negative for appetite change, chills, diaphoresis, fatigue, fever and unexpected weight change.  Eyes:        Negative for acute change in vision  Respiratory:  Negative for chest tightness, shortness of breath and wheezing.   Cardiovascular:  Negative for chest pain.  Gastrointestinal:  Negative for diarrhea, nausea and vomiting.  Genitourinary:  Negative for dysuria, pelvic pain and vaginal discharge.  Musculoskeletal:   Negative for neck pain and neck stiffness.  Skin:  Negative for rash.  Neurological:  Negative for seizures, syncope, weakness and headaches.  Hematological:  Negative for adenopathy. Does not bruise/bleed easily.  Psychiatric/Behavioral:  Negative for hallucinations.       Objective:    BP 130/87   Pulse 87   Temp 98.5 F (36.9 C) (Oral)   Ht 5\' 8"  (1.727 m)   Wt 190 lb (86.2 kg)   SpO2 99%   BMI 28.89 kg/m  Nursing note and vital signs reviewed.  Physical Exam Constitutional:      General: She is not in acute distress.    Appearance: She is well-developed.  Eyes:     Conjunctiva/sclera: Conjunctivae normal.  Cardiovascular:     Rate and Rhythm: Normal rate and regular rhythm.     Heart sounds: Normal heart sounds. No murmur heard.    No friction rub. No gallop.  Pulmonary:     Effort: Pulmonary effort is normal. No respiratory distress.     Breath sounds: Normal breath sounds. No wheezing or rales.  Chest:     Chest wall: No tenderness.  Abdominal:     General: Bowel sounds are normal.     Palpations: Abdomen is soft.     Tenderness: There is no abdominal tenderness.  Musculoskeletal:     Cervical back: Neck supple.  Lymphadenopathy:     Cervical: No cervical adenopathy.  Skin:    General: Skin is warm and dry.     Findings: No rash.  Neurological:     Mental Status: She is alert and oriented to person, place, and time.  Psychiatric:        Behavior: Behavior normal.        Thought Content: Thought content normal.        Judgment: Judgment normal.         04/08/2023    8:41 AM 03/10/2023    3:39 PM 02/02/2023    9:29 AM 12/23/2022    8:37 AM 02/25/2022    3:57 PM  Depression screen PHQ 2/9  Decreased Interest 0 0 0 0 0  Down, Depressed, Hopeless 1 1 0 0 1  PHQ - 2 Score 1 1 0 0 1       Assessment & Plan:    Patient Active Problem List   Diagnosis Date Noted   Screening for colon cancer 03/05/2023   Chronic anticoagulation 03/05/2023   Vaginal  discharge 02/02/2023   Screening for cervical cancer 02/02/2023   Syncope and collapse 12/29/2022   Orthostatic hypotension 12/29/2022   Chronic kidney disease, stage III (moderate) (HCC) 12/23/2022   Healthcare maintenance 12/23/2022   S/P lumbar fusion 10/02/2021   Vaccine counseling 08/12/2021   Exposure to hepatitis C 08/12/2021   Degenerative disc disease at L5-S1 level 07/27/2018  Schizophrenia, acute (HCC) 06/20/2018   Conversion disorder    Cervical radiculopathy    Chronic headaches 12/29/2016   Breast pain, left 05/25/2016   Dry skin dermatitis 02/10/2016   Itching due to drug 12/26/2015   HIV disease (HCC) 10/03/2015   Schizoaffective disorder (HCC) 10/03/2015   Chronic pain syndrome 10/03/2015   Cigarette smoker 10/03/2015     Problem List Items Addressed This Visit       Unprioritized   RESOLVED: Acute renal failure superimposed on stage 3a chronic kidney disease (HCC)    Check BMP today       Chronic kidney disease, stage III (moderate) (HCC)    Check BMP today   Lab Results  Component Value Date   CREATININE 1.14 (H) 12/30/2022         Relevant Orders   COMPLETE METABOLIC PANEL WITH GFR   HIV disease (HCC) - Primary    Doing well on juluca once daily with food since change. Check VL today for therapeutic monitoring.  Return in about 4 months (around 08/08/2023).       Relevant Orders   HIV 1 RNA quant-no reflex-bld   Vaccine counseling    Flu and covid shot today       Vaginal discharge    Due to candida glabratta - improved with month course of boric acid though tough for her to tolerate.  Considered a few days of higher dose fluconazole today (400mg ) but with Seroquel 300 mg daily not sure it's worth the QTc risk. EKG done and 386 ms today. Will have her monitor for now.   Consider single dose Ibrexafungerp.       Other Visit Diagnoses     Encounter for immunization       Relevant Orders   Flu vaccine trivalent PF, 6mos and  older(Flulaval,Afluria,Fluarix,Fluzone) (Completed)   Pfizer Comirnaty Covid-19 Vaccine 56yrs & older (Completed)        Rexene Alberts, MSN, NP-C [facility]  [email or web address] Pager: [phone] Office: [phone] [facility] Main Line: [phone] *Secure Chat Communication Welcome

## 2023-04-10 LAB — COMPLETE METABOLIC PANEL WITH GFR
AG Ratio: 2.2 (calc) (ref 1.0–2.5)
ALT: 11 U/L (ref 6–29)
AST: 13 U/L (ref 10–35)
Albumin: 4.7 g/dL (ref 3.6–5.1)
Alkaline phosphatase (APISO): 79 U/L (ref 37–153)
BUN/Creatinine Ratio: 10 (calc) (ref 6–22)
BUN: 10 mg/dL (ref 7–25)
CO2: 23 mmol/L (ref 20–32)
Calcium: 9.6 mg/dL (ref 8.6–10.4)
Chloride: 109 mmol/L (ref 98–110)
Creat: 1.05 mg/dL — ABNORMAL HIGH (ref 0.50–1.03)
Globulin: 2.1 g/dL (ref 1.9–3.7)
Glucose, Bld: 93 mg/dL (ref 65–99)
Potassium: 3.9 mmol/L (ref 3.5–5.3)
Sodium: 142 mmol/L (ref 135–146)
Total Bilirubin: 0.6 mg/dL (ref 0.2–1.2)
Total Protein: 6.8 g/dL (ref 6.1–8.1)
eGFR: 63 mL/min/{1.73_m2} (ref >=60)

## 2023-04-10 LAB — HIV-1 RNA QUANT-NO REFLEX-BLD
HIV 1 RNA Quant: NOT DETECTED {copies}/mL
HIV-1 RNA Quant, Log: NOT DETECTED {Log}

## 2023-06-01 ENCOUNTER — Other Ambulatory Visit: Payer: Self-pay

## 2023-06-15 ENCOUNTER — Ambulatory Visit: Payer: Self-pay | Admitting: Family

## 2023-07-19 ENCOUNTER — Ambulatory Visit: Payer: MEDICAID | Admitting: Infectious Diseases

## 2023-07-27 ENCOUNTER — Ambulatory Visit (INDEPENDENT_AMBULATORY_CARE_PROVIDER_SITE_OTHER): Payer: MEDICAID | Admitting: Infectious Diseases

## 2023-07-27 ENCOUNTER — Encounter: Payer: Self-pay | Admitting: Infectious Diseases

## 2023-07-27 ENCOUNTER — Other Ambulatory Visit: Payer: Self-pay

## 2023-07-27 ENCOUNTER — Ambulatory Visit: Payer: MEDICAID | Admitting: Licensed Clinical Social Worker

## 2023-07-27 VITALS — BP 138/91 | HR 83 | Temp 98.0°F | Ht 68.0 in | Wt 185.0 lb

## 2023-07-27 DIAGNOSIS — N189 Chronic kidney disease, unspecified: Secondary | ICD-10-CM | POA: Diagnosis not present

## 2023-07-27 DIAGNOSIS — F250 Schizoaffective disorder, bipolar type: Secondary | ICD-10-CM

## 2023-07-27 DIAGNOSIS — N1831 Chronic kidney disease, stage 3a: Secondary | ICD-10-CM

## 2023-07-27 DIAGNOSIS — B20 Human immunodeficiency virus [HIV] disease: Secondary | ICD-10-CM | POA: Diagnosis not present

## 2023-07-27 NOTE — Progress Notes (Signed)
 Brief Narrative   Patient ID: Tracy [name], female    DOB: [birth year], 58 y.o.   MRN: [id number]   Tracy [name] is a 58 y/o female diagnosed with HIV disease in 2000 with unknown risk factor. Initial CD4 count and viral load are unavailable. Did not start treatment until 2007.  No history of opportunistic infection. Genotype reviewed with no significant medication resistant mutations. ART experienced with Kaletra/Trivizir, Epzicom/Viread /Prezista Inocencio, and Complera /Tivicay .  Changed to Juluca  in 2024 for CKD3 preferred regimen.    Subjective   Subjective:    Chief Complaint  Patient presents with   Follow-up    Discussed the use of AI scribe software for clinical note transcription with the patient, who gave verbal consent to proceed.  History of Present Illness   Tracy [name] is here for HIV follow up care and check in on kidneys. She has a history of schizoaffective disorder, presented with an increase in suicidal thoughts. She reported no active planning but expressed concern about the progression of these thoughts due to past suicide attempts, the most recent of which occurred in 2023. The patient has been on a stable regimen of Seroquel , lamotrigine , and Wellbutrin  for over ten years. She has previously found counseling helpful as part of her treatment. She is asking for a new psychiatrist as she did not connect with the one she saw at [facility].  In addition to her mental health concerns, the patient reported a recent issue with her eyes, specifically a persistent twitch in one eye. She also noted some difficulty with distance vision and a need to adjust her eyes for close reading.  The patient also reported a recent issue with vaginal discharge, which has since resolved after treatment with boric acid - 1 month course was effective. She has been in a stable sexual relationship for eighteen years and reported no changes in sexual partners, condom use, or use of vaginal soaps. She  denied any pain during sex.  Lastly, the patient expressed concern about her kidney function. She has been monitoring her blood pressure at home, which has remained within acceptable limits. She reported no use of Aleve or ibuprofen, only Tylenol , due to her blood thinning medication.       Allergies  Allergen Reactions   Tetracycline Rash   Glyceryl Stear-Peg 100 Stear Other (See Comments)   Pork-Derived Products Other (See Comments)    Religious practice   Cyclobenzaprine Rash and Itching   Moxifloxacin Rash   Tetracyclines & Related Itching and Rash   Tramadol Itching and Rash      Outpatient Medications Prior to Visit  Medication Sig Dispense Refill   buPROPion  (WELLBUTRIN  XL) 150 MG 24 hr tablet Take 150 mg by mouth every morning.     dolutegravir -rilpivirine  (JULUCA ) 50-25 MG tablet Take 1 tablet by mouth daily before lunch. 30 tablet 5   lamoTRIgine  (LAMICTAL ) 25 MG tablet Take 2 tablets (50 mg total) by mouth daily. (Patient taking differently: Take 100 mg by mouth daily.) 30 tablet 1   olmesartan (BENICAR) 20 MG tablet Take 20 mg by mouth daily.     QUEtiapine  (SEROQUEL  XR) 300 MG 24 hr tablet Take 1 tablet (300 mg total) by mouth at bedtime. (Patient taking differently: Take 400 mg by mouth at bedtime.) 30 tablet 1   rivaroxaban  (XARELTO ) 20 MG TABS tablet Take 20 mg by mouth at bedtime.     No facility-administered medications prior to visit.     Past Medical History:  Diagnosis Date  Arthritis    Chronic back pain    Chronic headaches    Chronic kidney disease    Depression    HIV infection (HCC)    Joint pain    Localized swelling of both lower legs    Legs, feet and hands   Pre-diabetes    Pulmonary embolism (HCC)    on Xarelto    Schizoaffective disorder (HCC)    Seasonal allergies    Stroke (cerebrum) (HCC)    patient denies, states it was a heat stroke   Weight loss      Past Surgical History:  Procedure Laterality Date   ABDOMINAL EXPOSURE  N/A 07/27/2018   Procedure: ABDOMINAL EXPOSURE;  Surgeon: Serene Gaile ORN, MD;  Location: [facility] OR;  Service: Vascular;  Laterality: N/A;   ABDOMINAL HYSTERECTOMY     ANTERIOR LUMBAR FUSION N/A 07/27/2018   Procedure: ANTERIOR LUMBAR FUSION L5-S1;  Surgeon: Burnetta Aures, MD;  Location: [facility] OR;  Service: Orthopedics;  Laterality: N/A;  3 hrs/ Dr. Serene to do approach HIV Positive   BREAST EXCISIONAL BIOPSY Right 1990's   BREAST SURGERY     TRANSFORAMINAL LUMBAR INTERBODY FUSION (TLIF) WITH PEDICLE SCREW FIXATION 1 LEVEL N/A 10/02/2021   Procedure: TRANSFORAMINAL LUMBAR INTERBODY FUSION LUMBAR FOUR THROUGH FIVE;  Surgeon: Burnetta Aures, MD;  Location: [facility] OR;  Service: Orthopedics;  Laterality: N/A;      Review of Systems  Constitutional:  Negative for appetite change, chills, diaphoresis, fatigue, fever and unexpected weight change.  Eyes:        Negative for acute change in vision  Respiratory:  Negative for chest tightness, shortness of breath and wheezing.   Cardiovascular:  Negative for chest pain.  Gastrointestinal:  Negative for diarrhea, nausea and vomiting.  Genitourinary:  Negative for dysuria, pelvic pain and vaginal discharge.  Musculoskeletal:  Negative for neck pain and neck stiffness.  Skin:  Negative for rash.  Neurological:  Negative for seizures, syncope, weakness and headaches.  Hematological:  Negative for adenopathy. Does not bruise/bleed easily.  Psychiatric/Behavioral:  Negative for hallucinations.       Objective:    BP (!) 138/91   Pulse 83   Temp 98 F (36.7 C) (Temporal)   Ht 5' 8 (1.727 m)   Wt 185 lb (83.9 kg)   SpO2 97%   BMI 28.13 kg/m  Nursing note and vital signs reviewed.  Physical Exam Constitutional:      General: She is not in acute distress.    Appearance: She is well-developed.  Eyes:     Conjunctiva/sclera: Conjunctivae normal.  Cardiovascular:     Rate and Rhythm: Normal rate and regular rhythm.     Heart sounds: Normal heart  sounds. No murmur heard.    No friction rub. No gallop.  Pulmonary:     Effort: Pulmonary effort is normal. No respiratory distress.     Breath sounds: Normal breath sounds. No wheezing or rales.  Chest:     Chest wall: No tenderness.  Abdominal:     General: Bowel sounds are normal.     Palpations: Abdomen is soft.     Tenderness: There is no abdominal tenderness.  Musculoskeletal:     Cervical back: Neck supple.  Lymphadenopathy:     Cervical: No cervical adenopathy.  Skin:    General: Skin is warm and dry.     Findings: No rash.  Neurological:     Mental Status: She is alert and oriented to person, place, and time.  Psychiatric:  Behavior: Behavior normal.        Thought Content: Thought content normal.        Judgment: Judgment normal.         07/27/2023   12:57 PM 04/08/2023    8:41 AM 03/10/2023    3:39 PM 02/02/2023    9:29 AM 12/23/2022    8:37 AM  Depression screen PHQ 2/9  Decreased Interest 0 0 0 0 0  Down, Depressed, Hopeless 0 1 1 0 0  PHQ - 2 Score 0 1 1 0 0       Assessment & Plan:     Eye Twitching and Vision Changes - Patient reports persistent eye twitching and changes in vision, both near and far. Possible stress or caffeine-induced muscle twitching, but also potential need for corrective lenses due to vision changes. -Refer to [facility] for comprehensive eye examination. Information provided today   Schizoaffective Disorder -  Worsening of Depressive Features -  Patient reports increased suicidal thoughts, but no active planning. History of suicide attempts, most recent in 2023. Currently on Seroquel , Lamotrigine , and Wellbutrin . -Schedule urgent referral to [facility]. -Schedule appointment with in-house counselor, Reche, for interim support. Can transition to [facility] if regular appointments will more flexible scheduling needed.   Vaginal Discharge -  Resolved after treatment with boric acid. No new sexual  partners, no changes in soaps, no douching. -Advise patient to repeat boric acid treatment if symptoms recur.  Chronic Kidney Disease - Recent creatinine levels improved after medication change to Juluca . Patient reports good blood pressure control at home. -Order labs to recheck kidney function today. -Advise patient to maintain good hydration and avoid NSAIDs.  Follow-up in 3 months to monitor kidney function and mental health status.      No orders of the defined types were placed in this encounter.  Orders Placed This Encounter  Procedures   COMPLETE METABOLIC PANEL WITH GFR   HIV 1 RNA quant-no reflex-bld   Ambulatory referral to Psychiatry    Referral Priority:   Urgent    Referral Type:   Psychiatric    Referral Reason:   Specialty Services Required    Requested Specialty:   Psychiatry    Number of Visits Requested:   1   Return in about 3 months (around 10/25/2023).   Corean Fireman, MSN, NP-C [facility]  [email or web address] Pager: [phone] Office: [phone] [facility] Main Line: [phone] *Secure Chat Communication Welcome

## 2023-07-27 NOTE — Patient Instructions (Addendum)
 Please schedule an appointment with our counselor Reche next Tuesday (or if she has any openings today we can try to fit you in with her today if you have time).   Eye Doctor Recommendation -  [facility]  [address], [address] [place], [place] [address] [phone]    [facility]:  Phone: [phone] Address: [address] [place], [place] [address]  Open 24/7, No appointment required.  More Information about the [facility] Additional Resources-- [employer]: 1-800-SUICIDE [employer] Suicide Prevention Lifeline: [employer]

## 2023-07-28 ENCOUNTER — Other Ambulatory Visit: Payer: Self-pay

## 2023-07-28 DIAGNOSIS — B20 Human immunodeficiency virus [HIV] disease: Secondary | ICD-10-CM

## 2023-07-28 MED ORDER — JULUCA 50-25 MG PO TABS: 1.0000 | ORAL_TABLET | Freq: Every day | ORAL | 3 refills | Status: DC

## 2023-07-29 LAB — HIV-1 RNA QUANT-NO REFLEX-BLD
HIV 1 RNA Quant: NOT DETECTED {copies}/mL
HIV-1 RNA Quant, Log: NOT DETECTED {Log}

## 2023-07-29 LAB — COMPLETE METABOLIC PANEL WITH GFR
AG Ratio: 2.1 (calc) (ref 1.0–2.5)
ALT: 14 U/L (ref 6–29)
AST: 12 U/L (ref 10–35)
Albumin: 4.8 g/dL (ref 3.6–5.1)
Alkaline phosphatase (APISO): 76 U/L (ref 37–153)
BUN/Creatinine Ratio: 18 (calc) (ref 6–22)
BUN: 21 mg/dL (ref 7–25)
CO2: 26 mmol/L (ref 20–32)
Calcium: 10 mg/dL (ref 8.6–10.4)
Chloride: 104 mmol/L (ref 98–110)
Creat: 1.16 mg/dL — ABNORMAL HIGH (ref 0.50–1.03)
Globulin: 2.3 g/dL (ref 1.9–3.7)
Glucose, Bld: 85 mg/dL (ref 65–99)
Potassium: 4.5 mmol/L (ref 3.5–5.3)
Sodium: 140 mmol/L (ref 135–146)
Total Bilirubin: 0.5 mg/dL (ref 0.2–1.2)
Total Protein: 7.1 g/dL (ref 6.1–8.1)
eGFR: 55 mL/min/{1.73_m2} — ABNORMAL LOW (ref >=60)

## 2023-08-10 ENCOUNTER — Ambulatory Visit: Payer: MEDICAID | Admitting: Licensed Clinical Social Worker

## 2023-08-10 DIAGNOSIS — Z0100 Encounter for examination of eyes and vision without abnormal findings: Secondary | ICD-10-CM

## 2023-08-10 DIAGNOSIS — H579 Unspecified disorder of eye and adnexa: Secondary | ICD-10-CM

## 2023-08-10 DIAGNOSIS — G245 Blepharospasm: Secondary | ICD-10-CM
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
## 2023-09-08 ENCOUNTER — Ambulatory Visit (INDEPENDENT_AMBULATORY_CARE_PROVIDER_SITE_OTHER): Payer: MEDICAID | Admitting: Psychiatry

## 2023-09-08 ENCOUNTER — Encounter (HOSPITAL_COMMUNITY): Payer: Self-pay | Admitting: Psychiatry

## 2023-09-08 VITALS — BP 143/94 | HR 81 | Temp 98.7°F | Ht 68.0 in | Wt 206.0 lb

## 2023-09-08 DIAGNOSIS — F50811 Binge eating disorder, moderate: Secondary | ICD-10-CM | POA: Diagnosis not present

## 2023-09-08 DIAGNOSIS — F22 Delusional disorders: Secondary | ICD-10-CM

## 2023-09-08 DIAGNOSIS — F32A Depression, unspecified: Secondary | ICD-10-CM

## 2023-09-08 DIAGNOSIS — F250 Schizoaffective disorder, bipolar type: Secondary | ICD-10-CM

## 2023-09-08 DIAGNOSIS — Z79899 Other long term (current) drug therapy: Secondary | ICD-10-CM

## 2023-09-08 DIAGNOSIS — F4310 Post-traumatic stress disorder, unspecified: Secondary | ICD-10-CM | POA: Insufficient documentation

## 2023-09-08 DIAGNOSIS — F411 Generalized anxiety disorder: Secondary | ICD-10-CM | POA: Diagnosis not present

## 2023-09-08 DIAGNOSIS — F17210 Nicotine dependence, cigarettes, uncomplicated: Secondary | ICD-10-CM

## 2023-09-08 DIAGNOSIS — F50819 Binge eating disorder, unspecified: Secondary | ICD-10-CM | POA: Insufficient documentation

## 2023-09-08 DIAGNOSIS — G479 Sleep disorder, unspecified: Secondary | ICD-10-CM

## 2023-09-08 DIAGNOSIS — F333 Major depressive disorder, recurrent, severe with psychotic symptoms: Secondary | ICD-10-CM | POA: Insufficient documentation

## 2023-09-08 MED ORDER — LAMOTRIGINE 100 MG PO TABS: 100.0000 mg | ORAL_TABLET | Freq: Every day | ORAL | 1 refills | Status: DC

## 2023-09-08 MED ORDER — QUETIAPINE FUMARATE ER 300 MG PO TB24: 600.0000 mg | ORAL_TABLET | Freq: Every day | ORAL | 1 refills | Status: DC

## 2023-09-10 DIAGNOSIS — Z79899 Other long term (current) drug therapy: Secondary | ICD-10-CM | POA: Insufficient documentation

## 2023-10-04 ENCOUNTER — Encounter (HOSPITAL_COMMUNITY): Payer: Self-pay

## 2023-10-04 ENCOUNTER — Telehealth (HOSPITAL_COMMUNITY): Payer: Self-pay | Admitting: Mental Health

## 2023-10-04 ENCOUNTER — Ambulatory Visit (HOSPITAL_COMMUNITY): Payer: MEDICAID | Admitting: Mental Health
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
## 2023-10-19 ENCOUNTER — Encounter: Payer: Self-pay | Admitting: Infectious Diseases

## 2023-10-19 ENCOUNTER — Other Ambulatory Visit: Payer: Self-pay

## 2023-10-19 ENCOUNTER — Ambulatory Visit (INDEPENDENT_AMBULATORY_CARE_PROVIDER_SITE_OTHER): Payer: MEDICAID | Admitting: Infectious Diseases

## 2023-10-19 VITALS — BP 132/85 | HR 88 | Resp 16 | Ht 68.0 in | Wt 211.0 lb

## 2023-10-19 DIAGNOSIS — R413 Other amnesia: Secondary | ICD-10-CM

## 2023-10-19 DIAGNOSIS — Z79899 Other long term (current) drug therapy: Secondary | ICD-10-CM | POA: Diagnosis not present

## 2023-10-19 DIAGNOSIS — B20 Human immunodeficiency virus [HIV] disease: Secondary | ICD-10-CM

## 2023-10-19 DIAGNOSIS — N1831 Chronic kidney disease, stage 3a: Secondary | ICD-10-CM | POA: Diagnosis not present

## 2023-10-19 NOTE — Assessment & Plan Note (Signed)
 Reviewed labs from January 2025. Kidney function remains stable. Continue Juluca daily. Repeat labs in 4-6 months.

## 2023-10-19 NOTE — Assessment & Plan Note (Signed)
 Continue Juluca daily. Denies condoms today. Continues to remain virologically suppressed with excellent CD4 count. Kidney function remains stable on current regimen - plan to continue to monitor labs and adjust as needed.

## 2023-10-19 NOTE — Patient Instructions (Addendum)
 Nice to see you -   Continue your Juluca with food today.   Will place a referral for your memory troubles [facility]  Address: [address] [place], [place] [address] Phone: [phone]  Will see you back in 3 months to check in

## 2023-10-19 NOTE — Progress Notes (Signed)
 Brief Narrative   Patient ID: Tracy [name], female    DOB: 1966/02/18, 58 y.o.   MRN: [id number]   [name] is a 58 y/o female diagnosed with HIV disease in 2000 with unknown risk factor. Initial CD4 count and viral load are unavailable. Did not start treatment until 2007.  No history of opportunistic infection. Genotype reviewed with no significant medication resistant mutations. ART experienced with Kaletra/Trivizir, Epzicom/Viread/Prezista/Norvir, and Complera/Tivicay.  Changed to Juluca in 2024 for CKD3 preferred regimen.    Subjective   Subjective:    Chief Complaint  Patient presents with   Follow-up     History of Present Illness   [name] presents today for 3 month follow up. Denies any new issues - continues to have slight memory lapses but denies balance or acute vision changes. She reports that she has an eye appointment with Dr. Lavona Mound in October 2025. Taking Juluca daily and denies missed doses or issues obtaining. Denies suicidal ideations or any mood changes. Reports feeling generally well and is in good spirits. Inquiring about lab work and kidney function results.      Allergies  Allergen Reactions   Tetracycline Rash   Glyceryl Stear-Peg 100 Stear Other (See Comments)   Pork-Derived Products Other (See Comments)    Religious practice   Cyclobenzaprine Rash and Itching   Moxifloxacin Rash   Tetracyclines & Related Itching and Rash   Tramadol Itching and Rash      Outpatient Medications Prior to Visit  Medication Sig Dispense Refill   amLODipine (NORVASC) 5 MG tablet Take 5 mg by mouth daily.     dolutegravir-rilpivirine (JULUCA) 50-25 MG tablet Take 1 tablet by mouth daily before lunch. 30 tablet 3   lamoTRIgine (LAMICTAL) 100 MG tablet Take 1 tablet (100 mg total) by mouth daily. 30 tablet 1   QUEtiapine (SEROQUEL XR) 300 MG 24 hr tablet Take 2 tablets (600 mg total) by mouth at bedtime. 60 tablet 1   rivaroxaban (XARELTO) 20 MG TABS tablet  Take 20 mg by mouth at bedtime.     olmesartan (BENICAR) 20 MG tablet Take 20 mg by mouth daily.     No facility-administered medications prior to visit.     Past Medical History:  Diagnosis Date   Arthritis    Chronic back pain    Chronic headaches    Chronic kidney disease    Depression    HIV infection (HCC)    Joint pain    Localized swelling of both lower legs    Legs, feet and hands   Pre-diabetes    Pulmonary embolism (HCC)    on Xarelto   Schizoaffective disorder (HCC)    Seasonal allergies    Stroke (cerebrum) (HCC)    patient denies, states it was a heat stroke   Weight loss      Past Surgical History:  Procedure Laterality Date   ABDOMINAL EXPOSURE N/A 07/27/2018   Procedure: ABDOMINAL EXPOSURE;  Surgeon: Nada Libman, MD;  Location: [facility];  Service: Vascular;  Laterality: N/A;   ABDOMINAL HYSTERECTOMY     ANTERIOR LUMBAR FUSION N/A 07/27/2018   Procedure: ANTERIOR LUMBAR FUSION L5-S1;  Surgeon: Venita Lick, MD;  Location: [facility];  Service: Orthopedics;  Laterality: N/A;  3 hrs/ Dr. Myra Gianotti to do approach HIV Positive   BREAST EXCISIONAL BIOPSY Right 1990's   BREAST SURGERY     TRANSFORAMINAL LUMBAR INTERBODY FUSION (TLIF) WITH PEDICLE SCREW FIXATION 1 LEVEL N/A 10/02/2021   Procedure:  TRANSFORAMINAL LUMBAR INTERBODY FUSION LUMBAR FOUR THROUGH FIVE;  Surgeon: Venita Lick, MD;  Location: [facility];  Service: Orthopedics;  Laterality: N/A;      Review of Systems  Constitutional:  Negative for appetite change, chills, diaphoresis, fatigue, fever and unexpected weight change.  Eyes:        Negative for acute change in vision  Respiratory:  Negative for chest tightness, shortness of breath and wheezing.   Cardiovascular:  Negative for chest pain.  Gastrointestinal:  Negative for diarrhea, nausea and vomiting.  Genitourinary:  Negative for dysuria, pelvic pain and vaginal discharge.  Musculoskeletal:  Negative for neck pain and neck stiffness.  Skin:   Negative for rash.  Neurological:  Negative for seizures, syncope, weakness and headaches.  Hematological:  Negative for adenopathy. Does not bruise/bleed easily.  Psychiatric/Behavioral:  Negative for hallucinations.        Memory lapses - reports forgetting 'little stuff'      Objective:    BP 132/85   Pulse 88   Resp 16   Ht 5\' 8"  (1.727 m)   Wt 211 lb (95.7 kg)   BMI 32.08 kg/m  Nursing note and vital signs reviewed.  Physical Exam Constitutional:      General: She is not in acute distress.    Appearance: She is well-developed.  Eyes:     Conjunctiva/sclera: Conjunctivae normal.  Cardiovascular:     Rate and Rhythm: Normal rate and regular rhythm.     Heart sounds: Normal heart sounds. No murmur heard.    No friction rub. No gallop.  Pulmonary:     Effort: Pulmonary effort is normal. No respiratory distress.     Breath sounds: Normal breath sounds. No wheezing or rales.  Chest:     Chest wall: No tenderness.  Abdominal:     General: Bowel sounds are normal.     Palpations: Abdomen is soft.     Tenderness: There is no abdominal tenderness.  Musculoskeletal:     Cervical back: Neck supple.  Lymphadenopathy:     Cervical: No cervical adenopathy.  Skin:    General: Skin is warm and dry.     Findings: No rash.  Neurological:     Mental Status: She is alert and oriented to person, place, and time.  Psychiatric:        Behavior: Behavior normal.        Thought Content: Thought content normal.        Judgment: Judgment normal.         10/19/2023   10:27 AM 09/08/2023    2:20 PM 07/27/2023   12:57 PM 04/08/2023    8:41 AM 03/10/2023    3:39 PM  Depression screen PHQ 2/9  Decreased Interest 0  0 0 0  Down, Depressed, Hopeless 0  0 1 1  PHQ - 2 Score 0  0 1 1  Altered sleeping       Tired, decreased energy       Change in appetite       Feeling bad or failure about yourself        Trouble concentrating       Moving slowly or fidgety/restless       Suicidal  thoughts       PHQ-9 Score       Difficult doing work/chores          Information is confidential and restricted. Go to Review Flowsheets to unlock data.       Assessment &  Plan:         No orders of the defined types were placed in this encounter.  Orders Placed This Encounter  Procedures   Ambulatory referral to Neurology    Referral Priority:   Routine    Referral Type:   Consultation    Referral Reason:   Specialty Services Required    Requested Specialty:   Neurology    Number of Visits Requested:   1   No follow-ups on file.   Rexene Alberts, MSN, NP-C [facility]  [email or web address] Pager: [phone] Office: [phone] [facility] Main Line: [phone] *Secure Chat Communication Welcome

## 2023-10-19 NOTE — Assessment & Plan Note (Signed)
 Continue Seroquel daily. Denies any suicidal ideations or significant changes in mood recently. Continue to monitor mental health needs and intervene as needed.

## 2023-10-19 NOTE — Progress Notes (Signed)
 I saw the patient with Gearldine Shown, NP Student and agree with the assessment and plan outlined below.   Discussed the use of AI scribe software for clinical note transcription with the patient, who gave verbal consent to proceed.  History of Present Illness   Tracy [name] is a 58 year old female who presents with memory concerns and routine HIV care follow up.  She experiences memory difficulties, including forgetting tasks shortly after planning them and occasionally getting lost in familiar areas. She needs to write notes or ask her mother (57 yo) for reminders. These memory lapses have occurred twice since January 2025. No issues with understanding many day to day executive functioning (handling money, paying bills, etc) although clock numbers sometimes appear to move. Her aunt had memory problems following surgery for a brain tumor but otherwise no problems noted in other family members   She underwent a full hysterectomy in April 2015, including removal of her ovaries, and noted hot flashes starting shortly thereafter. She experiences ongoing hot flashes. 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 Gabapentin was previously tried for hot flashes but was ineffective.  She has gained weight, increasing from 185 pounds in January 2025 to 211 pounds currently. She eats three meals and a snack daily, with a preference for rice, and drinks orange juice regularly. She does not consume soda and drinks a lot of water.  She is currently taking five medications, including Juluca, which she takes daily with food. She does not require medications for reflux and is not diabetic. She reports allergies but is not taking any medication for them.       Assessment and Plan    Memory impairment -  Differential diagnosis includes neurocognitive disorders, vitamin deficiencies, and thyroid dysfunction. Menopause-related  cognitive changes considered less likely given this is a new feature and other symptoms started 9 years ago. Neurology referral for comprehensive evaluation discussed. - Refer to neurology for a comprehensive memory evaluation. - Order vitamin B12 and folate levels. - Order TSH to assess thyroid function.  HIV infection -  On Juluca with no adherence issues. Viral load undetectable. - Repeat CD4 count.  Menopausal symptoms -  Experiences hot flashes. Hormone replacement therapy contraindicated due to pulmonary embolism history.   Weight gain -  Weight increased from 185 lbs to 211 lbs since January. Dietary modifications discussed. - Advise reducing portion sizes, especially of rice, potatoes, and pasta. - Recommend limiting orange juice to 2-3 days per week. - Encourage weighing herself a couple of times a week under consistent conditions.  Allergic rhinitis -  Reports allergy symptoms. Advised on safe over-the-counter options. - Recommend over-the-counter antihistamines such as Claritin, Zyrtec, or Allegra.  Follow-up -  Scheduled for regular follow-up visits every three months. Monitoring includes memory issues, weight, and HIV management. - Schedule follow-up visit in three months. - Provide neurology office phone number for scheduling an appointment.       Rexene Alberts, MSN, NP-C [facility] for Infectious Disease [facility]  [email or web address] Pager: [phone] Office: [phone] [facility] Main Line: [phone] *Secure Chat Communication Welcome

## 2023-10-20 LAB — T-HELPER CELLS (CD4) COUNT (NOT AT ARMC)
CD4 % Helper T Cell: 50 % (ref 33–65)
CD4 T Cell Abs: 980 /uL (ref 400–1790)

## 2023-10-21 LAB — COMPLETE METABOLIC PANEL WITHOUT GFR
AG Ratio: 1.9 (calc) (ref 1.0–2.5)
ALT: 10 U/L (ref 6–29)
AST: 13 U/L (ref 10–35)
Albumin: 4.6 g/dL (ref 3.6–5.1)
Alkaline phosphatase (APISO): 65 U/L (ref 37–153)
BUN/Creatinine Ratio: 15 (calc) (ref 6–22)
BUN: 18 mg/dL (ref 7–25)
CO2: 23 mmol/L (ref 20–32)
Calcium: 9.5 mg/dL (ref 8.6–10.4)
Chloride: 106 mmol/L (ref 98–110)
Creat: 1.2 mg/dL — ABNORMAL HIGH (ref 0.50–1.03)
Globulin: 2.4 g/dL (ref 1.9–3.7)
Glucose, Bld: 87 mg/dL (ref 65–99)
Potassium: 4.3 mmol/L (ref 3.5–5.3)
Sodium: 140 mmol/L (ref 135–146)
Total Bilirubin: 0.6 mg/dL (ref 0.2–1.2)
Total Protein: 7 g/dL (ref 6.1–8.1)

## 2023-10-21 LAB — TSH+FREE T4: TSH W/REFLEX TO FT4: 0.41 m[IU]/L (ref 0.40–4.50)

## 2023-10-21 LAB — B12 AND FOLATE PANEL
Folate: 16.4 ng/mL
Vitamin B-12: 354 pg/mL (ref 200–1100)

## 2023-10-21 LAB — HIV-1 RNA QUANT-NO REFLEX-BLD
HIV 1 RNA Quant: NOT DETECTED {copies}/mL
HIV-1 RNA Quant, Log: NOT DETECTED {Log_copies}/mL

## 2023-10-25 ENCOUNTER — Telehealth: Payer: Self-pay

## 2023-10-25 NOTE — Telephone Encounter (Signed)
-----   Message from [place] sent at 10/25/2023  4:26 PM EDT ----- Hi team  Can we please give her a call to let her know that all of her labs came back normal.  Her kidney function looks great - creatinine is only 1.2 - just a slight elevation.   Her liver function is great   None of the memory labs (vitamin b/folate or thyroid studies) came back abnormal.   I am hopeful she has a neurology appointment set up   Of course her viral load is undetectable and Immune system is nice and strong > 900.   No changes from me

## 2023-10-25 NOTE — Telephone Encounter (Signed)
Attempted to call patient to review results. Not able to reach her at this time. Will send mychart message. Juanita Laster, RMA

## 2023-10-27 ENCOUNTER — Encounter: Payer: Self-pay | Admitting: Physician Assistant

## 2023-10-28 ENCOUNTER — Other Ambulatory Visit: Payer: MEDICAID

## 2023-11-01 NOTE — Progress Notes (Unsigned)
 [facility] MD Outpatient Progress Note  11/03/2023 1:48 PM Tracy [name]  MRN:  [id number]  Assessment:  Tracy [name] presents for follow-up evaluation in-person. Today, 11/03/23, patient reports improved sleep and energy since her last visit.  She does report ongoing anhedonia throughout the day and has difficulty accomplishing her tasks.  She reports paranoia specifically in crowded places and has anxiety when she is in a place with many people.  She does screen positive for agoraphobia and she is agreeable to starting Zoloft  and as needed hydroxyzine  to aid with her anxiety.  I discussed the risks/benefits/adverse effects of these medications.  I also discussed follow-up with her PCP due to her elevated blood pressure during this visit and encouraged her to obtain an updated EKG.  Patient denies any issues with her current psychotropic medication regimen with Seroquel  and Lamictal .  Unfortunately there are no clinic openings until June so we will provide medication until the follow-up appointment in 2 months.  I encourage patient if any concerns arise to contact the clinic.  Identifying Information: Tracy [name] is a 58 y.o. female with a history of MDD with psychotic features who is an established patient with [facility] for management of depressive symptoms.   Plan:  #MDD with psychotic features vs.  Schizoaffective disorder bipolar type  Interventions: wellbutrin  -- Start Zoloft  25 mg daily for 1 week and if tolerant increase to 50 mg daily -- Continue Seroquel  600 mg nightly -- Continue Lamictal  100 mg daily   # GAD #Agoraphobia # PTSD Interventions: -- Start Zoloft  25 mg daily for 1 week and if tolerant increase to 50 mg daily - Start hydroxyzine  25 mg twice daily as needed for anxiety   # Binge eating disorder Interventions: wellbutrin  -- Recommend nutritionist   Patient was given contact information for behavioral health clinic and was instructed to call 911  for emergencies.   Subjective:  Chief Complaint:  Chief Complaint  Patient presents with   Follow-up   Depression   Anxiety    Interval History: Patient states that she feels well. She reports her mood is "up and down" stating that it is about the same as prior. She states that she is sleep more through the night and feels like she is more energetic throughout the day. She feels the Seroquel  is helping with her sleep- denying issues falling and staying asleep. She states waking up once in the middle of the night to urinate and going right back to bed. She reports some irritation with people's voices and hearing tv. She does note continued isolation and feels anxious about socializing. She states "I dont know who is against me and who is not". She states that she considered not coming to clinic because of lack of motivation. She reports lack of motivation to get through the day. She reports good appetite- reporting eating three meals daily and constantly eating snacks. She states its about the same amount as last time.  She denies feelings of guilt with overeating and she states she overeats "all the time".  She denies restrictive behaviors.  She reports not liking spring and summer because of crowded people. In crowds, patient tries to leave. She states having anxiety with crowds for years. She reports shopping in the mornings to avoid crowds. When asked if someone in the past, she states "it is a long story".   She denies SI, most recently 2 days ago and states it was the thought of "I dont want to be here  anymore". She denies active SI and states her husband helps her when she becomes depressed. She denies HI and AVH.  Visit Diagnosis:    ICD-10-CM   1. Schizoaffective disorder, bipolar type (HCC)  F25.0     2. GAD (generalized anxiety disorder)  F41.1     3. PTSD (post-traumatic stress disorder)  F43.10     4. Moderate binge-eating disorder  F50.811     5. Agoraphobia  F40.00        Past Psychiatric History:  Diagnoses: Depression, schizophrenia  Medication trials: Prozac (did not like like it- reports having increased suicidal thoughts), Paxil, Trazodone (did not feel like it worked) Current: Seroquel , Wellbutrin , Lamictal  Previous psychiatrist/therapist: Unsure when last time she saw one, thinks it was about one year ago; denies seeing a therapist but plan for a first appointment in March Hospitalizations: Yes, about 11 times, first time was 1990s due to suicide attempt and most recently 3 years ago due to SI Suicide attempts: 4-5 times- through overdosing; most recently thre years ago SIB: denies Hx of violence towards others: yes, since 58 years old- used to fight Current access to guns: Denies   Family Psychiatric History: sisters-unsure but states theres mental health problems   Social History:   Living: Armed forces operational officer, 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 School: 10th grade Job: no, on disability since the 90's for mental health Married/Children: married since 2008, one child Support: husband, daughter (lives 7 minutes away) Legal History: yes, assault with a deadly weapon - was in prison for 24 months, three times   Smoking: yes, 1-2 cigarettes weekly since 58 years old Alcohol: on special occasions like birthdays and holidays- 2 margaritas at a time, never goes over 3 Illicit drugs: clean since 2007, relapsed, 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, daily  Past Medical History:  Past Medical History:  Diagnosis Date   Arthritis    Chronic back pain    Chronic headaches    Chronic kidney disease    Depression    HIV infection (HCC)    Joint pain    Localized swelling of both lower legs    Legs, feet and hands   Pre-diabetes    Pulmonary embolism (HCC)    on Xarelto    Schizoaffective disorder (HCC)    Seasonal allergies    Stroke (cerebrum) (HCC)    patient denies, states it was a heat stroke   Weight loss     Past Surgical History:   Procedure Laterality Date   ABDOMINAL EXPOSURE N/A 07/27/2018   Procedure: ABDOMINAL EXPOSURE;  Surgeon: Margherita Shell, MD;  Location: [facility] OR;  Service: Vascular;  Laterality: N/A;   ABDOMINAL HYSTERECTOMY     ANTERIOR LUMBAR FUSION N/A 07/27/2018   Procedure: ANTERIOR LUMBAR FUSION L5-S1;  Surgeon: Mort Ards, MD;  Location: [facility] OR;  Service: Orthopedics;  Laterality: N/A;  3 hrs/ Dr. Charlotte Cookey to do approach HIV Positive   BREAST EXCISIONAL BIOPSY Right 1990's   BREAST SURGERY     TRANSFORAMINAL LUMBAR INTERBODY FUSION (TLIF) WITH PEDICLE SCREW FIXATION 1 LEVEL N/A 10/02/2021   Procedure: TRANSFORAMINAL LUMBAR INTERBODY FUSION LUMBAR FOUR THROUGH FIVE;  Surgeon: Mort Ards, MD;  Location: [facility] OR;  Service: Orthopedics;  Laterality: N/A;   Family History:  Family History  Problem Relation Age of Onset   Diabetes Mother    Hypertension Mother    Tremor Mother    High blood pressure Sister    Breast cancer Maternal Aunt    Colon  cancer Maternal Aunt    High blood pressure Maternal Uncle    Diabetes Maternal Uncle    Rectal cancer Neg Hx    Esophageal cancer Neg Hx    Stomach cancer Neg Hx      Social History   Socioeconomic History   Marital status: Married    Spouse name: Not on file   Number of children: 1   Years of education: Not on file   Highest education level: Not on file  Occupational History   Occupation: NA  Tobacco Use   Smoking status: Former    Current packs/day: 0.00    Types: Cigarettes    Quit date: 04/26/2016    Years since quitting: 7.5   Smokeless tobacco: Never   Tobacco comments:    1 pack of cigarettes will last a month  Vaping Use   Vaping status: Former  Substance and Sexual Activity   Alcohol use: No    Alcohol/week: 0.0 standard drinks of alcohol    Comment: quit 12/2005   Drug use: Not Currently    Types: "Crack" cocaine    Comment: Previous drug use, quit 2004-05; relapsed 05/2018, clean since 06/08/2018   Sexual activity: Yes     Partners: Male    Birth control/protection: None    Comment: declined condoms   Other Topics Concern   Not on file  Social History Narrative   Lives at home w/ her husband   Caffeine: 1/2 c coffee per day   Social Drivers of Corporate investment banker Strain: Not on file  Food Insecurity: No Food Insecurity (12/29/2022)   Hunger Vital Sign    Worried About Running Out of Food in the Last Year: Never true    Ran Out of Food in the Last Year: Never true  Transportation Needs: No Transportation Needs (12/29/2022)   PRAPARE - Administrator, Civil Service (Medical): No    Lack of Transportation (Non-Medical): No  Physical Activity: Not on file  Stress: Not on file  Social Connections: Not on file    Allergies:  Allergies  Allergen Reactions   Tetracycline Rash   Glyceryl Stear-Peg 100 Stear Other (See Comments)   Pork-Derived Products Other (See Comments)    Religious practice   Cyclobenzaprine Rash and Itching   Moxifloxacin Rash   Tetracyclines & Related Itching and Rash   Tramadol Itching and Rash    Current Medications: Current Outpatient Medications  Medication Sig Dispense Refill   dolutegravir -rilpivirine  (JULUCA ) 50-25 MG tablet Take 1 tablet by mouth daily before lunch. 30 tablet 3   lamoTRIgine  (LAMICTAL ) 100 MG tablet Take 1 tablet (100 mg total) by mouth daily. 30 tablet 1   olmesartan (BENICAR) 20 MG tablet Take 20 mg by mouth daily.     QUEtiapine  (SEROQUEL  XR) 300 MG 24 hr tablet Take 2 tablets (600 mg total) by mouth at bedtime. 60 tablet 1   rivaroxaban  (XARELTO ) 20 MG TABS tablet Take 20 mg by mouth at bedtime.     No current facility-administered medications for this visit.     Objective:  Psychiatric Specialty Exam: Blood pressure (!) 151/103, pulse 81, temperature 98.7 F (37.1 C).There is no height or weight on file to calculate BMI.   General Appearance: appears at stated age, casually dressed and groomed   Behavior: pleasant  and cooperative   Psychomotor Activity: no psychomotor agitation or retardation noted   Eye Contact: fair  Speech: normal amount, tone, volume and fluency  Mood: euthymic  Affect: congruent, pleasant and interactive   Thought Process: linear, goal directed, no circumstantial or tangential thought process noted, no racing thoughts or flight of ideas  Descriptions of Associations: intact   Thought Content Hallucinations: denies AH, VH , does not appear responding to stimuli  Delusions: no paranoia, delusions of control, grandeur, ideas of reference, thought broadcasting, and magical thinking  Suicidal Thoughts: denies SI, intention, plan  Homicidal Thoughts: denies HI, intention, plan   Alertness/Orientation: alert and fully oriented   Insight: fair Judgment: fair  Memory: intact   Executive Functions  Concentration: intact  Attention Span: fair  Recall: intact  Fund of Knowledge: fair   PE: General: well-appearing; no acute distress  Pulm: no increased work of breathing on room air  Strength & Muscle Tone: within normal limits Neuro: no focal neurological deficits observed  Gait & Station: normal  ROS: No reported symptoms  Metabolic Disorder Labs: Lab Results  Component Value Date   HGBA1C 5.2 06/21/2018   MPG 102.54 06/21/2018   MPG 108 12/30/2016   Lab Results  Component Value Date   PROLACTIN 127.7 (H) 06/22/2018   Lab Results  Component Value Date   CHOL 209 (H) 05/22/2022   TRIG 92 05/22/2022   HDL 72 05/22/2022   CHOLHDL 2.9 05/22/2022   VLDL 27 06/21/2018   LDLCALC 117 (H) 05/22/2022   LDLCALC 85 06/21/2018   Lab Results  Component Value Date   TSH 0.656 12/19/2021   TSH 3.904 06/21/2018    Therapeutic Level Labs: No results found for: "LITHIUM" No results found for: "VALPROATE" No results found for: "CBMZ"  Screenings:  AUDIT    Flowsheet Row [facility]   Alcohol Use Disorder Identification Test Final Score (AUDIT) 0      CAGE-AID    Flowsheet Row ED to Hosp-Admission (Discharged) from 12/28/2022 in [facility]  CAGE-AID Score 0      PHQ2-9    Flowsheet Row Clinical Support from 11/03/2023 in [facility] Office Visit from 10/19/2023 in [facility] - A Dept Of [place] Office Visit from 09/08/2023 in [facility] Office Visit from 07/27/2023 in [facility] - A Dept Of [place]. [facility] Office Visit from 04/08/2023 in [facility] - A Dept Of [place]. [facility]  PHQ-2 Total Score 3 0 4 0 1  PHQ-9 Total Score 13 -- 18 -- --      Flowsheet Row ED to Hosp-Admission (Discharged) from 12/28/2022 in [place] [facility] ED from 12/09/2022 in [facility] Department at [facility] ED from 02/25/2022 in [facility] Department at [facility]  C-SSRS RISK CATEGORY No Risk No Risk No Risk        Joice Nares, MD 11/03/2023, 1:48 PM

## 2023-11-03 ENCOUNTER — Ambulatory Visit (INDEPENDENT_AMBULATORY_CARE_PROVIDER_SITE_OTHER): Payer: MEDICAID | Admitting: Psychiatry

## 2023-11-03 VITALS — BP 151/103 | HR 81 | Temp 98.7°F

## 2023-11-03 DIAGNOSIS — F250 Schizoaffective disorder, bipolar type: Secondary | ICD-10-CM | POA: Diagnosis not present

## 2023-11-03 DIAGNOSIS — F50811 Binge eating disorder, moderate: Secondary | ICD-10-CM

## 2023-11-03 DIAGNOSIS — Z79899 Other long term (current) drug therapy: Secondary | ICD-10-CM

## 2023-11-03 DIAGNOSIS — F411 Generalized anxiety disorder: Secondary | ICD-10-CM

## 2023-11-03 DIAGNOSIS — F4000 Agoraphobia, unspecified: Secondary | ICD-10-CM

## 2023-11-03 DIAGNOSIS — F4310 Post-traumatic stress disorder, unspecified: Secondary | ICD-10-CM | POA: Diagnosis not present

## 2023-11-03 MED ORDER — QUETIAPINE FUMARATE ER 300 MG PO TB24: 600.0000 mg | ORAL_TABLET | Freq: Every day | ORAL | 1 refills | Status: DC

## 2023-11-03 MED ORDER — LAMOTRIGINE 100 MG PO TABS: 100.0000 mg | ORAL_TABLET | Freq: Every day | ORAL | 1 refills | Status: DC

## 2023-11-03 MED ORDER — HYDROXYZINE HCL 25 MG PO TABS: 25.0000 mg | ORAL_TABLET | Freq: Two times a day (BID) | ORAL | 1 refills | Status: DC | PRN

## 2023-11-03 MED ORDER — SERTRALINE HCL 50 MG PO TABS: 50.0000 mg | ORAL_TABLET | Freq: Every day | ORAL | 1 refills | Status: DC

## 2023-11-24 ENCOUNTER — Ambulatory Visit (HOSPITAL_COMMUNITY): Payer: MEDICAID | Admitting: Mental Health

## 2023-12-01 ENCOUNTER — Other Ambulatory Visit: Payer: Self-pay

## 2023-12-01 DIAGNOSIS — B20 Human immunodeficiency virus [HIV] disease: Secondary | ICD-10-CM

## 2023-12-01 MED ORDER — JULUCA 50-25 MG PO TABS: 1.0000 | ORAL_TABLET | Freq: Every day | ORAL | 5 refills | Status: DC

## 2023-12-09 ENCOUNTER — Telehealth (HOSPITAL_COMMUNITY): Payer: Self-pay | Admitting: *Deleted

## 2023-12-09 ENCOUNTER — Ambulatory Visit (INDEPENDENT_AMBULATORY_CARE_PROVIDER_SITE_OTHER): Payer: MEDICAID | Admitting: Physician Assistant

## 2023-12-09 ENCOUNTER — Ambulatory Visit: Payer: MEDICAID

## 2023-12-09 ENCOUNTER — Encounter: Payer: Self-pay | Admitting: Physician Assistant

## 2023-12-09 VITALS — BP 128/81 | HR 100 | Resp 20 | Ht 68.0 in | Wt 211.0 lb

## 2023-12-09 DIAGNOSIS — R413 Other amnesia: Secondary | ICD-10-CM

## 2023-12-09 NOTE — Patient Instructions (Addendum)
 It was a pleasure to see you today at our office.   Recommendations:  MRI of the brain, the radiology office will call you to arrange you appointment   Follow up in 4 months     [email or web address]   RECOMMENDATIONS FOR ALL PATIENTS WITH MEMORY PROBLEMS: 1. Continue to exercise (Recommend 30 minutes of walking everyday, or 3 hours every week) 2. Increase social interactions - continue going to [place] and enjoy social gatherings with friends and family 3. Eat healthy, avoid fried foods and eat more fruits and vegetables 4. Maintain adequate blood pressure, blood sugar, and blood cholesterol level. Reducing the risk of stroke and cardiovascular disease also helps promoting better memory. 5. Avoid stressful situations. Live a simple life and avoid aggravations. Organize your time and prepare for the next day in anticipation. 6. Sleep well, avoid any interruptions of sleep and avoid any distractions in the bedroom that may interfere with adequate sleep quality 7. Avoid sugar, avoid sweets as there is a strong link between excessive sugar intake, diabetes, and cognitive impairment We discussed the Mediterranean diet, which has been shown to help patients reduce the risk of progressive memory disorders and reduces cardiovascular risk. This includes eating fish, eat fruits and green leafy vegetables, nuts like almonds and hazelnuts, walnuts, and also use olive oil. Avoid fast foods and fried foods as much as possible. Avoid sweets and sugar as sugar use has been linked to worsening of memory function.  There is always a concern of gradual progression of memory problems. If this is the case, then we may need to adjust level of care according to patient needs. Support, both to the patient and caregiver, should then be put into place.       DRIVING: Regarding driving, in patients with progressive memory problems, driving will be impaired. We  advise to have someone else do the driving if trouble finding directions or if minor accidents are reported. Independent driving assessment is available to determine safety of driving.   If you are interested in the driving assessment, you can contact the following:  [employer] in [place] [phone]  [facility] [phone]  [facility] [phone]  [facility] [phone] or [phone]   FALL PRECAUTIONS: Be cautious when walking. Scan the area for obstacles that may increase the risk of trips and falls. When getting up in the mornings, sit up at the edge of the bed for a few minutes before getting out of bed. Consider elevating the bed at the head end to avoid drop of blood pressure when getting up. Walk always in a well-lit room (use night lights in the walls). Avoid area rugs or power cords from appliances in the middle of the walkways. Use a walker or a cane if necessary and consider physical therapy for balance exercise. Get your eyesight checked regularly.  FINANCIAL OVERSIGHT: Supervision, especially oversight when making financial decisions or transactions is also recommended.  HOME SAFETY: Consider the safety of the kitchen when operating appliances like stoves, microwave oven, and blender. Consider having supervision and share cooking responsibilities until no longer able to participate in those. Accidents with firearms and other hazards in the house should be identified and addressed as well.   ABILITY TO BE LEFT ALONE: If patient is unable to contact 911 operator, consider using LifeLine, or when the need is there, arrange for someone to stay with patients. Smoking is a fire hazard, consider supervision or cessation. Risk of wandering should be assessed by  caregiver and if detected at any point, supervision and safe proof recommendations should be instituted.  MEDICATION SUPERVISION: Inability to self-administer medication  needs to be constantly addressed. Implement a mechanism to ensure safe administration of the medications.      Mediterranean Diet A Mediterranean diet refers to food and lifestyle choices that are based on the traditions of countries located on the [employer] [place]. This way of eating has been shown to help prevent certain conditions and improve outcomes for people who have chronic diseases, like kidney disease and heart disease. What are tips for following this plan? Lifestyle  Cook and eat meals together with your family, when possible. Drink enough fluid to keep your urine clear or pale yellow. Be physically active every day. This includes: Aerobic exercise like running or swimming. Leisure activities like gardening, walking, or housework. Get 7-8 hours of sleep each night. If recommended by your health care provider, drink red wine in moderation. This means 1 glass a day for nonpregnant women and 2 glasses a day for men. A glass of wine equals 5 oz (150 mL). Reading food labels  Check the serving size of packaged foods. For foods such as rice and pasta, the serving size refers to the amount of cooked product, not dry. Check the total fat in packaged foods. Avoid foods that have saturated fat or trans fats. Check the ingredients list for added sugars, such as corn syrup. Shopping  At the grocery store, buy most of your food from the areas near the walls of the store. This includes: Fresh fruits and vegetables (produce). Grains, beans, nuts, and seeds. Some of these may be available in unpackaged forms or large amounts (in bulk). Fresh seafood. Poultry and eggs. Low-fat dairy products. Buy whole ingredients instead of prepackaged foods. Buy fresh fruits and vegetables in-season from local farmers markets. Buy frozen fruits and vegetables in resealable bags. If you do not have access to quality fresh seafood, buy precooked frozen shrimp or canned fish, such as tuna, salmon, or  sardines. Buy small amounts of raw or cooked vegetables, salads, or olives from the deli or salad bar at your store. Stock your pantry so you always have certain foods on hand, such as olive oil, canned tuna, canned tomatoes, rice, pasta, and beans. Cooking  Cook foods with extra-virgin olive oil instead of using butter or other vegetable oils. Have meat as a side dish, and have vegetables or grains as your main dish. This means having meat in small portions or adding small amounts of meat to foods like pasta or stew. Use beans or vegetables instead of meat in common dishes like chili or lasagna. Experiment with different cooking methods. Try roasting or broiling vegetables instead of steaming or sauteing them. Add frozen vegetables to soups, stews, pasta, or rice. Add nuts or seeds for added healthy fat at each meal. You can add these to yogurt, salads, or vegetable dishes. Marinate fish or vegetables using olive oil, lemon juice, garlic, and fresh herbs. Meal planning  Plan to eat 1 vegetarian meal one day each week. Try to work up to 2 vegetarian meals, if possible. Eat seafood 2 or more times a week. Have healthy snacks readily available, such as: Vegetable sticks with hummus. Greek yogurt. Fruit and nut trail mix. Eat balanced meals throughout the week. This includes: Fruit: 2-3 servings a day Vegetables: 4-5 servings a day Low-fat dairy: 2 servings a day Fish, poultry, or lean meat: 1 serving a day Beans and legumes: 2  or more servings a week Nuts and seeds: 1-2 servings a day Whole grains: 6-8 servings a day Extra-virgin olive oil: 3-4 servings a day Limit red meat and sweets to only a few servings a month What are my food choices? Mediterranean diet Recommended Grains: Whole-grain pasta. Brown rice. Bulgar wheat. Polenta. Couscous. Whole-wheat bread. Dwyane Glad. Vegetables: Artichokes. Beets. Broccoli. Cabbage. Carrots. Eggplant. Green beans. Chard. Kale. Spinach.  Onions. Leeks. Peas. Squash. Tomatoes. Peppers. Radishes. Fruits: Apples. Apricots. Avocado. Berries. Bananas. Cherries. Dates. Figs. Grapes. Lemons. Melon. Oranges. Peaches. Plums. Pomegranate. Meats and other protein foods: Beans. Almonds. Sunflower seeds. Pine nuts. Peanuts. Cod. Salmon. Scallops. Shrimp. Tuna. Tilapia. Clams. Oysters. Eggs. Dairy: Low-fat milk. Cheese. Greek yogurt. Beverages: Water. Red wine. Herbal tea. Fats and oils: Extra virgin olive oil. Avocado oil. Grape seed oil. Sweets and desserts: [place] yogurt with honey. Baked apples. Poached pears. Trail mix. Seasoning and other foods: Basil. Cilantro. Coriander. Cumin. Mint. Parsley. Sage. Rosemary. Tarragon. Garlic. Oregano. Thyme. Pepper. Balsalmic vinegar. Tahini. Hummus. Tomato sauce. Olives. Mushrooms. Limit these Grains: Prepackaged pasta or rice dishes. Prepackaged cereal with added sugar. Vegetables: Deep fried potatoes (french fries). Fruits: Fruit canned in syrup. Meats and other protein foods: Beef. Pork. Lamb. Poultry with skin. Hot dogs. Helene Loader. Dairy: Ice cream. Sour cream. Whole milk. Beverages: Juice. Sugar-sweetened soft drinks. Beer. Liquor and spirits. Fats and oils: Butter. Canola oil. Vegetable oil. Beef fat (tallow). Lard. Sweets and desserts: Cookies. Cakes. Pies. Candy. Seasoning and other foods: Mayonnaise. Premade sauces and marinades. The items listed may not be a complete list. Talk with your dietitian about what dietary choices are right for you. Summary The Mediterranean diet includes both food and lifestyle choices. Eat a variety of fresh fruits and vegetables, beans, nuts, seeds, and whole grains. Limit the amount of red meat and sweets that you eat. Talk with your health care provider about whether it is safe for you to drink red wine in moderation. This means 1 glass a day for nonpregnant women and 2 glasses a day for men. A glass of wine equals 5 oz (150 mL). This information is not  intended to replace advice given to you by your health care provider. Make sure you discuss any questions you have with your health care provider. Document Released: 02/20/2016 Document Revised: 03/24/2016 Document Reviewed: 02/20/2016 Elsevier Interactive Patient Education  2017 [employer].

## 2023-12-09 NOTE — Progress Notes (Signed)
 Assessment/Plan:     Tracy [name] is a very pleasant 58 y.o. 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 MoCA today is 24/30 .  Workup is in progress. Etiology is unclear, likely multifactorial. Patient is able to participate on ADLs and to drive without difficulties. Mood is controlled with behavioral therapy   Memory Impairment of unclear etiology, likely multifactorial     Follow up in 4 months  MRI brain without contrast to assess for underlying structural abnormality and assess vascular load  Start B12 supplements  Recommend good control of cardiovascular risk factors.   Continue to control mood as per [facility].  She is on Lamictal  and Seroquel , Atarax  prn. For headaches limit use of pain relievers to no more than 2 days out of week to prevent risk of rebound or medication-overuse headache. Exercise, hydration, caffeine cessation, sleep hygiene, monitor for and avoid triggers Consider:  magnesium  citrate 400mg  daily, riboflavin 400mg  daily, and coenzyme Q10 100mg  three times daily  Subjective:    The patient is here alone    How long did patient have memory difficulties?  For about 6 months. Patient reports some difficulty remembering new information, recent conversations, names.  Short-term memory is worse than long-term memory.  She has to write notes or ask her mother to remind her of appointments etc.  She has known flight of ideas in view of psychoaffective disorder. repeats oneself?  Endorsed, for the last 6 months Disoriented when walking into a room? Denies    Leaving objects in unusual places?  Denies.   Wandering behavior? Denies.   Any personality changes, or depression, anxiety?  Has a  history of psychoaffective disorder, MDD, paranoia PTSD, binge eating disorder on Seroquel  600 mg.  She sees behavioral health monthly.  Hallucinations?  She hears voices but this is attributed to her psychiatric disorder.  She also sees visual hallucinations, may see someone in the backyard.  Seizures? Denies.    Any sleep changes?  Sometimes she does not sleep well, especially when flashbacks.  Denies REM behavior or sleepwalking   Sleep apnea? Denies.   Any hygiene concerns?  Denies.   Independent of bathing and dressing? Endorsed  Does the patient need help with medications?  Husband is in charge   Who is in charge of the finances? He is in charge as she was forgetting to pay bills early this year.     Any changes in appetite?   .Favors carbs and snacks. She has binging behavior.  She drinks plenty water.    Patient have trouble swallowing?  Denies.   Does the patient cook? No.   Any headaches?  She has a history of chronic headaches, controlled with sumatriptan as needed. She however takes tylenol  twice a day   Chronic pain? Chronic back pain, after 2 back surgeries. uses topical agents and tylenol    Ambulates with difficulty? Denies. Walks frequently Recent falls or head injuries? Yesterday she fell when she got dizzy, "maybe the sugars".  No LOC, No head injury. She was recently started on Zoloft .  Vision changes?  Denies any new issues.  Any strokelike symptoms?  She had stroke like symptoms last June 2024 but it was felt to be functional by IP neurology. "it was a heat stroke". Workup  was negative. MRI brain at the time yielded normal results, mild chronic vascular changes.  Any tremors? Denies.   Any anosmia? Denies.   Any incontinence of urine? Denies.   Any bowel dysfunction?Constipation    Patient lives with husband, her sister and her mother.   History of heavy alcohol intake? Denies.   History of heavy tobacco use? Quit cigarettes 2 months ago  Prior history of substance  abuse, quitting 13 years ago. Family history of dementia?  Maternal Aunt with dementia  Does patient drive?  Yes, has gotten lost in Jan and April .Could not figure out how to get out of the neighborhood.    Allergies  Allergen Reactions   Tetracycline Rash   Glyceryl Stear-Peg 100 Stear Other (See Comments)   Pork-Derived Products Other (See Comments)    Religious practice   Cyclobenzaprine Rash and Itching   Moxifloxacin Rash   Tetracyclines & Related Itching and Rash   Tramadol Itching and Rash    Current Outpatient Medications  Medication Instructions   dolutegravir -rilpivirine  (JULUCA ) 50-25 MG tablet 1 tablet, Oral, Daily before lunch   hydrOXYzine  (ATARAX ) 25 mg, Oral, 2 times daily PRN   lamoTRIgine  (LAMICTAL ) 100 mg, Oral, Daily   olmesartan (BENICAR) 20 mg, Daily   QUEtiapine  (SEROQUEL  XR) 600 mg, Oral, Daily at bedtime   rivaroxaban  (XARELTO ) 20 mg, Daily at bedtime   sertraline  (ZOLOFT ) 50 mg, Oral, Daily     VITALS:   Vitals:   12/09/23 0950  BP: 128/81  Pulse: 100  Resp: 20  SpO2: 98%  Weight: 211 lb (95.7 kg)  Height: 5\' 8"  (1.727 m)      PHYSICAL EXAM   HEENT:  Normocephalic, atraumatic.  The superficial temporal arteries are without ropiness or tenderness. Cardiovascular: Regular rate and rhythm. Lungs: Clear to auscultation bilaterally. Neck: There are no carotid bruits noted bilaterally.  NEUROLOGICAL:    12/09/2023   12:00 PM  Montreal Cognitive Assessment   Visuospatial/ Executive (0/5) 4  Naming (0/3) 2  Attention: Read list of digits (0/2) 2  Attention: Read list of letters (0/1) 1  Attention: Serial 7 subtraction starting at 100 (0/3) 2  Language: Repeat phrase (0/2) 0  Language : Fluency (0/1) 1  Abstraction (0/2) 2  Delayed Recall (0/5) 5  Orientation (0/6) 4  Total 23  Adjusted Score (based on education) 24        No data to display           Orientation:  Alert and oriented to person, place and not to time. No aphasia  or dysarthria. Fund of knowledge is appropriate. Recent and remote memory impaired.  Attention and concentration are reduced . Able to name objects and unable to repeat phrases.  Delayed recall  5/5 .  Cranial nerves: There is good facial symmetry. Extraocular muscles are intact and visual fields are full to confrontational testing. Speech is fluent and clear. No tongue deviation. Hearing is intact to conversational tone. Tone: Tone is good throughout. Sensation: Sensation is intact to light touch.  Vibration is intact at the bilateral big toe.  Coordination: The patient has no difficulty with RAM's or FNF bilaterally. Normal finger to nose  Motor: Strength is 5/5 in the bilateral upper and lower extremities. There is no pronator drift. There are no fasciculations noted. DTR's: Deep tendon reflexes are 2/4 bilaterally. Gait and Station: The patient is able to ambulate without difficulty. Gait is cautious and narrow. Stride length is normal.  Thank you for allowing us  the opportunity to participate in the care of this nice patient. Please do not hesitate to contact us  for any questions or concerns.   Total time spent on today's visit was 60 minutes dedicated to this patient today, preparing to see patient, examining the patient, ordering tests and/or medications and counseling the patient, documenting clinical information in the EHR or other health record, independently interpreting results and communicating results to the patient/family, discussing treatment and goals, answering patient's questions and coordinating care.  Cc:  Abelino Hof, FNP  Tex Filbert 12/09/2023 12:15 PM

## 2023-12-09 NOTE — Telephone Encounter (Signed)
 My Pharmacy sent request for rx for hydroxyzine ,Lamotrigine  and Quetiapine  ER. She has enough medicine till 6/23 but in looking at her RX request I dont see a future appt with a provider for meds to be continued. I will contact front desk here to reach out for a future appt. With a provider. She has meds for now but as [facility] is mail order they ask for rxs in advance of need.

## 2023-12-09 NOTE — Telephone Encounter (Signed)
 See note also dated for today

## 2023-12-10 ENCOUNTER — Other Ambulatory Visit: Payer: Self-pay | Admitting: Psychiatry

## 2023-12-10 DIAGNOSIS — F250 Schizoaffective disorder, bipolar type: Secondary | ICD-10-CM

## 2023-12-10 DIAGNOSIS — F411 Generalized anxiety disorder: Secondary | ICD-10-CM

## 2023-12-10 MED ORDER — QUETIAPINE FUMARATE ER 300 MG PO TB24
600.0000 mg | ORAL_TABLET | Freq: Every day | ORAL | 1 refills | Status: DC
Start: 2023-12-10 — End: 2024-01-20

## 2023-12-10 MED ORDER — LAMOTRIGINE 100 MG PO TABS
100.0000 mg | ORAL_TABLET | Freq: Every day | ORAL | 1 refills | Status: DC
Start: 2023-12-10 — End: 2024-01-20

## 2023-12-10 NOTE — Addendum Note (Signed)
 Addended by: Saratha Cunas B on: 12/10/2023 08:17 AM   Modules accepted: Orders

## 2023-12-17 ENCOUNTER — Encounter: Payer: Self-pay | Admitting: Physician Assistant

## 2023-12-24 ENCOUNTER — Other Ambulatory Visit: Payer: MEDICAID

## 2023-12-27 ENCOUNTER — Telehealth: Payer: Self-pay

## 2023-12-27 NOTE — Telephone Encounter (Signed)
 Patient cancelled MRI brain wo contrast. At [facility] .FYI

## 2024-01-04 ENCOUNTER — Telehealth (HOSPITAL_COMMUNITY): Payer: Self-pay | Admitting: *Deleted

## 2024-01-04 NOTE — Telephone Encounter (Signed)
 My pharmacy faxed request for patients hydroxyzine  to be refilled. She has a future appt with provider in July and should have meds till then. Noted on med list that she requested hydroxyzine  be removed from her list as she is not taking it. No further action taken.

## 2024-01-05 ENCOUNTER — Encounter (HOSPITAL_COMMUNITY): Payer: Self-pay

## 2024-01-05 ENCOUNTER — Emergency Department (HOSPITAL_COMMUNITY)
Admission: EM | Admit: 2024-01-05 | Discharge: 2024-01-05 | Discharge status: Against Medical Advice | Payer: MEDICAID | Attending: Physician Assistant | Admitting: Physician Assistant

## 2024-01-05 ENCOUNTER — Other Ambulatory Visit: Payer: Self-pay

## 2024-01-05 DIAGNOSIS — R202 Paresthesia of skin: Secondary | ICD-10-CM | POA: Diagnosis not present

## 2024-01-05 DIAGNOSIS — R519 Headache, unspecified: Secondary | ICD-10-CM | POA: Insufficient documentation

## 2024-01-05 DIAGNOSIS — Z5321 Procedure and treatment not carried out due to patient leaving prior to being seen by health care provider: Secondary | ICD-10-CM | POA: Insufficient documentation

## 2024-01-05 DIAGNOSIS — Z7901 Long term (current) use of anticoagulants: Secondary | ICD-10-CM | POA: Diagnosis not present

## 2024-01-05 DIAGNOSIS — R109 Unspecified abdominal pain: Secondary | ICD-10-CM | POA: Diagnosis present

## 2024-01-05 DIAGNOSIS — R200 Anesthesia of skin: Secondary | ICD-10-CM | POA: Insufficient documentation

## 2024-01-05 LAB — COMPREHENSIVE METABOLIC PANEL WITH GFR
ALT: 14 U/L (ref 0–44)
AST: 17 U/L (ref 15–41)
Albumin: 4.4 g/dL (ref 3.5–5.0)
Alkaline Phosphatase: 61 U/L (ref 38–126)
Anion gap: 11 (ref 5–15)
BUN: 12 mg/dL (ref 6–20)
CO2: 21 mmol/L — ABNORMAL LOW (ref 22–32)
Calcium: 9.6 mg/dL (ref 8.9–10.3)
Chloride: 109 mmol/L (ref 98–111)
Creatinine, Ser: 1.07 mg/dL — ABNORMAL HIGH (ref 0.44–1.00)
GFR, Estimated: >60 mL/min (ref >60)
Glucose, Bld: 95 mg/dL (ref 70–99)
Potassium: 3.8 mmol/L (ref 3.5–5.1)
Sodium: 141 mmol/L (ref 135–145)
Total Bilirubin: 0.8 mg/dL (ref 0.0–1.2)
Total Protein: 7.2 g/dL (ref 6.5–8.1)

## 2024-01-05 LAB — CBC WITH DIFFERENTIAL/PLATELET
Abs Immature Granulocytes: 0.01 10*3/uL (ref 0.00–0.07)
Basophils Absolute: 0 10*3/uL (ref 0.0–0.1)
Basophils Relative: 1 %
Eosinophils Absolute: 0.1 10*3/uL (ref 0.0–0.5)
Eosinophils Relative: 1 %
HCT: 43.5 % (ref 36.0–46.0)
Hemoglobin: 14.3 g/dL (ref 12.0–15.0)
Immature Granulocytes: 0 %
Lymphocytes Relative: 40 %
Lymphs Abs: 2.2 10*3/uL (ref 0.7–4.0)
MCH: 30.4 pg (ref 26.0–34.0)
MCHC: 32.9 g/dL (ref 30.0–36.0)
MCV: 92.4 fL (ref 80.0–100.0)
Monocytes Absolute: 0.5 10*3/uL (ref 0.1–1.0)
Monocytes Relative: 9 %
Neutro Abs: 2.8 10*3/uL (ref 1.7–7.7)
Neutrophils Relative %: 49 %
Platelets: 265 10*3/uL (ref 150–400)
RBC: 4.71 MIL/uL (ref 3.87–5.11)
RDW: 13 % (ref 11.5–15.5)
WBC: 5.6 10*3/uL (ref 4.0–10.5)
nRBC: 0 % (ref 0.0–0.2)

## 2024-01-05 LAB — PROTIME-INR
INR: 1.2 (ref 0.8–1.2)
Prothrombin Time: 15.6 s — ABNORMAL HIGH (ref 11.4–15.2)

## 2024-01-05 LAB — BRAIN NATRIURETIC PEPTIDE: B Natriuretic Peptide: 21.7 pg/mL (ref 0.0–100.0)

## 2024-01-05 NOTE — ED Notes (Signed)
 Patient was rude cursing stating we are taking people back before her and doesn't understand how they are more important than her  Tried to explain the process of acuity and she told me that I was bull shitting her. Patient continued to cuss yell and told us  to take them out of the system

## 2024-01-05 NOTE — ED Notes (Signed)
 Pt yelling at this rn and and other staff bc she has been waiting to long. Explained to pt that it is by acuity and not who arrives first. Cn out to speak with pt. Pt not happy and asked to be removed from system. Pt escorted out by [employer]

## 2024-01-05 NOTE — ED Provider Triage Note (Cosign Needed Addendum)
 Emergency Medicine Provider Triage Evaluation Note  Tracy [name] , a 58 y.o. female  was evaluated in triage.  Pt complains of left hand tingling, left sided pain that has been ongoing for a week.  This resolved and then returned.  Has not taken anything for pain.  Also endorsing a headache and feeling somewhat dizzy. Prior hx of PE on xarelto .   Review of Systems  Positive: Left flank pain, left arm tingling Negative: Fever,   Physical Exam  There were no vitals taken for this visit. Gen:   Awake, no distress   Resp:  Normal effort  MSK:   Moves extremities without difficulty  Other:  Moves upper and lower extremities  Medical Decision Making  Medically screening exam initiated at 12:23 PM.  Appropriate orders placed.  Tracy [name] was informed that the remainder of the evaluation will be completed by another provider, this initial triage assessment does not replace that evaluation, and the importance of remaining in the ED until their evaluation is complete.  Neuro exam is benign. Ambulatory with a steady gait    Tracy Obrien, PA-C 01/05/24 1227    Tracy Dansby, PA-C 01/05/24 1227

## 2024-01-05 NOTE — ED Triage Notes (Signed)
 Pt having left hand tingling and  HA that has been going on for a week. Pt also has left side pain that comes and goes. Pt on blood thinner. No other neuro symptoms

## 2024-01-11 ENCOUNTER — Ambulatory Visit: Payer: MEDICAID | Admitting: Infectious Diseases

## 2024-01-19 NOTE — Progress Notes (Signed)
 [facility] MD Outpatient Progress Note  01/20/2024 8:39 AM Neveyah Garzon  MRN:  [id number]  Assessment:  Tya Haughey presents for follow-up evaluation in-person.  Patient was doing well until the incident where she went to the emergency department and had a confrontation with the police 2 weeks ago.  Ever since then she has been having difficulty sleeping, anxiety about going out, and flashbacks of the incident.  I provided supportive therapy and patient reassuringly has an upcoming therapy appointment to discuss coping skills to manage her thoughts and emotions about what occurred.  Patient reported no adverse effects with starting the Zoloft  and increasing it to 50 mg. I will increase her Zoloft  to 100 mg to aid with her symptoms and we will reassess in 1.5 months.  Patient's labs are up-to-date at this time but may need to get a new set of monitoring labs for her Seroquel .  Identifying Information: Amran Malter is a 57 y.o. female with a history of unspecified mood disorder (Bipolar vs schizoaffective) who is an established patient with [facility] for management of depression and anxiety  Plan:  #Unspecified mood disorder (Bipolar vs schizoaffective) Interventions:  -- Increase Zoloft  to 100 mg daily -- Continue Seroquel  600 mg nightly  A1c: 5.5 Lipid panel: chol 201  Labs updated on 08/2023 EKG: Qtc is 397 on 12/2023 -- Continue Lamictal  100 mg daily   #GAD #Agoraphobia # PTSD Interventions: -- Zoloft  as above - Continue hydroxyzine  25 mg twice daily as needed for anxiety   # Binge eating disorder Interventions: wellbutrin  -- Recommend nutritionist   Patient was given contact information for behavioral health clinic and was instructed to call 911 for emergencies.   Subjective:  Chief Complaint:  Chief Complaint  Patient presents with   Depression   Anxiety   Medication Refill    Interval History:   Patient seen alone.  Patient reports feeling  worried today. When she came today, she saw three police in the building which triggered her. Regarding psychiatric symptoms, she reports worsening anxiety. She reports going to the ED recently because she was noticing pain when she breathed heavily. She states leaving the ED before being seen because it was taking so long. She was upset because it took long to evaluation and the police followed her and she got worried and fought back and put her in handcuffs. She has court on 7/25. Regarding medications, patient notes benefit until the incident. She states the medication helps her stay calm. Patient reports the following adverse effects: denies. Patient reports poor sleep since the incident, difficulty staying asleep. Patient reports good appetite, reporting not eating as much starch. Patient denies current SI and HI. She denies auditory hallucinations currently. She states the last time she heard voices telling her to hurt herself was prior to taking medications.  Stressors include recently getting a citation by the police.    Visit Diagnosis:    ICD-10-CM   1. MDD (major depressive disorder), recurrent, severe, with psychosis (HCC)  F33.3 sertraline  (ZOLOFT ) 100 MG tablet    2. GAD (generalized anxiety disorder)  F41.1 hydrOXYzine  (ATARAX ) 25 MG tablet    QUEtiapine  (SEROQUEL  XR) 300 MG 24 hr tablet    sertraline  (ZOLOFT ) 100 MG tablet    3. Agoraphobia  F40.00 hydrOXYzine  (ATARAX ) 25 MG tablet    4. Schizoaffective disorder, bipolar type (HCC)  F25.0 lamoTRIgine  (LAMICTAL ) 100 MG tablet    QUEtiapine  (SEROQUEL  XR) 300 MG 24 hr tablet    5. PTSD (post-traumatic  stress disorder)  F43.10 sertraline  (ZOLOFT ) 100 MG tablet       Past Psychiatric History:  Diagnoses: Depression, schizophrenia  Medication trials: Prozac (did not like like it- reports having increased suicidal thoughts), Paxil, Trazodone (did not feel like it worked) Current: Seroquel , Wellbutrin , Lamictal  Previous  psychiatrist/therapist: Unsure when last time she saw one, thinks it was about one year ago; denies seeing a therapist but plan for a first appointment in March Hospitalizations: Yes, about 11 times, first time was 1990s due to suicide attempt and most recently 3 years ago due to SI Suicide attempts: 4-5 times- through overdosing; most recently thre years ago SIB: denies Hx of violence towards others: yes, since 58 years old- used to fight Current access to guns: Denies   Family Psychiatric History: sisters-unsure but states theres mental health problems   Social History:   Living: Armed forces operational officer, 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 School: 10th grade Job: no, on disability since the 15's for mental health Married/Children: married since 2008, one child Support: husband, daughter (lives 7 minutes away) Legal History: yes, assault with a deadly weapon - was in prison for 24 months, three times   Smoking: yes, 1-2 cigarettes daily since 58 years old Alcohol: on special occasions like birthdays and holidays- 2 margaritas at a time, never goes over 3 Illicit drugs: clean since 2007, relapsed, 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, daily 18 years ago  Past Medical History:  Past Medical History:  Diagnosis Date   Arthritis    Chronic back pain    Chronic headaches    Chronic kidney disease    Depression    HIV infection (HCC)    Joint pain    Localized swelling of both lower legs    Legs, feet and hands   Pre-diabetes    Pulmonary embolism (HCC)    on Xarelto    Schizoaffective disorder (HCC)    Seasonal allergies    Stroke (cerebrum) (HCC)    patient denies, states it was a heat stroke   Weight loss     Past Surgical History:  Procedure Laterality Date   ABDOMINAL EXPOSURE N/A 07/27/2018   Procedure: ABDOMINAL EXPOSURE;  Surgeon: Serene Gaile ORN, MD;  Location: [facility] OR;  Service: Vascular;  Laterality: N/A;   ABDOMINAL HYSTERECTOMY     ANTERIOR LUMBAR FUSION N/A 07/27/2018    Procedure: ANTERIOR LUMBAR FUSION L5-S1;  Surgeon: Burnetta Aures, MD;  Location: [facility] OR;  Service: Orthopedics;  Laterality: N/A;  3 hrs/ Dr. Serene to do approach HIV Positive   BREAST EXCISIONAL BIOPSY Right 1990's   BREAST SURGERY     TRANSFORAMINAL LUMBAR INTERBODY FUSION (TLIF) WITH PEDICLE SCREW FIXATION 1 LEVEL N/A 10/02/2021   Procedure: TRANSFORAMINAL LUMBAR INTERBODY FUSION LUMBAR FOUR THROUGH FIVE;  Surgeon: Burnetta Aures, MD;  Location: [facility] OR;  Service: Orthopedics;  Laterality: N/A;   Family History:  Family History  Problem Relation Age of Onset   Diabetes Mother    Hypertension Mother    Tremor Mother    High blood pressure Sister    Breast cancer Maternal Aunt    Colon cancer Maternal Aunt    High blood pressure Maternal Uncle    Diabetes Maternal Uncle    Rectal cancer Neg Hx    Esophageal cancer Neg Hx    Stomach cancer Neg Hx      Social History   Socioeconomic History   Marital status: Married    Spouse name: Not on file   Number  of children: 1   Years of education: 10   Highest education level: Not on file  Occupational History   Occupation: NA  Tobacco Use   Smoking status: Former    Current packs/day: 0.00    Types: Cigarettes    Quit date: 04/26/2016    Years since quitting: 7.7   Smokeless tobacco: Never   Tobacco comments:    1 pack of cigarettes will last a month  Vaping Use   Vaping status: Former  Substance and Sexual Activity   Alcohol use: No    Alcohol/week: 0.0 standard drinks of alcohol    Comment: quit 12/2005   Drug use: Not Currently    Types: Crack cocaine    Comment: Previous drug use, quit 2004-05; relapsed 05/2018, clean since 06/08/2018   Sexual activity: Yes    Partners: Male    Birth control/protection: None    Comment: declined condoms   Other Topics Concern   Not on file  Social History Narrative   Lives at home w/ her husband   Caffeine: 1/2 c coffee per day   Right handed   Lives with husband    One  floor home   Social Drivers of Health   Financial Resource Strain: Not on file  Food Insecurity: No Food Insecurity (12/29/2022)   Hunger Vital Sign    Worried About Running Out of Food in the Last Year: Never true    Ran Out of Food in the Last Year: Never true  Transportation Needs: No Transportation Needs (12/29/2022)   PRAPARE - Administrator, Civil Service (Medical): No    Lack of Transportation (Non-Medical): No  Physical Activity: Not on file  Stress: Not on file  Social Connections: Not on file    Allergies:  Allergies  Allergen Reactions   Tetracycline Rash   Glyceryl Stear-Peg 100 Stear Other (See Comments)   Pork-Derived Products Other (See Comments)    Religious practice   Cyclobenzaprine Rash and Itching   Moxifloxacin Rash   Tetracyclines & Related Itching and Rash   Tramadol Itching and Rash    Current Medications: Current Outpatient Medications  Medication Sig Dispense Refill   sertraline  (ZOLOFT ) 100 MG tablet Take 1 tablet (100 mg total) by mouth daily. 30 tablet 1   dolutegravir -rilpivirine  (JULUCA ) 50-25 MG tablet Take 1 tablet by mouth daily before lunch. 30 tablet 5   hydrOXYzine  (ATARAX ) 25 MG tablet Take 1 tablet (25 mg total) by mouth 2 (two) times daily as needed. 60 tablet 1   lamoTRIgine  (LAMICTAL ) 100 MG tablet Take 1 tablet (100 mg total) by mouth daily. 30 tablet 1   olmesartan (BENICAR) 20 MG tablet Take 20 mg by mouth daily.     QUEtiapine  (SEROQUEL  XR) 300 MG 24 hr tablet Take 2 tablets (600 mg total) by mouth at bedtime. 60 tablet 1   rivaroxaban  (XARELTO ) 20 MG TABS tablet Take 20 mg by mouth at bedtime.     No current facility-administered medications for this visit.     Objective:  Psychiatric Specialty Exam: General Appearance: appears at stated age, casually dressed and groomed   Behavior: pleasant and cooperative, tearful at times  Psychomotor Activity: no psychomotor agitation or retardation noted   Eye  Contact: fair  Speech: normal amount, volume and fluency    Mood: depressed Affect: congruent  Thought Process: linear, goal directed, no circumstantial or tangential thought process noted, no racing thoughts or flight of ideas  Descriptions of Associations: intact  Thought Content Hallucinations: denies AH, VH , does not appear responding to stimuli  Delusions: no paranoia, delusions of control, grandeur, ideas of reference, thought broadcasting, and magical thinking  Suicidal Thoughts: denies SI, intention, plan  Homicidal Thoughts: denies HI, intention, plan   Alertness/Orientation: alert and fully oriented   Insight: fair Judgment: fair  Memory: intact   Executive Functions  Concentration: intact  Attention Span: fair  Recall: intact  Fund of Knowledge: fair    Physical Exam  General: Pleasant, well-appearing. No acute distress. Pulmonary: Normal effort. No wheezing or rales. Skin: No obvious rash or lesions. Neuro: A&Ox3.No focal deficit.   Review of Systems  No reported symptoms   Metabolic Disorder Labs: Lab Results  Component Value Date   HGBA1C 5.2 06/21/2018   MPG 102.54 06/21/2018   MPG 108 12/30/2016   Lab Results  Component Value Date   PROLACTIN 127.7 (H) 06/22/2018   Lab Results  Component Value Date   CHOL 209 (H) 05/22/2022   TRIG 92 05/22/2022   HDL 72 05/22/2022   CHOLHDL 2.9 05/22/2022   VLDL 27 06/21/2018   LDLCALC 117 (H) 05/22/2022   LDLCALC 85 06/21/2018   Lab Results  Component Value Date   TSH 0.656 12/19/2021   TSH 3.904 06/21/2018    Therapeutic Level Labs: No results found for: LITHIUM No results found for: VALPROATE No results found for: CBMZ  Screenings:  AUDIT    Flowsheet Row [facility]  Alcohol Use Disorder Identification Test Final Score (AUDIT) 0   CAGE-AID    Flowsheet Row ED to Hosp-Admission (Discharged) from  12/28/2022 in [facility]  CAGE-AID Score 0   PHQ2-9    Flowsheet Row Clinical Support from 11/03/2023 in [facility] Office Visit from 10/19/2023 in [facility] - [facility] Of [place] Office Visit from 09/08/2023 in [facility] Office Visit from 07/27/2023 in [facility] Dept Of [place] Office Visit from 04/08/2023 in [facility] - A Dept Of [place]  PHQ-2 Total Score 3 0 4 0 1  PHQ-9 Total Score 13 -- 18 -- --   Flowsheet Row ED from 01/05/2024 in [facility] at [facility] ED to Hosp-Admission (Discharged) from 12/28/2022 in [facility] ED from 12/09/2022 in [facility] Emergency Department at [facility]  C-SSRS RISK CATEGORY No Risk No Risk No Risk    Ismael Franco, MD PGY-3 Psychiatry Resident

## 2024-01-20 ENCOUNTER — Ambulatory Visit (INDEPENDENT_AMBULATORY_CARE_PROVIDER_SITE_OTHER): Payer: MEDICAID | Admitting: Psychiatry

## 2024-01-20 VITALS — BP 143/96 | Wt 209.0 lb

## 2024-01-20 DIAGNOSIS — F4000 Agoraphobia, unspecified: Secondary | ICD-10-CM

## 2024-01-20 DIAGNOSIS — F333 Major depressive disorder, recurrent, severe with psychotic symptoms: Secondary | ICD-10-CM

## 2024-01-20 DIAGNOSIS — F4310 Post-traumatic stress disorder, unspecified: Secondary | ICD-10-CM | POA: Diagnosis not present

## 2024-01-20 DIAGNOSIS — F250 Schizoaffective disorder, bipolar type: Secondary | ICD-10-CM

## 2024-01-20 DIAGNOSIS — F411 Generalized anxiety disorder: Secondary | ICD-10-CM

## 2024-01-20 MED ORDER — SERTRALINE HCL 100 MG PO TABS: 100.0000 mg | ORAL_TABLET | Freq: Every day | ORAL | 1 refills | Status: DC

## 2024-01-20 MED ORDER — QUETIAPINE FUMARATE ER 300 MG PO TB24: 600.0000 mg | ORAL_TABLET | Freq: Every day | ORAL | 1 refills | Status: DC

## 2024-01-20 MED ORDER — HYDROXYZINE HCL 25 MG PO TABS: 25.0000 mg | ORAL_TABLET | Freq: Two times a day (BID) | ORAL | 1 refills | Status: DC | PRN

## 2024-01-20 MED ORDER — LAMOTRIGINE 100 MG PO TABS: 100.0000 mg | ORAL_TABLET | Freq: Every day | ORAL | 1 refills | Status: DC

## 2024-01-20 NOTE — Patient Instructions (Signed)
 Thank you for attending your appointment today.  -- TAKE ZOLOFT  100 MG DAILY -- Continue other medications as prescribed.  Please do not make any changes to medications without first discussing with your provider. If you are experiencing a psychiatric emergency, please call 911 or present to your nearest emergency department. Additional crisis, medication management, and therapy resources are included below.  [facility]  [address], [place], [place] [address] [phone] WALK-IN URGENT CARE 24/7 FOR ANYONE [address] [place], [place]  [phone] Fax: [phone] [email or web address] *Interpreters available *Accepts all insurance and uninsured for Urgent Care needs *Accepts Medicaid and uninsured for outpatient treatment (below)      ONLY FOR [facility]  Below:    Outpatient New Patient Assessment/Therapy Walk-ins:        Monday, Wednesday, and Thursday 8am until slots are full (first come, first served)                   New Patient Psychiatry/Medication Management        Monday-Friday 8am-11am (first come, first served)               For all walk-ins we ask that you arrive by 7:15am, because patients will be seen in the order of arrival.

## 2024-01-25 ENCOUNTER — Ambulatory Visit (HOSPITAL_COMMUNITY): Payer: MEDICAID | Admitting: Mental Health

## 2024-01-25 ENCOUNTER — Encounter (HOSPITAL_COMMUNITY): Payer: Self-pay

## 2024-01-27 ENCOUNTER — Ambulatory Visit (HOSPITAL_COMMUNITY): Payer: MEDICAID | Admitting: Mental Health

## 2024-01-27 DIAGNOSIS — F4310 Post-traumatic stress disorder, unspecified: Secondary | ICD-10-CM | POA: Diagnosis not present

## 2024-01-27 DIAGNOSIS — F250 Schizoaffective disorder, bipolar type: Secondary | ICD-10-CM
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
## 2024-02-24 ENCOUNTER — Other Ambulatory Visit (HOSPITAL_COMMUNITY): Payer: Self-pay | Admitting: Psychiatry

## 2024-02-24 ENCOUNTER — Telehealth (HOSPITAL_COMMUNITY): Payer: Self-pay | Admitting: *Deleted

## 2024-02-24 DIAGNOSIS — F333 Major depressive disorder, recurrent, severe with psychotic symptoms: Secondary | ICD-10-CM

## 2024-02-24 DIAGNOSIS — F250 Schizoaffective disorder, bipolar type: Secondary | ICD-10-CM

## 2024-02-24 DIAGNOSIS — F4310 Post-traumatic stress disorder, unspecified: Secondary | ICD-10-CM

## 2024-02-24 DIAGNOSIS — F4000 Agoraphobia, unspecified: Secondary | ICD-10-CM

## 2024-02-24 DIAGNOSIS — F411 Generalized anxiety disorder: Secondary | ICD-10-CM

## 2024-02-24 MED ORDER — HYDROXYZINE HCL 25 MG PO TABS: 25.0000 mg | ORAL_TABLET | Freq: Two times a day (BID) | ORAL | 1 refills | Status: DC | PRN

## 2024-02-24 MED ORDER — SERTRALINE HCL 100 MG PO TABS
100.0000 mg | ORAL_TABLET | Freq: Every day | ORAL | 0 refills | Status: DC
Start: 2024-02-24 — End: 2024-03-10

## 2024-02-24 NOTE — Telephone Encounter (Signed)
 Fax request from [facility] for new rx for her hydroxyzine  and sertraline . Will be out of his refill on 03/22/24 and has his next appt 8/29. [facility] is [facility] order so they request it early so they can pack and ship it before she is out. Will forward this request to her provider, Dr Izella.

## 2024-03-02 NOTE — Progress Notes (Signed)
 BH MD Outpatient Progress Note  03/10/2024 8:01 AM Tracy [name]  MRN:  [id number]  Assessment:  Tracy [name] presents for follow-up evaluation in-person. Increased her Zoloft  in the prior visit due to worsening depression after confrontation with the police. Today, patient is doing well, managing psychosocial stressors appropriately and medications are keeping her paranoia, depression, and anxiety stable. No medication changes today, will f/u in 2 months.  Of note, patient's BP was elevated this morning. Patient denies symptoms of chest pain, SOB, and blurry vision. Pt was instructed to discuss high BP reading with their PCP and if those symptoms arose and worsened with the elevated BP to go to the ED.   Plan:  #Unspecified mood disorder (Bipolar vs schizoaffective) Interventions:  -- Zoloft  100 mg daily -- Continue Seroquel  600 mg nightly  A1c: 5.5 Lipid panel: chol 201 TSH WNL CBC WNL CMP elevated Cr (has CKD) Labs updated on 08/2023 EKG: Qtc is 397 on 12/2023 AIMS exam on 03/10/2024 normal -- Continue Lamictal  100 mg daily   #GAD #Agoraphobia # PTSD Interventions: -- Zoloft  as above - Continue hydroxyzine  25 mg twice daily as needed for anxiety   # Binge eating disorder Interventions: wellbutrin  -- Recommend nutritionist   Patient was given contact information for behavioral health clinic and was instructed to call 911 for emergencies.   Identifying Information: Tracy [name] is a 58 y.o. female with a history of unspecified mood disorder (Bipolar vs schizoaffective) who is an established patient with [facility] for management of depression and anxiety  Subjective:  Chief Complaint:  Chief Complaint  Patient presents with   Medication Refill   Follow-up    Interval History:   Patient seen alone.  Since the previous visit, she states having some highs and lows and states she is managing fairly and attributes this to the medications.  Regarding medications, patient notes better rest and less irritable. Patient reports the following adverse effects: denies. She enjoys playing puzzles on the phone, color, and cleaning her home. She reports taking hydroxyzine  when she goes outside.   Patient reports good sleep, 7-8 hours nightly. Patient reports good appetite, states that she is trying to be healthier.   Patient denies current SI, HI, and AVH.   Stressors include court coming up on 11/22 because of the altercation in the ED. Reflecting back, she feels upset but reports coping well. She states her family will come and she states this will help regarding panic attacks.   Substance use: denies   Visit Diagnosis:    ICD-10-CM   1. GAD (generalized anxiety disorder)  F41.1     2. Schizoaffective disorder, unspecified type (HCC)  F25.9        Past Psychiatric History:  Diagnoses: Depression, schizophrenia  Medication trials: Prozac (did not like like it- reports having increased suicidal thoughts), Paxil, Trazodone (did not feel like it worked) Current: Seroquel , Wellbutrin , Lamictal  Previous psychiatrist/therapist: Unsure when last time she saw one, thinks it was about one year ago; denies seeing a therapist but plan for a first appointment in March Hospitalizations: Yes, about 11 times, first time was 1990s due to suicide attempt and most recently 3 years ago due to SI Suicide attempts: 4-5 times- through overdosing; most recently thre years ago SIB: denies Hx of violence towards others: yes, since 58 years old- used to fight Current access to guns: Denies   Substance: Smoking: yes, 1-2 cigarettes daily since 58 years old Alcohol: on special occasions like birthdays and holidays- 2  margaritas at a time, never goes over 3 Illicit drugs: clean since 2007, relapsed, 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, daily 18 years ago  Family Psychiatric History: sisters-unsure but states theres mental health problems   Social  History:   Living: Armed forces operational officer, 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 School: 10th grade Job: no, on disability since the 90's for mental health Married/Children: married since 2008, one child Support: husband, daughter (lives 7 minutes away) Legal History: yes, assault with a deadly weapon - was in prison for 24 months, three times  Past Medical History:  Past Medical History:  Diagnosis Date   Arthritis    Chronic back pain    Chronic headaches    Chronic kidney disease    Depression    HIV infection (HCC)    Joint pain    Localized swelling of both lower legs    Legs, feet and hands   Pre-diabetes    Pulmonary embolism (HCC)    on Xarelto    Schizoaffective disorder (HCC)    Seasonal allergies    Stroke (cerebrum) (HCC)    patient denies, states it was a heat stroke   Weight loss     Past Surgical History:  Procedure Laterality Date   ABDOMINAL EXPOSURE N/A 07/27/2018   Procedure: ABDOMINAL EXPOSURE;  Surgeon: Serene Gaile ORN, MD;  Location: [facility] OR;  Service: Vascular;  Laterality: N/A;   ABDOMINAL HYSTERECTOMY     ANTERIOR LUMBAR FUSION N/A 07/27/2018   Procedure: ANTERIOR LUMBAR FUSION L5-S1;  Surgeon: Burnetta Aures, MD;  Location: [facility] OR;  Service: Orthopedics;  Laterality: N/A;  3 hrs/ Dr. Serene to do approach HIV Positive   BREAST EXCISIONAL BIOPSY Right 1990's   BREAST SURGERY     TRANSFORAMINAL LUMBAR INTERBODY FUSION (TLIF) WITH PEDICLE SCREW FIXATION 1 LEVEL N/A 10/02/2021   Procedure: TRANSFORAMINAL LUMBAR INTERBODY FUSION LUMBAR FOUR THROUGH FIVE;  Surgeon: Burnetta Aures, MD;  Location: [facility] OR;  Service: Orthopedics;  Laterality: N/A;   Family History:  Family History  Problem Relation Age of Onset   Diabetes Mother    Hypertension Mother    Tremor Mother    High blood pressure Sister    Breast cancer Maternal Aunt    Colon cancer Maternal Aunt    High blood pressure Maternal Uncle    Diabetes Maternal Uncle    Rectal cancer Neg Hx    Esophageal  cancer Neg Hx    Stomach cancer Neg Hx      Social History   Socioeconomic History   Marital status: Married    Spouse name: Not on file   Number of children: 1   Years of education: 10   Highest education level: Not on file  Occupational History   Occupation: NA  Tobacco Use   Smoking status: Some Days    Current packs/day: 0.00    Types: Cigarettes    Last attempt to quit: 04/26/2016    Years since quitting: 7.8   Smokeless tobacco: Never   Tobacco comments:    2 packs per week   Vaping Use   Vaping status: Former  Substance and Sexual Activity   Alcohol use: No    Alcohol/week: 0.0 standard drinks of alcohol    Comment: quit 12/2005   Drug use: Not Currently    Types: Crack cocaine    Comment: Previous drug use, quit 2004-05; relapsed 05/2018, clean since 06/08/2018   Sexual activity: Yes    Partners: Male    Birth control/protection:  None  Other Topics Concern   Not on file  Social History Narrative   Lives at home w/ her husband   Caffeine: 1/2 c coffee per day   Right handed   Lives with husband    One floor home   Social Drivers of Health   Financial Resource Strain: Medium Risk (01/27/2024)   Overall Financial Resource Strain (CARDIA)    Difficulty of Paying Living Expenses: Somewhat hard  Food Insecurity: No Food Insecurity (12/29/2022)   Hunger Vital Sign    Worried About Running Out of Food in the Last Year: Never true    Ran Out of Food in the Last Year: Never true  Transportation Needs: No Transportation Needs (12/29/2022)   PRAPARE - Administrator, Civil Service (Medical): No    Lack of Transportation (Non-Medical): No  Physical Activity: Insufficiently Active (01/27/2024)   Exercise Vital Sign    Days of Exercise per Week: 7 days    Minutes of Exercise per Session: 20 min  Stress: Stress Concern Present (01/27/2024)   [employer] of Occupational Health - Occupational Stress Questionnaire    Feeling of Stress: Very much   Social Connections: Moderately Integrated (01/27/2024)   Social Connection and Isolation Panel    Frequency of Communication with Friends and Family: More than three times a week    Frequency of Social Gatherings with Friends and Family: Never    Attends Religious Services: 1 to 4 times per year    Active Member of [employer] or Organizations: No    Attends Banker Meetings: Never    Marital Status: Married    Allergies:  Allergies  Allergen Reactions   Tetracycline Rash   Glyceryl Stear-Peg 100 Stear Other (See Comments)   Pork-Derived Products Other (See Comments)    Religious practice   Cyclobenzaprine Rash and Itching   Moxifloxacin Rash   Tetracyclines & Related Itching and Rash   Tramadol Itching and Rash    Current Medications: Current Outpatient Medications  Medication Sig Dispense Refill   dolutegravir -rilpivirine  (JULUCA ) 50-25 MG tablet Take 1 tablet by mouth daily before lunch. 30 tablet 5   hydrOXYzine  (ATARAX ) 25 MG tablet Take 1 tablet (25 mg total) by mouth 2 (two) times daily as needed. 60 tablet 1   lamoTRIgine  (LAMICTAL ) 100 MG tablet Take 1 tablet (100 mg total) by mouth daily. 30 tablet 1   QUEtiapine  (SEROQUEL  XR) 300 MG 24 hr tablet Take 2 tablets (600 mg total) by mouth at bedtime. 60 tablet 1   rivaroxaban  (XARELTO ) 20 MG TABS tablet Take 20 mg by mouth at bedtime.     sertraline  (ZOLOFT ) 100 MG tablet Take 1 tablet (100 mg total) by mouth daily. 60 tablet 0   No current facility-administered medications for this visit.     Objective:  Psychiatric Specialty Exam: General Appearance: appears at stated age, casually dressed and groomed   Behavior: pleasant and cooperative   Psychomotor Activity: no psychomotor agitation or retardation noted   Eye Contact: fair  Speech: normal amount, volume and fluency    Mood: euthymic  Affect: congruent, pleasant and interactive   Thought Process: linear, goal directed, no circumstantial or  tangential thought process noted, no racing thoughts or flight of ideas  Descriptions of Associations: intact   Thought Content Hallucinations: denies AH, VH , does not appear responding to stimuli  Delusions: no paranoia, delusions of control, grandeur, ideas of reference, thought broadcasting, and magical thinking  Suicidal Thoughts: denies SI,  intention, plan  Homicidal Thoughts: denies HI, intention, plan   Alertness/Orientation: alert and fully oriented   Insight: fair Judgment: fair  Memory: intact   Executive Functions  Concentration: intact  Attention Span: fair  Recall: intact  Fund of Knowledge: fair   Physical Exam  General: Pleasant, well-appearing . No acute distress. Pulmonary: Normal effort. No wheezing or rales. Skin: No obvious rash or lesions. Neuro: A&Ox3.No focal deficit.  Review of Systems  No reported symptoms    Metabolic Disorder Labs: Lab Results  Component Value Date   HGBA1C 5.2 06/21/2018   MPG 102.54 06/21/2018   MPG 108 12/30/2016   Lab Results  Component Value Date   PROLACTIN 127.7 (H) 06/22/2018   Lab Results  Component Value Date   CHOL 209 (H) 05/22/2022   TRIG 92 05/22/2022   HDL 72 05/22/2022   CHOLHDL 2.9 05/22/2022   VLDL 27 06/21/2018   LDLCALC 117 (H) 05/22/2022   LDLCALC 85 06/21/2018   Lab Results  Component Value Date   TSH 0.656 12/19/2021   TSH 3.904 06/21/2018    Therapeutic Level Labs: No results found for: LITHIUM No results found for: VALPROATE No results found for: CBMZ  Screenings:  AUDIT    Flowsheet Row [facility]  Alcohol Use Disorder Identification Test Final Score (AUDIT) 0   CAGE-AID    Flowsheet Row ED to Hosp-Admission (Discharged) from 12/28/2022 in [place] [facility]  CAGE-AID Score 0   GAD-7    Flowsheet Row Office Visit from 03/09/2024 in [facility] [place]  [facility] Counselor from 01/27/2024 in [facility]  Total GAD-7 Score 19 19   PHQ2-9    Flowsheet Row Office Visit from 03/09/2024 in [facility] [place] Counselor from 01/27/2024 in [facility] Clinical Support from 11/03/2023 in [facility] Office Visit from 10/19/2023 in [facility] [place] Office Visit from 09/08/2023 in [facility]  PHQ-2 Total Score 2 6 3  0 4  PHQ-9 Total Score 8 23 13  -- 18   Flowsheet Row Counselor from 01/27/2024 in [facility] ED from 01/05/2024 in [facility] ED to Hosp-Admission (Discharged) from 12/28/2022 in [place] [facility]  C-SSRS RISK CATEGORY No Risk No Risk No Risk    Ismael Franco, MD PGY-3 Psychiatry Resident

## 2024-03-03 ENCOUNTER — Encounter (HOSPITAL_COMMUNITY): Payer: Self-pay

## 2024-03-03 ENCOUNTER — Ambulatory Visit (HOSPITAL_COMMUNITY): Payer: MEDICAID | Admitting: Mental Health

## 2024-03-09 ENCOUNTER — Other Ambulatory Visit: Payer: Self-pay

## 2024-03-09 ENCOUNTER — Ambulatory Visit (INDEPENDENT_AMBULATORY_CARE_PROVIDER_SITE_OTHER): Payer: MEDICAID | Admitting: Infectious Diseases

## 2024-03-09 ENCOUNTER — Encounter: Payer: Self-pay | Admitting: Infectious Diseases

## 2024-03-09 VITALS — BP 135/88 | HR 84 | Temp 97.8°F | Ht 67.0 in | Wt 200.0 lb

## 2024-03-09 DIAGNOSIS — Z1231 Encounter for screening mammogram for malignant neoplasm of breast: Secondary | ICD-10-CM | POA: Diagnosis not present

## 2024-03-09 DIAGNOSIS — F17210 Nicotine dependence, cigarettes, uncomplicated: Secondary | ICD-10-CM | POA: Diagnosis not present

## 2024-03-09 DIAGNOSIS — N183 Chronic kidney disease, stage 3 unspecified: Secondary | ICD-10-CM

## 2024-03-09 DIAGNOSIS — B20 Human immunodeficiency virus [HIV] disease: Secondary | ICD-10-CM

## 2024-03-09 DIAGNOSIS — Z7185 Encounter for immunization safety counseling: Secondary | ICD-10-CM | POA: Diagnosis not present

## 2024-03-09 DIAGNOSIS — N1831 Chronic kidney disease, stage 3a: Secondary | ICD-10-CM | POA: Diagnosis not present

## 2024-03-09 DIAGNOSIS — Z23 Encounter for immunization: Secondary | ICD-10-CM

## 2024-03-09 DIAGNOSIS — Z1159 Encounter for screening for other viral diseases: Secondary | ICD-10-CM

## 2024-03-09 MED ORDER — JULUCA 50-25 MG PO TABS: 1.0000 | ORAL_TABLET | Freq: Every day | ORAL | 5 refills | Status: AC

## 2024-03-09 NOTE — Progress Notes (Signed)
 Brief Narrative   Patient ID: Tracy [name], female    DOB: [birth year], 58 y.o.   MRN: [id number]   Tracy [name] is a 58 y.o. female diagnosed with HIV disease in 2000 with unknown risk factor. Initial CD4 count and viral load are unavailable. Did not start treatment until 2007. No history of opportunistic infection.  Genotype reviewed with no significant medication resistant mutations.  ARVs: Kaletra/Trivizir, Epzicom/Viread /Prezista Inocencio, and Complera /Tivicay .  Changed to Juluca  in 2024 for CKD3 preferred regimen.    Subjective:    Chief Complaint  Patient presents with   Follow-up    Discussed the use of AI scribe software for clinical note transcription with the patient, who gave verbal consent to proceed.  History of Present Illness   Tracy [name] is a 58 year old female with HIV who presents for routine follow-up care.  She is experiencing mood fluctuations due to adjustments in her psychiatric medications. Her current medications include Xarelto , Seroquel , lamotrigine , sertraline , and hydroxyzine  as needed. The psychiatric medication was changed because it interfered with her blood thinners.  She has been experiencing weight loss, currently weighing 200 pounds, which she attributes to being sick and possibly due to medication adjustments.  She is interested in checking her kidney function and viral load, as it has been a while since her last CD4 count. Her last CD4 count in April was 980, and her kidney function was checked in June, showing a creatinine level of 1.07.  She has a history of a hysterectomy due to prolonged bleeding and fibroids and experiences hot flashes. Her last vaginal pap smear was in July 2024, which was negative, and she no longer requires Pap smears due to the removal of her cervix.  She is up to date with her pneumonia and shingles vaccines and plans to receive her flu and COVID vaccines next month.  She has reduced her smoking to two  cigarettes a day and plans to quit completely by October.       Allergies  Allergen Reactions   Tetracycline Rash   Glyceryl Stear-Peg 100 Stear Other (See Comments)   Pork-Derived Products Other (See Comments)    Religious practice   Cyclobenzaprine Rash and Itching   Moxifloxacin Rash   Tetracyclines & Related Itching and Rash   Tramadol Itching and Rash      Outpatient Medications Prior to Visit  Medication Sig Dispense Refill   dolutegravir -rilpivirine  (JULUCA ) 50-25 MG tablet Take 1 tablet by mouth daily before lunch. 30 tablet 5   hydrOXYzine  (ATARAX ) 25 MG tablet Take 1 tablet (25 mg total) by mouth 2 (two) times daily as needed. 60 tablet 1   lamoTRIgine  (LAMICTAL ) 100 MG tablet Take 1 tablet (100 mg total) by mouth daily. 30 tablet 1   QUEtiapine  (SEROQUEL  XR) 300 MG 24 hr tablet Take 2 tablets (600 mg total) by mouth at bedtime. 60 tablet 1   rivaroxaban  (XARELTO ) 20 MG TABS tablet Take 20 mg by mouth at bedtime.     sertraline  (ZOLOFT ) 100 MG tablet Take 1 tablet (100 mg total) by mouth daily. 60 tablet 0   olmesartan (BENICAR) 20 MG tablet Take 20 mg by mouth daily.     No facility-administered medications prior to visit.     Past Medical History:  Diagnosis Date   Arthritis    Chronic back pain    Chronic headaches    Chronic kidney disease    Depression    HIV infection (HCC)    Joint pain  Localized swelling of both lower legs    Legs, feet and hands   Pre-diabetes    Pulmonary embolism (HCC)    on Xarelto    Schizoaffective disorder (HCC)    Seasonal allergies    Stroke (cerebrum) (HCC)    patient denies, states it was a heat stroke   Weight loss      Past Surgical History:  Procedure Laterality Date   ABDOMINAL EXPOSURE N/A 07/27/2018   Procedure: ABDOMINAL EXPOSURE;  Surgeon: Serene Gaile ORN, MD;  Location: [facility];  Service: Vascular;  Laterality: N/A;   ABDOMINAL HYSTERECTOMY     ANTERIOR LUMBAR FUSION N/A 07/27/2018   Procedure: ANTERIOR  LUMBAR FUSION L5-S1;  Surgeon: Burnetta Aures, MD;  Location: [facility] OR;  Service: Orthopedics;  Laterality: N/A;  3 hrs/ Dr. Serene to do approach HIV Positive   BREAST EXCISIONAL BIOPSY Right 1990's   BREAST SURGERY     TRANSFORAMINAL LUMBAR INTERBODY FUSION (TLIF) WITH PEDICLE SCREW FIXATION 1 LEVEL N/A 10/02/2021   Procedure: TRANSFORAMINAL LUMBAR INTERBODY FUSION LUMBAR FOUR THROUGH FIVE;  Surgeon: Burnetta Aures, MD;  Location: [facility];  Service: Orthopedics;  Laterality: N/A;      Review of Systems  Constitutional:  Negative for appetite change, chills, diaphoresis, fatigue, fever and unexpected weight change.  Eyes:        Negative for acute change in vision  Respiratory:  Negative for chest tightness, shortness of breath and wheezing.   Cardiovascular:  Negative for chest pain.  Gastrointestinal:  Negative for diarrhea, nausea and vomiting.  Genitourinary:  Negative for dysuria, pelvic pain and vaginal discharge.  Musculoskeletal:  Negative for neck pain and neck stiffness.  Skin:  Negative for rash.  Neurological:  Negative for seizures, syncope, weakness and headaches.  Hematological:  Negative for adenopathy. Does not bruise/bleed easily.  Psychiatric/Behavioral:  Negative for hallucinations.       Objective:    BP 135/88   Pulse 84   Temp 97.8 F (36.6 C) (Temporal)   Ht 5' 7 (1.702 m)   Wt 200 lb (90.7 kg)   SpO2 97%   BMI 31.32 kg/m  Nursing note and vital signs reviewed.  Physical Exam Constitutional:      General: She is not in acute distress.    Appearance: She is well-developed.  Eyes:     Conjunctiva/sclera: Conjunctivae normal.  Cardiovascular:     Rate and Rhythm: Normal rate and regular rhythm.     Heart sounds: Normal heart sounds. No murmur heard.    No friction rub. No gallop.  Pulmonary:     Effort: Pulmonary effort is normal. No respiratory distress.     Breath sounds: Normal breath sounds. No wheezing or rales.  Chest:     Chest wall: No  tenderness.  Abdominal:     General: Bowel sounds are normal.     Palpations: Abdomen is soft.     Tenderness: There is no abdominal tenderness.  Musculoskeletal:     Cervical back: Neck supple.  Lymphadenopathy:     Cervical: No cervical adenopathy.  Skin:    General: Skin is warm and dry.     Findings: No rash.  Neurological:     Mental Status: She is alert and oriented to person, place, and time.  Psychiatric:        Behavior: Behavior normal.        Thought Content: Thought content normal.        Judgment: Judgment normal.  01/27/2024    8:20 AM 11/03/2023    1:48 PM 10/19/2023   10:27 AM 09/08/2023    2:20 PM 07/27/2023   12:57 PM  Depression screen PHQ 2/9  Decreased Interest   0  0  Down, Depressed, Hopeless   0  0  PHQ - 2 Score   0  0  Altered sleeping       Tired, decreased energy       Change in appetite       Feeling bad or failure about yourself        Trouble concentrating       Moving slowly or fidgety/restless       Suicidal thoughts       PHQ-9 Score       Difficult doing work/chores          Information is confidential and restricted. Go to Review Flowsheets to unlock data.       Assessment & Plan:     HIV Infection -  HIV infection is well-controlled with current antiretroviral therapy (ART). CD4 count was last checked in April and was 980, consistently between 900 and 1300, indicating good immune function. Viral load remains undetectable. No drug interactions with Juluca . She is taking it correctly with food and no acid reducers.  - Continue current ART regimen with Juluca . - Order CD4 count and viral load tests as per routine monitoring schedule. - Schedule follow-up in six months for routine HIV care.  Chronic kidney disease, Stage 3 vs Synthetic Creatinine bump from DTG -  GFR normal and Cr recently 1.07. All signs of synthetic function are great. No acute concerns at this time. - Monitor kidney function tests biannually. - Continue to  manage blood pressure and avoid diabetes to maintain kidney health. - Encourage smoking cessation to improve overall health and kidney function.  Mental Health -  Medications undergoing management from [facility] team. No interactions and med list kept up to date.   Tobacco use - She is actively reducing tobacco use and has a plan to quit smoking. Currently down to two cigarettes per day with a goal to quit completely by October. - Support smoking cessation efforts and encourage complete cessation by October.     Meds ordered this encounter  Medications   dolutegravir -rilpivirine  (JULUCA ) 50-25 MG tablet    Sig: Take 1 tablet by mouth daily before lunch.    Dispense:  30 tablet    Refill:  5    Prescription Type::   Renewal   Orders Placed This Encounter  Procedures   MM 3D SCREENING MAMMOGRAM BILATERAL BREAST    Standing Status:   Future    Expiration Date:   03/09/2025    Reason for Exam (SYMPTOM  OR DIAGNOSIS REQUIRED):   breast cancer screen    Is the patient pregnant?:   No    Preferred imaging location[facility]   MENINGOCOCCAL MCV4O   HIV 1 RNA quant-no reflex-bld   T-helper cells (CD4) count   Measles/Mumps/Rubella Immunity   COMPLETE METABOLIC PANEL WITHOUT GFR   Return in about 6 months (around 09/09/2024).   Corean Fireman, MSN, NP-C [facility]  [email or web address] Pager: [phone] Office: [phone] [facility] Main Line: [phone] *Secure Chat Communication Welcome

## 2024-03-09 NOTE — Patient Instructions (Addendum)
 Nice to see you!   RSV vaccine recommended after 60.   Please schedule a vaccine only visit in 1 month to get COVID and Flu shot booster   Smoking Cessation: Quitline[place] 1-800-QUIT-NOW 616-116-9206); Espaol: 1-855-Djelo-Ya (1-([phone] 462-9041) [email or web address]

## 2024-03-10 ENCOUNTER — Ambulatory Visit: Payer: Self-pay | Admitting: Infectious Diseases

## 2024-03-10 ENCOUNTER — Ambulatory Visit (INDEPENDENT_AMBULATORY_CARE_PROVIDER_SITE_OTHER): Payer: MEDICAID | Admitting: Psychiatry

## 2024-03-10 VITALS — BP 153/95 | Ht 67.0 in | Wt 202.8 lb

## 2024-03-10 DIAGNOSIS — F4000 Agoraphobia, unspecified: Secondary | ICD-10-CM

## 2024-03-10 DIAGNOSIS — F250 Schizoaffective disorder, bipolar type: Secondary | ICD-10-CM | POA: Diagnosis not present

## 2024-03-10 DIAGNOSIS — F259 Schizoaffective disorder, unspecified: Secondary | ICD-10-CM

## 2024-03-10 DIAGNOSIS — F411 Generalized anxiety disorder: Secondary | ICD-10-CM | POA: Diagnosis not present

## 2024-03-10 DIAGNOSIS — F4310 Post-traumatic stress disorder, unspecified: Secondary | ICD-10-CM | POA: Diagnosis not present

## 2024-03-10 DIAGNOSIS — F333 Major depressive disorder, recurrent, severe with psychotic symptoms: Secondary | ICD-10-CM

## 2024-03-10 LAB — T-HELPER CELLS (CD4) COUNT (NOT AT ARMC)
CD4 % Helper T Cell: 48 % (ref 33–65)
CD4 T Cell Abs: 794 /uL (ref 400–1790)

## 2024-03-10 MED ORDER — SERTRALINE HCL 100 MG PO TABS: 100.0000 mg | ORAL_TABLET | Freq: Every day | ORAL | 0 refills | Status: DC

## 2024-03-10 MED ORDER — HYDROXYZINE HCL 25 MG PO TABS: 25.0000 mg | ORAL_TABLET | Freq: Two times a day (BID) | ORAL | 1 refills | Status: DC | PRN

## 2024-03-10 MED ORDER — QUETIAPINE FUMARATE ER 300 MG PO TB24: 600.0000 mg | ORAL_TABLET | Freq: Every day | ORAL | 0 refills | Status: DC

## 2024-03-10 MED ORDER — LAMOTRIGINE 100 MG PO TABS: 100.0000 mg | ORAL_TABLET | Freq: Every day | ORAL | 0 refills | Status: DC

## 2024-03-11 LAB — MEASLES/MUMPS/RUBELLA IMMUNITY
Mumps IgG: <9 [AU]/ml — ABNORMAL LOW
Rubella: <0.9 {index} — ABNORMAL LOW
Rubeola IgG: <13.5 [AU]/ml — ABNORMAL LOW

## 2024-03-11 LAB — COMPLETE METABOLIC PANEL WITHOUT GFR
AG Ratio: 2 (calc) (ref 1.0–2.5)
ALT: 10 U/L (ref 6–29)
AST: 12 U/L (ref 10–35)
Albumin: 4.6 g/dL (ref 3.6–5.1)
Alkaline phosphatase (APISO): 72 U/L (ref 37–153)
BUN/Creatinine Ratio: 12 (calc) (ref 6–22)
BUN: 14 mg/dL (ref 7–25)
CO2: 25 mmol/L (ref 20–32)
Calcium: 9.4 mg/dL (ref 8.6–10.4)
Chloride: 108 mmol/L (ref 98–110)
Creat: 1.16 mg/dL — ABNORMAL HIGH (ref 0.50–1.03)
Globulin: 2.3 g/dL (ref 1.9–3.7)
Glucose, Bld: 84 mg/dL (ref 65–99)
Potassium: 4 mmol/L (ref 3.5–5.3)
Sodium: 142 mmol/L (ref 135–146)
Total Bilirubin: 0.7 mg/dL (ref 0.2–1.2)
Total Protein: 6.9 g/dL (ref 6.1–8.1)

## 2024-03-11 LAB — HIV-1 RNA QUANT-NO REFLEX-BLD
HIV 1 RNA Quant: NOT DETECTED {copies}/mL
HIV-1 RNA Quant, Log: NOT DETECTED {Log_copies}/mL

## 2024-03-22 ENCOUNTER — Encounter (HOSPITAL_COMMUNITY): Payer: Self-pay

## 2024-03-22 ENCOUNTER — Ambulatory Visit (INDEPENDENT_AMBULATORY_CARE_PROVIDER_SITE_OTHER): Payer: MEDICAID | Admitting: Mental Health

## 2024-03-22 DIAGNOSIS — F4310 Post-traumatic stress disorder, unspecified: Secondary | ICD-10-CM | POA: Diagnosis not present

## 2024-03-22 DIAGNOSIS — F411 Generalized anxiety disorder: Secondary | ICD-10-CM

## 2024-03-22 DIAGNOSIS — F250 Schizoaffective disorder, bipolar type: Secondary | ICD-10-CM
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
## 2024-04-11 ENCOUNTER — Other Ambulatory Visit: Payer: Self-pay

## 2024-04-11 ENCOUNTER — Ambulatory Visit (INDEPENDENT_AMBULATORY_CARE_PROVIDER_SITE_OTHER): Payer: MEDICAID

## 2024-04-11 DIAGNOSIS — B20 Human immunodeficiency virus [HIV] disease: Secondary | ICD-10-CM | POA: Diagnosis not present

## 2024-04-11 DIAGNOSIS — Z23 Encounter for immunization: Secondary | ICD-10-CM | POA: Diagnosis not present

## 2024-04-11 NOTE — Progress Notes (Signed)
 Nurse Visit  Tracy [name] February 17, 1966   Medications administered: none  Immunizations administered: MMR  Patient tolerated well.   Clinic is out of flu vaccines. [name] will go to the pharmacy next door for flu and covid vaccines.   [name], BSN, RN

## 2024-04-11 NOTE — Telephone Encounter (Signed)
 Spoke with Sung and discussed that MMR vaccine is pork-derived. Apologized that RN was unaware until after vaccine was given, patient voiced understanding and was not upset. Discussed with her that we would not schedule second dose due to religious preferences. Notified Corean Fireman, NP.   Lakina Mcintire, BSN, RN

## 2024-04-12 ENCOUNTER — Encounter: Payer: Self-pay | Admitting: Physician Assistant

## 2024-04-12 ENCOUNTER — Ambulatory Visit: Payer: MEDICAID | Admitting: Physician Assistant

## 2024-04-12 NOTE — Progress Notes (Incomplete)
 Assessment/Plan:     Memory impairment of unclear etiology, likely multifactorial***  Tracy [name] is a very pleasant 58 y.o. 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 Patient is noted on the dementia medication at this time.***Etiology of her concerns are likely multifactorial.***.  She is yet to have a brain MRI ordered during her last visit.  Patient is able to participate in her ADLs and to drive without difficulties mood is controlled with behavioral therapy     Recommendations:   Follow up in 1 year or sooner depending on the MRI results*** Recommend proceeding with MRI of the brain for evaluation of structural abnormalities and vascular load Continue B12 supplements*** Recommend good control of cardiovascular risk factors Continue to control mood as per [facility], she is on Lamictal , Seroquel , Atarax  as needed   Subjective:   This patient is here alone. Previous records as well as any outside records available were reviewed prior to todays visit.   Patient was last seen on 12/09/2023 with [facility] 24/30***.    Any changes in memory since last visit? .  Continues to have some difficulty remembering new information, recent conversations, names, having to write notes or ask her mother to remind her of appointments.  Known flight of ideas (psychoaffective disorder) repeats oneself?  Endorsed for the last year Disoriented when walking into a room?  Patient denies ***  Misplacing objects?  Patient denies   Wandering behavior?   Denies. Any personality changes since last visit? Denies.   Any worsening depression?:  Has a history of psychoaffective disorder, MDD, paranoia, PTSD, binge eating disorder  he is on Seroquel  600 mg, Zoloft .  She sees behavioral health monthly. Hallucinations or paranoia?  She hears voices that she attributed to her psychiatric disorder.  She also has visual hallucinations, for example she may see someone in her backyard.*** Seizures?   Denies.    Any sleep changes? Sleeps well***. Does not sleep very well***.   Denies vivid dreams, REM behavior or sleepwalking   Sleep apnea?   denies ***  Any hygiene concerns?   Denies.   Independent of bathing and dressing?  Endorsed  Does the patient needs help with medications?  Husband is in charge *** Who is in charge of the finances?  Husband is in charge   *** Any changes in appetite?  denies ***   Patient have trouble swallowing?  Denies.   Does the patient cook?  Any kitchen accidents such as leaving the stove on?   Denies.   Any headaches?   Has a history of chronic headaches, controlled with sumatriptan and Tylenol  as needed.*** Vision changes? Denies. Chronic pain?  Has she history of chronic back pain after 2 back surgeries, uses topical agents, Tylenol . Ambulates with difficulty?    Denies. ***  Recent falls or head injuries?    Denies.      Unilateral weakness, numbness or tingling?  In the past, the patient presented with strokelike symptoms felt to be functional by IP neurology.   Any tremors?  Denies.   Any anosmia?    Denies.   Any incontinence of urine?  Denies.   Any bowel dysfunction?  He has issues with constipation Patient lives with her husband her sister, her mother.*** Does the patient drive?  Yes, denies getting lost recently*** Tobacco?  She no longer smokes for the last 8 months  Initial visit 12/09/2023 How long did patient have memory difficulties?  For about 6 months. Patient reports some difficulty remembering new information, recent conversations, names.  Short-term memory is worse than long-term memory.  She has to write notes or ask her mother to remind her of appointments etc.  She has  known flight of ideas in view of psychoaffective disorder. repeats oneself?  Endorsed, for the last 6 months Disoriented when walking into a room? Denies    Leaving objects in unusual places?  Denies.   Wandering behavior? Denies.   Any personality changes, or depression, anxiety?  Has a history of psychoaffective disorder, MDD, paranoia PTSD, binge eating disorder on Seroquel  600 mg.  She sees behavioral health monthly.  Hallucinations?  She hears voices but this is attributed to her psychiatric disorder.  She also sees visual hallucinations, may see someone in the backyard.  Seizures? Denies.    Any sleep changes?  Sometimes she does not sleep well, especially when flashbacks.  Denies REM behavior or sleepwalking   Sleep apnea? Denies.   Any hygiene concerns?  Denies.   Independent of bathing and dressing? Endorsed  Does the patient need help with medications?  Husband is in charge   Who is in charge of the finances? He is in charge as she was forgetting to pay bills early this year.     Any changes in appetite?   .Favors carbs and snacks. She has binging behavior.  She drinks plenty water.    Patient have trouble swallowing?  Denies.   Does the patient cook? No.   Any headaches?  She has a history of chronic headaches, controlled with sumatriptan as needed. She however takes tylenol  twice a day   Chronic pain? Chronic back pain, after 2 back surgeries. uses topical agents and tylenol    Ambulates with difficulty? Denies. Walks frequently Recent falls or head injuries? Yesterday she fell when she got dizzy, maybe the sugars.  No LOC, No head injury. She was recently started on Zoloft .  Vision changes?  Denies any new issues.  Any strokelike symptoms?  She had stroke like symptoms last June 2024 but it was felt to be functional by IP neurology. it was a heat stroke. Workup was negative. MRI brain at the time yielded normal results, mild chronic vascular changes.  Any tremors? Denies.    Any anosmia? Denies.   Any incontinence of urine? Denies.   Any bowel dysfunction?Constipation    Patient lives with husband, her sister and her mother.   History of heavy alcohol intake? Denies.   History of heavy tobacco use? Quit cigarettes 2 months ago  Prior history of substance abuse, quitting 13 years ago. Family history of dementia?  Maternal Aunt with dementia  Does patient drive?  Yes, has gotten lost in Jan and April .Could not figure out how to get out of the neighborhood.     Past Medical History:  Diagnosis Date   Arthritis    Chronic back pain    Chronic headaches    Chronic kidney disease    Depression    HIV infection (HCC)    Joint pain    Localized swelling of both lower legs    Legs, feet and hands   Pre-diabetes    Pulmonary embolism (HCC)    on Xarelto    Schizoaffective disorder (HCC)    Seasonal allergies    Stroke (cerebrum) (  HCC)    patient denies, states it was a heat stroke   Weight loss      Past Surgical History:  Procedure Laterality Date   ABDOMINAL EXPOSURE N/A 07/27/2018   Procedure: ABDOMINAL EXPOSURE;  Surgeon: Serene Gaile ORN, MD;  Location: [facility] OR;  Service: Vascular;  Laterality: N/A;   ABDOMINAL HYSTERECTOMY     ANTERIOR LUMBAR FUSION N/A 07/27/2018   Procedure: ANTERIOR LUMBAR FUSION L5-S1;  Surgeon: Burnetta Aures, MD;  Location: [facility] OR;  Service: Orthopedics;  Laterality: N/A;  3 hrs/ Dr. Serene to do approach HIV Positive   BREAST EXCISIONAL BIOPSY Right 1990's   BREAST SURGERY     TRANSFORAMINAL LUMBAR INTERBODY FUSION (TLIF) WITH PEDICLE SCREW FIXATION 1 LEVEL N/A 10/02/2021   Procedure: TRANSFORAMINAL LUMBAR INTERBODY FUSION LUMBAR FOUR THROUGH FIVE;  Surgeon: Burnetta Aures, MD;  Location: [facility] OR;  Service: Orthopedics;  Laterality: N/A;     PREVIOUS MEDICATIONS:   CURRENT MEDICATIONS:  Outpatient Encounter Medications as of 04/12/2024  Medication Sig   dolutegravir -rilpivirine  (JULUCA ) 50-25 MG tablet Take 1 tablet by  mouth daily before lunch.   hydrOXYzine  (ATARAX ) 25 MG tablet Take 1 tablet (25 mg total) by mouth 2 (two) times daily as needed.   lamoTRIgine  (LAMICTAL ) 100 MG tablet Take 1 tablet (100 mg total) by mouth daily.   QUEtiapine  (SEROQUEL  XR) 300 MG 24 hr tablet Take 2 tablets (600 mg total) by mouth at bedtime.   rivaroxaban  (XARELTO ) 20 MG TABS tablet Take 20 mg by mouth at bedtime.   sertraline  (ZOLOFT ) 100 MG tablet Take 1 tablet (100 mg total) by mouth daily.   No facility-administered encounter medications on file as of 04/12/2024.     Objective:     PHYSICAL EXAMINATION:    VITALS:  There were no vitals filed for this visit.  GEN:  The patient appears stated age and is in NAD. HEENT:  Normocephalic, atraumatic.   Neurological examination:  General: NAD, well-groomed, appears stated age. Orientation: The patient is alert. Oriented to person, place and not to date.*** Cranial nerves: There is good facial symmetry.The speech is fluent and clear. No aphasia or dysarthria. Fund of knowledge is appropriate. Recent memory impaired and remote memory is normal.  Attention and concentration are normal.  Able to name objects and repeat phrases.  Hearing is intact to conversational tone ***.   Delayed recall *** Sensation: Sensation is intact to light touch throughout Motor: Strength is at least antigravity x4. DTR's 2/4 in UE/LE      12/09/2023   12:00 PM  Montreal Cognitive Assessment   Visuospatial/ Executive (0/5) 4  Naming (0/3) 2  Attention: Read list of digits (0/2) 2  Attention: Read list of letters (0/1) 1  Attention: Serial 7 subtraction starting at 100 (0/3) 2  Language: Repeat phrase (0/2) 0  Language : Fluency (0/1) 1  Abstraction (0/2) 2  Delayed Recall (0/5) 5  Orientation (0/6) 4  Total 23  Adjusted Score (based on education) 24        No data to display             Movement examination: Tone: There is normal tone in the UE/LE Abnormal movements:  no  tremor.  No myoclonus.  No asterixis.   Coordination:  There is no decremation with RAM's. Normal finger to nose  Gait and Station: The patient has no difficulty arising out of a deep-seated chair without the use of the hands. The patient's stride length is good.  Gait is cautious and narrow.   Thank you for allowing us  the opportunity to participate in the care of this nice patient. Please do not hesitate to contact us  for any questions or concerns.   Total time spent on today's visit was *** minutes dedicated to this patient today, preparing to see patient, examining the patient, ordering tests and/or medications and counseling the patient, documenting clinical information in the EHR or other health record, independently interpreting results and communicating results to the patient/family, discussing treatment and goals, answering patient's questions and coordinating care.  Cc:  Ezra Montie Aquas, FNP  Camie Sevin 04/12/2024 5:28 AM

## 2024-04-13 ENCOUNTER — Ambulatory Visit: Payer: MEDICAID

## 2024-04-24 NOTE — Progress Notes (Signed)
 [facility] MD Outpatient Progress Note  Televisit via video: I connected with Tracy [name] on 10/24 at  8:00 AM EDT by a video enabled telemedicine application and verified that I am speaking with the correct person using two identifiers.  Location: Patient: home Provider: office   I discussed the limitations of evaluation and management by telemedicine and the availability of in person appointments. The patient expressed understanding and agreed to proceed.  I discussed the assessment and treatment plan with the patient. The patient was provided an opportunity to ask questions and all were answered. The patient agreed with the plan and demonstrated an understanding of the instructions.   The patient was advised to call back or seek an in-person evaluation if the symptoms worsen or if the condition fails to improve as anticipated.   05/05/2024 7:59 AM Tracy [name]  MRN:  [id number]  Assessment:  Tracy [name] presents for follow-up evaluation. Patient appears to be doing well from a psychiatric standpoint with her current medication regimen. She also has a strong family support system and is seeing a therapist on a monthly basis whom she connects well with. She is also in the contemplative stage of decreasing cigarette use, agreeable to retry the nicotine  patches that her ID physician prescribed her. Patient had a fleeting thought of passive SI last week and she voiced motivating factors on why she would not act on the thought, contracting for safety. No medication changes at this time, f/u in 2-3 months.   Plan:  #Unspecified mood disorder (Bipolar vs schizoaffective) Interventions:  -- Continue Zoloft  100 mg daily -- Continue Seroquel  600 mg nightly  A1c: 5.5 Lipid panel: chol 201 TSH WNL CBC WNL CMP elevated Cr (has CKD) Labs updated on 08/2023 EKG: Qtc is 397 on 12/2023 AIMS exam on 03/10/2024 normal -- Continue Lamictal  100 mg daily   #GAD #Agoraphobia # PTSD Interventions: --  Zoloft  as above - Continue hydroxyzine  25 mg twice daily as needed for anxiety -- Sees Bernice Rao for therapy, about monthly   # Binge eating disorder Interventions: wellbutrin  -- Recommend nutritionist   Patient was given contact information for behavioral health clinic and was instructed to call 911 for emergencies.   Identifying Information: Tracy [name] is a 58 y.o. female with a history of unspecified mood disorder (Bipolar vs schizoaffective) who is an established patient with [facility] for management of depression and anxiety  Subjective:  Chief Complaint: Med mgmt  Interval History:   Patient reports feeling okay today. Since the previous visit, she notes that she won her court case and she feels good about this. She enjoys her home situation and spending time with her family. She notes watching her grandson most of her days. She denies having stressors at this time, stating that the hydroxyzine  helps for her anxiety. She states she could go a week without taking hydroxyzine . She is enjoying therapy.  Patient reports good sleep, reporting typically getting 8 hours nightly. Patient reports good appetite, stating she does not have an issue with this at this time.   Patient denies current SI, HI, and AVH. She states last week, she had a thought I dont want to do this anymore but states the thought was fleeting and she would not act on the thought because she has family. We discussed a safety plan including warning signs, coping strategies, and possible people to contact and resources to utilize if SI worsen.   Substance use:  Smoking: yes, 1-2 cigarettes daily since 58  years old. She states previously trying nicotine  patch. She is open to retrying, received patches from her ID doctor. Alcohol: on special occasions like birthdays and holidays- 2 margaritas at a time, never goes over 3 Illicit drugs: clean since 2007, relapsed, clean for 3  years -previously used crack, daily 18 years ago   Past Psychiatric History:  Diagnoses: Depression, schizophrenia  Medication trials: Prozac (did not like like it- reports having increased suicidal thoughts), Paxil, Trazodone (did not feel like it worked) Current: Seroquel , Wellbutrin , Lamictal  Previous psychiatrist/therapist: Unsure when last time she saw one, thinks it was about one year ago; denies seeing a therapist but plan for a first appointment in March Hospitalizations: Yes, about 11 times, first time was 1990s due to suicide attempt and most recently 3 years ago due to SI Suicide attempts: 4-5 times- through overdosing; most recently thre years ago SIB: denies Hx of violence towards others: yes, since 58 years old- used to fight Current access to guns: Denies    Family Psychiatric History: sisters-unsure but states theres mental health problems   Social History:   Living: Armed forces operational officer, 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 School: 10th grade Job: no, on disability since the 90's for mental health Married/Children: married since 2008, one child Support: husband, daughter (lives 7 minutes away) Legal History: yes, assault with a deadly weapon - was in prison for 24 months, three times  Past Medical History:  Past Medical History:  Diagnosis Date   Arthritis    Chronic back pain    Chronic headaches    Chronic kidney disease    Depression    HIV infection (HCC)    Joint pain    Localized swelling of both lower legs    Legs, feet and hands   Pre-diabetes    Pulmonary embolism (HCC)    on Xarelto    Schizoaffective disorder (HCC)    Seasonal allergies    Stroke (cerebrum) (HCC)    patient denies, states it was a heat stroke   Weight loss     Past Surgical History:  Procedure Laterality Date   ABDOMINAL EXPOSURE N/A 07/27/2018   Procedure: ABDOMINAL EXPOSURE;  Surgeon: Serene Gaile ORN, MD;  Location: [facility] OR;  Service: Vascular;  Laterality: N/A;   ABDOMINAL  HYSTERECTOMY     ANTERIOR LUMBAR FUSION N/A 07/27/2018   Procedure: ANTERIOR LUMBAR FUSION L5-S1;  Surgeon: Burnetta Aures, MD;  Location: [facility] OR;  Service: Orthopedics;  Laterality: N/A;  3 hrs/ Dr. Serene to do approach HIV Positive   BREAST EXCISIONAL BIOPSY Right 1990's   BREAST SURGERY     TRANSFORAMINAL LUMBAR INTERBODY FUSION (TLIF) WITH PEDICLE SCREW FIXATION 1 LEVEL N/A 10/02/2021   Procedure: TRANSFORAMINAL LUMBAR INTERBODY FUSION LUMBAR FOUR THROUGH FIVE;  Surgeon: Burnetta Aures, MD;  Location: [facility] OR;  Service: Orthopedics;  Laterality: N/A;   Family History:  Family History  Problem Relation Age of Onset   Diabetes Mother    Hypertension Mother    Tremor Mother    High blood pressure Sister    Breast cancer Maternal Aunt    Colon cancer Maternal Aunt    High blood pressure Maternal Uncle    Diabetes Maternal Uncle    Rectal cancer Neg Hx    Esophageal cancer Neg Hx    Stomach cancer Neg Hx      Social History   Socioeconomic History   Marital status: Married    Spouse name: Not on file   Number of children: 1  Years of education: 10   Highest education level: Not on file  Occupational History   Occupation: NA  Tobacco Use   Smoking status: Some Days    Current packs/day: 0.00    Types: Cigarettes    Last attempt to quit: 04/26/2016    Years since quitting: 8.0   Smokeless tobacco: Never   Tobacco comments:    2 packs per week   Vaping Use   Vaping status: Former  Substance and Sexual Activity   Alcohol use: No    Alcohol/week: 0.0 standard drinks of alcohol    Comment: quit 12/2005   Drug use: Not Currently    Types: Crack cocaine    Comment: Previous drug use, quit 2004-05; relapsed 05/2018, clean since 06/08/2018   Sexual activity: Yes    Partners: Male    Birth control/protection: None  Other Topics Concern   Not on file  Social History Narrative   Lives at home w/ her husband   Caffeine: 1/2 c coffee per day   Right handed   Lives with  husband    One floor home   Social Drivers of Health   Financial Resource Strain: Medium Risk (01/27/2024)   Overall Financial Resource Strain (CARDIA)    Difficulty of Paying Living Expenses: Somewhat hard  Food Insecurity: No Food Insecurity (12/29/2022)   Hunger Vital Sign    Worried About Running Out of Food in the Last Year: Never true    Ran Out of Food in the Last Year: Never true  Transportation Needs: No Transportation Needs (12/29/2022)   PRAPARE - Administrator, Civil Service (Medical): No    Lack of Transportation (Non-Medical): No  Physical Activity: Insufficiently Active (01/27/2024)   Exercise Vital Sign    Days of Exercise per Week: 7 days    Minutes of Exercise per Session: 20 min  Stress: Stress Concern Present (01/27/2024)   [employer] of Occupational Health - Occupational Stress Questionnaire    Feeling of Stress: Very much  Social Connections: Moderately Integrated (01/27/2024)   Social Connection and Isolation Panel    Frequency of Communication with Friends and Family: More than three times a week    Frequency of Social Gatherings with Friends and Family: Never    Attends Religious Services: 1 to 4 times per year    Active Member of [employer] or Organizations: No    Attends Banker Meetings: Never    Marital Status: Married    Allergies:  Allergies  Allergen Reactions   Tetracycline Rash   Glyceryl Stear-Peg 100 Stear Other (See Comments)   Porcine (Pork) Protein-Containing Drug Products Other (See Comments)    Religious practice   Cyclobenzaprine Rash and Itching   Moxifloxacin Rash   Tetracyclines & Related Itching and Rash   Tramadol Itching and Rash    Current Medications: Current Outpatient Medications  Medication Sig Dispense Refill   dolutegravir -rilpivirine  (JULUCA ) 50-25 MG tablet Take 1 tablet by mouth daily before lunch. 30 tablet 5   hydrOXYzine  (ATARAX ) 25 MG tablet Take 1 tablet (25 mg total) by mouth 2 (two)  times daily as needed. 60 tablet 1   lamoTRIgine  (LAMICTAL ) 100 MG tablet Take 1 tablet (100 mg total) by mouth daily. 60 tablet 0   QUEtiapine  (SEROQUEL  XR) 300 MG 24 hr tablet Take 2 tablets (600 mg total) by mouth at bedtime. 120 tablet 0   rivaroxaban  (XARELTO ) 20 MG TABS tablet Take 20 mg by mouth at bedtime.  sertraline  (ZOLOFT ) 100 MG tablet Take 1 tablet (100 mg total) by mouth daily. 60 tablet 0   No current facility-administered medications for this visit.     Objective: Psychiatric Specialty Exam: General Appearance: appears at stated age, casually dressed and groomed   Behavior: pleasant and cooperative   Psychomotor Activity: no psychomotor agitation or retardation noted   Eye Contact: fair  Speech: normal amount, volume and fluency    Mood: euthymic  Affect: congruent, pleasant and interactive   Thought Process: linear, goal directed, no circumstantial or tangential thought process noted, no racing thoughts or flight of ideas  Descriptions of Associations: intact   Thought Content Hallucinations: denies AH, VH , does not appear responding to stimuli  Delusions: no paranoia, delusions of control, grandeur, ideas of reference, thought broadcasting, and magical thinking  Suicidal Thoughts: denies SI, intention, plan  Homicidal Thoughts: denies HI, intention, plan   Alertness/Orientation: alert and fully oriented   Insight: fair Judgment: fair  Memory: intact   Executive Functions  Concentration: intact  Attention Span: fair  Recall: intact  Fund of Knowledge: fair   Physical Exam  General: Pleasant, well-appearing. No acute distress. Pulmonary: Normal effort. No wheezing or rales. Neuro: A&Ox3.No focal deficit.  Review of Systems  No reported symptoms  Metabolic Disorder Labs: Lab Results  Component Value Date   HGBA1C 5.2 06/21/2018   MPG 102.54 06/21/2018   MPG 108 12/30/2016   Lab Results  Component Value Date   PROLACTIN 127.7 (H)  06/22/2018   Lab Results  Component Value Date   CHOL 209 (H) 05/22/2022   TRIG 92 05/22/2022   HDL 72 05/22/2022   CHOLHDL 2.9 05/22/2022   VLDL 27 06/21/2018   LDLCALC 117 (H) 05/22/2022   LDLCALC 85 06/21/2018   Lab Results  Component Value Date   TSH 0.656 12/19/2021   TSH 3.904 06/21/2018    Therapeutic Level Labs: No results found for: LITHIUM No results found for: VALPROATE No results found for: CBMZ  Screenings:  AUDIT    Flowsheet Row [facility]  Alcohol Use Disorder Identification Test Final Score (AUDIT) 0   CAGE-AID    Flowsheet Row ED to Hosp-Admission (Discharged) from 12/28/2022 in [facility]  CAGE-AID Score 0   GAD-7    Flowsheet Row Office Visit from 03/09/2024 in [facility] [place]. [facility] Counselor from 01/27/2024 in [facility]  Total GAD-7 Score 19 19   PHQ2-9    Flowsheet Row Office Visit from 03/09/2024 in [facility] - [facility] [place]. [facility] Counselor from 01/27/2024 in [facility] Clinical Support from 11/03/2023 in [facility] Office Visit from 10/19/2023 in [facility] - A Dept Of [place]. [facility] Office Visit from 09/08/2023 in [facility]  PHQ-2 Total Score 2 6 3  0 4  PHQ-9 Total Score 8 23 13  -- 18   Flowsheet Row Counselor from 01/27/2024 in [facility] ED from 01/05/2024 in [facility] ED to Hosp-Admission (Discharged) from 12/28/2022 in [facility] [facility]  C-SSRS RISK CATEGORY No Risk No Risk No Risk    Ismael Franco, MD PGY-3 Psychiatry Resident

## 2024-05-05 ENCOUNTER — Telehealth (INDEPENDENT_AMBULATORY_CARE_PROVIDER_SITE_OTHER): Payer: MEDICAID | Admitting: Psychiatry

## 2024-05-05 DIAGNOSIS — F39 Unspecified mood [affective] disorder: Secondary | ICD-10-CM | POA: Insufficient documentation

## 2024-05-05 DIAGNOSIS — F411 Generalized anxiety disorder: Secondary | ICD-10-CM

## 2024-05-05 DIAGNOSIS — F259 Schizoaffective disorder, unspecified: Secondary | ICD-10-CM

## 2024-05-05 DIAGNOSIS — F4000 Agoraphobia, unspecified: Secondary | ICD-10-CM

## 2024-05-05 DIAGNOSIS — F250 Schizoaffective disorder, bipolar type: Secondary | ICD-10-CM

## 2024-05-05 DIAGNOSIS — F4310 Post-traumatic stress disorder, unspecified: Secondary | ICD-10-CM | POA: Diagnosis not present

## 2024-05-05 DIAGNOSIS — F333 Major depressive disorder, recurrent, severe with psychotic symptoms: Secondary | ICD-10-CM

## 2024-05-05 MED ORDER — QUETIAPINE FUMARATE ER 300 MG PO TB24: 600.0000 mg | ORAL_TABLET | Freq: Every day | ORAL | 2 refills | Status: DC

## 2024-05-05 MED ORDER — LAMOTRIGINE 100 MG PO TABS: 100.0000 mg | ORAL_TABLET | Freq: Every day | ORAL | 0 refills | Status: DC

## 2024-05-05 MED ORDER — SERTRALINE HCL 100 MG PO TABS: 100.0000 mg | ORAL_TABLET | Freq: Every day | ORAL | 0 refills | Status: DC

## 2024-05-05 MED ORDER — HYDROXYZINE HCL 25 MG PO TABS: 25.0000 mg | ORAL_TABLET | Freq: Two times a day (BID) | ORAL | 1 refills | Status: DC | PRN

## 2024-05-05 NOTE — Addendum Note (Signed)
 Addended by: IZELLA CARAWAY B on: 05/05/2024 08:21 AM   Modules accepted: Level of Service

## 2024-05-09 ENCOUNTER — Ambulatory Visit (INDEPENDENT_AMBULATORY_CARE_PROVIDER_SITE_OTHER): Payer: MEDICAID | Admitting: Mental Health

## 2024-05-09 DIAGNOSIS — F411 Generalized anxiety disorder: Secondary | ICD-10-CM | POA: Diagnosis not present

## 2024-05-09 DIAGNOSIS — F259 Schizoaffective disorder, unspecified: Secondary | ICD-10-CM

## 2024-05-09 NOTE — Progress Notes (Signed)
 THERAPIST PROGRESS NOTE Virtual Visit via Video Note  I connected with Tracy [name] on 05/09/24 at  9:00 AM EDT by a video enabled telemedicine application and verified that I am speaking with the correct person using two identifiers.  Location: Patient: address on file Provider: office   I discussed the limitations of evaluation and management by telemedicine and the availability of in person appointments. The patient expressed understanding and agreed to proceed.  I discussed the assessment and treatment plan with the patient. The patient was provided an opportunity to ask questions and all were answered. The patient agreed with the plan and demonstrated an understanding of the instructions.   The patient was advised to call back or seek an in-person evaluation if the symptoms worsen or if the condition fails to improve as anticipated.  I provided 49 minutes of non-face-to-face time during this encounter.   Ty Bernice Savant, [facility]   Session Time: 9:03 am ( 49 minutes)  Participation Level: Active  Behavioral Response: CasualAlertAnxious  Type of Therapy: Individual Therapy  Treatment Goals addressed: STG: Learn to control my feelings,anger. Jalacia will increase stability in moods AEB development of x 3 effective emotional regulation skills with no more than x 1 emotional outburst weekly within the next 90 days.    Tracy [name] myself better. Annelie will increase feelings of worth AEB engagement in self-care weekly with ability to establish healthy boundaries and assertive communication style within the next 90 days.   ProgressTowards Goals: Progressing  Interventions: Supportive  Summary:  Tracy [name] is a 58 y.o. female who presents with dx of PTSD, schizoaffective disorder bipolar type and generalized anxiety disorder. [name] presents for session alert and oriented mood and affect anxious low initially, with chief complaint,  overly emotional, over thinking.  This shut down. Notes worry of not receiving food stamps next month as well as concern for not receiving disability check the next month as well, noting for husband and herself to both be on disability. Shares thoughts on growing up with lacking food and for this to be triggering thoughts and flashbacks from childhood. Shares also feelings of dread about leaving out of her home and notes has not left home for the past week. Notes last presentation was to court for incident that occurred in ED and reports for her charges to have been dropped. Engages with therapist to process feelings of anxiety and gains additional information with support of therapist for disability payments to not be effected per social security website.  I feel like 10 pounds have been lifted. Shares awareness of food pantries in the area and regularly attends. Engages with therapist to identify distortions of catastrophizing and black and white thoughts of leaving home and able to explore ability to engage in helpful and balance thinking. Shares for previous skills discussed to have been very helpful and has been engaging in deep breathing for coping and self-soothing to manage emotions. Denies safety concerns. Ongoing work towards goals.  Suicidal/Homicidal: Nowithout intent/plan  Therapist Response: Therapist engaged [name] in therapy session. Assessed for confidential space and current location for tele-session. Provided safe space to share thoughts and feelings land current level of functioning. Active empathic listening; validated feelings and providing support. Explored ability to gain additional information to support in being prepared and appropriate level of concern. Explored SSI website and ability for pt to receive payments. Engages in education on realistic vs unrealistic anxiety and ability to navigate each. Encouraged gaining addional information and planning vs. Identifying distortions in  thought and ability to reframe.  Explored catastrophizng and black and white thinking and identification of gray or middle. Reviewed use of to support in regulating emotions and moods. Reviewed session and provided follow up.   Plan: Return again in  x 3 weeks.  Diagnosis: GAD (generalized anxiety disorder)  Schizoaffective disorder, unspecified type (HCC)  Collaboration of Care: Other None  Patient/Guardian was advised Release of Information must be obtained prior to any record release in order to collaborate their care with an outside provider. Patient/Guardian was advised if they have not already done so to contact the registration department to sign all necessary forms in order for us  to release information regarding their care.   Consent: Patient/Guardian gives verbal consent for treatment and assignment of benefits for services provided during this visit. Patient/Guardian expressed understanding and agreed to proceed.   Ty Asal Tavernier, [facility] 05/09/2024

## 2024-05-23 ENCOUNTER — Ambulatory Visit (INDEPENDENT_AMBULATORY_CARE_PROVIDER_SITE_OTHER): Payer: MEDICAID | Admitting: Mental Health

## 2024-05-23 DIAGNOSIS — F4310 Post-traumatic stress disorder, unspecified: Secondary | ICD-10-CM | POA: Diagnosis not present

## 2024-05-23 DIAGNOSIS — F259 Schizoaffective disorder, unspecified: Secondary | ICD-10-CM | POA: Diagnosis not present

## 2024-05-23 DIAGNOSIS — F411 Generalized anxiety disorder: Secondary | ICD-10-CM | POA: Diagnosis not present

## 2024-05-23 NOTE — Progress Notes (Unsigned)
 THERAPIST PROGRESS NOTE Virtual Visit via Video Note  I connected with Tracy [name] on 05/23/24 at 10:00 AM EST by a video enabled telemedicine application and verified that I am speaking with the correct person using two identifiers.  Location: Patient: [name] address on file Provider: office   I discussed the limitations of evaluation and management by telemedicine and the availability of in person appointments. The patient expressed understanding and agreed to proceed.  I discussed the assessment and treatment plan with the patient. The patient was provided an opportunity to ask questions and all were answered. The patient agreed with the plan and demonstrated an understanding of the instructions.   The patient was advised to call back or seek an in-person evaluation if the symptoms worsen or if the condition fails to improve as anticipated.  I provided 49 minutes of non-face-to-face time during this encounter.   Tracy [name], Tracy [name]   Session Time: 10:15 am (   Participation Level: Active  Behavioral Response: CasualAlertEuthymic  Type of Therapy: Individual Therapy  Treatment Goals addressed: STG: Learn to control my feelings,anger. Tracy [name] will increase stability in moods AEB development of x 3 effective emotional regulation skills with no more than x 1 emotional outburst weekly within the next 90 days.    Love myself better. Tracy [name] will increase feelings of worth AEB engagement in self-care weekly with ability to establish healthy boundaries and assertive communication style within the next 90 days.   ProgressTowards Goals: Progressing  Interventions: CBT and Supportive  Summary:  Tracy [name] is a 58 y.o. female who presents with dx of PTSD, schizoaffective disorder bipolar type and generalized anxiety disorder. Tracy [name] presents for session alert and oriented mood and affect adequate, stable. Notes chief complaint  I am better now. I been going  out. Shares ability to engage in community withf riends and attending church. Notes period of moods being up and down and thoughts of wanting to be protective of family. Engages with therapist sharing thoughtsof worry and on alert around police officers. Notes period of feelings of dread like it don't feel right like something bad is going to happen. Notes coping with deep breathing and finding activity around house to cmplete until feeling passing. Recepitive of educaion of PTSD and ways in which it can effect the body. Notes to feel as if she can be in fight mode and become reactive. Reports increased ability to delay reactions and decrease impulsiveness. Denies safety concerns. Ongoing work towards goals no emotional outbutst in past week, development of x 1 emtoional regulation skills. Notes some improvement with self care and feelings of self-worth  Suicidal/Homicidal: Nowithout intent/plan  Therapist Response: Therapist engaged Tracy [name] in therapy session. Assessed for confidential space and current location for tele-session. Provided safe space to share thoughts and feelings and current level of functioning. Active empathic listening; validated feelings and providing support. Explored hx of trauma and engaged in psycho-education on ways trauma manifest in body and nervous system. Educated on fight or flight response and increased hypervigilance with PTSD dx. Explored working to increase ability to regulate emotions and delay emotional impulsive responses in times of perceived threats. Reviewed session and provided follow up.   Plan: Return again in x 4 weeks.  Diagnosis: Schizoaffective disorder, unspecified type (HCC)  GAD (generalized anxiety disorder)  PTSD (post-traumatic stress disorder)  Collaboration of Care: Other None  Patient/Guardian was advised Release of Information must be obtained prior to any record release in order to collaborate their care with an  outside provider.  Patient/Guardian was advised if they have not already done so to contact the registration department to sign all necessary forms in order for us  to release information regarding their care.   Consent: Patient/Guardian gives verbal consent for treatment and assignment of benefits for services provided during this visit. Patient/Guardian expressed understanding and agreed to proceed.   Tracy [name], Tracy [name] 05/23/2024

## 2024-05-28 NOTE — Progress Notes (Signed)
 [facility] [address] [place] [address] Dept: [phone] Dept Fax: [phone]  New Patient Office Visit  Subjective:    Patient ID: Tracy [name], female    DOB: [birth year], 58 y.o..   MRN: [id number]  No chief complaint on file.  History of Present Illness:  Patient is in today to establish care.  Tracy [name] has a history of chronic kidney disease.   Tracy [name] has a history of anxiety, PTSD, and schizoaffective disorder followed by behavioral health. She takes sertraline  100 mg daily, quetiapine  300 mg daily, lamotrigine  100 mg, and hydroxyzine  25 mg as needed.  Tracy [name] has a history of HIV Disease managed with Juluca  50-25 mg daily.   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   Chronic anticoagulation 03/05/2023   Syncope and collapse 12/29/2022   Orthostatic hypotension 12/29/2022   Chronic kidney disease, stage III (moderate) (HCC) 12/23/2022   S/P lumbar fusion 10/02/2021   Status post hysterectomy 08/11/2020   Degenerative disc disease at L5-S1 level 07/27/2018   Schizophrenia, acute (HCC) 06/20/2018   Conversion disorder    Chronic headaches 12/29/2016   Dry skin dermatitis 02/10/2016   Itching due to drug 12/26/2015   HIV disease (HCC) 10/03/2015   Schizoaffective disorder (HCC) 10/03/2015   Chronic pain syndrome 10/03/2015   Cigarette smoker 10/03/2015   Past Surgical History:  Procedure Laterality Date   ABDOMINAL EXPOSURE N/A 07/27/2018   Procedure: ABDOMINAL EXPOSURE;  Surgeon: Serene Gaile ORN, MD;   Location: [facility] OR;  Service: Vascular;  Laterality: N/A;   ABDOMINAL HYSTERECTOMY     ANTERIOR LUMBAR FUSION N/A 07/27/2018   Procedure: ANTERIOR LUMBAR FUSION L5-S1;  Surgeon: Burnetta Aures, MD;  Location: [facility] OR;  Service: Orthopedics;  Laterality: N/A;  3 hrs/ Dr. Serene to do approach HIV Positive   BREAST EXCISIONAL BIOPSY Right 1990's   BREAST SURGERY     TRANSFORAMINAL LUMBAR INTERBODY FUSION (TLIF) WITH PEDICLE SCREW FIXATION 1 LEVEL N/A 10/02/2021   Procedure: TRANSFORAMINAL LUMBAR INTERBODY FUSION LUMBAR FOUR THROUGH FIVE;  Surgeon: Burnetta Aures, MD;  Location: [facility] OR;  Service: Orthopedics;  Laterality: N/A;   Family History  Problem Relation Age of Onset   Diabetes Mother    Hypertension Mother    Tremor Mother    High blood pressure Sister    Breast cancer Maternal Aunt    Colon cancer Maternal Aunt    High blood pressure Maternal Uncle    Diabetes Maternal Uncle    Rectal cancer Neg Hx    Esophageal cancer Neg Hx    Stomach cancer Neg Hx    Outpatient Medications Prior to Visit  Medication Sig Dispense Refill   dolutegravir -rilpivirine  (JULUCA ) 50-25 MG tablet Take 1 tablet by mouth daily before lunch. 30 tablet 5   hydrOXYzine  (ATARAX ) 25 MG tablet Take 1 tablet (25 mg total) by mouth 2 (two) times daily as needed. 60 tablet 1   lamoTRIgine  (LAMICTAL ) 100 MG tablet Take 1 tablet (100 mg total) by mouth daily. 90 tablet 0   QUEtiapine  (SEROQUEL  XR) 300 MG 24 hr tablet Take 2 tablets (600 mg total) by mouth at  bedtime. 60 tablet 2   rivaroxaban  (XARELTO ) 20 MG TABS tablet Take 20 mg by mouth at bedtime.     sertraline  (ZOLOFT ) 100 MG tablet Take 1 tablet (100 mg total) by mouth daily. 90 tablet 0   No facility-administered medications prior to visit.   Allergies  Allergen Reactions   Tetracycline Rash   Glyceryl Stear-Peg 100 Stear Other (See Comments)   Porcine (Pork) Protein-Containing Drug Products Other (See Comments)    Religious practice   Cyclobenzaprine  Rash and Itching   Moxifloxacin Rash   Tetracyclines & Related Itching and Rash   Tramadol Itching and Rash   Objective:   There were no vitals filed for this visit. There is no height or weight on file to calculate BMI.   General: Well developed, well nourished. No acute distress. HEENT: Normocephalic, non-traumatic. PERRL, EOMI. Conjunctiva clear. External ears normal. EAC and TMs normal bilaterally. Nose    clear without congestion or rhinorrhea. Mucous membranes moist. Oropharynx clear. Good dentition. Neck: Supple. No lymphadenopathy. No thyromegaly. Lungs: Clear to auscultation bilaterally. No wheezing, rales or rhonchi. CV: RRR without murmurs or rubs. Pulses 2+ bilaterally. Abdomen: Soft, non-tender. Bowel sounds positive, normal pitch and frequency. No hepatosplenomegaly. No rebound or guarding. Back: Straight. No CVA tenderness bilaterally. Extremities: Full ROM. No joint swelling or tenderness. No edema noted. Skin: Warm and dry. No rashes. Neuro: CN II-XII intact. Normal sensation and DTR bilaterally. Psych: Alert and oriented. Normal mood and affect.  Health Maintenance Due  Topic Date Due   DTaP/Tdap/Td (2 - Td or Tdap) 11/08/2023   Influenza Vaccine  02/11/2024   COVID-19 Vaccine (5 - 2025-26 season) 03/13/2024   Lab Results {Labs (Optional):29002}    Assessment & Plan:   Problem List Items Addressed This Visit   None   No follow-ups on file.   Garnette CHRISTELLA Simpler, MD  I,Emily Lagle,acting as a scribe for Garnette CHRISTELLA Simpler, MD.,have documented all relevant documentation on the behalf of Garnette CHRISTELLA Simpler, MD.  I, Garnette CHRISTELLA Simpler, MD, have reviewed all documentation for this visit. The documentation on 05/29/2024 for the exam, diagnosis, procedures, and orders are all accurate and complete.

## 2024-05-29 ENCOUNTER — Ambulatory Visit: Payer: Self-pay | Admitting: Family Medicine

## 2024-05-29 ENCOUNTER — Encounter: Payer: Self-pay | Admitting: Family Medicine

## 2024-05-29 ENCOUNTER — Ambulatory Visit: Payer: MEDICAID | Admitting: Family Medicine

## 2024-05-29 VITALS — BP 160/100 | HR 80 | Temp 97.3°F | Ht 67.0 in | Wt 202.2 lb

## 2024-05-29 DIAGNOSIS — F333 Major depressive disorder, recurrent, severe with psychotic symptoms: Secondary | ICD-10-CM

## 2024-05-29 DIAGNOSIS — F17200 Nicotine dependence, unspecified, uncomplicated: Secondary | ICD-10-CM | POA: Diagnosis not present

## 2024-05-29 DIAGNOSIS — N183 Chronic kidney disease, stage 3 unspecified: Secondary | ICD-10-CM

## 2024-05-29 DIAGNOSIS — Z13220 Encounter for screening for lipoid disorders: Secondary | ICD-10-CM

## 2024-05-29 DIAGNOSIS — I10 Essential (primary) hypertension: Secondary | ICD-10-CM | POA: Diagnosis not present

## 2024-05-29 DIAGNOSIS — N951 Menopausal and female climacteric states: Secondary | ICD-10-CM | POA: Diagnosis not present

## 2024-05-29 DIAGNOSIS — B20 Human immunodeficiency virus [HIV] disease: Secondary | ICD-10-CM

## 2024-05-29 DIAGNOSIS — Z7901 Long term (current) use of anticoagulants: Secondary | ICD-10-CM

## 2024-05-29 DIAGNOSIS — F4310 Post-traumatic stress disorder, unspecified: Secondary | ICD-10-CM

## 2024-05-29 DIAGNOSIS — F259 Schizoaffective disorder, unspecified: Secondary | ICD-10-CM

## 2024-05-29 LAB — COMPREHENSIVE METABOLIC PANEL WITH GFR
ALT: 13 U/L (ref 0–35)
AST: 15 U/L (ref 0–37)
Albumin: 5 g/dL (ref 3.5–5.2)
Alkaline Phosphatase: 67 U/L (ref 39–117)
BUN: 21 mg/dL (ref 6–23)
CO2: 27 meq/L (ref 19–32)
Calcium: 9.9 mg/dL (ref 8.4–10.5)
Chloride: 100 meq/L (ref 96–112)
Creatinine, Ser: 0.96 mg/dL (ref 0.40–1.20)
GFR: 65.96 mL/min (ref >60.00)
Glucose, Bld: 98 mg/dL (ref 70–99)
Potassium: 3.7 meq/L (ref 3.5–5.1)
Sodium: 139 meq/L (ref 135–145)
Total Bilirubin: 0.7 mg/dL (ref 0.2–1.2)
Total Protein: 7.7 g/dL (ref 6.0–8.3)

## 2024-05-29 LAB — LIPID PANEL
Cholesterol: 226 mg/dL — ABNORMAL HIGH (ref 0–200)
HDL: 105.4 mg/dL (ref >39.00)
LDL Cholesterol: 97 mg/dL (ref 0–99)
NonHDL: 120.7
Total CHOL/HDL Ratio: 2
Triglycerides: 119 mg/dL (ref 0.0–149.0)
VLDL: 23.8 mg/dL (ref 0.0–40.0)

## 2024-05-29 MED ORDER — BLOOD PRESSURE KIT
PACK | 0 refills | Status: AC
Start: 2024-05-29 — End: ?

## 2024-05-29 MED ORDER — FEZOLINETANT 45 MG PO TABS: 45.0000 mg | ORAL_TABLET | Freq: Every day | ORAL | 11 refills | Status: AC

## 2024-05-29 MED ORDER — AMLODIPINE BESYLATE-VALSARTAN 5-160 MG PO TABS: 1.0000 | ORAL_TABLET | Freq: Every day | ORAL | 11 refills | Status: AC

## 2024-05-29 NOTE — Assessment & Plan Note (Signed)
 Complains of worsening hot flashes which interfere with her daily functioning. Due to her prior pulmonary emboli, she is not a candidate for estrogen therapy. She has failed to have improvement of symptoms on her SSRI. I will see if we can get her approved for fezolinetant (Veozah) 45 mg daily. I will check baseline LFTs.

## 2024-05-29 NOTE — Assessment & Plan Note (Signed)
 I will recheck her eGFR today. Focus on blood pressure control, adequate hydration, and avoidance of nephrotoxic medications.

## 2024-05-29 NOTE — Assessment & Plan Note (Signed)
 Under the care of psychiatry. Continue lamotrigine  100 mg daily and quetiapine  300 mg daily.

## 2024-05-29 NOTE — Assessment & Plan Note (Signed)
 Under the care of psychiatry. Continue use of hydroxyzine  and sertraline  100 mg daily.

## 2024-05-29 NOTE — Assessment & Plan Note (Signed)
 Tracy [name] has hypertension. She has a history of moderate CKD. It will be important to control her blood pressure to reduce progression of CKD. I will start her on amlodipine-valsartan (Exforge) 5-160 mg daily. I will request a home BP cuff for her. I will see her back in 4 weeks to reassess her BP.

## 2024-05-29 NOTE — Assessment & Plan Note (Signed)
 Apparently had a prevoked PE. Continue rivaroxaban  20 mg daily for now. May not need this lifelong.

## 2024-05-29 NOTE — Assessment & Plan Note (Signed)
 As above. Under the care of psychiatry.

## 2024-05-29 NOTE — Assessment & Plan Note (Signed)
 Tracy [name] markedly reduced her smoking a year ago. A pack currently lasts her all month. I advise her to set a goal of stopping all tobacco use.

## 2024-05-29 NOTE — Assessment & Plan Note (Signed)
 Last CD4 count was at a good level. Continue to follow with ID. Continue  dolutegravir -rilpivirine  (Juluca ) 50-25 mg daily.

## 2024-05-30 ENCOUNTER — Telehealth: Payer: Self-pay

## 2024-05-30 ENCOUNTER — Other Ambulatory Visit (HOSPITAL_COMMUNITY): Payer: Self-pay

## 2024-05-30 ENCOUNTER — Encounter: Payer: Self-pay | Admitting: Family Medicine

## 2024-05-30 NOTE — Telephone Encounter (Signed)
 RX not available to submit electronically/not in system on cmm or latent. (Will attempt to Submit via Fax or Verbally

## 2024-05-30 NOTE — Telephone Encounter (Signed)
 Can you please and thank you complete the PA that is needed for Fezolinetant mg tabs.  Thanks. Dm/cma

## 2024-05-30 NOTE — Telephone Encounter (Signed)
 Copied from CRM #[id number]. Topic: Clinical - Medication Prior Auth >> May 30, 2024  3:22 PM Drema MATSU wrote: Reason for CRM: Adelade with My pharmacy is calling to inform pcp that pt is needing a prior Authorization for Fezolinetant 45 MG TABS that was called in on yesterday.

## 2024-05-31 ENCOUNTER — Telehealth: Payer: Self-pay

## 2024-05-31 ENCOUNTER — Other Ambulatory Visit (HOSPITAL_COMMUNITY): Payer: Self-pay

## 2024-05-31 NOTE — Telephone Encounter (Addendum)
 PER INSURANCE-Only Brand (Veozah /Brand) is Approved.There isnt a generic(which is what is on the patients profile/please correct RX on Med list) Also, p/a on file until 11.19.26    Pharmacy Patient Advocate Encounter  Insurance verification completed.   The patient is insured through [employer] [place] MEDICAID   Ran test claim for VEOZAH . Currently a quantity of 30 is a 30 day supply and the co-pay is 4.00 .       This test claim was processed through [facility]- copay amounts may vary at other pharmacies due to pharmacy/plan contracts, or as the patient moves through the different stages of their insurance plan.

## 2024-05-31 NOTE — Telephone Encounter (Signed)
 Pharmacy Patient Advocate Encounter  Received notification from [facility] MEDICAID that Prior Authorization for Veozah  45MG  tablets  has been APPROVED from 05/31/24 to 05/31/25

## 2024-05-31 NOTE — Telephone Encounter (Signed)
 Pharmacy Patient Advocate Encounter   Received notification from Physician's Office that prior authorization for Veozah  45MG  tablets is required/requested.   Insurance verification completed.   The patient is insured through [facility] MEDICAID.   Per test claim: PA required; PA submitted to above mentioned insurance via Latent Key/confirmation #/EOC [id number] Status is pending

## 2024-06-05 ENCOUNTER — Inpatient Hospital Stay: Admission: RE | Admit: 2024-06-05 | Payer: MEDICAID | Source: Ambulatory Visit

## 2024-06-26 ENCOUNTER — Other Ambulatory Visit (HOSPITAL_COMMUNITY): Payer: Self-pay | Admitting: Psychiatry

## 2024-06-26 ENCOUNTER — Ambulatory Visit (INDEPENDENT_AMBULATORY_CARE_PROVIDER_SITE_OTHER): Payer: MEDICAID | Admitting: Mental Health

## 2024-06-26 DIAGNOSIS — F259 Schizoaffective disorder, unspecified: Secondary | ICD-10-CM | POA: Diagnosis not present

## 2024-06-26 DIAGNOSIS — F4310 Post-traumatic stress disorder, unspecified: Secondary | ICD-10-CM | POA: Diagnosis not present

## 2024-06-26 DIAGNOSIS — F411 Generalized anxiety disorder: Secondary | ICD-10-CM

## 2024-06-26 DIAGNOSIS — F4000 Agoraphobia, unspecified: Secondary | ICD-10-CM

## 2024-06-26 NOTE — Progress Notes (Signed)
° °  THERAPIST PROGRESS NOTE Virtual Visit via Video Note  I connected with Tracy [name] on 06/26/2024 at  9:00 AM EST by a video enabled telemedicine application and verified that I am speaking with the correct person using two identifiers.  Location: Patient: home address on file Provider: remote office   I discussed the limitations of evaluation and management by telemedicine and the availability of in person appointments. The patient expressed understanding and agreed to proceed.  I discussed the assessment and treatment plan with the patient. The patient was provided an opportunity to ask questions and all were answered. The patient agreed with the plan and demonstrated an understanding of the instructions.   The patient was advised to call back or seek an in-person evaluation if the symptoms worsen or if the condition fails to improve as anticipated.  I provided 50 minutes of non-face-to-face time during this encounter.   Tracy [name], [facility]   Session Time: 9:06 am (   Participation Level: Active  Behavioral Response: Casual and NeatAlertEuthymic  Type of Therapy: Individual Therapy  Treatment Goals addressed: STG: Learn to control my feelings,anger. Tracy [name] will increase stability in moods AEB development of x 3 effective emotional regulation skills with no more than x 1 emotional outburst weekly within the next 90 days.    Love myself better. Tracy [name] will increase feelings of worth AEB engagement in self-care weekly with ability to establish healthy boundaries and assertive communication style within the next 90 days.   ProgressTowards Goals: Progressing  Interventions: CBT and Supportive  Summary: Tracy [name] is a 58 y.o. female who presents with dx of PTSD, schizoaffective disorder bipolar type and generalized anxiety disorder. Tracy [name] presents for session alert and oriented mood and affect adequate, stable. Notes chief complaint  I am good  but  shares increase in feelings of dread with concern for something bad to occur. Explores thoughts with therapist and able to process in balance manner and explore trigger for anxious thoughts. Notes desire to increase self-care and would like to engage in coloring. Notes feelings of loneliness at times and agrees to increase communication with family and express concerns and needs. Denies safety concerns.   Suicidal/Homicidal: Nowithout intent/plan  Therapist Response: Therapist engaged Tracy [name] in therapy session. Assessed for confidential space and current location for tele-session. Provided safe space to share thoughts and feelings and current level of functioning. Active empathic listening; validated feelings and providing support. Explored factors contributing to increase level of anxious feeling of dread and supported in challenging maladaptive thinking patterns. Explores factors that have contributed to progress and explored ongoing progress with goals in managing anger and increasing self-care and effective communication.   Plan: Return again in  x 4 weeks.  Diagnosis: PTSD (post-traumatic stress disorder)  Schizoaffective disorder, unspecified type (HCC)  Collaboration of Care: Other None  Patient/Guardian was advised Release of Information must be obtained prior to any record release in order to collaborate their care with an outside provider. Patient/Guardian was advised if they have not already done so to contact the registration department to sign all necessary forms in order for us  to release information regarding their care.   Consent: Patient/Guardian gives verbal consent for treatment and assignment of benefits for services provided during this visit. Patient/Guardian expressed understanding and agreed to proceed.   Tracy Tracy [name], [facility] 06/26/2024

## 2024-06-30 ENCOUNTER — Ambulatory Visit: Payer: MEDICAID | Admitting: Family Medicine

## 2024-06-30 ENCOUNTER — Encounter: Payer: Self-pay | Admitting: Family Medicine

## 2024-06-30 VITALS — BP 118/70 | HR 77 | Temp 97.2°F | Ht 67.0 in | Wt 203.8 lb

## 2024-06-30 DIAGNOSIS — N951 Menopausal and female climacteric states: Secondary | ICD-10-CM

## 2024-06-30 DIAGNOSIS — I10 Essential (primary) hypertension: Secondary | ICD-10-CM | POA: Diagnosis not present

## 2024-06-30 NOTE — Progress Notes (Signed)
 " [facility] [address] [place] [address] Dept: [phone] Dept Fax: [phone]  Chronic Care Office Visit  Subjective:    Patient ID: Tracy [name], female    DOB: [birth year], 58 y.o..   MRN: [id number]  Chief Complaint  Patient presents with   Hypertension    4 week f/u HTN.     History of Present Illness:  Patient is in today for reassessment of chronic medical conditions.   Ms. Newhard notes a history of hypertension. At her initial visit with me, I started her on amlodipine -valsartan  (Exforge ) 5-160 mg daily. She is tolerating this without difficulty.   Ms. Carbonell has a history of hot flashes. She cannot take estrogen due to having had a prior pulmonary embolism. She had failed prior therapy with an SSRI. At her last visit, we started her on fezolinetant  45 mg at bedtime. She notes her hot flashes are dramatically improved with this.  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   Chronic anticoagulation 03/05/2023   Chronic kidney disease, stage III (moderate) (HCC) 12/23/2022   Degenerative disc disease at L5-S1 level 07/27/2018   Conversion disorder    Chronic headaches 12/29/2016   HIV disease (HCC) 10/03/2015   Schizoaffective disorder (HCC) 10/03/2015   Chronic pain syndrome 10/03/2015   Tobacco use disorder 10/03/2015   Past Surgical History:  Procedure Laterality Date   ABDOMINAL EXPOSURE N/A 07/27/2018   Procedure: ABDOMINAL EXPOSURE;  Surgeon: Serene Gaile ORN, MD;  Location: [facility]  OR;  Service: Vascular;  Laterality: N/A;   ABDOMINAL HYSTERECTOMY     ANTERIOR LUMBAR FUSION N/A 07/27/2018   Procedure: ANTERIOR LUMBAR FUSION L5-S1;  Surgeon: Burnetta Aures, MD;  Location: [facility] OR;  Service: Orthopedics;  Laterality: N/A;  3 hrs/ Dr. Serene to do approach HIV Positive   BREAST EXCISIONAL BIOPSY Right 1990's   TRANSFORAMINAL LUMBAR INTERBODY FUSION (TLIF) WITH PEDICLE SCREW FIXATION 1 LEVEL N/A 10/02/2021   Procedure: TRANSFORAMINAL LUMBAR INTERBODY FUSION LUMBAR FOUR THROUGH FIVE;  Surgeon: Burnetta Aures, MD;  Location: [facility] OR;  Service: Orthopedics;  Laterality: N/A;   Family History  Problem Relation Age of Onset   Diabetes Mother    Hypertension Mother    Tremor Mother    Diabetes Sister    High blood pressure Sister    Stroke Maternal Aunt    Breast cancer Maternal Aunt    Colon cancer Maternal Aunt    Cerebral aneurysm Maternal Aunt    High blood pressure Maternal Uncle    Diabetes Maternal Uncle    Rectal cancer Neg Hx    Esophageal cancer Neg Hx    Stomach cancer Neg Hx    Outpatient Medications Prior to Visit  Medication Sig Dispense Refill   amLODipine -valsartan  (EXFORGE ) 5-160 MG tablet Take 1 tablet by mouth daily. 30 tablet 11   Blood Pressure KIT Measure home blood rpessure daily 1 kit 0   Cyanocobalamin (B-12) 50 MCG TABS      dolutegravir -rilpivirine  (JULUCA ) 50-25 MG tablet Take 1 tablet by mouth daily before lunch. 30 tablet 5   Fezolinetant  45 MG TABS Take  1 tablet (45 mg total) by mouth daily at 10 pm. 30 tablet 11   hydrOXYzine  (ATARAX ) 25 MG tablet TAKE 1 Tablet BY MOUTH TWICE DAILY AS NEEDED 60 tablet 1   lamoTRIgine  (LAMICTAL ) 100 MG tablet Take 1 tablet (100 mg total) by mouth daily. 90 tablet 0   QUEtiapine  (SEROQUEL  XR) 300 MG 24 hr tablet Take 2 tablets (600 mg total) by mouth at bedtime. 60 tablet 2   rivaroxaban  (XARELTO ) 20 MG TABS tablet Take 20 mg by mouth at bedtime.     sertraline  (ZOLOFT ) 100 MG tablet Take 1 tablet (100 mg  total) by mouth daily. 90 tablet 0   No facility-administered medications prior to visit.   Allergies[1]   Objective:   Today's Vitals   06/30/24 1339  BP: 118/70  Pulse: 77  Temp: (!) 97.2 F (36.2 C)  TempSrc: Temporal  SpO2: 97%  Weight: 203 lb 12.8 oz (92.4 kg)  Height: 5' 7 (1.702 m)   There is no height or weight on file to calculate BMI.   General: Well developed, well nourished. No acute distress. Psych: Alert and oriented. Normal mood and affect.  Health Maintenance Due  Topic Date Due   DTaP/Tdap/Td (2 - Td or Tdap) 11/08/2023   Lab Results    Latest Ref Rng & Units 05/29/2024    2:21 PM 03/09/2024   11:21 AM 01/05/2024   12:28 PM  CMP  Glucose 70 - 99 mg/dL 98  84  95   BUN 6 - 23 mg/dL 21  14  12    Creatinine 0.40 - 1.20 mg/dL 9.03  8.83  8.92   Sodium 135 - 145 mEq/L 139  142  141   Potassium 3.5 - 5.1 mEq/L 3.7  4.0  3.8   Chloride 96 - 112 mEq/L 100  108  109   CO2 19 - 32 mEq/L 27  25  21    Calcium 8.4 - 10.5 mg/dL 9.9  9.4  9.6   Total Protein 6.0 - 8.3 g/dL 7.7  6.9  7.2   Total Bilirubin 0.2 - 1.2 mg/dL 0.7  0.7  0.8   Alkaline Phos 39 - 117 U/L 67   61   AST 0 - 37 U/L 15  12  17    ALT 0 - 35 U/L 13  10  14     Last lipids Lab Results  Component Value Date   CHOL 226 (H) 05/29/2024   HDL 105.40 05/29/2024   LDLCALC 97 05/29/2024   TRIG 119.0 05/29/2024   CHOLHDL 2 05/29/2024      Assessment & Plan:   Problem List Items Addressed This Visit       Cardiovascular and Mediastinum   Essential hypertension - Primary   Blood pressure is now at goal. Continue amlodipine -valsartan  (Exforge ) 5-160 mg daily.         Other   Vasomotor symptoms due to menopause   Much improved on fezolinetant  (Veozah ) 45 mg daily.  We will recheck LFTs at her next visit.       Return in about 3 months (around 09/28/2024) for Reassessment.   Garnette CHRISTELLA Simpler, MD  I,Emily Lagle,acting as a scribe for Garnette CHRISTELLA Simpler, MD.,have documented all relevant  documentation on the behalf of Garnette CHRISTELLA Simpler, MD.  I, Garnette CHRISTELLA Simpler, MD, have reviewed all documentation for this visit. The documentation on 06/30/2024 for the exam, diagnosis, procedures, and orders are all accurate and complete.     [1]  Allergies  Allergen Reactions   Tetracycline Rash   Glyceryl Stear-Peg 100 Stear Other (See Comments)   Porcine (Pork) Protein-Containing Drug Products Other (See Comments)    Religious practice   Cyclobenzaprine Rash and Itching   Moxifloxacin Rash   Tetracyclines & Related Itching and Rash   Tramadol Itching and Rash   "

## 2024-06-30 NOTE — Assessment & Plan Note (Addendum)
 Blood pressure is now at goal. Continue amlodipine -valsartan  (Exforge ) 5-160 mg daily.

## 2024-06-30 NOTE — Assessment & Plan Note (Signed)
 Much improved on fezolinetant  (Veozah ) 45 mg daily.  We will recheck LFTs at her next visit.

## 2024-07-17 NOTE — Progress Notes (Signed)
 BH MD Outpatient Progress Note Televisit via video: I connected with Tracy [name] on 1/16 at  8:00 AM EST by a video enabled telemedicine application and verified that I am speaking with the correct person using two identifiers.  Location: Patient: home Provider: office   I discussed the limitations of evaluation and management by telemedicine and the availability of in person appointments. The patient expressed understanding and agreed to proceed.  I discussed the assessment and treatment plan with the patient. The patient was provided an opportunity to ask questions and all were answered. The patient agreed with the plan and demonstrated an understanding of the instructions.   The patient was advised to call back or seek an in-person evaluation if the symptoms worsen or if the condition fails to improve as anticipated.  07/28/2024 8:02 AM Tracy [name]  MRN:  [id number]  Assessment:  Tracy [name] presents for follow-up evaluation. Since the last visit, the patient reports worsening anxiety with increased fears that others are trying to harm her, while depressive symptoms and mood stability remain improved on the current regimen. She endorses clear benefit from psychotherapy, particularly when processing these worries, and agreed to increase therapy frequency. She denies manic symptoms. No acute safety concerns identified today.  Overall presentation remains most consistent with an anxiety disorder with prominent paranoid ideation, occurring in the context of otherwise stable mood symptoms. Current psychotropic medications are effective for depression and mood stabilization and are appropriate to continue at current doses. Hydroxyzine  is appropriate to continue at the current dose, and patient was counseled that she may use it up to three times daily as needed for anxiety (she was only taking two times a day as needed). Otherwise, no medication changes made today given reported benefit and  absence of mania or mood destabilization. Antipsychotic monitoring labs were ordered as part of routine safety surveillance. Will reassess anxiety severity and paranoid ideation at next visit and consider medication adjustment if symptoms persist or worsen despite increased therapy.  Plan:  #Unspecified mood disorder (Bipolar vs schizoaffective) Interventions:  -- Continue Zoloft  100 mg daily -- Continue Seroquel  600 mg nightly  A1c: 5.5 Lipid panel: chol 201 TSH WNL CBC WNL CMP elevated Cr (has CKD) Labs updated on 08/2023, ordered updated labs on 07/28/2024  EKG: Qtc is 397 on 12/2023 AIMS exam on 03/10/2024 normal -- Continue Lamictal  100 mg daily   #GAD #Agoraphobia # PTSD Interventions: -- Zoloft  as above - Continue hydroxyzine  25 mg TID as needed for anxiety -- Sees Bernice Rao for therapy, about monthly-  plan to increase frequency due to increased paranoid ideation and pt noting strong benefit with sessions   # Binge eating disorder Interventions: wellbutrin  -- Recommend nutritionist   Patient was given contact information for behavioral health clinic and was instructed to call 911 for emergencies.   Identifying Information: Tracy [name] is a 59 y.o. female with a history of unspecified mood disorder (Bipolar vs schizoaffective) who is an established patient with [facility] for management of depression and anxiety  Subjective:  Chief Complaint: Med mgmt  Interval History:   Patient reports feeling good today. Patient reports increased anxiety with more frequent use of her prescribed anxiety medication. She reports persistent worries that she is about to be hurt, occurring every morning and night, and states these symptoms worsened approximately two weeks ago without an identifiable trigger. She reports difficulty trusting others at this time. She reports that taking hydroxyzine  helps her sleep and reduces anxiety symptoms. She  reports discussing  these concerns with her therapist and states the session was very helpful. She reports plans to increase the frequency of therapy visits as she is currently seeing her therapist only monthly at this time. This dino discussed the option of taking hydroxyzine  up to three times daily as needed for anxiety. Otherwise, she reports feeling like her other medications are helpful for her depression. Denies symptoms of mania.   Patient reports good sleep and reports good appetite. Patient reports no current suicidal ideation, homicidal ideation, or auditory or visual hallucinations. Patient reports tobacco use with smoking decreased to one cigarette daily and states she has smoked since age 8.  Past Psychiatric History:  Diagnoses: Depression, schizophrenia  Medication trials: Prozac (did not like like it- reports having increased suicidal thoughts), Paxil, Trazodone (did not feel like it worked) Current: Seroquel , Wellbutrin , Lamictal  Previous psychiatrist/therapist: Unsure when last time she saw one, thinks it was about one year ago; denies seeing a therapist but plan for a first appointment in March Hospitalizations: Yes, about 11 times, first time was 1990s due to suicide attempt and most recently 3 years ago due to SI Suicide attempts: 4-5 times- through overdosing; most recently thre years ago SIB: denies Hx of violence towards others: yes, since 59 years old- used to fight Current access to guns: Denies   Substance use: Alcohol: on special occasions like birthdays and holidays- 2 margaritas at a time, never goes over 3 Illicit drugs: clean since 2007, relapsed, clean for 3 years Previously used crack, daily 18 years ago  Family Psychiatric History: sisters-unsure but states theres mental health problems   Social History:   Living: [employer], 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 School: 10th grade Job: no, on disability since the 90's for mental health Married/Children: married  since 2008, one child Support: husband, daughter (lives 7 minutes away) Legal History: yes, assault with a deadly weapon - was in prison for 24 months, three times  Past Medical History:  Past Medical History:  Diagnosis Date   Allergy    Anxiety    Arthritis    Chronic back pain    Chronic headaches    Chronic kidney disease    Depression    HIV infection (HCC)    Joint pain    Localized swelling of both lower legs    Legs, feet and hands   Pre-diabetes    Pulmonary embolism (HCC)    on Xarelto    Schizoaffective disorder (HCC)    Seasonal allergies    Stroke (cerebrum) (HCC)    patient denies, states it was a heat stroke   Weight loss     Past Surgical History:  Procedure Laterality Date   ABDOMINAL EXPOSURE N/A 07/27/2018   Procedure: ABDOMINAL EXPOSURE;  Surgeon: Serene Gaile ORN, MD;  Location: [facility] OR;  Service: Vascular;  Laterality: N/A;   ABDOMINAL HYSTERECTOMY     ANTERIOR LUMBAR FUSION N/A 07/27/2018   Procedure: ANTERIOR LUMBAR FUSION L5-S1;  Surgeon: Burnetta Aures, MD;  Location: [facility] OR;  Service: Orthopedics;  Laterality: N/A;  3 hrs/ Dr. Serene to do approach HIV Positive   BREAST EXCISIONAL BIOPSY Right 1990's   TRANSFORAMINAL LUMBAR INTERBODY FUSION (TLIF) WITH PEDICLE SCREW FIXATION 1 LEVEL N/A 10/02/2021   Procedure: TRANSFORAMINAL LUMBAR INTERBODY FUSION LUMBAR FOUR THROUGH FIVE;  Surgeon: Burnetta Aures, MD;  Location: [facility] OR;  Service: Orthopedics;  Laterality: N/A;   Family History:  Family History  Problem Relation Age of Onset   Diabetes Mother  Hypertension Mother    Tremor Mother    Diabetes Sister    High blood pressure Sister    Stroke Maternal Aunt    Breast cancer Maternal Aunt    Colon cancer Maternal Aunt    Cerebral aneurysm Maternal Aunt    High blood pressure Maternal Uncle    Diabetes Maternal Uncle    Rectal cancer Neg Hx    Esophageal cancer Neg Hx    Stomach cancer Neg Hx      Social History   Socioeconomic History    Marital status: Married    Spouse name: Not on file   Number of children: 1   Years of education: 10   Highest education level: Not on file  Occupational History   Occupation: NA   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  Tobacco Use   Smoking status: Some Days    Current packs/day: 0.00    Average packs/day: 0.3 packs/day for 15.0 years (3.8 ttl pk-yrs)    Types: Cigarettes    Last attempt to quit: 04/26/2016    Years since quitting: 8.2   Smokeless tobacco: Never   Tobacco comments:    2 packs per week   Vaping Use   Vaping status: Former  Substance and Sexual Activity   Alcohol use: Yes    Alcohol/week: 4.0 standard drinks of alcohol    Types: 4 Standard drinks or equivalent per week    Comment: quit 12/2005   Drug use: Not Currently    Types: Crack cocaine    Comment: Previous drug use, quit 2004-05; relapsed 05/2018, clean since 06/08/2018   Sexual activity: Yes    Partners: Male    Birth control/protection: None  Other Topics Concern   Not on file  Social History Narrative   Lives at home w/ her husband   Caffeine: 1/2 c coffee per day   Right handed   Lives with husband    One floor home   Social Drivers of Health   Tobacco Use: High Risk (06/30/2024)   Patient History    Smoking Tobacco Use: Some Days    Smokeless Tobacco Use: Never    Passive Exposure: Not on file  Financial Resource Strain: Medium Risk (05/25/2024)   Overall Financial Resource Strain (CARDIA)    Difficulty of Paying Living Expenses: Somewhat hard  Food Insecurity: Food Insecurity Present (05/25/2024)   Epic    Worried About Programme Researcher, Broadcasting/film/video in the Last Year: Sometimes true    Ran Out of Food in the Last Year: Sometimes true  Transportation Needs: No Transportation Needs (05/25/2024)   Epic    Lack of Transportation (Medical): No    Lack of Transportation (Non-Medical): No  Physical Activity: Insufficiently Active (05/25/2024)   Exercise Vital Sign    Days of Exercise  per Week: 4 days    Minutes of Exercise per Session: 20 min  Stress: Stress Concern Present (05/25/2024)   [employer] of Occupational Health - Occupational Stress Questionnaire    Feeling of Stress: To some extent  Social Connections: Socially Integrated (05/25/2024)   Social Connection and Isolation Panel    Frequency of Communication with Friends and Family: Once a week    Frequency of Social Gatherings with Friends and Family: Twice a week    Attends Religious Services: More than 4 times per year    Active Member of [employer] or Organizations: Yes    Attends Banker Meetings: Patient declined    Marital  Status: Married  Depression (PHQ2-9): Medium Risk (05/29/2024)   Depression (PHQ2-9)    PHQ-2 Score: 8  Alcohol Screen: Low Risk (05/25/2024)   Alcohol Screen    Last Alcohol Screening Score (AUDIT): 2  Housing: Unknown (05/25/2024)   Epic    Unable to Pay for Housing in the Last Year: No    Number of Times Moved in the Last Year: Not on file    Homeless in the Last Year: No  Utilities: Low Risk (04/29/2023)   Received from [facility]   Utilities    Within the past 12 months, have you been unable to get utilities(heat, electricity) when it was really needed?: No  Health Literacy: Low Risk (04/29/2023)   Received from [facility] Literacy    How often do you need to have someone help you when you read instructions, pamphlets, or other written material from your doctor or pharmacy?: Never    Allergies:  Allergies  Allergen Reactions   Tetracycline Rash   Glyceryl Stear-Peg 100 Stear Other (See Comments)   Porcine (Pork) Protein-Containing Drug Products Other (See Comments)    Religious practice   Cyclobenzaprine Rash and Itching   Moxifloxacin Rash   Tetracyclines & Related Itching and Rash   Tramadol Itching and Rash    Current Medications: Current Outpatient Medications  Medication Sig Dispense Refill   amLODipine -valsartan   (EXFORGE ) 5-160 MG tablet Take 1 tablet by mouth daily. 30 tablet 11   Blood Pressure KIT Measure home blood rpessure daily 1 kit 0   Cyanocobalamin (B-12) 50 MCG TABS      dolutegravir -rilpivirine  (JULUCA ) 50-25 MG tablet Take 1 tablet by mouth daily before lunch. 30 tablet 5   Fezolinetant  45 MG TABS Take 1 tablet (45 mg total) by mouth daily at 10 pm. 30 tablet 11   hydrOXYzine  (ATARAX ) 25 MG tablet TAKE 1 Tablet BY MOUTH TWICE DAILY AS NEEDED 60 tablet 1   lamoTRIgine  (LAMICTAL ) 100 MG tablet Take 1 tablet (100 mg total) by mouth daily. 90 tablet 0   QUEtiapine  (SEROQUEL  XR) 300 MG 24 hr tablet Take 2 tablets (600 mg total) by mouth at bedtime. 60 tablet 2   rivaroxaban  (XARELTO ) 20 MG TABS tablet Take 20 mg by mouth at bedtime.     sertraline  (ZOLOFT ) 100 MG tablet Take 1 tablet (100 mg total) by mouth daily. 90 tablet 0   No current facility-administered medications for this visit.     Objective:  Psychiatric Specialty Exam: General Appearance: appears at stated age, casually dressed and groomed   Behavior: pleasant and cooperative   Psychomotor Activity: no psychomotor agitation or retardation noted   Eye Contact: fair  Speech: normal amount, volume and fluency    Mood: anxious  Affect: congruent  Thought Process: linear, goal directed, no circumstantial or tangential thought process noted, no racing thoughts or flight of ideas  Descriptions of Associations: intact   Thought Content Hallucinations: denies AH, VH , does not appear responding to stimuli  Delusions: some paranoid ideation Suicidal Thoughts: denies SI, intention, plan  Homicidal Thoughts: denies HI, intention, plan   Alertness/Orientation: alert and fully oriented   Insight: fair Judgment: fair  Memory: intact   Executive Functions  Concentration: intact  Attention Span: fair  Recall: intact  Fund of Knowledge: fair   Physical Exam  General: Pleasant, well-appearing. No acute  distress. Pulmonary: Normal effort. No wheezing or rales. Neuro: A&Ox3.No focal deficit.  Review of Systems  No reported symptoms  Metabolic Disorder Labs: Lab Results  Component Value Date   HGBA1C 5.2 06/21/2018   MPG 102.54 06/21/2018   MPG 108 12/30/2016   Lab Results  Component Value Date   PROLACTIN 127.7 (H) 06/22/2018   Lab Results  Component Value Date   CHOL 226 (H) 05/29/2024   TRIG 119.0 05/29/2024   HDL 105.40 05/29/2024   CHOLHDL 2 05/29/2024   VLDL 23.8 05/29/2024   LDLCALC 97 05/29/2024   LDLCALC 117 (H) 05/22/2022   Lab Results  Component Value Date   TSH 0.656 12/19/2021   TSH 3.904 06/21/2018    Therapeutic Level Labs: No results found for: LITHIUM No results found for: VALPROATE No results found for: CBMZ  Screenings:  AUDIT    Flowsheet Row [facility]  Alcohol Use Disorder Identification Test Final Score (AUDIT) 0   CAGE-AID    Flowsheet Row ED to Hosp-Admission (Discharged) from 12/28/2022 in [place] [facility] [place] [facility]  CAGE-AID Score 0   GAD-7    Flowsheet Row Office Visit from 05/29/2024 in [facility] [facility] Visit from 03/09/2024 in [facility] [place] Counselor from 01/27/2024 in [facility]  Total GAD-7 Score 14 19 19    PHQ2-9    Flowsheet Row Office Visit from 05/29/2024 in [facility] Visit from 03/09/2024 in [facility] [place]. [facility] Counselor from 01/27/2024 in [facility] Clinical Support from 11/03/2023 in [facility] Office Visit from 10/19/2023 in [facility] Of [place]. [facility]  PHQ-2 Total Score 4 2 6 3  0  PHQ-9 Total Score 8 8 23  13  --   Flowsheet Row Counselor from 01/27/2024 in [facility] ED from 01/05/2024 in [facility] Emergency Department at [facility] ED to Hosp-Admission (Discharged) from 12/28/2022 in [place] [facility]  C-SSRS RISK CATEGORY No Risk No Risk No Risk    Ismael Franco, MD PGY-3 Psychiatry Resident

## 2024-07-18 NOTE — Progress Notes (Signed)
 The ASCVD Risk score (Arnett DK, et al., 2019) failed to calculate for the following reasons:   Risk score cannot be calculated because patient has a medical history suggesting prior/existing ASCVD   * - Cholesterol units were assumed  Tracy [name], BSN, [facility]

## 2024-07-21 ENCOUNTER — Ambulatory Visit (INDEPENDENT_AMBULATORY_CARE_PROVIDER_SITE_OTHER): Payer: MEDICAID | Admitting: Mental Health

## 2024-07-21 DIAGNOSIS — F259 Schizoaffective disorder, unspecified: Secondary | ICD-10-CM | POA: Diagnosis not present

## 2024-07-21 DIAGNOSIS — F411 Generalized anxiety disorder: Secondary | ICD-10-CM | POA: Diagnosis not present

## 2024-07-21 DIAGNOSIS — F4000 Agoraphobia, unspecified: Secondary | ICD-10-CM | POA: Diagnosis not present

## 2024-07-21 NOTE — Progress Notes (Unsigned)
" ° °  THERAPIST PROGRESS NOTE Virtual Visit via Video Note  I connected with [name] on 07/21/2024 at  9:00 AM EST by a video enabled telemedicine application and verified that I am speaking with the correct person using two identifiers.  Location: Patient: Clinic- [facility] Provider: remote office   I discussed the limitations of evaluation and management by telemedicine and the availability of in person appointments. The patient expressed understanding and agreed to proceed.  I discussed the assessment and treatment plan with the patient. The patient was provided an opportunity to ask questions and all were answered. The patient agreed with the plan and demonstrated an understanding of the instructions.   The patient was advised to call back or seek an in-person evaluation if the symptoms worsen or if the condition fails to improve as anticipated.  I provided 33 minutes of non-face-to-face time during this encounter.   Ty Bernice Savant, [facility]   Session Time: 9:07am (  Participation Level: Active  Behavioral Response: CasualAlertWNL  Type of Therapy: Individual Therapy  Treatment Goals addressed: STG: Learn to control my feelings,anger. Agnes will increase stability in moods AEB development of x 3 effective emotional regulation skills with no more than x 1 emotional outburst weekly within the next 90 days.    Love myself better. Ananiah will increase feelings of worth AEB engagement in self-care weekly with ability to establish healthy boundaries and assertive communication style within the next 90 days.   ProgressTowards Goals: Progressing  Interventions: CBT and Supportive  Summary:  Patryce Depriest is a 59 y.o. female who presents with dx of PTSD, schizoaffective disorder bipolar type and generalized anxiety disorder. [name] presents for session alert and oriented mood and affect adequate, stable. Notes chief complaint  I been anxious more, like  something bad about to happen. Shares has been taking medications however anxiety has been increased. Difficulty identifying trigger. Shares with therapist situation of supporting family member finanially and now having to refocusing on building savings. Shares ability to set boundary with family and explores factors that have contributing to difficulty in setting boundaries in the past. Agrees to work to cotinue to identify and set boundaries that are important to her. Denies safety concerns.  Suicidal/Homicidal: Nowithout intent/plan  Therapist Response: Therapist engaged [place] in therapy session. Assessed for confidential space and current location for tele-session. Provided safe space to share thoughts and feelings and current level of functioning. Active empathic listening; validated feelings and providing support. Explored factors contributing to anxious feelings. Explored situation with family member and provided education on boundary setting and assertive communication skills.  Encouraged working to identify boundaries that important to her. Reviewed session and provided follow up  Plan: Return again in  x 4 weeks.  Diagnosis: GAD (generalized anxiety disorder)  Agoraphobia  Schizoaffective disorder, unspecified type (HCC)  Collaboration of Care: Other None  Patient/Guardian was advised Release of Information must be obtained prior to any record release in order to collaborate their care with an outside provider. Patient/Guardian was advised if they have not already done so to contact the registration department to sign all necessary forms in order for us  to release information regarding their care.   Consent: Patient/Guardian gives verbal consent for treatment and assignment of benefits for services provided during this visit. Patient/Guardian expressed understanding and agreed to proceed.   Ty Bernice [place], [facility] 07/21/2024  "

## 2024-07-28 ENCOUNTER — Telehealth (HOSPITAL_COMMUNITY): Payer: MEDICAID | Admitting: Psychiatry

## 2024-07-28 DIAGNOSIS — Z5181 Encounter for therapeutic drug level monitoring: Secondary | ICD-10-CM

## 2024-07-28 DIAGNOSIS — F411 Generalized anxiety disorder: Secondary | ICD-10-CM | POA: Diagnosis not present

## 2024-07-28 DIAGNOSIS — F250 Schizoaffective disorder, bipolar type: Secondary | ICD-10-CM

## 2024-07-28 DIAGNOSIS — F4310 Post-traumatic stress disorder, unspecified: Secondary | ICD-10-CM

## 2024-07-28 DIAGNOSIS — F50819 Binge eating disorder, unspecified: Secondary | ICD-10-CM | POA: Diagnosis not present

## 2024-07-28 DIAGNOSIS — F333 Major depressive disorder, recurrent, severe with psychotic symptoms: Secondary | ICD-10-CM

## 2024-07-28 DIAGNOSIS — F39 Unspecified mood [affective] disorder: Secondary | ICD-10-CM

## 2024-07-28 DIAGNOSIS — F4000 Agoraphobia, unspecified: Secondary | ICD-10-CM

## 2024-07-28 MED ORDER — QUETIAPINE FUMARATE ER 300 MG PO TB24: 600.0000 mg | ORAL_TABLET | Freq: Every day | ORAL | 2 refills | Status: AC

## 2024-07-28 MED ORDER — LAMOTRIGINE 100 MG PO TABS: 100.0000 mg | ORAL_TABLET | Freq: Every day | ORAL | 0 refills | Status: AC

## 2024-07-28 MED ORDER — HYDROXYZINE HCL 25 MG PO TABS: 25.0000 mg | ORAL_TABLET | Freq: Three times a day (TID) | ORAL | 2 refills | Status: AC | PRN

## 2024-07-28 MED ORDER — SERTRALINE HCL 100 MG PO TABS: 100.0000 mg | ORAL_TABLET | Freq: Every day | ORAL | 0 refills | Status: AC

## 2024-08-02 ENCOUNTER — Other Ambulatory Visit (HOSPITAL_COMMUNITY): Payer: MEDICAID

## 2024-08-02 DIAGNOSIS — Z5181 Encounter for therapeutic drug level monitoring: Secondary | ICD-10-CM

## 2024-08-02 NOTE — Progress Notes (Signed)
 Patient presented to the office for labs, labs were drawn from right hand with no issue or complaints . Pt left office alert and ambulatory.

## 2024-08-05 LAB — CBC WITH DIFFERENTIAL/PLATELET
Basophils Absolute: 0 10*3/uL (ref 0.0–0.2)
Basos: 1 %
EOS (ABSOLUTE): 0.1 10*3/uL (ref 0.0–0.4)
Eos: 1 %
Hematocrit: 40.4 % (ref 34.0–46.6)
Hemoglobin: 13.3 g/dL (ref 11.1–15.9)
Immature Grans (Abs): 0 10*3/uL (ref 0.0–0.1)
Immature Granulocytes: 0 %
Lymphocytes Absolute: 1.8 10*3/uL (ref 0.7–3.1)
Lymphs: 39 %
MCH: 30.6 pg (ref 26.6–33.0)
MCHC: 32.9 g/dL (ref 31.5–35.7)
MCV: 93 fL (ref 79–97)
Monocytes Absolute: 0.5 10*3/uL (ref 0.1–0.9)
Monocytes: 10 %
Neutrophils Absolute: 2.3 10*3/uL (ref 1.4–7.0)
Neutrophils: 48 %
Platelets: 243 10*3/uL (ref 150–450)
RBC: 4.34 x10E6/uL (ref 3.77–5.28)
RDW: 12.9 % (ref 11.7–15.4)
WBC: 4.7 10*3/uL (ref 3.4–10.8)

## 2024-08-05 LAB — LIPID PANEL
Chol/HDL Ratio: 2.3 ratio (ref 0.0–4.4)
Cholesterol, Total: 203 mg/dL — ABNORMAL HIGH (ref 100–199)
HDL: 87 mg/dL
LDL Chol Calc (NIH): 102 mg/dL — ABNORMAL HIGH (ref 0–99)
Triglycerides: 81 mg/dL (ref 0–149)
VLDL Cholesterol Cal: 14 mg/dL (ref 5–40)

## 2024-08-05 LAB — COMPREHENSIVE METABOLIC PANEL WITH GFR
ALT: 12 [IU]/L (ref 0–32)
AST: 20 [IU]/L (ref 0–40)
Albumin: 4.5 g/dL (ref 3.8–4.9)
Alkaline Phosphatase: 66 [IU]/L (ref 49–135)
BUN/Creatinine Ratio: 14 (ref 9–23)
BUN: 14 mg/dL (ref 6–24)
Bilirubin Total: 0.4 mg/dL (ref 0.0–1.2)
Calcium: 9.3 mg/dL (ref 8.7–10.2)
Chloride: 110 mmol/L — ABNORMAL HIGH (ref 96–106)
Creatinine, Ser: 0.97 mg/dL (ref 0.57–1.00)
Globulin, Total: 2.3 g/dL (ref 1.5–4.5)
Glucose: 78 mg/dL (ref 70–99)
Potassium: 4.4 mmol/L (ref 3.5–5.2)
Sodium: 144 mmol/L (ref 134–144)
Total Protein: 6.8 g/dL (ref 6.0–8.5)
eGFR: 68 mL/min/{1.73_m2}

## 2024-08-05 LAB — HEMOGLOBIN A1C
Est. average glucose Bld gHb Est-mCnc: 117 mg/dL
Hgb A1c MFr Bld: 5.7 % — ABNORMAL HIGH (ref 4.8–5.6)

## 2024-08-05 LAB — TSH: TSH: 1.04 u[IU]/mL (ref 0.450–4.500)

## 2024-08-07 ENCOUNTER — Other Ambulatory Visit: Payer: Self-pay

## 2024-08-07 ENCOUNTER — Emergency Department (HOSPITAL_COMMUNITY): Payer: MEDICAID

## 2024-08-07 ENCOUNTER — Encounter (HOSPITAL_COMMUNITY): Payer: Self-pay | Admitting: Emergency Medicine

## 2024-08-07 ENCOUNTER — Emergency Department (HOSPITAL_COMMUNITY)
Admission: EM | Admit: 2024-08-07 | Discharge: 2024-08-07 | Disposition: A | Discharge status: Home / Self Care | Payer: MEDICAID | Attending: Emergency Medicine | Admitting: Emergency Medicine

## 2024-08-07 DIAGNOSIS — Z8673 Personal history of transient ischemic attack (TIA), and cerebral infarction without residual deficits: Secondary | ICD-10-CM | POA: Diagnosis not present

## 2024-08-07 DIAGNOSIS — R1084 Generalized abdominal pain: Secondary | ICD-10-CM | POA: Diagnosis not present

## 2024-08-07 DIAGNOSIS — Z21 Asymptomatic human immunodeficiency virus [HIV] infection status: Secondary | ICD-10-CM | POA: Insufficient documentation

## 2024-08-07 DIAGNOSIS — K920 Hematemesis: Secondary | ICD-10-CM | POA: Diagnosis not present

## 2024-08-07 DIAGNOSIS — Z86711 Personal history of pulmonary embolism: Secondary | ICD-10-CM | POA: Insufficient documentation

## 2024-08-07 DIAGNOSIS — R1010 Upper abdominal pain, unspecified: Secondary | ICD-10-CM | POA: Diagnosis not present

## 2024-08-07 DIAGNOSIS — I129 Hypertensive chronic kidney disease with stage 1 through stage 4 chronic kidney disease, or unspecified chronic kidney disease: Secondary | ICD-10-CM | POA: Diagnosis not present

## 2024-08-07 DIAGNOSIS — F17210 Nicotine dependence, cigarettes, uncomplicated: Secondary | ICD-10-CM | POA: Insufficient documentation

## 2024-08-07 DIAGNOSIS — R112 Nausea with vomiting, unspecified: Secondary | ICD-10-CM | POA: Diagnosis present

## 2024-08-07 DIAGNOSIS — J069 Acute upper respiratory infection, unspecified: Secondary | ICD-10-CM | POA: Diagnosis not present

## 2024-08-07 DIAGNOSIS — Z7901 Long term (current) use of anticoagulants: Secondary | ICD-10-CM | POA: Insufficient documentation

## 2024-08-07 DIAGNOSIS — Z79899 Other long term (current) drug therapy: Secondary | ICD-10-CM | POA: Insufficient documentation

## 2024-08-07 DIAGNOSIS — N189 Chronic kidney disease, unspecified: Secondary | ICD-10-CM | POA: Diagnosis not present

## 2024-08-07 LAB — URINALYSIS, ROUTINE W REFLEX MICROSCOPIC
Bilirubin Urine: NEGATIVE
Glucose, UA: NEGATIVE mg/dL
Hgb urine dipstick: NEGATIVE
Ketones, ur: 5 mg/dL — AB
Nitrite: NEGATIVE
Protein, ur: 100 mg/dL — AB
Specific Gravity, Urine: 1.038 — ABNORMAL HIGH (ref 1.005–1.030)
pH: 5 (ref 5.0–8.0)

## 2024-08-07 LAB — TYPE AND SCREEN
ABO/RH(D): O POS
Antibody Screen: NEGATIVE

## 2024-08-07 LAB — CBC
HCT: 25.5 % — ABNORMAL LOW (ref 36.0–46.0)
Hemoglobin: 7.6 g/dL — ABNORMAL LOW (ref 12.0–15.0)
MCH: 30.8 pg (ref 26.0–34.0)
MCHC: 29.8 g/dL — ABNORMAL LOW (ref 30.0–36.0)
MCV: 103.2 fL — ABNORMAL HIGH (ref 80.0–100.0)
Platelets: 206 10*3/uL (ref 150–400)
RBC: 2.47 MIL/uL — ABNORMAL LOW (ref 3.87–5.11)
RDW: 13.1 % (ref 11.5–15.5)
WBC: 7.1 10*3/uL (ref 4.0–10.5)
nRBC: 0 % (ref 0.0–0.2)

## 2024-08-07 LAB — COMPREHENSIVE METABOLIC PANEL WITH GFR
ALT: 16 U/L (ref 0–44)
AST: 33 U/L (ref 15–41)
Albumin: 4.1 g/dL (ref 3.5–5.0)
Alkaline Phosphatase: 57 U/L (ref 38–126)
Anion gap: 13 (ref 5–15)
BUN: 8 mg/dL (ref 6–20)
CO2: 21 mmol/L — ABNORMAL LOW (ref 22–32)
Calcium: 8.7 mg/dL — ABNORMAL LOW (ref 8.9–10.3)
Chloride: 107 mmol/L (ref 98–111)
Creatinine, Ser: 0.92 mg/dL (ref 0.44–1.00)
GFR, Estimated: >60 mL/min
Glucose, Bld: 83 mg/dL (ref 70–99)
Potassium: 3.8 mmol/L (ref 3.5–5.1)
Sodium: 141 mmol/L (ref 135–145)
Total Bilirubin: 0.3 mg/dL (ref 0.0–1.2)
Total Protein: 6.7 g/dL (ref 6.5–8.1)

## 2024-08-07 LAB — RETICULOCYTES
Immature Retic Fract: 8.6 % (ref 2.3–15.9)
RBC.: 3.93 MIL/uL (ref 3.87–5.11)
Retic Count, Absolute: 36.9 10*3/uL (ref 19.0–186.0)
Retic Ct Pct: 0.9 % (ref 0.4–3.1)

## 2024-08-07 LAB — POC OCCULT BLOOD, ED: Fecal Occult Bld: NEGATIVE

## 2024-08-07 LAB — IRON AND TIBC
Iron: 18 ug/dL — ABNORMAL LOW (ref 28–170)
Saturation Ratios: 6 % — ABNORMAL LOW (ref 10.4–31.8)
TIBC: 286 ug/dL (ref 250–450)
UIBC: 267 ug/dL

## 2024-08-07 LAB — HEMOGLOBIN AND HEMATOCRIT, BLOOD
HCT: 36.2 % (ref 36.0–46.0)
Hemoglobin: 12.1 g/dL (ref 12.0–15.0)

## 2024-08-07 LAB — FOLATE: Folate: >20 ng/mL

## 2024-08-07 LAB — LIPASE, BLOOD: Lipase: 20 U/L (ref 11–51)

## 2024-08-07 LAB — FERRITIN: Ferritin: 250 ng/mL (ref 11–307)

## 2024-08-07 LAB — VITAMIN B12: Vitamin B-12: 2097 pg/mL — ABNORMAL HIGH (ref 180–914)

## 2024-08-07 MED ORDER — MORPHINE SULFATE (PF) 4 MG/ML IV SOLN
4.0000 mg | Freq: Once | INTRAVENOUS | Status: AC
  Administered 2024-08-07: 4 mg via INTRAVENOUS
  Filled 2024-08-07: qty 1

## 2024-08-07 MED ORDER — SODIUM CHLORIDE 0.9 % IV BOLUS
1000.0000 mL | Freq: Once | INTRAVENOUS | Status: AC
  Administered 2024-08-07: 1000 mL via INTRAVENOUS

## 2024-08-07 MED ORDER — SUCRALFATE 1 G PO TABS: 1.0000 g | ORAL_TABLET | Freq: Three times a day (TID) | ORAL | 0 refills | Status: AC | PRN

## 2024-08-07 MED ORDER — ONDANSETRON HCL 4 MG PO TABS: 4.0000 mg | ORAL_TABLET | Freq: Four times a day (QID) | ORAL | 0 refills | Status: AC

## 2024-08-07 MED ORDER — IOHEXOL 350 MG/ML SOLN
100.0000 mL | Freq: Once | INTRAVENOUS | Status: AC | PRN
  Administered 2024-08-07: 100 mL via INTRAVENOUS

## 2024-08-07 MED ORDER — ONDANSETRON HCL 4 MG/2ML IJ SOLN
4.0000 mg | Freq: Once | INTRAMUSCULAR | Status: AC
  Administered 2024-08-07: 4 mg via INTRAVENOUS
  Filled 2024-08-07: qty 2

## 2024-08-07 MED ORDER — SUCRALFATE 1 G PO TABS
1.0000 g | ORAL_TABLET | Freq: Once | ORAL | Status: AC
  Administered 2024-08-07: 1 g via ORAL
  Filled 2024-08-07: qty 1

## 2024-08-07 MED ORDER — SODIUM CHLORIDE 0.9% IV SOLUTION: Freq: Once | INTRAVENOUS | Status: DC

## 2024-08-07 MED ORDER — MORPHINE SULFATE (PF) 2 MG/ML IV SOLN: 2.0000 mg | Freq: Once | INTRAVENOUS | Status: DC

## 2024-08-07 MED ORDER — KETOROLAC TROMETHAMINE 30 MG/ML IJ SOLN
30.0000 mg | Freq: Once | INTRAMUSCULAR | Status: AC
  Administered 2024-08-07: 30 mg via INTRAVENOUS
  Filled 2024-08-07: qty 1

## 2024-08-07 MED ORDER — ALUM & MAG HYDROXIDE-SIMETH 200-200-20 MG/5ML PO SUSP
30.0000 mL | Freq: Once | ORAL | Status: AC
  Administered 2024-08-07: 30 mL via ORAL
  Filled 2024-08-07: qty 30

## 2024-08-07 MED ORDER — PANTOPRAZOLE SODIUM 40 MG IV SOLR
80.0000 mg | Freq: Once | INTRAVENOUS | Status: AC
  Administered 2024-08-07: 80 mg via INTRAVENOUS
  Filled 2024-08-07: qty 20

## 2024-08-07 MED ORDER — FAMOTIDINE IN NACL 20-0.9 MG/50ML-% IV SOLN
20.0000 mg | Freq: Once | INTRAVENOUS | Status: AC
  Administered 2024-08-07: 20 mg via INTRAVENOUS
  Filled 2024-08-07: qty 50

## 2024-08-07 NOTE — ED Provider Notes (Signed)
 " [place] EMERGENCY DEPARTMENT AT [facility] Provider Note   CSN: [id number] Arrival date & time: 08/07/24  9157     Patient presents with: Abdominal Pain and Emesis   Tracy [name] is a 59 y.o. female.   HPI   59 year old female presents to the emergency department with concern for abdominal pain, productive cough and emesis.  Patient states this has been ongoing since Friday, a total of 2-1/2 days.  Her last emesis was earlier this morning.  She describes it mainly as yellow/bilious but there has been some hematemesis as well.  She has not had a bowel movement in the last couple days but is still passing gas.  Denies any rectal bleeding.  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  She endorses generalized fatigue and weakness.  Denies any fever.  Denies any daily alcohol use or history of bad reflux/gastritis.  Prior to Admission medications  Medication Sig Start Date End Date Taking? Authorizing Provider  amLODipine -valsartan  (EXFORGE ) 5-160 MG tablet Take 1 tablet by mouth daily. 05/29/24  Yes RuddGarnette HERO, MD  Cyanocobalamin (B-12) 50 MCG TABS Take 1 tablet by mouth every morning. 01/12/24  Yes [provider]  dolutegravir -rilpivirine  (JULUCA ) 50-25 MG tablet Take 1 tablet by mouth daily before lunch. 03/09/24  Yes Melvenia Corean SAILOR, NP  Fezolinetant  45 MG TABS Take 1 tablet (45 mg total) by mouth daily at 10 pm. 05/29/24  Yes Thedora Garnette HERO, MD  hydrOXYzine  (ATARAX ) 25 MG tablet Take 1 tablet (25 mg total) by mouth 3 (three) times daily as needed. Patient taking differently: Take 25 mg by mouth 3 (three) times daily as needed for anxiety. 07/28/24  Yes Izella Ismael NOVAK, MD  lamoTRIgine  (LAMICTAL ) 100 MG tablet Take 1 tablet (100 mg total) by mouth daily. 07/28/24  Yes Izella Ismael NOVAK, MD  QUEtiapine  (SEROQUEL  XR) 300 MG 24 hr tablet Take 2 tablets (600 mg total) by mouth at bedtime. 07/28/24 10/26/24 Yes Izella Ismael NOVAK, MD  rivaroxaban  (XARELTO )  20 MG TABS tablet Take 20 mg by mouth at bedtime. 07/10/20  Yes [provider]  sertraline  (ZOLOFT ) 100 MG tablet Take 1 tablet (100 mg total) by mouth daily. 07/28/24  Yes Izella Ismael NOVAK, MD  Blood Pressure KIT Measure home blood rpessure daily 05/29/24   Thedora Garnette HERO, MD    Allergies: Tetracycline, Glyceryl stear-peg 100 stear, Porcine (pork) protein-containing drug products, Cyclobenzaprine, Moxifloxacin, Tetracyclines & related, and Tramadol    Review of Systems  Constitutional:  Positive for fatigue. Negative for fever.  Respiratory:  Negative for shortness of breath.   Cardiovascular:  Negative for chest pain.  Gastrointestinal:  Positive for abdominal pain, nausea and vomiting. Negative for blood in stool and diarrhea.  Skin:  Negative for rash.  Neurological:  Negative for headaches.    Updated Vital Signs BP 136/82   Pulse 79   Temp 99.4 F (37.4 C) (Oral)   Resp 19   SpO2 95%   Physical Exam Vitals and nursing note reviewed.  Constitutional:      General: She is not in acute distress.    Appearance: Normal appearance.  HENT:     Head: Normocephalic.     Mouth/Throat:     Mouth: Mucous membranes are moist.  Cardiovascular:     Rate and Rhythm: Normal rate.  Pulmonary:     Effort: Pulmonary effort is normal. No respiratory distress.  Abdominal:     General: Bowel sounds are decreased. There  is no distension.     Palpations: Abdomen is soft.     Tenderness: There is generalized abdominal tenderness. There is guarding. There is no rebound.  Skin:    General: Skin is warm.  Neurological:     Mental Status: She is alert and oriented to person, place, and time. Mental status is at baseline.  Psychiatric:        Mood and Affect: Mood normal.     (all labs ordered are listed, but only abnormal results are displayed) Labs Reviewed  CBC - Abnormal; Notable for the following components:      Result Value   RBC 2.47 (*)    Hemoglobin 7.6 (*)    HCT  25.5 (*)    MCV 103.2 (*)    MCHC 29.8 (*)    All other components within normal limits  URINALYSIS, ROUTINE W REFLEX MICROSCOPIC - Abnormal; Notable for the following components:   Color, Urine AMBER (*)    APPearance HAZY (*)    Specific Gravity, Urine 1.038 (*)    Ketones, ur 5 (*)    Protein, ur 100 (*)    Leukocytes,Ua MODERATE (*)    Bacteria, UA RARE (*)    All other components within normal limits  COMPREHENSIVE METABOLIC PANEL WITH GFR - Abnormal; Notable for the following components:   CO2 21 (*)    Calcium 8.7 (*)    All other components within normal limits  LIPASE, BLOOD  POC OCCULT BLOOD, ED  TYPE AND SCREEN    EKG: None  Radiology: CT ANGIO GI BLEED Result Date: 08/07/2024 CLINICAL DATA:  Mesenteric ischemia, acute abdominal pain, productive cough, and emesis. Pt reports she has not been able to keep anything down since Friday. EXAM: CTA ABDOMEN AND PELVIS WITHOUT AND WITH CONTRAST TECHNIQUE: Multidetector CT imaging of the abdomen and pelvis was performed using the standard protocol during bolus administration of intravenous contrast. Multiplanar reconstructed images and MIPs were obtained and reviewed to evaluate the vascular anatomy. RADIATION DOSE REDUCTION: This exam was performed according to the departmental dose-optimization program which includes automated exposure control, adjustment of the mA and/or kV according to patient size and/or use of iterative reconstruction technique. CONTRAST:  OMNIPAQUE  IOHEXOL  350 MG/ML SOLN COMPARISON:  CT CAP, 12/28/2022. FINDINGS: VASCULAR Aorta: Minimal burden of atherosclerosis. Normal caliber aorta without aneurysm, dissection, vasculitis or significant stenosis. Celiac: Patent without evidence of aneurysm, dissection, vasculitis or significant stenosis. SMA: Patent without evidence of aneurysm, dissection, vasculitis or significant stenosis. Renals: 3 renal arteries are present mid and accessory on RIGHT and single LEFT.  All renal arteries are patent without evidence of aneurysm, dissection, vasculitis, fibromuscular dysplasia or significant stenosis. IMA: Patent without evidence of aneurysm, dissection, vasculitis or significant stenosis. Pelvis: Patent without evidence of aneurysm, dissection, vasculitis or significant stenosis. Proximal Outflow: Bilateral common femoral and visualized portions of the superficial and profunda femoral arteries are patent without evidence of aneurysm, dissection, vasculitis or significant stenosis. Veins: No obvious venous abnormality. Review of the MIP images confirms the above findings. NON-VASCULAR Lower chest: Trace pericardial effusion. Minimal linear bibasilar opacities without consolidation. Hepatobiliary: No focal liver abnormality. No gallstones, gallbladder wall thickening, or biliary dilatation. Pancreas: No pancreatic ductal dilatation or surrounding inflammatory changes. Spleen: Normal in size without focal abnormality. Adrenals/Urinary Tract: Adrenal glands are unremarkable. BILATERAL punctate nonobstructing renal calculi. Kidneys are otherwise normal, without focal lesion, or hydronephrosis. Bladder is unremarkable. Stomach/Bowel: Stomach is within normal limits. Appendix appears normal. No evidence of bowel wall  thickening, distention, or inflammatory changes. Lymphatic: No enlarged abdominal or pelvic lymph nodes. Reproductive: Hysterectomy.  No adnexal mass. Other: Small fat-containing umbilical hernia. No abdominopelvic ascites. Musculoskeletal: Lumbosacral fixation.  No acute osseous findings. IMPRESSION: VASCULAR 1. No acute extravasation, as can be seen in active gastrointestinal hemorrhage. 2. Widely patent mesenteric arteries. No CTA evidence of mesenteric ischemia. NON-VASCULAR 1. No acute abdominopelvic process. 2. BILATERAL punctate nonobstructing nephrolithiasis. Additional incidental, chronic and senescent findings as above. Electronically Signed   By: Thom Hall M.D.    On: 08/07/2024 15:00     .Critical Care  Performed by: Bari Roxie HERO, DO Authorized by: Bari Roxie HERO, DO   Critical care provider statement:    Critical care time (minutes):  30   Critical care time was exclusive of:  Separately billable procedures and treating other patients   Critical care was necessary to treat or prevent imminent or life-threatening deterioration of the following conditions:  Metabolic crisis   Critical care was time spent personally by me on the following activities:  Development of treatment plan with patient or surrogate, discussions with consultants, evaluation of patient's response to treatment, examination of patient, ordering and review of laboratory studies, ordering and review of radiographic studies, ordering and performing treatments and interventions, pulse oximetry, re-evaluation of patient's condition and review of old charts   I assumed direction of critical care for this patient from another provider in my specialty: no     Care discussed with: admitting provider      Medications Ordered in the ED  sodium chloride  0.9 % bolus 1,000 mL (0 mLs Intravenous Stopped 08/07/24 1402)  ondansetron  (ZOFRAN ) injection 4 mg (4 mg Intravenous Given 08/07/24 1126)  ketorolac  (TORADOL ) 30 MG/ML injection 30 mg (30 mg Intravenous Given 08/07/24 1126)  famotidine  (PEPCID ) IVPB 20 mg premix (0 mg Intravenous Stopped 08/07/24 1156)  morphine  (PF) 4 MG/ML injection 4 mg (4 mg Intravenous Given 08/07/24 1408)  iohexol  (OMNIPAQUE ) 350 MG/ML injection 100 mL (100 mLs Intravenous Contrast Given 08/07/24 1412)  pantoprazole  (PROTONIX ) injection 80 mg (80 mg Intravenous Given 08/07/24 1459)    Clinical Course as of 08/07/24 1523  Mon Aug 07, 2024  1520 POC occult blood, ED [TY]    Clinical Course User Index [TY] Neysa Caron PARAS, DO                                 Medical Decision Making Amount and/or Complexity of Data Reviewed Labs: ordered. Radiology:  ordered.  Risk Prescription drug management. Decision regarding hospitalization.   59 year old female presents emergency department generalized abdominal pain, hematemesis.  No previous history of upper GI bleed.  Was tachycardic in triage, normal heart rate on my exam, stable blood pressure.  Overall well-appearing.  Abdomen is diffusely tender with voluntary guarding.  Symptomatic IV medicine ordered.  Blood work is concerning for an acute anemia of 7.6, down from a baseline of 13-14.  Otherwise chemistry/abdominal labs are reassuring.  Fecal occult is negative.  CT angio bleed does not show any acute findings including active bleeding/mesenteric ischemia.  After pain medicine abdominal pain is improved.  She has not had any further vomiting today or here.  Patient is symptomatic in regards to the anemia, will plan for transfusion as well as admission and repeat H&H as she is high risk being anticoagulated on Xarelto .  Patients evaluation and results requires admission for further treatment and care.  Spoke  with hospitalist, reviewed patient's ED course and they accept admission.  Patient agrees with admission plan, offers no new complaints and is stable/unchanged at time of admit.     Final diagnoses:  None    ED Discharge Orders     None          Bari Roxie HERO, DO 08/07/24 1524  "

## 2024-08-07 NOTE — Discharge Instructions (Signed)
 You may take the Zofran  and Sucre fate as needed for your symptoms.  Zofran  will help with your nausea.  Sucralfate  may help with some of your discomfort.  Return for fevers, chills, lightheadedness, passout, uncontrolled nausea vomiting, blood in your vomit, blood in your stool, severe abdominal pain or any new or worsening symptoms that are concerning to you.  Please follow-up with your primary doctor soon as possible.

## 2024-08-07 NOTE — Consult Note (Signed)
 " Consult Note   Patient: Tracy [name] FMW:[id number] DOB: [birth year] DOA: 08/07/2024 DOS: the patient was seen and examined on 08/07/2024 PCP: Thedora Garnette HERO, MD  Patient coming from: Home  Chief Complaint:  Chief Complaint  Patient presents with   Abdominal Pain   Emesis    HPI: Tracy [name] is a 59 y.o. female with medical history significant for PE on Xarelto , HIV on Juluca , HTN, GAD, PTSD, MDD, DDD, who presented to the ED on 08/07/2024 complaining of cough, abdominal pain, nausea, and vomiting.  History is obtained from the patient and her husband at bedside.  She reports starting to have URI symptoms on Thursday including cough and runny nose, followed by development of nausea on Friday with decreased p.o. intake.  Since then, she had 4 episodes of vomiting in total, 2 of which reportedly had a very small amount of blood. She has had no further episodes of vomiting since. She also complains of upper abdominal pain that is provoked by coughing and deep inspiration. She has been unable to keep any substantial PO intake down due to nausea since Friday.  She is compliant with Xarelto  and her last dose was on the night of 1/25.  She denies any melena, hematochezia, or hematuria. She denies any fevers, chills, chest pain, shortness of breath, dizziness, lightheadedness, diarrhea, dysuria.  In the ED, she was afebrile with a temperature of 98.7 F, initially tachycardic to 107, which improved to 80s after IVFs and analgesics, RR 18, BP 147/87, SpO2 95% on RA.  Initial CBC showed no leukocytosis with WBC 7.1, Hgb 7.6 (down from 13.35 days ago), platelets 206.  CMP was unremarkable within normal BUN/creatinine ratio.  Lipase 20.  UA showed 5 ketones, moderate protein, moderate leukocytes, 6-10 RBC, 21-50 WBC, rare bacteria, 6-10 squamous epithelial cells.  POCT occult blood negative.  CTA GI bleed showed no acute abdominopelvic processes including no evidence of mesenteric ischemia or active GI  bleed, noted bilateral punctate nonobstructing nephrolithiasis.   Note: repeat H&H was obtained in the ED with a Hgb of 12.1.   Review of Systems: As mentioned in the history of present illness. All other systems reviewed and are negative.  Past Medical History:  Diagnosis Date   Allergy    Anxiety    Arthritis    Chronic back pain    Chronic headaches    Chronic kidney disease    Depression    HIV infection (HCC)    Joint pain    Localized swelling of both lower legs    Legs, feet and hands   Pre-diabetes    Pulmonary embolism (HCC)    on Xarelto    Schizoaffective disorder (HCC)    Seasonal allergies    Stroke (cerebrum) (HCC)    patient denies, states it was a heat stroke   Weight loss    Past Surgical History:  Procedure Laterality Date   ABDOMINAL EXPOSURE N/A 07/27/2018   Procedure: ABDOMINAL EXPOSURE;  Surgeon: Serene Gaile ORN, MD;  Location: [facility] OR;  Service: Vascular;  Laterality: N/A;   ABDOMINAL HYSTERECTOMY     ANTERIOR LUMBAR FUSION N/A 07/27/2018   Procedure: ANTERIOR LUMBAR FUSION L5-S1;  Surgeon: Burnetta Aures, MD;  Location: [facility] OR;  Service: Orthopedics;  Laterality: N/A;  3 hrs/ Dr. Serene to do approach HIV Positive   BREAST EXCISIONAL BIOPSY Right 1990's   TRANSFORAMINAL LUMBAR INTERBODY FUSION (TLIF) WITH PEDICLE SCREW FIXATION 1 LEVEL N/A 10/02/2021   Procedure: TRANSFORAMINAL LUMBAR INTERBODY FUSION LUMBAR FOUR THROUGH  FIVE;  Surgeon: Burnetta Aures, MD;  Location: [facility] OR;  Service: Orthopedics;  Laterality: N/A;   Social History:  reports that she has been smoking cigarettes. She has a 3.8 pack-year smoking history. She has never used smokeless tobacco. She reports current alcohol use of about 4.0 standard drinks of alcohol per week. She reports that she does not currently use drugs after having used the following drugs: Crack cocaine.  Allergies[1]  Family History  Problem Relation Age of Onset   Diabetes Mother    Hypertension Mother     Tremor Mother    Diabetes Sister    High blood pressure Sister    Stroke Maternal Aunt    Breast cancer Maternal Aunt    Colon cancer Maternal Aunt    Cerebral aneurysm Maternal Aunt    High blood pressure Maternal Uncle    Diabetes Maternal Uncle    Rectal cancer Neg Hx    Esophageal cancer Neg Hx    Stomach cancer Neg Hx     Prior to Admission medications  Medication Sig Start Date End Date Taking? Authorizing Provider  amLODipine -valsartan  (EXFORGE ) 5-160 MG tablet Take 1 tablet by mouth daily. 05/29/24  Yes RuddGarnette HERO, MD  Cyanocobalamin (B-12) 50 MCG TABS Take 1 tablet by mouth every morning. 01/12/24  Yes [provider]  dolutegravir -rilpivirine  (JULUCA ) 50-25 MG tablet Take 1 tablet by mouth daily before lunch. 03/09/24  Yes Melvenia Corean SAILOR, NP  Fezolinetant  45 MG TABS Take 1 tablet (45 mg total) by mouth daily at 10 pm. 05/29/24  Yes Thedora Garnette HERO, MD  hydrOXYzine  (ATARAX ) 25 MG tablet Take 1 tablet (25 mg total) by mouth 3 (three) times daily as needed. Patient taking differently: Take 25 mg by mouth 3 (three) times daily as needed for anxiety. 07/28/24  Yes Izella Ismael NOVAK, MD  lamoTRIgine  (LAMICTAL ) 100 MG tablet Take 1 tablet (100 mg total) by mouth daily. 07/28/24  Yes Izella Ismael NOVAK, MD  QUEtiapine  (SEROQUEL  XR) 300 MG 24 hr tablet Take 2 tablets (600 mg total) by mouth at bedtime. 07/28/24 10/26/24 Yes Izella Ismael NOVAK, MD  rivaroxaban  (XARELTO ) 20 MG TABS tablet Take 20 mg by mouth at bedtime. 07/10/20  Yes [provider]  sertraline  (ZOLOFT ) 100 MG tablet Take 1 tablet (100 mg total) by mouth daily. 07/28/24  Yes Izella Ismael NOVAK, MD  Blood Pressure KIT Measure home blood rpessure daily 05/29/24   Thedora Garnette HERO, MD    Physical Exam: Vitals:   08/07/24 1330 08/07/24 1345 08/07/24 1400 08/07/24 1539  BP:   136/82 (!) 144/79  Pulse: 78 78 79 74  Resp:   19 (!) 94  Temp:    98.6 F (37 C)  TempSrc:    Oral  SpO2: 95% 96% 95% 94%   Gen:  NAD, A&Ox3 HEENT: NCAT, EOMI Neck: Supple, no JVD CV: RRR, no murmurs Resp: normal WOB, CTAB, no w/r/r Abd: Soft, tenderness to palpation of upper abdomen, BS normoactive Ext: No LE edema, pulses 2+ b/l Skin: Warm, dry Neuro: No focal deficits Psych: Calm, cooperative, appropriate affect   Labs on Admission: I have personally reviewed following labs and imaging studies  CBC: Recent Labs  Lab 08/02/24 0849 08/07/24 1115  WBC 4.7 7.1  NEUTROABS 2.3  --   HGB 13.3 7.6*  HCT 40.4 25.5*  MCV 93 103.2*  PLT 243 206   Basic Metabolic Panel: Recent Labs  Lab 08/02/24 0849 08/07/24 1225  NA 144 141  K  4.4 3.8  CL 110* 107  CO2 CANCELED 21*  GLUCOSE 78 83  BUN 14 8  CREATININE 0.97 0.92  CALCIUM 9.3 8.7*   GFR: CrCl cannot be calculated (Unknown ideal weight.). Liver Function Tests: Recent Labs  Lab 08/02/24 0849 08/07/24 1225  AST 20 33  ALT 12 16  ALKPHOS 66 57  BILITOT 0.4 0.3  PROT 6.8 6.7  ALBUMIN 4.5 4.1   Recent Labs  Lab 08/07/24 1225  LIPASE 20   No results for input(s): AMMONIA in the last 168 hours. Coagulation Profile: No results for input(s): INR, PROTIME in the last 168 hours. Cardiac Enzymes: No results for input(s): CKTOTAL, CKMB, CKMBINDEX, TROPONINI in the last 168 hours. BNP (last 3 results) No results for input(s): PROBNP in the last 8760 hours. HbA1C: No results for input(s): HGBA1C in the last 72 hours. CBG: No results for input(s): GLUCAP in the last 168 hours. Lipid Profile: No results for input(s): CHOL, HDL, LDLCALC, TRIG, CHOLHDL, LDLDIRECT in the last 72 hours. Thyroid  Function Tests: No results for input(s): TSH, T4TOTAL, FREET4, T3FREE, THYROIDAB in the last 72 hours. Anemia Panel: No results for input(s): VITAMINB12, FOLATE, FERRITIN, TIBC, IRON, RETICCTPCT in the last 72 hours. Urine analysis:    Component Value Date/Time   COLORURINE AMBER (A) 08/07/2024 0917    APPEARANCEUR HAZY (A) 08/07/2024 0917   LABSPEC 1.038 (H) 08/07/2024 0917   PHURINE 5.0 08/07/2024 0917   GLUCOSEU NEGATIVE 08/07/2024 0917   HGBUR NEGATIVE 08/07/2024 0917   BILIRUBINUR NEGATIVE 08/07/2024 0917   KETONESUR 5 (A) 08/07/2024 0917   PROTEINUR 100 (A) 08/07/2024 0917   NITRITE NEGATIVE 08/07/2024 0917   LEUKOCYTESUR MODERATE (A) 08/07/2024 0917    Radiological Exams on Admission: CT ANGIO GI BLEED Result Date: 08/07/2024 CLINICAL DATA:  Mesenteric ischemia, acute abdominal pain, productive cough, and emesis. Pt reports she has not been able to keep anything down since Friday. EXAM: CTA ABDOMEN AND PELVIS WITHOUT AND WITH CONTRAST TECHNIQUE: Multidetector CT imaging of the abdomen and pelvis was performed using the standard protocol during bolus administration of intravenous contrast. Multiplanar reconstructed images and MIPs were obtained and reviewed to evaluate the vascular anatomy. RADIATION DOSE REDUCTION: This exam was performed according to the departmental dose-optimization program which includes automated exposure control, adjustment of the mA and/or kV according to patient size and/or use of iterative reconstruction technique. CONTRAST:  OMNIPAQUE  IOHEXOL  350 MG/ML SOLN COMPARISON:  CT CAP, 12/28/2022. FINDINGS: VASCULAR Aorta: Minimal burden of atherosclerosis. Normal caliber aorta without aneurysm, dissection, vasculitis or significant stenosis. Celiac: Patent without evidence of aneurysm, dissection, vasculitis or significant stenosis. SMA: Patent without evidence of aneurysm, dissection, vasculitis or significant stenosis. Renals: 3 renal arteries are present mid and accessory on RIGHT and single LEFT. All renal arteries are patent without evidence of aneurysm, dissection, vasculitis, fibromuscular dysplasia or significant stenosis. IMA: Patent without evidence of aneurysm, dissection, vasculitis or significant stenosis. Pelvis: Patent without evidence of aneurysm,  dissection, vasculitis or significant stenosis. Proximal Outflow: Bilateral common femoral and visualized portions of the superficial and profunda femoral arteries are patent without evidence of aneurysm, dissection, vasculitis or significant stenosis. Veins: No obvious venous abnormality. Review of the MIP images confirms the above findings. NON-VASCULAR Lower chest: Trace pericardial effusion. Minimal linear bibasilar opacities without consolidation. Hepatobiliary: No focal liver abnormality. No gallstones, gallbladder wall thickening, or biliary dilatation. Pancreas: No pancreatic ductal dilatation or surrounding inflammatory changes. Spleen: Normal in size without focal abnormality. Adrenals/Urinary Tract: Adrenal glands are unremarkable. BILATERAL  punctate nonobstructing renal calculi. Kidneys are otherwise normal, without focal lesion, or hydronephrosis. Bladder is unremarkable. Stomach/Bowel: Stomach is within normal limits. Appendix appears normal. No evidence of bowel wall thickening, distention, or inflammatory changes. Lymphatic: No enlarged abdominal or pelvic lymph nodes. Reproductive: Hysterectomy.  No adnexal mass. Other: Small fat-containing umbilical hernia. No abdominopelvic ascites. Musculoskeletal: Lumbosacral fixation.  No acute osseous findings. IMPRESSION: VASCULAR 1. No acute extravasation, as can be seen in active gastrointestinal hemorrhage. 2. Widely patent mesenteric arteries. No CTA evidence of mesenteric ischemia. NON-VASCULAR 1. No acute abdominopelvic process. 2. BILATERAL punctate nonobstructing nephrolithiasis. Additional incidental, chronic and senescent findings as above. Electronically Signed   By: Thom Hall M.D.   On: 08/07/2024 15:00   Data Reviewed: Relevant notes from primary care and specialist visits, past discharge summaries as available in EHR, including Care Everywhere. Prior diagnostic testing as pertinent to current admission diagnoses Updated medications and  problem lists for reconciliation ED course, including vitals, labs, imaging, treatment and response to treatment Triage notes, nursing and pharmacy notes and ED provider's notes Notable results as noted above in HPI   Assessment and Plan:  # Nausea and vomiting # Upper abdominal pain - Patient presented with symptoms c/w viral URI starting on Thursday including runny nose and cough with development of nausea on Friday and subsequently four episodes of vomiting since, two of which had  very small amount of blood in them.  - Lipase and LFTs wnl  - CTA GI bleed showed no evidence of acute abdominopelvic processes - Nausea improved with Zofran  in the ED - Recommend zofran  on discharge. Patient can follow up with PCP if symptoms continue or fail to improve  # Possible anemia - Initial Hgb 7.6, down from 13.3 on outpatient labs 5 days prior - This appeared to be a lab error given no significant or convincing source of bleed at this time with a negative FOBT, negative CTA GI bleed, and no history of substantial bleeding - Discussed with EDP, recommended repeating H&H to confirm accuracy of initial Hgb and obtaining an anemia panel prior to transfusion. - Hgb 12.1 on repeat, appears much more consistent with patient's baseline. Patient is hemodynamically stable without any significant symptoms related to anemia/blood loss, with fatigue likely explained by patient's decreased PO intake and viral URI symptoms of the last 4 days  # Viral URI - Reported symptoms of runny nose and cough starting on Thursday, without fevers or chills. Cough is productive of yellow sputum.  - Viral panel not checked on admission, currently out 48 hour window to start tamiflu anyway - Symptomatic conservative management   Family Communication: discussed with husband at bedside   Author: Duffy Larch, MD 08/07/2024 4:13 PM  For on call review [email or web address].      [1]  Allergies Allergen Reactions    Tetracycline Rash   Glyceryl Stear-Peg 100 Stear Other (See Comments)   Porcine (Pork) Protein-Containing Drug Products Other (See Comments)    Religious practice   Cyclobenzaprine Rash and Itching   Moxifloxacin Rash   Tetracyclines & Related Itching and Rash   Tramadol Itching and Rash   "

## 2024-08-07 NOTE — ED Provider Notes (Signed)
 Clinical Course as of 08/07/24 1859  Mon Aug 07, 2024  1520 POC occult blood, ED [TY]  1857 Received signout.  Dispo pending repeat H&H.  Possible GI bleeding on Xarelto .  Hospitalist to evaluate patient.  [TY]  1857 Repeat H&H came back with a hemoglobin of 12.1.  Likely lab error for hemoglobin of 7.6.  No further episodes of vomiting here in the emergency department.  It is feeling much improved after the Zofran  tolerating p.o. well.  Hospitalist recommend discharge.  Patient is also requesting discharge as well.  Given stable vitals and H&H normal with negative CTA will discharge in stable condition.  Given strict return precautions. [TY]    Clinical Course User Index [TY] Neysa Caron PARAS, DO      Neysa Caron PARAS, [place] 08/07/24 1859

## 2024-08-07 NOTE — ED Triage Notes (Signed)
 Pt arriving for abdominal pain, productive cough, and emesis. Pt reports she has not been able to keep anything down since Friday.

## 2024-08-07 NOTE — ED Notes (Signed)
 Pt ambulatory to the restroom w/o complaint.

## 2024-08-07 NOTE — ED Notes (Signed)
 More pain meds requested at pts request.

## 2024-08-21 ENCOUNTER — Ambulatory Visit (HOSPITAL_COMMUNITY): Payer: MEDICAID | Admitting: Mental Health

## 2024-08-22 ENCOUNTER — Ambulatory Visit: Payer: MEDICAID | Admitting: Infectious Diseases

## 2024-09-04 ENCOUNTER — Ambulatory Visit (HOSPITAL_COMMUNITY): Payer: MEDICAID | Admitting: Mental Health

## 2024-09-28 ENCOUNTER — Ambulatory Visit: Payer: MEDICAID | Admitting: Family Medicine

## 2024-09-29 ENCOUNTER — Telehealth (HOSPITAL_COMMUNITY): Payer: MEDICAID | Admitting: Psychiatry
